# Patient Record
Sex: Male | Born: 1940 | Race: Black or African American | Hispanic: No | Smoking: Former smoker
Health system: Southern US, Community
[De-identification: ages and names within clinical notes are randomized; demographics above are authoritative.]

## PROBLEM LIST (undated history)

## (undated) DIAGNOSIS — R0789 Other chest pain: Secondary | ICD-10-CM

## (undated) DIAGNOSIS — I2510 Atherosclerotic heart disease of native coronary artery without angina pectoris: Secondary | ICD-10-CM

## (undated) DIAGNOSIS — J439 Emphysema, unspecified: Secondary | ICD-10-CM

## (undated) DIAGNOSIS — T7840XA Allergy, unspecified, initial encounter: Secondary | ICD-10-CM

## (undated) DIAGNOSIS — Z92241 Personal history of systemic steroid therapy: Secondary | ICD-10-CM

## (undated) DIAGNOSIS — E785 Hyperlipidemia, unspecified: Secondary | ICD-10-CM

## (undated) DIAGNOSIS — M069 Rheumatoid arthritis, unspecified: Secondary | ICD-10-CM

## (undated) DIAGNOSIS — R002 Palpitations: Secondary | ICD-10-CM

## (undated) DIAGNOSIS — Z5189 Encounter for other specified aftercare: Secondary | ICD-10-CM

## (undated) DIAGNOSIS — M1990 Unspecified osteoarthritis, unspecified site: Secondary | ICD-10-CM

## (undated) DIAGNOSIS — U071 COVID-19: Secondary | ICD-10-CM

## (undated) DIAGNOSIS — D649 Anemia, unspecified: Secondary | ICD-10-CM

## (undated) DIAGNOSIS — K219 Gastro-esophageal reflux disease without esophagitis: Secondary | ICD-10-CM

## (undated) DIAGNOSIS — R0989 Other specified symptoms and signs involving the circulatory and respiratory systems: Secondary | ICD-10-CM

## (undated) DIAGNOSIS — I5189 Other ill-defined heart diseases: Secondary | ICD-10-CM

## (undated) HISTORY — DX: Other specified symptoms and signs involving the circulatory and respiratory systems: R09.89

## (undated) HISTORY — DX: Encounter for other specified aftercare: Z51.89

## (undated) HISTORY — DX: Palpitations: R00.2

## (undated) HISTORY — PX: CARPAL TUNNEL RELEASE: SHX101

## (undated) HISTORY — PX: CATARACT EXTRACTION: SUR2

## (undated) HISTORY — DX: Atherosclerotic heart disease of native coronary artery without angina pectoris: I25.10

## (undated) HISTORY — DX: Anemia, unspecified: D64.9

## (undated) HISTORY — DX: Emphysema, unspecified: J43.9

## (undated) HISTORY — DX: Allergy, unspecified, initial encounter: T78.40XA

## (undated) HISTORY — PX: SPINE SURGERY: SHX786

## (undated) HISTORY — DX: Other ill-defined heart diseases: I51.89

## (undated) HISTORY — PX: POLYPECTOMY: SHX149

## (undated) HISTORY — DX: Personal history of systemic steroid therapy: Z92.241

## (undated) HISTORY — PX: HERNIA REPAIR: SHX51

## (undated) HISTORY — PX: COLOSTOMY REVERSAL: SHX5782

## (undated) HISTORY — PX: ANTERIOR CERVICAL CORPECTOMY: SHX1159

## (undated) HISTORY — PX: COLONOSCOPY: SHX174

## (undated) HISTORY — DX: Unspecified osteoarthritis, unspecified site: M19.90

## (undated) HISTORY — DX: Gastro-esophageal reflux disease without esophagitis: K21.9

## (undated) HISTORY — DX: Rheumatoid arthritis, unspecified: M06.9

## (undated) HISTORY — DX: COVID-19: U07.1

## (undated) HISTORY — DX: Hyperlipidemia, unspecified: E78.5

## (undated) HISTORY — DX: Other chest pain: R07.89

---

## 2014-04-08 DIAGNOSIS — L918 Other hypertrophic disorders of the skin: Secondary | ICD-10-CM | POA: Diagnosis not present

## 2014-05-25 DIAGNOSIS — M79609 Pain in unspecified limb: Secondary | ICD-10-CM | POA: Diagnosis not present

## 2014-05-25 DIAGNOSIS — M25519 Pain in unspecified shoulder: Secondary | ICD-10-CM | POA: Diagnosis not present

## 2014-05-25 DIAGNOSIS — R059 Cough, unspecified: Secondary | ICD-10-CM | POA: Diagnosis not present

## 2014-05-25 DIAGNOSIS — R05 Cough: Secondary | ICD-10-CM | POA: Diagnosis not present

## 2014-05-25 DIAGNOSIS — I10 Essential (primary) hypertension: Secondary | ICD-10-CM | POA: Diagnosis not present

## 2014-06-07 DIAGNOSIS — R05 Cough: Secondary | ICD-10-CM | POA: Diagnosis not present

## 2014-06-07 DIAGNOSIS — R059 Cough, unspecified: Secondary | ICD-10-CM | POA: Diagnosis not present

## 2014-06-07 DIAGNOSIS — M79609 Pain in unspecified limb: Secondary | ICD-10-CM | POA: Diagnosis not present

## 2014-06-07 DIAGNOSIS — I10 Essential (primary) hypertension: Secondary | ICD-10-CM | POA: Diagnosis not present

## 2014-06-07 DIAGNOSIS — M25519 Pain in unspecified shoulder: Secondary | ICD-10-CM | POA: Diagnosis not present

## 2014-06-10 DIAGNOSIS — IMO0002 Reserved for concepts with insufficient information to code with codable children: Secondary | ICD-10-CM | POA: Diagnosis not present

## 2014-06-10 DIAGNOSIS — S71009A Unspecified open wound, unspecified hip, initial encounter: Secondary | ICD-10-CM | POA: Diagnosis not present

## 2014-06-21 DIAGNOSIS — Z7401 Bed confinement status: Secondary | ICD-10-CM | POA: Diagnosis not present

## 2014-06-21 DIAGNOSIS — K9403 Colostomy malfunction: Secondary | ICD-10-CM | POA: Diagnosis not present

## 2014-06-21 DIAGNOSIS — K9413 Enterostomy malfunction: Secondary | ICD-10-CM | POA: Diagnosis not present

## 2014-06-22 DIAGNOSIS — M79609 Pain in unspecified limb: Secondary | ICD-10-CM | POA: Diagnosis not present

## 2014-09-16 DIAGNOSIS — Z9181 History of falling: Secondary | ICD-10-CM | POA: Diagnosis not present

## 2014-09-16 DIAGNOSIS — M5000 Cervical disc disorder with myelopathy, unspecified cervical region: Secondary | ICD-10-CM | POA: Diagnosis not present

## 2014-09-16 DIAGNOSIS — R2689 Other abnormalities of gait and mobility: Secondary | ICD-10-CM | POA: Diagnosis not present

## 2014-09-16 DIAGNOSIS — A047 Enterocolitis due to Clostridium difficile: Secondary | ICD-10-CM | POA: Diagnosis not present

## 2014-09-16 DIAGNOSIS — Z4889 Encounter for other specified surgical aftercare: Secondary | ICD-10-CM | POA: Diagnosis not present

## 2014-09-16 DIAGNOSIS — Z433 Encounter for attention to colostomy: Secondary | ICD-10-CM | POA: Diagnosis not present

## 2014-09-21 DIAGNOSIS — R2689 Other abnormalities of gait and mobility: Secondary | ICD-10-CM | POA: Diagnosis not present

## 2014-09-21 DIAGNOSIS — M5000 Cervical disc disorder with myelopathy, unspecified cervical region: Secondary | ICD-10-CM | POA: Diagnosis not present

## 2014-09-21 DIAGNOSIS — Z4889 Encounter for other specified surgical aftercare: Secondary | ICD-10-CM | POA: Diagnosis not present

## 2014-09-21 DIAGNOSIS — A047 Enterocolitis due to Clostridium difficile: Secondary | ICD-10-CM | POA: Diagnosis not present

## 2014-09-21 DIAGNOSIS — Z433 Encounter for attention to colostomy: Secondary | ICD-10-CM | POA: Diagnosis not present

## 2014-09-21 DIAGNOSIS — Z9181 History of falling: Secondary | ICD-10-CM | POA: Diagnosis not present

## 2014-09-28 DIAGNOSIS — Z433 Encounter for attention to colostomy: Secondary | ICD-10-CM | POA: Diagnosis not present

## 2014-09-28 DIAGNOSIS — Z4889 Encounter for other specified surgical aftercare: Secondary | ICD-10-CM | POA: Diagnosis not present

## 2014-09-28 DIAGNOSIS — R2689 Other abnormalities of gait and mobility: Secondary | ICD-10-CM | POA: Diagnosis not present

## 2014-09-28 DIAGNOSIS — M5000 Cervical disc disorder with myelopathy, unspecified cervical region: Secondary | ICD-10-CM | POA: Diagnosis not present

## 2014-09-28 DIAGNOSIS — Z9181 History of falling: Secondary | ICD-10-CM | POA: Diagnosis not present

## 2014-09-28 DIAGNOSIS — A047 Enterocolitis due to Clostridium difficile: Secondary | ICD-10-CM | POA: Diagnosis not present

## 2014-09-30 DIAGNOSIS — A047 Enterocolitis due to Clostridium difficile: Secondary | ICD-10-CM | POA: Diagnosis not present

## 2014-09-30 DIAGNOSIS — Z9181 History of falling: Secondary | ICD-10-CM | POA: Diagnosis not present

## 2014-09-30 DIAGNOSIS — R2689 Other abnormalities of gait and mobility: Secondary | ICD-10-CM | POA: Diagnosis not present

## 2014-09-30 DIAGNOSIS — Z4889 Encounter for other specified surgical aftercare: Secondary | ICD-10-CM | POA: Diagnosis not present

## 2014-09-30 DIAGNOSIS — Z433 Encounter for attention to colostomy: Secondary | ICD-10-CM | POA: Diagnosis not present

## 2014-09-30 DIAGNOSIS — M5000 Cervical disc disorder with myelopathy, unspecified cervical region: Secondary | ICD-10-CM | POA: Diagnosis not present

## 2014-10-07 DIAGNOSIS — M4800 Spinal stenosis, site unspecified: Secondary | ICD-10-CM | POA: Diagnosis not present

## 2014-10-07 DIAGNOSIS — Z933 Colostomy status: Secondary | ICD-10-CM | POA: Diagnosis not present

## 2014-10-07 DIAGNOSIS — Z1211 Encounter for screening for malignant neoplasm of colon: Secondary | ICD-10-CM | POA: Diagnosis not present

## 2014-10-07 DIAGNOSIS — K621 Rectal polyp: Secondary | ICD-10-CM | POA: Diagnosis not present

## 2014-10-07 DIAGNOSIS — Z01818 Encounter for other preprocedural examination: Secondary | ICD-10-CM | POA: Diagnosis not present

## 2014-10-07 DIAGNOSIS — K625 Hemorrhage of anus and rectum: Secondary | ICD-10-CM | POA: Diagnosis not present

## 2014-10-07 DIAGNOSIS — K5730 Diverticulosis of large intestine without perforation or abscess without bleeding: Secondary | ICD-10-CM | POA: Diagnosis not present

## 2014-10-07 DIAGNOSIS — K5790 Diverticulosis of intestine, part unspecified, without perforation or abscess without bleeding: Secondary | ICD-10-CM | POA: Diagnosis not present

## 2014-10-07 LAB — HM COLONOSCOPY: HM Colonoscopy: NORMAL

## 2014-10-20 DIAGNOSIS — K550 Acute vascular disorders of intestine: Secondary | ICD-10-CM | POA: Diagnosis not present

## 2014-10-27 DIAGNOSIS — K5730 Diverticulosis of large intestine without perforation or abscess without bleeding: Secondary | ICD-10-CM | POA: Diagnosis not present

## 2014-10-27 DIAGNOSIS — Z23 Encounter for immunization: Secondary | ICD-10-CM | POA: Diagnosis not present

## 2014-10-27 DIAGNOSIS — Z433 Encounter for attention to colostomy: Secondary | ICD-10-CM | POA: Diagnosis not present

## 2014-10-27 DIAGNOSIS — D638 Anemia in other chronic diseases classified elsewhere: Secondary | ICD-10-CM | POA: Diagnosis present

## 2014-10-27 DIAGNOSIS — Z87891 Personal history of nicotine dependence: Secondary | ICD-10-CM | POA: Diagnosis not present

## 2014-10-27 DIAGNOSIS — M1990 Unspecified osteoarthritis, unspecified site: Secondary | ICD-10-CM | POA: Diagnosis present

## 2014-10-27 DIAGNOSIS — K9403 Colostomy malfunction: Secondary | ICD-10-CM | POA: Diagnosis not present

## 2014-10-27 DIAGNOSIS — G8918 Other acute postprocedural pain: Secondary | ICD-10-CM | POA: Diagnosis not present

## 2014-11-09 DIAGNOSIS — G8220 Paraplegia, unspecified: Secondary | ICD-10-CM | POA: Diagnosis not present

## 2014-11-09 DIAGNOSIS — M4712 Other spondylosis with myelopathy, cervical region: Secondary | ICD-10-CM | POA: Diagnosis not present

## 2014-11-09 DIAGNOSIS — M4802 Spinal stenosis, cervical region: Secondary | ICD-10-CM | POA: Diagnosis not present

## 2014-11-09 DIAGNOSIS — G959 Disease of spinal cord, unspecified: Secondary | ICD-10-CM | POA: Diagnosis not present

## 2014-12-19 DIAGNOSIS — M79642 Pain in left hand: Secondary | ICD-10-CM | POA: Diagnosis not present

## 2014-12-19 DIAGNOSIS — R030 Elevated blood-pressure reading, without diagnosis of hypertension: Secondary | ICD-10-CM | POA: Diagnosis not present

## 2014-12-19 DIAGNOSIS — M79641 Pain in right hand: Secondary | ICD-10-CM | POA: Diagnosis not present

## 2014-12-19 DIAGNOSIS — M25512 Pain in left shoulder: Secondary | ICD-10-CM | POA: Diagnosis not present

## 2014-12-23 HISTORY — PX: ANTERIOR CERVICAL CORPECTOMY: SHX1159

## 2015-01-20 DIAGNOSIS — M4712 Other spondylosis with myelopathy, cervical region: Secondary | ICD-10-CM | POA: Diagnosis not present

## 2015-01-20 DIAGNOSIS — M4802 Spinal stenosis, cervical region: Secondary | ICD-10-CM | POA: Diagnosis not present

## 2015-01-20 DIAGNOSIS — G8220 Paraplegia, unspecified: Secondary | ICD-10-CM | POA: Diagnosis not present

## 2015-01-20 DIAGNOSIS — G959 Disease of spinal cord, unspecified: Secondary | ICD-10-CM | POA: Diagnosis not present

## 2015-08-10 ENCOUNTER — Emergency Department (HOSPITAL_COMMUNITY)
Admission: EM | Admit: 2015-08-10 | Discharge: 2015-08-10 | Disposition: A | Discharge status: Home / Self Care | Payer: No Typology Code available for payment source | Attending: Emergency Medicine | Admitting: Emergency Medicine

## 2015-08-10 ENCOUNTER — Encounter (HOSPITAL_COMMUNITY): Payer: Self-pay

## 2015-08-10 DIAGNOSIS — Y998 Other external cause status: Secondary | ICD-10-CM | POA: Insufficient documentation

## 2015-08-10 DIAGNOSIS — Y92410 Unspecified street and highway as the place of occurrence of the external cause: Secondary | ICD-10-CM | POA: Insufficient documentation

## 2015-08-10 DIAGNOSIS — M25512 Pain in left shoulder: Secondary | ICD-10-CM | POA: Insufficient documentation

## 2015-08-10 DIAGNOSIS — Y9389 Activity, other specified: Secondary | ICD-10-CM | POA: Insufficient documentation

## 2015-08-10 DIAGNOSIS — Z87891 Personal history of nicotine dependence: Secondary | ICD-10-CM | POA: Insufficient documentation

## 2015-08-10 MED ORDER — ACETAMINOPHEN 500 MG PO TABS: 500.0000 mg | ORAL_TABLET | Freq: Four times a day (QID) | ORAL | Status: DC | PRN

## 2015-08-10 MED ORDER — ACETAMINOPHEN 325 MG PO TABS
650.0000 mg | ORAL_TABLET | Freq: Once | ORAL | Status: AC
  Administered 2015-08-10: 650 mg via ORAL
  Filled 2015-08-10: qty 2
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
## 2015-08-10 NOTE — Discharge Instructions (Signed)

## 2015-10-26 DIAGNOSIS — Z23 Encounter for immunization: Secondary | ICD-10-CM | POA: Diagnosis not present

## 2015-11-09 ENCOUNTER — Ambulatory Visit: Payer: Self-pay | Admitting: Medical

## 2015-11-14 ENCOUNTER — Ambulatory Visit: Payer: Self-pay | Admitting: Medical

## 2015-11-29 ENCOUNTER — Encounter: Payer: Self-pay | Admitting: Family Medicine

## 2015-11-29 ENCOUNTER — Ambulatory Visit (INDEPENDENT_AMBULATORY_CARE_PROVIDER_SITE_OTHER): Payer: Medicare Other | Admitting: Family Medicine

## 2015-11-29 VITALS — BP 138/80 | HR 68 | Ht 62.25 in | Wt 147.4 lb

## 2015-11-29 DIAGNOSIS — N529 Male erectile dysfunction, unspecified: Secondary | ICD-10-CM

## 2015-11-29 DIAGNOSIS — R350 Frequency of micturition: Secondary | ICD-10-CM

## 2015-11-29 DIAGNOSIS — Z7189 Other specified counseling: Secondary | ICD-10-CM

## 2015-11-29 DIAGNOSIS — Z7689 Persons encountering health services in other specified circumstances: Secondary | ICD-10-CM

## 2015-11-29 LAB — POCT URINALYSIS DIPSTICK
Bilirubin, UA: NEGATIVE
Blood, UA: NEGATIVE
Glucose, UA: NEGATIVE
Ketones, UA: NEGATIVE
Leukocytes, UA: NEGATIVE
Nitrite, UA: NEGATIVE
Spec Grav, UA: 1.025
Urobilinogen, UA: NEGATIVE
pH, UA: 6

## 2015-11-29 NOTE — Progress Notes (Signed)
   Subjective:    Patient ID: Tony Mcmillan, male    DOB: 1941/01/12, 74 y.o.   MRN: [id number]  HPI Chief Complaint  Patient presents with  . new pt    new pt get established   He is here today to establish primary care. States he moved here from [place] 6 months. Has not had primary care provider in several years and no physical exam or labs in years. He has had recent cervical surgery in past year and is ambulating with a cane. 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 He then had a colostomy reversal. Denies any problems with his bowels. He did not bring medical records regarding this. Retired from [employer] last year. He lives alone. Drove himself here.   Concerns/complaints today: denies any concerns. After going through ROS he endorses that he urinates "a lot" 2-3 times per night for at least 2 years, no hesitency, urgency, normal stream, emptiess bladder ok. Has not gotten an erection in a few years. Denies history of prostate issues but states has not been checked that he knows of.   Denies fever, chills, loss, fatigue, headaches, dizziness, chest pain, DOE.   Health Maintenance: Colonoscopy: states he had last year and was normal, no medical records.  PSA: unknown.  Eyes: wears eyeglasses, had laser eye surgery. 2 years, overdue.  Dentist: upper and lower dentures- need a dentist Hearing good per patient  Former smoker, denies alcohol or drug use.   Immunizations: flu shot last month Pneumonia: had one last year but not sure which one he got.  Tdap: unknown Zostavax: never  Reviewed allergies, medications, past medical, surgical and social history.   Review of Systems Pertinent positives and negatives in the history of present illness.    Objective:   Physical Exam BP 138/80 mmHg  Pulse 68  Ht 5' 2.25" (1.581 m)  Wt 147 lb 6.4 oz (66.86 kg)  BMI 26.75 kg/m2 Alert, oriented, in no acute  distress. Not otherwise examined.   Urinalysis dipstick trace protein otherwise normal     Assessment & Plan:  Urinary frequency - Plan: POCT urinalysis dipstick  Encounter to establish care  Erectile dysfunction, unspecified erectile dysfunction type  Reviewed orthopedic medical records regarding cervical surgery. No other medical records available. Recommend that he return for a med check plus and annual wellness visit. He is outside the window for [place]. Discussed that he needs a physical exam and fasting labs.  He will need prescription for Zostavax and Tdap at next appointment.  Unsure which pneumonia shot he had last year.

## 2015-12-05 ENCOUNTER — Ambulatory Visit (INDEPENDENT_AMBULATORY_CARE_PROVIDER_SITE_OTHER): Payer: Medicare Other | Admitting: Family Medicine

## 2015-12-05 ENCOUNTER — Encounter: Payer: Self-pay | Admitting: Family Medicine

## 2015-12-05 VITALS — BP 118/70 | HR 72 | Ht 62.0 in | Wt 147.6 lb

## 2015-12-05 DIAGNOSIS — Z125 Encounter for screening for malignant neoplasm of prostate: Secondary | ICD-10-CM

## 2015-12-05 DIAGNOSIS — Z9889 Other specified postprocedural states: Secondary | ICD-10-CM | POA: Diagnosis not present

## 2015-12-05 DIAGNOSIS — N509 Disorder of male genital organs, unspecified: Secondary | ICD-10-CM | POA: Diagnosis not present

## 2015-12-05 DIAGNOSIS — Z0000 Encounter for general adult medical examination without abnormal findings: Secondary | ICD-10-CM | POA: Diagnosis not present

## 2015-12-05 DIAGNOSIS — K5900 Constipation, unspecified: Secondary | ICD-10-CM | POA: Diagnosis not present

## 2015-12-05 DIAGNOSIS — R05 Cough: Secondary | ICD-10-CM

## 2015-12-05 DIAGNOSIS — R195 Other fecal abnormalities: Secondary | ICD-10-CM | POA: Diagnosis not present

## 2015-12-05 DIAGNOSIS — R350 Frequency of micturition: Secondary | ICD-10-CM

## 2015-12-05 DIAGNOSIS — R5383 Other fatigue: Secondary | ICD-10-CM | POA: Diagnosis not present

## 2015-12-05 DIAGNOSIS — J069 Acute upper respiratory infection, unspecified: Secondary | ICD-10-CM | POA: Diagnosis not present

## 2015-12-05 DIAGNOSIS — IMO0002 Reserved for concepts with insufficient information to code with codable children: Secondary | ICD-10-CM

## 2015-12-05 DIAGNOSIS — R059 Cough, unspecified: Secondary | ICD-10-CM

## 2015-12-05 DIAGNOSIS — Z23 Encounter for immunization: Secondary | ICD-10-CM

## 2015-12-05 LAB — CBC WITH DIFFERENTIAL/PLATELET
Basophils Absolute: 0.1 10*3/uL (ref 0.0–0.1)
Basophils Relative: 1 % (ref 0–1)
Eosinophils Absolute: 0.1 10*3/uL (ref 0.0–0.7)
Eosinophils Relative: 2 % (ref 0–5)
HCT: 37.9 % — ABNORMAL LOW (ref 39.0–52.0)
Hemoglobin: 12.4 g/dL — ABNORMAL LOW (ref 13.0–17.0)
Lymphocytes Relative: 21 % (ref 12–46)
Lymphs Abs: 1.1 10*3/uL (ref 0.7–4.0)
MCH: 23.8 pg — ABNORMAL LOW (ref 26.0–34.0)
MCHC: 32.7 g/dL (ref 30.0–36.0)
MCV: 72.7 fL — ABNORMAL LOW (ref 78.0–100.0)
MPV: 9.1 fL (ref 8.6–12.4)
Monocytes Absolute: 0.7 10*3/uL (ref 0.1–1.0)
Monocytes Relative: 13 % — ABNORMAL HIGH (ref 3–12)
Neutro Abs: 3.4 10*3/uL (ref 1.7–7.7)
Neutrophils Relative %: 63 % (ref 43–77)
Platelets: 351 10*3/uL (ref 150–400)
RBC: 5.21 MIL/uL (ref 4.22–5.81)
RDW: 18.2 % — ABNORMAL HIGH (ref 11.5–15.5)
WBC: 5.4 10*3/uL (ref 4.0–10.5)

## 2015-12-05 LAB — COMPREHENSIVE METABOLIC PANEL
ALT: 27 U/L (ref 9–46)
AST: 31 U/L (ref 10–35)
Albumin: 3.9 g/dL (ref 3.6–5.1)
Alkaline Phosphatase: 73 U/L (ref 40–115)
BUN: 11 mg/dL (ref 7–25)
CO2: 21 mmol/L (ref 20–31)
Calcium: 9.2 mg/dL (ref 8.6–10.3)
Chloride: 104 mmol/L (ref 98–110)
Creat: 1.04 mg/dL (ref 0.70–1.18)
Glucose, Bld: 86 mg/dL (ref 65–99)
Potassium: 4.7 mmol/L (ref 3.5–5.3)
Sodium: 137 mmol/L (ref 135–146)
Total Bilirubin: 0.6 mg/dL (ref 0.2–1.2)
Total Protein: 7.7 g/dL (ref 6.1–8.1)

## 2015-12-05 LAB — TSH: TSH: 1.78 u[IU]/mL (ref 0.350–4.500)

## 2015-12-05 NOTE — Progress Notes (Signed)
Tony Mcmillan is a 74 y.o. male who presents for annual wellness visit and follow-up on chronic medical conditions.  He moved here from [place] about 5 months ago. He has the following concerns: cold like symptoms for past 4 days ago Reports dry cough, sinus congestion and runny nose and some fatigue. Denies fever, chills, headache, dizziness, shortness of breath, body aches, ear pain, sore throat.  Reports eating and drinking well, and sleeps well, approximately 7 hours per night.  States he has intermittent constipation, difficulty with getting stool to exit, last bowel movement this morning. Denies blood or pus in stool.   Reports urinary frequency for past 8 months, nocturia 2-3 times per night and 3-4 times during day. No stream problems, sometimes he does not feel like he is emptying his bladder.    Immunization History  Administered Date(s) Administered  . Influenza-Unspecified 10/30/2015  . Pneumococcal Conjugate-13 12/05/2015    Last colonoscopy: 2016 and normal per patient. Performed in [place]. Need records.  Last PSA: unknown Dentist: "years", has upper and lower full plates dentures, need a dentist.  Ophtho: "about a year" laser surgery for cataract removal 2015, needs eye doctor Exercise: not really doing any. Might join [employer]  Other doctors caring for patient include: NONE   Depression screen:  See questionnaire below.    Depression screen PHQ 2/9 12/05/2015  Decreased Interest 0  Down, Depressed, Hopeless 0  PHQ - 2 Score 0    Fall Screen: See Questionaire below.  Fall Risk  12/05/2015  Falls in the past year? No     ADL screen:  See questionnaire below. Functional Status Survey: Is the patient deaf or have difficulty hearing?: No Does the patient have difficulty seeing, even when wearing glasses/contacts?: Yes Does the patient have difficulty concentrating, remembering, or making decisions?: No Does the patient have difficulty walking or climbing stairs?: Yes  (uses cane) Does the patient have difficulty dressing or bathing?: No Does the patient have difficulty doing errands alone such as visiting a doctor's office or shopping?: No   End of Life Discussion:  Patient does not have a living will and medical power of attorney, information requested  Review of Systems Constitutional: -fever, -chills, -sweats, -unexpected weight change, -anorexia, +fatigue Dermatology: denies changing moles, rash, new worrisome lesions ENT: +runny nose, -ear pain, -sore throat, -hoarseness, +sinus pain, -teeth pain, -tinnitus, -hearing loss, -epistaxis Cardiology:  -chest pain, -palpitations, -edema, -orthopnea, -paroxysmal nocturnal dyspnea Respiratory: -cough, -shortness of breath, -dyspnea on exertion, -wheezing, -hemoptysis Gastroenterology: -abdominal pain, -nausea, -vomiting, -diarrhea, +constipation, -blood in stool, -changes in bowel movement, -dysphagia Musculoskeletal: -arthralgias, -myalgias, -joint swelling, -back pain, -neck pain, -gait changes Ophthalmology: +vision changes, states he thinks he needs new glasses, -eye redness, -itching, -discharge Urology: -dysuria, -difficulty urinating, -hematuria, -+urinary frequency, -urgency, incontinence Neurology: -headache, -weakness, -tingling, -numbness, -speech abnormality, -memory loss, -falls, -dizziness Psychology:  -depressed mood, -agitation, -sleep problems  PHYSICAL EXAM:  BP 118/70 mmHg  Pulse 72  Ht 5\' 2"  (1.575 m)  Wt 147 lb 9.6 oz (66.951 kg)  BMI 26.99 kg/m2    General Appearance:    Alert, cooperative, no distress, appears stated age  Head:    Normocephalic, without obvious abnormality, atraumatic  Eyes:    PERRL, conjunctiva/corneas clear, EOM's intact, fundi    benign  Ears:    Normal TM's and external ear canals  Nose:   Nares red, mucosa normal, no drainage or sinus   tenderness  Throat:   Lips, mucosa, and tongue normal; teeth  and gums normal  Neck:   Supple, no lymphadenopathy;   thyroid:  no   enlargement/tenderness/nodules; no carotid   bruit or JVD, well-healed scar noted to cervical region  Back:    Spine nontender, no curvature, ROM normal, no CVA     tenderness  Lungs:     Clear to auscultation bilaterally without wheezes, rales or     ronchi; respirations unlabored  Chest Wall:    No tenderness or deformity   Heart:    Regular rate and rhythm, S1 and S2 normal, no murmur, rub   or gallop  Breast Exam:    No chest wall tenderness, masses or gynecomastia  Abdomen:     Soft, non-tender, nondistended, normoactive bowel sounds,    no masses, no hepatosplenomegaly, well healed scar to RLQ  Genitalia:    Fullness to suprapubic area, right side greater than left, no masses felt. Normal male external genitalia without lesions.  Testicles difficult to assess, cord felt in left testicle, testes appeared to be retracted.  No inguinal hernias.  Rectal:    Normal sphincter tone, no masses or tenderness; guaiac positive stool.  Prostate smooth, no nodules, somewhat enlarged.  Extremities:   No clubbing, cyanosis or edema  Pulses:   2+ and symmetric all extremities  Skin:   Skin color, texture, turgor normal, no rashes or lesions, skin graft noted over buttocks and skin graft scarring to right upper anterior thigh.   Lymph nodes:   Cervical, supraclavicular, and axillary nodes normal  Neurologic:   CNII-XII intact, normal strength, sensation and gait; reflexes 2+ and symmetric throughout          Psych:   Normal mood, affect, hygiene and grooming.     ASSESSMENT/PLAN:  Acute upper respiratory infection  Encounter for Medicare annual wellness exam  Screening for prostate cancer - Plan: PSA, Medicare  Need for Streptococcus pneumoniae vaccination - Plan: Pneumococcal conjugate vaccine 13-valent IM  Increased urinary frequency - Plan: PSA, Medicare, Ambulatory referral to Urology  Cough - Plan: CBC with Differential/Platelet  Other fatigue - Plan: CBC with  Differential/Platelet, Comprehensive metabolic panel, TSH  Constipation, unspecified constipation type - Plan: Ambulatory referral to Gastroenterology  Positive fecal occult blood test - Plan: Ambulatory referral to Gastroenterology  History of colostomy - Plan: Ambulatory referral to Gastroenterology  Disorder of testes - Plan: Ambulatory referral to Urology   His upper respiratory symptoms and cough appear to be related to a viral etiology. Recommend symptomatic treatment and he will let me know if he is not improving in the next 2-3 days.  Also suspect that his mild fatigue is related to his virus , unknown history of thyroid dysfunction. Will order TSH.  Constipation-  Discussed that he should try to stay well-hydrated and eat foods higher in fiber content.  Also discussed that exercise , walking at least 10-15 minutes per day would be beneficial for his overall health and could improve his constipation. Since he has a history of colon surgery and colostomy with reversal and he is occult positive, recommend referral to gastroenterology.   Discussed that I recommend referral to urologist for his urinary frequency , nocturia, and abnormal testicle examination. Dr. Redmond School also examined this patient's genitalia in conjunction with this provider and agreed with plan to refer.    end-of-life counseling mentioned and written information provided.   Patient will call and schedule eye exam  Discussed PSA screening (risks/benefits), recommended at least 30 minutes of aerobic activity at least 5 days/week;  proper sunscreen use reviewed; healthy diet and alcohol recommendations (less than or equal to 2 drinks/day) reviewed; regular seatbelt use; changing batteries in smoke detectors. Immunization recommendations discussed.  Colonoscopy recommendations reviewed.  Prescription given for Tdap and Zostavax. Prevnar 13 given in office today. No records on file.    Medicare Attestation I have personally  reviewed: The patient's medical and social history Their use of alcohol, tobacco or illicit drugs Their current medications and supplements The patient's functional ability including ADLs,fall risks, home safety risks, cognitive, and hearing and visual impairment Diet and physical activities Evidence for depression or mood disorders  The patient's weight, height, and BMI have been recorded in the chart.  I have made referrals, counseling, and provided education to the patient based on review of the above and I have provided the patient with a written personalized care plan for preventive services.     Harland Dingwall, NP   12/05/2015

## 2015-12-05 NOTE — Patient Instructions (Addendum)
Suspect that your cough and cold symptoms are related to a virus and that you should start to improve in the next 3-4 days. Stay well hydrated and let me know if you're not getting better. We will call you with your urology appointment date and time.  The gastroenterologist office will call you to set up appointment.  We will call you with your lab results when they are available. You received your pneumonia 13 vaccine today.   Preventative Care for Adults, Male       REGULAR HEALTH EXAMS:  A routine yearly physical is a good way to check in with your primary care provider about your health and preventive screening. It is also an opportunity to share updates about your health and any concerns you have, and receive a thorough all-over exam.   Most health insurance companies pay for at least some preventative services.  Check with your health plan for specific coverages.  WHAT PREVENTATIVE SERVICES DO MEN NEED?  Adult men should have their weight and blood pressure checked regularly.   Men age 72 and older should have their cholesterol levels checked regularly.  Beginning at age 71 and continuing to age 28, men should be screened for colorectal cancer.  Certain people should may need continued testing until age 46.  Other cancer screening may include exams for testicular and prostate cancer.  Updating vaccinations is part of preventative care.  Vaccinations help protect against diseases such as the flu.  Lab tests are generally done as part of preventative care to screen for anemia and blood disorders, to screen for problems with the kidneys and liver, to screen for bladder problems, to check blood sugar, and to check your cholesterol level.  Preventative services generally include counseling about diet, exercise, avoiding tobacco, drugs, excessive alcohol consumption, and sexually transmitted infections.    GENERAL RECOMMENDATIONS FOR GOOD HEALTH:  Healthy diet:  Eat a variety of  foods, including fruit, vegetables, animal or vegetable protein, such as meat, fish, chicken, and eggs, or beans, lentils, tofu, and grains, such as rice.  Drink plenty of water daily.  Decrease saturated fat in the diet, avoid lots of red meat, processed foods, sweets, fast foods, and fried foods.  Exercise:  Aerobic exercise helps maintain good heart health. At least 30-40 minutes of moderate-intensity exercise is recommended. For example, a brisk walk that increases your heart rate and breathing. This should be done on most days of the week.   Find a type of exercise or a variety of exercises that you enjoy so that it becomes a part of your daily life.  Examples are running, walking, swimming, water aerobics, and biking.  For motivation and support, explore group exercise such as aerobic class, spin class, Zumba, Yoga,or  martial arts, etc.    Set exercise goals for yourself, such as a certain weight goal, walk or run in a race such as a 5k walk/run.  Speak to your primary care provider about exercise goals.  Disease prevention:  If you smoke or chew tobacco, find out from your caregiver how to quit. It can literally save your life, no matter how long you have been a tobacco user. If you do not use tobacco, never begin.   Maintain a healthy diet and normal weight. Increased weight leads to problems with blood pressure and diabetes.   The Body Mass Index or BMI is a way of measuring how much of your body is fat. Having a BMI above 27 increases the risk  of heart disease, diabetes, hypertension, stroke and other problems related to obesity. Your caregiver can help determine your BMI and based on it develop an exercise and dietary program to help you achieve or maintain this important measurement at a healthful level.  High blood pressure causes heart and blood vessel problems.  Persistent high blood pressure should be treated with medicine if weight loss and exercise do not work.   Fat and  cholesterol leaves deposits in your arteries that can block them. This causes heart disease and vessel disease elsewhere in your body.  If your cholesterol is found to be high, or if you have heart disease or certain other medical conditions, then you may need to have your cholesterol monitored frequently and be treated with medication.   Ask if you should have a stress test if your history suggests this. A stress test is a test done on a treadmill that looks for heart disease. This test can find disease prior to there being a problem.  Avoid drinking alcohol in excess (more than two drinks per day).  Avoid use of street drugs. Do not share needles with anyone. Ask for professional help if you need assistance or instructions on stopping the use of alcohol, cigarettes, and/or drugs.  Brush your teeth twice a day with fluoride toothpaste, and floss once a day. Good oral hygiene prevents tooth decay and gum disease. The problems can be painful, unattractive, and can cause other health problems. Visit your dentist for a routine oral and dental check up and preventive care every 6-12 months.   Look at your skin regularly.  Use a mirror to look at your back. Notify your caregivers of changes in moles, especially if there are changes in shapes, colors, a size larger than a pencil eraser, an irregular border, or development of new moles.  Safety:  Use seatbelts 100% of the time, whether driving or as a passenger.  Use safety devices such as hearing protection if you work in environments with loud noise or significant background noise.  Use safety glasses when doing any work that could send debris in to the eyes.  Use a helmet if you ride a bike or motorcycle.  Use appropriate safety gear for contact sports.  Talk to your caregiver about gun safety.  Use sunscreen with a SPF (or skin protection factor) of 15 or greater.  Lighter skinned people are at a greater risk of skin cancer. Don't forget to also wear  sunglasses in order to protect your eyes from too much damaging sunlight. Damaging sunlight can accelerate cataract formation.   Practice safe sex. Use condoms. Condoms are used for birth control and to help reduce the spread of sexually transmitted infections (or STIs).  Some of the STIs are gonorrhea (the clap), chlamydia, syphilis, trichomonas, herpes, HPV (human papilloma virus) and HIV (human immunodeficiency virus) which causes AIDS. The herpes, HIV and HPV are viral illnesses that have no cure. These can result in disability, cancer and death.   Keep carbon monoxide and smoke detectors in your home functioning at all times. Change the batteries every 6 months or use a model that plugs into the wall.   Vaccinations:  Stay up to date with your tetanus shots and other required immunizations. You should have a booster for tetanus every 10 years. Be sure to get your flu shot every year, since 5%-20% of the [place] population comes down with the flu. The flu vaccine changes each year, so being vaccinated once is not  enough. Get your shot in the fall, before the flu season peaks.   Other vaccines to consider:  Pneumococcal vaccine to protect against certain types of pneumonia.  This is normally recommended for adults age 62 or older.  However, adults younger than 73 years old with certain underlying conditions such as diabetes, heart or lung disease should also receive the vaccine.  Shingles vaccine to protect against Varicella Zoster if you are older than age 49, or younger than 73 years old with certain underlying illness.  Hepatitis A vaccine to protect against a form of infection of the liver by a virus acquired from food.  Hepatitis B vaccine to protect against a form of infection of the liver by a virus acquired from blood or body fluids, particularly if you work in health care.  If you plan to travel internationally, check with your local health department for specific vaccination  recommendations.  Cancer Screening:  Most routine colon cancer screening begins at the age of 60. On a yearly basis, doctors may provide special easy to use take-home tests to check for hidden blood in the stool. Sigmoidoscopy or colonoscopy can detect the earliest forms of colon cancer and is life saving. These tests use a small camera at the end of a tube to directly examine the colon. Speak to your caregiver about this at age 60, when routine screening begins (and is repeated every 5 years unless early forms of pre-cancerous polyps or small growths are found).   At the age of 38 men usually start screening for prostate cancer every year. Screening may begin at a younger age for those with higher risk. Those at higher risk include African-Americans or having a family history of prostate cancer. There are two types of tests for prostate cancer:   Prostate-specific antigen (PSA) testing. Recent studies raise questions about prostate cancer using PSA and you should discuss this with your caregiver.   Digital rectal exam (in which your doctor's lubricated and gloved finger feels for enlargement of the prostate through the anus).   Screening for testicular cancer.  Do a monthly exam of your testicles. Gently roll each testicle between your thumb and fingers, feeling for any abnormal lumps. The best time to do this is after a hot shower or bath when the tissues are looser. Notify your caregivers of any lumps, tenderness or changes in size or shape immediately.

## 2015-12-05 NOTE — Progress Notes (Deleted)
Tony Mcmillan is a 74 y.o. male who presents for annual wellness visit and follow-up on chronic medical conditions.  He has the following concerns:   Immunization History  Administered Date(s) Administered  . Influenza-Unspecified 10/30/2015   Last colonoscopy: Last PSA: Dentist: Ophtho: Exercise:  Other doctors caring for patient include:   Depression screen:  See questionnaire in chart.  Notable *** ADL screen:  See questionnaire in chart.  Notable ***  End of Life Discussion:  Patient {ACTIONS; HAS/DOES NOT HAVE:19233} a living will and medical power of attorney   The patient denies anorexia, fever, weight changes, headaches,  vision loss, decreased hearing, ear pain, hoarseness, chest pain, palpitations, dizziness, syncope, dyspnea on exertion, cough, swelling, nausea, vomiting, diarrhea, constipation, abdominal pain, melena, hematochezia, indigestion/heartburn, hematuria, incontinence, erectile dysfunction, nocturia, weakened urine stream, dysuria, genital lesions, joint pains, numbness, tingling, weakness, tremor, suspicious skin lesions, depression, anxiety, abnormal bleeding/bruising, or enlarged lymph nodes  PHYSICAL EXAM:  There were no vitals taken for this visit.  ***   ASSESSMENT/PLAN:   Discussed PSA screening (risks/benefits), recommended at least 30 minutes of aerobic activity at least 5 days/week; proper sunscreen use reviewed; healthy diet and alcohol recommendations (less than or equal to 2 drinks/day) reviewed; regular seatbelt use; changing batteries in smoke detectors. Immunization recommendations discussed.  Colonoscopy recommendations reviewed.   Medicare Attestation I have personally reviewed: The patient's medical and social history Their use of alcohol, tobacco or illicit drugs Their current medications and supplements The patient's functional ability including ADLs,fall risks, home safety risks, cognitive, and hearing and visual impairment Diet  and physical activities Evidence for depression or mood disorders  The patient's weight, height, and BMI have been recorded in the chart.  I have made referrals, counseling, and provided education to the patient based on review of the above and I have provided the patient with a written personalized care plan for preventive services.     Harland Dingwall, NP   12/05/2015

## 2015-12-06 LAB — PSA, MEDICARE: PSA: 1.37 ng/mL (ref <=4.00)

## 2015-12-11 ENCOUNTER — Encounter: Payer: Self-pay | Admitting: Internal Medicine

## 2015-12-12 ENCOUNTER — Other Ambulatory Visit (INDEPENDENT_AMBULATORY_CARE_PROVIDER_SITE_OTHER): Payer: Medicare Other

## 2015-12-12 ENCOUNTER — Ambulatory Visit (INDEPENDENT_AMBULATORY_CARE_PROVIDER_SITE_OTHER): Payer: Medicare Other | Admitting: Gastroenterology

## 2015-12-12 ENCOUNTER — Encounter: Payer: Self-pay | Admitting: Gastroenterology

## 2015-12-12 VITALS — BP 120/70 | HR 92 | Ht 62.0 in | Wt 148.0 lb

## 2015-12-12 DIAGNOSIS — D509 Iron deficiency anemia, unspecified: Secondary | ICD-10-CM

## 2015-12-12 DIAGNOSIS — R195 Other fecal abnormalities: Secondary | ICD-10-CM | POA: Diagnosis not present

## 2015-12-12 LAB — FERRITIN: Ferritin: 18.9 ng/mL — ABNORMAL LOW (ref 22.0–322.0)

## 2015-12-12 LAB — IBC PANEL
Iron: 30 ug/dL — ABNORMAL LOW (ref 42–165)
Saturation Ratios: 7.2 % — ABNORMAL LOW (ref 20.0–50.0)
Transferrin: 298 mg/dL (ref 212.0–360.0)

## 2015-12-12 LAB — VITAMIN B12: Vitamin B-12: 871 pg/mL (ref 211–911)

## 2015-12-12 LAB — FOLATE: Folate: 17.2 ng/mL (ref >5.9)

## 2015-12-12 NOTE — Progress Notes (Signed)
    History of Present Illness: This is a 74 year old male referred by Girtha Rm, NP for the evaluation of heme + stool and microcytic anemia. Patient relates he had a diverting colostomy placed for healing of the wound in [place] 2015. He states he underwent colonoscopy in [place] in March 2016 prior to colostomy reversal which was performed in April 2016. Relocated to [place] a few months afterwards. He has no gastrointestinal complaints. He was found to have a microcytic anemia  And Hemoccult positive stool on DRE. Denies weight loss, abdominal pain, constipation, diarrhea, change in stool caliber, melena, hematochezia, nausea, vomiting, dysphagia, reflux symptoms, chest pain.  Review of Systems: Pertinent positive and negative review of systems were noted in the above HPI section. All other review of systems were otherwise negative.  Current Medications, Allergies, Past Medical History, Past Surgical History, Family History and Social History were reviewed in [employer] record.  Physical Exam: General: Well developed, well nourished, no acute distress Head: Normocephalic and atraumatic Eyes:  sclerae anicteric, EOMI Ears: Normal auditory acuity Mouth: No deformity or lesions Neck: Supple, no masses or thyromegaly Lungs: Clear throughout to auscultation Heart: Regular rate and rhythm; no murmurs, rubs or bruits Abdomen: Soft, non tender and non distended. No masses, hepatosplenomegaly or hernias noted. Normal Bowel sounds Rectal: deferred with recent DRE showing no lesions and heme + stool by report Musculoskeletal: Symmetrical with no gross deformities  Skin: No lesions on visible extremities Pulses:  Normal pulses noted Extremities: No clubbing, cyanosis, edema or deformities noted Neurological: Alert oriented x 4, grossly nonfocal Cervical Nodes:  No significant cervical adenopathy Inguinal Nodes: No significant inguinal adenopathy Psychological:  Alert  and cooperative. Normal mood and affect  Assessment and Recommendations:  1. Occult blood in stool and microcytic anemia. Rule out iron deficiency anemia. Send iron, iron binding, ferritin, B12 and folate. Attempt to obtain records from his colonoscopy and his colostomy reversal performed earlier this year in [place]. Schedule EGD to further evaluate source of occult gastrointestinal bleeding. If we are able to obtain his colonoscopy report and it appears to be a complete exam with a good bowel prep then repeat colonoscopy will likely not be necessary.  cc: Girtha Rm, NP [place], [place] [id number]

## 2015-12-12 NOTE — Patient Instructions (Signed)
Your physician has requested that you go to the basement for lab work before leaving today.  You have been scheduled for an endoscopy. Please follow written instructions given to you at your visit today. If you use inhalers (even only as needed), please bring them with you on the day of your procedure. Your physician has requested that you go to www.startemmi.com and enter the access code given to you at your visit today. This web site gives a general overview about your procedure. However, you should still follow specific instructions given to you by our office regarding your preparation for the procedure.  Thank you for choosing me and [facility].  Pricilla Riffle. Dagoberto Ligas., MD., [facility]

## 2015-12-13 ENCOUNTER — Other Ambulatory Visit: Payer: Self-pay | Admitting: *Deleted

## 2015-12-13 MED ORDER — FERROUS SULFATE 324 (65 FE) MG PO TBEC: DELAYED_RELEASE_TABLET | ORAL | Status: DC

## 2015-12-28 ENCOUNTER — Encounter: Payer: Self-pay | Admitting: Internal Medicine

## 2016-01-04 DIAGNOSIS — H2513 Age-related nuclear cataract, bilateral: Secondary | ICD-10-CM | POA: Diagnosis not present

## 2016-01-04 DIAGNOSIS — Z961 Presence of intraocular lens: Secondary | ICD-10-CM | POA: Diagnosis not present

## 2016-01-09 ENCOUNTER — Ambulatory Visit (AMBULATORY_SURGERY_CENTER): Payer: Medicare Other | Admitting: Gastroenterology

## 2016-01-09 ENCOUNTER — Encounter: Payer: Self-pay | Admitting: Gastroenterology

## 2016-01-09 VITALS — BP 136/80 | HR 69 | Temp 96.0°F | Resp 19 | Ht 62.0 in | Wt 148.0 lb

## 2016-01-09 DIAGNOSIS — D509 Iron deficiency anemia, unspecified: Secondary | ICD-10-CM | POA: Diagnosis not present

## 2016-01-09 DIAGNOSIS — E669 Obesity, unspecified: Secondary | ICD-10-CM | POA: Diagnosis not present

## 2016-01-09 DIAGNOSIS — R195 Other fecal abnormalities: Secondary | ICD-10-CM | POA: Diagnosis not present

## 2016-01-09 DIAGNOSIS — K259 Gastric ulcer, unspecified as acute or chronic, without hemorrhage or perforation: Secondary | ICD-10-CM | POA: Diagnosis not present

## 2016-01-09 DIAGNOSIS — K3189 Other diseases of stomach and duodenum: Secondary | ICD-10-CM | POA: Diagnosis not present

## 2016-01-09 MED ORDER — OMEPRAZOLE 20 MG PO CPDR: 20.0000 mg | DELAYED_RELEASE_CAPSULE | Freq: Every day | ORAL | Status: DC

## 2016-01-09 MED ORDER — SODIUM CHLORIDE 0.9 % IV SOLN: 500.0000 mL | INTRAVENOUS | Status: DC

## 2016-01-09 NOTE — Patient Instructions (Signed)
YOU HAD AN ENDOSCOPIC PROCEDURE TODAY AT [facility]:   Refer to the procedure report that was given to you for any specific questions about what was found during the examination.  If the procedure report does not answer your questions, please call your gastroenterologist to clarify.  If you requested that your care partner not be given the details of your procedure findings, then the procedure report has been included in a sealed envelope for you to review at your convenience later.  YOU SHOULD EXPECT: Some feelings of bloating in the abdomen. Passage of more gas than usual.  Walking can help get rid of the air that was put into your GI tract during the procedure and reduce the bloating. If you had a lower endoscopy (such as a colonoscopy or flexible sigmoidoscopy) you may notice spotting of blood in your stool or on the toilet paper. If you underwent a bowel prep for your procedure, you may not have a normal bowel movement for a few days.  Please Note:  You might notice some irritation and congestion in your nose or some drainage.  This is from the oxygen used during your procedure.  There is no need for concern and it should clear up in a day or so.  SYMPTOMS TO REPORT IMMEDIATELY:     Following upper endoscopy (EGD)  Vomiting of blood or coffee ground material  New chest pain or pain under the shoulder blades  Painful or persistently difficult swallowing  New shortness of breath  Fever of 100F or higher  Black, tarry-looking stools  For urgent or emergent issues, a gastroenterologist can be reached at any hour by calling [phone].   DIET: Your first meal following the procedure should be a small meal and then it is ok to progress to your normal diet. Heavy or fried foods are harder to digest and may make you feel nauseous or bloated.  Likewise, meals heavy in dairy and vegetables can increase bloating.  Drink plenty of fluids but you should avoid alcoholic beverages  for 24 hours.  ACTIVITY:  You should plan to take it easy for the rest of today and you should NOT DRIVE or use heavy machinery until tomorrow (because of the sedation medicines used during the test).    FOLLOW UP: Our staff will call the number listed on your records the next business day following your procedure to check on you and address any questions or concerns that you may have regarding the information given to you following your procedure. If we do not reach you, we will leave a message.  However, if you are feeling well and you are not experiencing any problems, there is no need to return our call.  We will assume that you have returned to your regular daily activities without incident.  If any biopsies were taken you will be contacted by phone or by letter within the next 1-3 weeks.  Please call us at [phone] if you have not heard about the biopsies in 3 weeks.    SIGNATURES/CONFIDENTIALITY: You and/or your care partner have signed paperwork which will be entered into your electronic medical record.  These signatures attest to the fact that that the information above on your After Visit Summary has been reviewed and is understood.  Full responsibility of the confidentiality of this discharge information lies with you and/or your care-partner.  Resume medications. Information given on esophagitis, gastritis and hiatal hernia. Avoid Nsaids.

## 2016-01-09 NOTE — Progress Notes (Signed)
Called to room to assist during endoscopic procedure.  Patient ID and intended procedure confirmed with present staff. Received instructions for my participation in the procedure from the performing physician.  

## 2016-01-09 NOTE — Op Note (Signed)
[place]  [address] [place], [address]   ENDOSCOPY PROCEDURE REPORT  PATIENT: Tony, Mcmillan  MR#: [id number] BIRTHDATE: 10/12/1941 , 19  yrs. old GENDER: male ENDOSCOPIST: Ladene Artist, MD, [facility] REFERRED BY:  Harland Dingwall, NP PROCEDURE DATE:  01/09/2016 PROCEDURE:  EGD w/ biopsy ASA CLASS:     Class II INDICATIONS:  unexplained iron deficiency anemia and heme positive stool. MEDICATIONS: Monitored anesthesia care and Propofol 100 mg IV TOPICAL ANESTHETIC: none DESCRIPTION OF PROCEDURE: After the risks benefits and alternatives of the procedure were thoroughly explained, informed consent was obtained.  The [id number] endoscope was introduced through the mouth and advanced to the second portion of the duodenum , Without limitations.  The instrument was slowly withdrawn as the mucosa was fully examined.   ESOPHAGUS: There was LA Class B esophagitis (One or more mucosal breaks > 39mm, but without continuity across mucosal folds) noted. The esophagus was otherwise unremarkable. STOMACH: Gastritis was found in the gastric body and gastric antrum. Multiple biopsies were performed.   The stomach otherwise appeared normal. DUODENUM: A single non-bleeding non-bleeding, clean-based and round ulcer, ranging between 3-5 mm in size, was found in the duodenal bulb.   The duodenal mucosa showed no abnormalities in the 2nd part of the duodenum.  Retroflexed views revealed a small hiatal hernia. The scope was then withdrawn from the patient and the procedure completed.  COMPLICATIONS: There were no immediate complications.  ENDOSCOPIC IMPRESSION: 1.   LA Class B esophagitis 2.   Gastritis in the gastric body and gastric antrum; multiple biopsies performed 3.   Small hiatal hernia 4.   Single non-bleeding ulcer in the duodenal bulb  RECOMMENDATIONS: 1.  Await pathology results 2.  Anti-reflux regimen long term 3.  Avoid NSAIDS 4.  PPI qam:  omeprazole 20 mg po qam, 1 yr of refills 5.  Awaiting records from recent colonoscopy performed in [place]  eSigned:  Ladene Artist, MD, [facility] 01/09/2016 10:21 AM

## 2016-01-09 NOTE — Progress Notes (Signed)
Report to PACU, RN, vss, BBS= Clear.  

## 2016-01-10 ENCOUNTER — Telehealth: Payer: Self-pay

## 2016-01-10 NOTE — Telephone Encounter (Signed)
  Follow up Call-  Call back number 01/09/2016  Post procedure Call Back phone  # [phone]  Permission to leave phone message Yes     Patient questions:  Do you have a fever, pain , or abdominal swelling? No. Pain Score  0 *  Have you tolerated food without any problems? Yes.    Have you been able to return to your normal activities? Yes.    Do you have any questions about your discharge instructions: Diet   No. Medications  No. Follow up visit  No.  Do you have questions or concerns about your Care? No.  Actions: * If pain score is 4 or above: No action needed, pain <4.  No problems per the pt. maw

## 2016-01-18 ENCOUNTER — Encounter: Payer: Self-pay | Admitting: Gastroenterology

## 2016-01-24 DIAGNOSIS — D126 Benign neoplasm of colon, unspecified: Secondary | ICD-10-CM

## 2016-01-24 HISTORY — DX: Benign neoplasm of colon, unspecified: D12.6

## 2016-01-25 ENCOUNTER — Telehealth: Payer: Self-pay | Admitting: Gastroenterology

## 2016-01-25 NOTE — Telephone Encounter (Signed)
All questions about pathology letter answered.  He will call back for any additional questions or concerns.  

## 2016-01-29 DIAGNOSIS — R3121 Asymptomatic microscopic hematuria: Secondary | ICD-10-CM | POA: Diagnosis not present

## 2016-01-29 DIAGNOSIS — Z0000 Encounter for general adult medical examination without abnormal findings: Secondary | ICD-10-CM | POA: Diagnosis not present

## 2016-01-29 DIAGNOSIS — N509 Disorder of male genital organs, unspecified: Secondary | ICD-10-CM | POA: Diagnosis not present

## 2016-01-29 DIAGNOSIS — N400 Enlarged prostate without lower urinary tract symptoms: Secondary | ICD-10-CM | POA: Diagnosis not present

## 2016-02-03 ENCOUNTER — Other Ambulatory Visit: Payer: Self-pay | Admitting: Gastroenterology

## 2016-02-08 DIAGNOSIS — J439 Emphysema, unspecified: Secondary | ICD-10-CM

## 2016-02-08 DIAGNOSIS — R3129 Other microscopic hematuria: Secondary | ICD-10-CM | POA: Diagnosis not present

## 2016-02-08 DIAGNOSIS — R3121 Asymptomatic microscopic hematuria: Secondary | ICD-10-CM | POA: Diagnosis not present

## 2016-02-08 HISTORY — DX: Emphysema, unspecified: J43.9

## 2016-02-12 ENCOUNTER — Ambulatory Visit (AMBULATORY_SURGERY_CENTER): Payer: Self-pay

## 2016-02-12 VITALS — Ht 62.0 in | Wt 149.0 lb

## 2016-02-12 DIAGNOSIS — R195 Other fecal abnormalities: Secondary | ICD-10-CM

## 2016-02-12 DIAGNOSIS — D509 Iron deficiency anemia, unspecified: Secondary | ICD-10-CM

## 2016-02-12 MED ORDER — NA SULFATE-K SULFATE-MG SULF 17.5-3.13-1.6 GM/177ML PO SOLN: 1.0000 | Freq: Once | ORAL | Status: DC

## 2016-02-12 NOTE — Progress Notes (Signed)
No egg or soy allergies Not on home 02 No previous anesthesia complications No diet or weight loss meds Emmi video sent to [email or web address]

## 2016-02-14 DIAGNOSIS — Z0000 Encounter for general adult medical examination without abnormal findings: Secondary | ICD-10-CM | POA: Diagnosis not present

## 2016-02-14 DIAGNOSIS — N400 Enlarged prostate without lower urinary tract symptoms: Secondary | ICD-10-CM | POA: Diagnosis not present

## 2016-02-14 DIAGNOSIS — N281 Cyst of kidney, acquired: Secondary | ICD-10-CM | POA: Diagnosis not present

## 2016-02-14 DIAGNOSIS — K4090 Unilateral inguinal hernia, without obstruction or gangrene, not specified as recurrent: Secondary | ICD-10-CM | POA: Diagnosis not present

## 2016-02-14 DIAGNOSIS — R3121 Asymptomatic microscopic hematuria: Secondary | ICD-10-CM | POA: Diagnosis not present

## 2016-02-20 ENCOUNTER — Ambulatory Visit (AMBULATORY_SURGERY_CENTER): Payer: Medicare Other | Admitting: Gastroenterology

## 2016-02-20 ENCOUNTER — Encounter: Payer: Self-pay | Admitting: Gastroenterology

## 2016-02-20 VITALS — BP 133/74 | HR 84 | Temp 100.4°F | Resp 28 | Ht 62.0 in | Wt 149.0 lb

## 2016-02-20 DIAGNOSIS — D123 Benign neoplasm of transverse colon: Secondary | ICD-10-CM

## 2016-02-20 DIAGNOSIS — K219 Gastro-esophageal reflux disease without esophagitis: Secondary | ICD-10-CM | POA: Diagnosis not present

## 2016-02-20 DIAGNOSIS — D509 Iron deficiency anemia, unspecified: Secondary | ICD-10-CM | POA: Diagnosis not present

## 2016-02-20 DIAGNOSIS — R195 Other fecal abnormalities: Secondary | ICD-10-CM

## 2016-02-20 DIAGNOSIS — D122 Benign neoplasm of ascending colon: Secondary | ICD-10-CM | POA: Diagnosis not present

## 2016-02-20 DIAGNOSIS — E669 Obesity, unspecified: Secondary | ICD-10-CM | POA: Diagnosis not present

## 2016-02-20 MED ORDER — SODIUM CHLORIDE 0.9 % IV SOLN
500.0000 mL | INTRAVENOUS | Status: DC
Start: 2016-02-20 — End: 2016-02-20

## 2016-02-20 NOTE — Op Note (Signed)
[facility]  [address] [place], [address]   COLONOSCOPY PROCEDURE REPORT PATIENT: Tony Mcmillan, Tony Mcmillan  MR#: [id number] BIRTHDATE: 02-Feb-1941 , 8  yrs. old GENDER: male ENDOSCOPIST: Ladene Artist, MD, [facility] REFERRED BY: Harland Dingwall, NP PROCEDURE DATE:  02/20/2016 PROCEDURE:   Colonoscopy, diagnostic and Colonoscopy with snare polypectomy First Screening Colonoscopy - Avg.  risk and is 50 yrs.  old or older - No.  Prior Negative Screening - Now for repeat screening. N/A  History of Adenoma - Now for follow-up colonoscopy & has been > or = to 3 yrs.  N/A  Polyps removed today? Yes ASA CLASS:   Class II INDICATIONS:Unexplained iron deficiency anemia and Evaluation of unexplained GI bleeding.  Heme positive stool. MEDICATIONS: Monitored anesthesia care and Propofol 100 mg IV DESCRIPTION OF PROCEDURE:   After the risks benefits and alternatives of the procedure were thoroughly explained, informed consent was obtained.  The digital rectal exam revealed no abnormalities of the rectum.   The LB [id number]-H190 [id number] and LB 1528 endoscope was introduced through the anus and advanced to the cecum, which was identified by both the appendix and ileocecal valve. No adverse events experienced.   The quality of the prep was good.  (Suprep was used)  The instrument was then slowly withdrawn as the colon was fully examined. Estimated blood loss is zero unless otherwise noted in this procedure report.  COLON FINDINGS: Two sessile polyps measuring 7 mm in size were found in the transverse colon and ascending colon.  Polypectomies were performed with a cold snare.  The resection was complete, the polyp tissue was completely retrieved and sent to histology.   There was moderate diverticulosis noted in the descending colon, transverse colon, and ascending colon.   There was evidence of a normal appearing prior surgical anastomosis in the left colon.   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   The examination was otherwise normal.  Retroflexed views revealed internal Grade I hemorrhoids. The time to cecum = 0.8 Withdrawal time = 10.1   The scope was withdrawn and the procedure completed. COMPLICATIONS: There were no immediate complications. ENDOSCOPIC IMPRESSION: 1.   Two sessile polyps in the transverse colon and ascending colon; polypectomies performed with a cold snare 2.   Moderate diverticulosis in the descending colon, transverse colon, and ascending colon 3.   Prior surgical anastomosis in the left colon 4.   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   Grade l internal hemorrhoids  RECOMMENDATIONS: 1.  Await pathology results 2.  High fiber diet with liberal fluid intake. 3.  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 Follow up with PCP/ 3.  Repeat colonoscopy in 5 years if polyp(s) adenomatous; otherwise given your age, you will not need another colonoscopy for colon cancer screening or polyp surveillance.  These types of tests usually stop around the age 23.  eSigned:  Ladene Artist, MD, [facility] 02/20/2016 2:53 PM

## 2016-02-20 NOTE — Progress Notes (Signed)
Called to room to assist during endoscopic procedure.  Patient ID and intended procedure confirmed with present staff. Received instructions for my participation in the procedure from the performing physician.  

## 2016-02-20 NOTE — Patient Instructions (Signed)
YOU HAD AN ENDOSCOPIC PROCEDURE TODAY AT [facility]:   Refer to the procedure report that was given to you for any specific questions about what was found during the examination.  If the procedure report does not answer your questions, please call your gastroenterologist to clarify.  If you requested that your care partner not be given the details of your procedure findings, then the procedure report has been included in a sealed envelope for you to review at your convenience later.  YOU SHOULD EXPECT: Some feelings of bloating in the abdomen. Passage of more gas than usual.  Walking can help get rid of the air that was put into your GI tract during the procedure and reduce the bloating. If you had a lower endoscopy (such as a colonoscopy or flexible sigmoidoscopy) you may notice spotting of blood in your stool or on the toilet paper. If you underwent a bowel prep for your procedure, you may not have a normal bowel movement for a few days.  Please Note:  You might notice some irritation and congestion in your nose or some drainage.  This is from the oxygen used during your procedure.  There is no need for concern and it should clear up in a day or so.  SYMPTOMS TO REPORT IMMEDIATELY:   Following lower endoscopy (colonoscopy or flexible sigmoidoscopy):  Excessive amounts of blood in the stool  Significant tenderness or worsening of abdominal pains  Swelling of the abdomen that is new, acute  Fever of 100F or higher    For urgent or emergent issues, a gastroenterologist can be reached at any hour by calling [phone].   DIET: Your first meal following the procedure should be a small meal and then it is ok to progress to your normal diet. Heavy or fried foods are harder to digest and may make you feel nauseous or bloated.  Likewise, meals heavy in dairy and vegetables can increase bloating.  Drink plenty of fluids but you should avoid alcoholic beverages for 24  hours.  ACTIVITY:  You should plan to take it easy for the rest of today and you should NOT DRIVE or use heavy machinery until tomorrow (because of the sedation medicines used during the test).    FOLLOW UP: Our staff will call the number listed on your records the next business day following your procedure to check on you and address any questions or concerns that you may have regarding the information given to you following your procedure. If we do not reach you, we will leave a message.  However, if you are feeling well and you are not experiencing any problems, there is no need to return our call.  We will assume that you have returned to your regular daily activities without incident.  If any biopsies were taken you will be contacted by phone or by letter within the next 1-3 weeks.  Please call us at [phone] if you have not heard about the biopsies in 3 weeks.    SIGNATURES/CONFIDENTIALITY: You and/or your care partner have signed paperwork which will be entered into your electronic medical record.  These signatures attest to the fact that that the information above on your After Visit Summary has been reviewed and is understood.  Full responsibility of the confidentiality of this discharge information lies with you and/or your care-partner.   Information on polyps and diverticulosis  given to you today  Continue Iron   Follow up with primary care Physician

## 2016-02-20 NOTE — Progress Notes (Signed)
Report to PACU, RN, vss, BBS= Clear.  

## 2016-02-21 ENCOUNTER — Telehealth: Payer: Self-pay | Admitting: *Deleted

## 2016-02-21 NOTE — Telephone Encounter (Signed)
  Follow up Call-  Call back number 02/20/2016 01/09/2016  Post procedure Call Back phone  # [phone]  Permission to leave phone message Yes Yes     Patient questions:  Do you have a fever, pain , or abdominal swelling? No. Pain Score  0 *  Have you tolerated food without any problems? Yes.    Have you been able to return to your normal activities? Yes.    Do you have any questions about your discharge instructions: Diet   No. Medications  No. Follow up visit  No.  Do you have questions or concerns about your Care? No.  Actions: * If pain score is 4 or above: No action needed, pain <4.

## 2016-02-23 ENCOUNTER — Ambulatory Visit: Payer: Self-pay | Admitting: General Surgery

## 2016-02-23 ENCOUNTER — Encounter: Payer: Self-pay | Admitting: Family Medicine

## 2016-02-23 ENCOUNTER — Ambulatory Visit (INDEPENDENT_AMBULATORY_CARE_PROVIDER_SITE_OTHER): Payer: Medicare Other | Admitting: Family Medicine

## 2016-02-23 VITALS — BP 130/80 | HR 69 | Temp 98.1°F | Wt 146.5 lb

## 2016-02-23 DIAGNOSIS — K635 Polyp of colon: Secondary | ICD-10-CM | POA: Diagnosis not present

## 2016-02-23 DIAGNOSIS — K4090 Unilateral inguinal hernia, without obstruction or gangrene, not specified as recurrent: Secondary | ICD-10-CM | POA: Diagnosis not present

## 2016-02-23 DIAGNOSIS — K269 Duodenal ulcer, unspecified as acute or chronic, without hemorrhage or perforation: Secondary | ICD-10-CM | POA: Diagnosis not present

## 2016-02-23 DIAGNOSIS — J069 Acute upper respiratory infection, unspecified: Secondary | ICD-10-CM | POA: Diagnosis not present

## 2016-02-23 HISTORY — DX: Polyp of colon: K63.5

## 2016-02-23 HISTORY — DX: Duodenal ulcer, unspecified as acute or chronic, without hemorrhage or perforation: K26.9

## 2016-02-23 LAB — CBC WITH DIFFERENTIAL/PLATELET
Basophils Absolute: 0 10*3/uL (ref 0.0–0.1)
Basophils Relative: 0 % (ref 0–1)
Eosinophils Absolute: 0.2 10*3/uL (ref 0.0–0.7)
Eosinophils Relative: 2 % (ref 0–5)
HCT: 40.1 % (ref 39.0–52.0)
Hemoglobin: 13.1 g/dL (ref 13.0–17.0)
Lymphocytes Relative: 16 % (ref 12–46)
Lymphs Abs: 1.4 10*3/uL (ref 0.7–4.0)
MCH: 25.3 pg — ABNORMAL LOW (ref 26.0–34.0)
MCHC: 32.7 g/dL (ref 30.0–36.0)
MCV: 77.4 fL — ABNORMAL LOW (ref 78.0–100.0)
MPV: 9.3 fL (ref 8.6–12.4)
Monocytes Absolute: 0.9 10*3/uL (ref 0.1–1.0)
Monocytes Relative: 10 % (ref 3–12)
Neutro Abs: 6.3 10*3/uL (ref 1.7–7.7)
Neutrophils Relative %: 72 % (ref 43–77)
Platelets: 335 10*3/uL (ref 150–400)
RBC: 5.18 MIL/uL (ref 4.22–5.81)
RDW: 19.8 % — ABNORMAL HIGH (ref 11.5–15.5)
WBC: 8.7 10*3/uL (ref 4.0–10.5)

## 2016-02-23 NOTE — Progress Notes (Signed)
   Subjective:    Patient ID: Tony Mcmillan, male    DOB: [birth year], 75 y.o.   MRN: [id number]  HPI He complains of a five-day history of chest congestion, rhinorrhea, nasal congestion and PND. No sneezing, sore throat, earache, fever or chills. Review of his record also indicates he had endoscopic evidence of gastric ulcer. Presently he is taking Prilosec. He does not complain of abdominal pain or melena.He also recently had a colonoscopy which did show a polyp over the pathology is not back yet. He is also taking ferrous sulfate.   Review of Systems     Objective:   Physical Exam Alert and in no distress. Tympanic membranes and canals are normal. Pharyngeal area is normal. Neck is supple without adenopathy or thyromegaly. Cardiac exam shows a regular sinus rhythm without murmurs or gallops. Lungs are clear to auscultation.        Assessment & Plan:  URI, acute  Duodenal ulcer - Plan: CBC with Differential/Platelet  Colonic polyp treat his URI symptoms conservatively. I will check a CBC to make sure that he is improving.

## 2016-02-23 NOTE — Patient Instructions (Signed)
Use Robitussin-DM during the day and NyQuil at night for the coughing. Try Claritin or Allegra to help dry you up

## 2016-02-23 NOTE — H&P (Signed)
History of Present Illness Tony Spruce MD; 02/23/2016 10:09 AM) Patient words: hernia.  The patient is a 75 year old male who presents with an inguinal hernia. Referred by Dr. Pilar Jarvis. 75 year old male with hernia known for several years. Hernia is not causing specific pain issues, he is not complaining of nausea or vomiting, is not constipation, he notices it as a bulge and often has to push on it to reduce it. He has never had a time where it was stuck where he was unable to reduce it. 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  He does not smoke, he is not diabetic, and he does not have any known connective tissue diseases.   Other Problems Marjean Donna, CMA; 02/23/2016 9:05 AM) Arthritis Gastric Ulcer Gastroesophageal Reflux Disease Inguinal Hernia  Past Surgical History Marjean Donna, CMA; 02/23/2016 9:05 AM) Colon Polyp Removal - Colonoscopy Spinal Surgery - Neck  Diagnostic Studies History Marjean Donna, CMA; 02/23/2016 9:05 AM) Colonoscopy within last year  Allergies Davy Pique Bynum, CMA; 02/23/2016 9:06 AM) No Known Drug Allergies03/02/2016  Medication History (Sonya Bynum, CMA; 02/23/2016 9:06 AM) Omeprazole (20MG  Capsule DR, Oral) Active. Ferrous Sulfate (325 (65 Fe)MG Tablet, Oral) Active. Medications Reconciled  Social History Marjean Donna, CMA; 02/23/2016 9:05 AM) Alcohol use Remotely quit alcohol use. Caffeine use Carbonated beverages, Coffee, Tea. No drug use Tobacco use Former smoker.  Family History Marjean Donna, [place]; 02/23/2016 9:05 AM) Cerebrovascular Accident Brother. Diabetes Mellitus Brother, Father.    Review of Systems Davy Pique Bynum CMA; 02/23/2016 9:05 AM) General Not Present- Appetite Loss, Chills, Fatigue, Fever, Night Sweats, Weight Gain and Weight Loss. Skin Not Present- Change in Wart/Mole, Dryness, Hives, Jaundice, New Lesions, Non-Healing Wounds, Rash and  Ulcer. HEENT Present- Wears glasses/contact lenses. Not Present- Earache, Hearing Loss, Hoarseness, Nose Bleed, Oral Ulcers, Ringing in the Ears, Seasonal Allergies, Sinus Pain, Sore Throat, Visual Disturbances and Yellow Eyes. Respiratory Not Present- Bloody sputum, Chronic Cough, Difficulty Breathing, Snoring and Wheezing. Cardiovascular Not Present- Chest Pain, Difficulty Breathing Lying Down, Leg Cramps, Palpitations, Rapid Heart Rate, Shortness of Breath and Swelling of Extremities. Male Genitourinary Present- Nocturia. Not Present- Blood in Urine, Change in Urinary Stream, Frequency, Impotence, Painful Urination, Urgency and Urine Leakage. Musculoskeletal Not Present- Back Pain, Joint Pain, Joint Stiffness, Muscle Pain, Muscle Weakness and Swelling of Extremities. Neurological Not Present- Decreased Memory, Fainting, Headaches, Numbness, Seizures, Tingling, Tremor, Trouble walking and Weakness. Psychiatric Not Present- Anxiety, Bipolar, Change in Sleep Pattern, Depression, Fearful and Frequent crying. Endocrine Not Present- Cold Intolerance, Excessive Hunger, Hair Changes, Heat Intolerance, Hot flashes and New Diabetes. Hematology Not Present- Easy Bruising, Excessive bleeding, Gland problems, HIV and Persistent Infections.  Vitals (Sonya Bynum CMA; 02/23/2016 9:06 AM) 02/23/2016 9:05 AM Weight: 147 lb Height: 62in Body Surface Area: 1.68 m Body Mass Index: 26.89 kg/m  Temp.: 97.5F(Temporal)  Pulse: 81 (Regular)  BP: 124/72 (Sitting, Left Arm, Standard)       Physical Exam Tony Spruce MD; 02/23/2016 10:07 AM) General Mental Status-Alert. General Appearance-Cooperative. Orientation-Oriented X4. Build & Nutrition-Obese. Posture-Normal posture.  Integumentary Global Assessment Normal Exam - Head/Face: no rashes, ulcers, lesions or evidence of photo damage. No palpable nodules or masses and Neck: no visible lesions or palpable masses.  Head and  Neck Head-normocephalic, atraumatic with no lesions or palpable masses. Face Global Assessment - atraumatic. Thyroid Gland Characteristics - normal size and consistency.  Eye Eyeball - Bilateral-Extraocular movements intact.  Sclera/Conjunctiva - Bilateral-No scleral icterus, No Discharge.  ENMT Nose and Sinuses Nose - no deformities observed, no swelling present.  Chest and Lung Exam Palpation Normal exam - Non-tender. Auscultation Breath sounds - Normal.  Cardiovascular Auscultation Rhythm - Regular. Heart Sounds - S1 WNL and S2 WNL. Carotid arteries - No Carotid bruit.  Abdomen Inspection Normal Exam - No Visible peristalsis, No Abnormal pulsations and No Paradoxical movements. Palpation/Percussion Normal exam - Soft, Non Tender, No Rebound tenderness, No Rigidity (guarding), No hepatosplenomegaly and No Palpable abdominal masses. Note: Well-healed midline scar. Right inguinal hernia with gurgling on palpation, reducible with manipulation. No palpable left defect.   Male Genitourinary Note: Scar on the posterior side of scrotum with limited mobility of scrotum.   Peripheral Vascular Upper Extremity Palpation - Pulses bilaterally normal. Lower Extremity Palpation - Edema - Bilateral - No edema.  Neurologic Neurologic evaluation reveals -normal sensation and normal coordination.  Neuropsychiatric Mental status exam performed with findings of-able to articulate well with normal speech/language, rate, volume and coherence and thought content normal with ability to perform basic computations and apply abstract reasoning.  Musculoskeletal Normal Exam - Bilateral-Upper Extremity Strength Normal and Lower Extremity Strength Normal.    Assessment & Plan Tony Spruce MD; 02/23/2016 10:08 AM) RIGHT INGUINAL HERNIA (K40.90) Impression: 75 year old male with large right inguinal hernia reducible. We discussed the etiology of hernias as they get bigger  and smaller at they can occasionally get obstructed. I'm concerned since this may not solve his intestine in his hernia sac that he is at higher risk for hernia incarceration. We discussed that the best option for repair. Open repair as he's had previous intra-abdominal surgery and has a large defect. We discussed the specifics of the procedure, that is an outpatient surgery, that he would most likely be completely asleep, that we would dissect down and placed piece of mesh in the inguinal canal. We discussed risks of the procedure as urinary retention, infection, mesh infection, leading issues, injury to nerves, injury to spermatic cord, and low likelihood for recurrence. He showed good understanding to proceed. Current Plans Pt Education - Consent for inguinal hernia - [place]: discussed with patient and provided information. Pt Education - Pamphlet Given - Laparoscopic Hernia Repair: discussed with patient and provided information. You are being scheduled for surgery - Our schedulers will call you.  You should hear from our office's scheduling department within 5 working days about the location, date, and time of surgery. We try to make accommodations for patient's preferences in scheduling surgery, but sometimes the OR schedule or the surgeon's schedule prevents [place] from making those accommodations.  If you have not heard from our office [phone]) in 5 working days, call the office and ask for your surgeon's nurse.  If you have other questions about your diagnosis, plan, or surgery, call the office and ask for your surgeon's nurse.

## 2016-02-26 ENCOUNTER — Encounter: Payer: Self-pay | Admitting: Gastroenterology

## 2016-03-05 ENCOUNTER — Encounter (HOSPITAL_BASED_OUTPATIENT_CLINIC_OR_DEPARTMENT_OTHER): Admission: RE | Payer: Self-pay | Source: Ambulatory Visit

## 2016-03-05 ENCOUNTER — Ambulatory Visit (HOSPITAL_BASED_OUTPATIENT_CLINIC_OR_DEPARTMENT_OTHER): Admission: RE | Admit: 2016-03-05 | Payer: Medicare Other | Source: Ambulatory Visit | Admitting: General Surgery

## 2016-03-05 DIAGNOSIS — K4090 Unilateral inguinal hernia, without obstruction or gangrene, not specified as recurrent: Secondary | ICD-10-CM | POA: Diagnosis not present

## 2016-03-05 DIAGNOSIS — N493 Fournier gangrene: Secondary | ICD-10-CM | POA: Diagnosis not present

## 2016-03-05 SURGERY — REPAIR, HERNIA, INGUINAL, ADULT
Anesthesia: Choice | Laterality: Right

## 2016-03-12 ENCOUNTER — Telehealth: Payer: Self-pay | Admitting: Family Medicine

## 2016-03-12 NOTE — Telephone Encounter (Signed)
Pt is coming in tomorrow for appt

## 2016-03-12 NOTE — Telephone Encounter (Signed)
Pt came in and requested a parking placard. Sending back to be completed. Please call pt at [phone] when ready.

## 2016-03-12 NOTE — Telephone Encounter (Signed)
Patient dropped off application for disability parking today. He will need to be seen for this as I cannot sign off on this based on the information I have. Please call and let him know that he will need to be seen.

## 2016-03-13 ENCOUNTER — Ambulatory Visit (INDEPENDENT_AMBULATORY_CARE_PROVIDER_SITE_OTHER): Payer: Medicare Other | Admitting: Family Medicine

## 2016-03-13 ENCOUNTER — Encounter: Payer: Self-pay | Admitting: Family Medicine

## 2016-03-13 VITALS — BP 116/64 | HR 64 | Wt 150.2 lb

## 2016-03-13 DIAGNOSIS — R269 Unspecified abnormalities of gait and mobility: Secondary | ICD-10-CM | POA: Diagnosis not present

## 2016-03-13 DIAGNOSIS — Z736 Limitation of activities due to disability: Secondary | ICD-10-CM

## 2016-03-13 DIAGNOSIS — Z8639 Personal history of other endocrine, nutritional and metabolic disease: Secondary | ICD-10-CM | POA: Diagnosis not present

## 2016-03-13 NOTE — Progress Notes (Signed)
   Subjective:    Patient ID: Tony Mcmillan, male    DOB: 22-Mar-1941, 75 y.o.   MRN: [id number]  HPI Chief Complaint  Patient presents with  . wants handicap    wants handicap placard. has trouble walking far distance, walks with a cane.    He is here requesting a handicap placard due to the fact that he has difficulty walking since his neck and back surgery in [place]. He ambulates with a cane.  He denies fever, chills, chest pain, DOE, palpitations, dizziness, LE edema, cough, GI or GU issues. States his only concern today is to apply for disability parking.  Since his last visit with me in December he has GI and had polyp removed and she was told to return in 5 years. He also had a visit with the urologist- had prostate examined, BPH.  Had hernia repair with general surgeon last week and will follow up on 03/21/2016.  He also had a eye exam since our last visit and had to get stronger prescription for his glasses.    Review of Systems Pertinent positives and negatives in the history of present illness.     Objective:   Physical Exam BP 116/64 mmHg  Pulse 64  Wt 150 lb 3.2 oz (68.13 kg)  Alert and in no distress.  Cardiac exam shows a regular sinus rhythm without murmurs or gallops. Lungs are clear to auscultation.      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 He has hardware to the cervical region. He has limited mobility, uses a cane, and unable to walk long distances without resting due to pain. He meets criteria for Disability parking. Handicap placard application filled out signed and given back to patient. Discussed that per chart review, he has a history of hyperlipidemia and has taken a statin in the past. At his last appointment and physical he was nonfasting and we were  unable to obtain fasting lipid panel. This will be ordered and addressed as appropriate. Patient is aware that he needs to return for a lab visit. Order for fasting lipid panel is in the chart.

## 2016-03-19 ENCOUNTER — Other Ambulatory Visit: Payer: Medicare Other

## 2016-03-19 DIAGNOSIS — Z8639 Personal history of other endocrine, nutritional and metabolic disease: Secondary | ICD-10-CM

## 2016-03-20 LAB — LIPID PANEL
Cholesterol: 206 mg/dL — ABNORMAL HIGH (ref 125–200)
HDL: 49 mg/dL (ref >=40)
LDL Cholesterol: 115 mg/dL (ref <130)
Total CHOL/HDL Ratio: 4.2 Ratio (ref <=5.0)
Triglycerides: 210 mg/dL — ABNORMAL HIGH (ref <150)
VLDL: 42 mg/dL — ABNORMAL HIGH (ref <30)

## 2016-04-03 ENCOUNTER — Encounter: Payer: Self-pay | Admitting: Family Medicine

## 2016-04-03 DIAGNOSIS — M51369 Other intervertebral disc degeneration, lumbar region without mention of lumbar back pain or lower extremity pain: Secondary | ICD-10-CM

## 2016-04-03 DIAGNOSIS — N281 Cyst of kidney, acquired: Secondary | ICD-10-CM | POA: Insufficient documentation

## 2016-04-03 DIAGNOSIS — R3121 Asymptomatic microscopic hematuria: Secondary | ICD-10-CM | POA: Insufficient documentation

## 2016-04-03 DIAGNOSIS — N400 Enlarged prostate without lower urinary tract symptoms: Secondary | ICD-10-CM | POA: Insufficient documentation

## 2016-04-03 DIAGNOSIS — M47816 Spondylosis without myelopathy or radiculopathy, lumbar region: Secondary | ICD-10-CM | POA: Insufficient documentation

## 2016-04-03 DIAGNOSIS — M5136 Other intervertebral disc degeneration, lumbar region: Secondary | ICD-10-CM | POA: Insufficient documentation

## 2016-04-03 DIAGNOSIS — K4090 Unilateral inguinal hernia, without obstruction or gangrene, not specified as recurrent: Secondary | ICD-10-CM | POA: Insufficient documentation

## 2016-04-03 HISTORY — DX: Other intervertebral disc degeneration, lumbar region without mention of lumbar back pain or lower extremity pain: M51.369

## 2016-04-03 HISTORY — DX: Benign prostatic hyperplasia without lower urinary tract symptoms: N40.0

## 2016-04-03 HISTORY — DX: Asymptomatic microscopic hematuria: R31.21

## 2016-04-03 HISTORY — DX: Cyst of kidney, acquired: N28.1

## 2016-04-03 HISTORY — DX: Other intervertebral disc degeneration, lumbar region: M51.36

## 2016-04-03 HISTORY — DX: Spondylosis without myelopathy or radiculopathy, lumbar region: M47.816

## 2016-04-15 ENCOUNTER — Encounter: Payer: Self-pay | Admitting: Family Medicine

## 2016-04-24 ENCOUNTER — Other Ambulatory Visit: Payer: Self-pay | Admitting: Gastroenterology

## 2016-06-13 ENCOUNTER — Ambulatory Visit: Payer: Medicare Other | Admitting: Family Medicine

## 2016-07-01 ENCOUNTER — Other Ambulatory Visit: Payer: Self-pay | Admitting: Gastroenterology

## 2016-07-02 ENCOUNTER — Encounter: Payer: Self-pay | Admitting: Family Medicine

## 2016-07-02 ENCOUNTER — Ambulatory Visit (INDEPENDENT_AMBULATORY_CARE_PROVIDER_SITE_OTHER): Payer: Medicare Other | Admitting: Family Medicine

## 2016-07-02 ENCOUNTER — Ambulatory Visit
Admission: RE | Admit: 2016-07-02 | Discharge: 2016-07-02 | Disposition: A | Discharge status: Home / Self Care | Payer: Medicare Other | Source: Ambulatory Visit | Attending: Family Medicine | Admitting: Family Medicine

## 2016-07-02 VITALS — BP 130/80 | HR 64 | Wt 149.2 lb

## 2016-07-02 DIAGNOSIS — Z860100 Personal history of colon polyps, unspecified: Secondary | ICD-10-CM

## 2016-07-02 DIAGNOSIS — G8929 Other chronic pain: Secondary | ICD-10-CM | POA: Diagnosis not present

## 2016-07-02 DIAGNOSIS — Z8719 Personal history of other diseases of the digestive system: Secondary | ICD-10-CM | POA: Diagnosis not present

## 2016-07-02 DIAGNOSIS — Z862 Personal history of diseases of the blood and blood-forming organs and certain disorders involving the immune mechanism: Secondary | ICD-10-CM | POA: Insufficient documentation

## 2016-07-02 DIAGNOSIS — M25531 Pain in right wrist: Secondary | ICD-10-CM | POA: Diagnosis not present

## 2016-07-02 DIAGNOSIS — Z8711 Personal history of peptic ulcer disease: Secondary | ICD-10-CM

## 2016-07-02 DIAGNOSIS — M25532 Pain in left wrist: Principal | ICD-10-CM

## 2016-07-02 DIAGNOSIS — M25539 Pain in unspecified wrist: Secondary | ICD-10-CM

## 2016-07-02 HISTORY — DX: Personal history of colon polyps, unspecified: Z86.0100

## 2016-07-02 HISTORY — DX: Personal history of colonic polyps: Z86.010

## 2016-07-02 HISTORY — DX: Other chronic pain: G89.29

## 2016-07-02 HISTORY — DX: Personal history of peptic ulcer disease: Z87.11

## 2016-07-02 LAB — CBC WITH DIFFERENTIAL/PLATELET
Basophils Absolute: 0 cells/uL (ref 0–200)
Basophils Relative: 0 %
Eosinophils Absolute: 102 cells/uL (ref 15–500)
Eosinophils Relative: 2 %
HCT: 41.5 % (ref 38.5–50.0)
Hemoglobin: 14.2 g/dL (ref 13.2–17.1)
Lymphocytes Relative: 26 %
Lymphs Abs: 1326 cells/uL (ref 850–3900)
MCH: 27.7 pg (ref 27.0–33.0)
MCHC: 34.2 g/dL (ref 32.0–36.0)
MCV: 81.1 fL (ref 80.0–100.0)
MPV: 9.3 fL (ref 7.5–12.5)
Monocytes Absolute: 510 cells/uL (ref 200–950)
Monocytes Relative: 10 %
Neutro Abs: 3162 cells/uL (ref 1500–7800)
Neutrophils Relative %: 62 %
Platelets: 306 10*3/uL (ref 140–400)
RBC: 5.12 MIL/uL (ref 4.20–5.80)
RDW: 15.7 % — ABNORMAL HIGH (ref 11.0–15.0)
WBC: 5.1 10*3/uL (ref 4.0–10.5)

## 2016-07-02 NOTE — Progress Notes (Signed)
   Subjective:    Patient ID: Tony Mcmillan, male    DOB: 08-17-1941, 75 y.o.   MRN: [id number]  HPI Chief Complaint  Patient presents with  . follow-up    3 month follow-up. arthitis on wrist   He is here for follow up on anemia and positive hemocult. Review of records shows that he had EGD and colonoscopy in 12/2015 and 01/2016 respectively. He denies any recent blood in stool. Denies fever, chills, night sweats, fatigue, chest pain, DOE, abdominal pain, GI or GU symptoms  Complains of left wrist pain for approximately 1 year but has not had this checked. Denies injury. Pain is a dull ache at rest and and increased pain with movement. Has not been taking anything for this. States he cannot take NSAIDS due to stomach ulcer. Denies any other joint pain or muscle aches.   Reviewed allergies, medications, past medical history.    Review of Systems Pertinent positives and negatives in the history of present illness.     Objective:   Physical Exam  Constitutional: He is oriented to person, place, and time. He appears well-developed and well-nourished. No distress.  HENT:  Mouth/Throat: Oropharynx is clear and moist.  Cardiovascular: Normal rate, regular rhythm, normal heart sounds and intact distal pulses.   Pulmonary/Chest: Effort normal and breath sounds normal.  Musculoskeletal:       Left wrist: He exhibits decreased range of motion, tenderness and swelling.  Left wrist with mild edema, tenderness, pain with flexion, extension, abduction, adduction, supination, pronation. Grip strength equal put painful to left wrist. No erythema, warmth or weakness noted. Normal sensation, pulse, capillary refill.   Neurological: He is alert and oriented to person, place, and time. Coordination normal.  Skin: Skin is warm and dry. No rash noted. No erythema. No pallor.  Psychiatric: He has a normal mood and affect. His behavior is normal. Thought content normal.   BP 130/80 mmHg  Pulse 64  Wt 149 lb  3.2 oz (67.677 kg)'     Assessment & Plan:  Chronic wrist pain, left - Plan: DG Wrist Complete Left  History of anemia - Plan: CBC with Differential/Platelet  History of colonic polyps  History of gastric ulcer  Plan to order XR for wrist pain. NSAIDS are contraindicated due to history of gastritis. Recommend Tylenol for pain. Follow up pending XR. No other joints involved.no systemic symptoms.  History of anemia. Has had colonoscopy recently with 2 polyps removed. Record reviewed. Recommended to follow up for repeat in 5 years.  He will continue on daily iron.  Continue taking omeprazole daily for gastritis and nonbleeding ulcer and avoid NSAIDS and alcohol.  Follow up in 6 months or sooner pending labs.

## 2016-07-02 NOTE — Patient Instructions (Signed)
You can take Tylenol for wrist pain. Will call with your XR result.  You can take Iron over the counter. Petra Kuba made is a good brand. Dose 325mg  (65mg ).

## 2016-07-08 ENCOUNTER — Other Ambulatory Visit: Payer: Self-pay | Admitting: Family Medicine

## 2016-07-08 DIAGNOSIS — M1990 Unspecified osteoarthritis, unspecified site: Secondary | ICD-10-CM

## 2016-07-22 ENCOUNTER — Other Ambulatory Visit: Payer: Self-pay | Admitting: Gastroenterology

## 2016-08-14 DIAGNOSIS — M2550 Pain in unspecified joint: Secondary | ICD-10-CM | POA: Diagnosis not present

## 2016-08-14 DIAGNOSIS — M25552 Pain in left hip: Secondary | ICD-10-CM | POA: Diagnosis not present

## 2016-08-14 DIAGNOSIS — Z113 Encounter for screening for infections with a predominantly sexual mode of transmission: Secondary | ICD-10-CM | POA: Diagnosis not present

## 2016-08-14 DIAGNOSIS — R5381 Other malaise: Secondary | ICD-10-CM | POA: Diagnosis not present

## 2016-08-14 DIAGNOSIS — Z9225 Personal history of immunosupression therapy: Secondary | ICD-10-CM | POA: Diagnosis not present

## 2016-08-14 DIAGNOSIS — M25512 Pain in left shoulder: Secondary | ICD-10-CM | POA: Diagnosis not present

## 2016-08-14 DIAGNOSIS — M79641 Pain in right hand: Secondary | ICD-10-CM | POA: Diagnosis not present

## 2016-08-14 DIAGNOSIS — M79672 Pain in left foot: Secondary | ICD-10-CM | POA: Diagnosis not present

## 2016-08-14 DIAGNOSIS — Z79899 Other long term (current) drug therapy: Secondary | ICD-10-CM | POA: Diagnosis not present

## 2016-08-14 DIAGNOSIS — M79671 Pain in right foot: Secondary | ICD-10-CM | POA: Diagnosis not present

## 2016-09-13 ENCOUNTER — Other Ambulatory Visit (HOSPITAL_COMMUNITY): Payer: Self-pay | Admitting: Rheumatology

## 2016-09-13 ENCOUNTER — Ambulatory Visit (HOSPITAL_COMMUNITY)
Admission: RE | Admit: 2016-09-13 | Discharge: 2016-09-13 | Disposition: A | Discharge status: Home / Self Care | Payer: Medicare Other | Source: Ambulatory Visit | Attending: Rheumatology | Admitting: Rheumatology

## 2016-09-13 DIAGNOSIS — M2550 Pain in unspecified joint: Secondary | ICD-10-CM | POA: Diagnosis not present

## 2016-09-13 DIAGNOSIS — R5381 Other malaise: Secondary | ICD-10-CM | POA: Diagnosis not present

## 2016-09-13 DIAGNOSIS — Z9225 Personal history of immunosupression therapy: Secondary | ICD-10-CM | POA: Diagnosis not present

## 2016-09-13 DIAGNOSIS — R52 Pain, unspecified: Secondary | ICD-10-CM | POA: Diagnosis not present

## 2016-09-13 DIAGNOSIS — M810 Age-related osteoporosis without current pathological fracture: Secondary | ICD-10-CM | POA: Diagnosis not present

## 2016-09-13 DIAGNOSIS — M79641 Pain in right hand: Secondary | ICD-10-CM | POA: Diagnosis not present

## 2016-09-13 DIAGNOSIS — M79672 Pain in left foot: Secondary | ICD-10-CM | POA: Diagnosis not present

## 2016-09-13 DIAGNOSIS — Z5189 Encounter for other specified aftercare: Secondary | ICD-10-CM

## 2016-09-13 DIAGNOSIS — M79642 Pain in left hand: Secondary | ICD-10-CM | POA: Diagnosis not present

## 2016-09-13 DIAGNOSIS — M79671 Pain in right foot: Secondary | ICD-10-CM | POA: Diagnosis not present

## 2016-09-13 DIAGNOSIS — E559 Vitamin D deficiency, unspecified: Secondary | ICD-10-CM | POA: Diagnosis not present

## 2016-09-13 DIAGNOSIS — Z23 Encounter for immunization: Secondary | ICD-10-CM | POA: Diagnosis not present

## 2016-09-23 ENCOUNTER — Encounter: Payer: Self-pay | Admitting: Family Medicine

## 2016-09-23 ENCOUNTER — Encounter (HOSPITAL_COMMUNITY): Payer: Self-pay | Admitting: Emergency Medicine

## 2016-09-23 ENCOUNTER — Emergency Department (HOSPITAL_COMMUNITY): Payer: Medicare Other

## 2016-09-23 ENCOUNTER — Emergency Department (HOSPITAL_COMMUNITY)
Admission: EM | Admit: 2016-09-23 | Discharge: 2016-09-23 | Disposition: A | Discharge status: Home / Self Care | Payer: Medicare Other | Attending: Emergency Medicine | Admitting: Emergency Medicine

## 2016-09-23 ENCOUNTER — Ambulatory Visit (INDEPENDENT_AMBULATORY_CARE_PROVIDER_SITE_OTHER): Payer: Medicare Other | Admitting: Family Medicine

## 2016-09-23 ENCOUNTER — Telehealth: Payer: Self-pay | Admitting: Family Medicine

## 2016-09-23 VITALS — BP 110/60 | HR 92 | Temp 97.8°F | Resp 24 | Wt 154.6 lb

## 2016-09-23 DIAGNOSIS — Z79899 Other long term (current) drug therapy: Secondary | ICD-10-CM

## 2016-09-23 DIAGNOSIS — R0682 Tachypnea, not elsewhere classified: Secondary | ICD-10-CM | POA: Diagnosis not present

## 2016-09-23 DIAGNOSIS — R0602 Shortness of breath: Secondary | ICD-10-CM | POA: Diagnosis not present

## 2016-09-23 DIAGNOSIS — Z862 Personal history of diseases of the blood and blood-forming organs and certain disorders involving the immune mechanism: Secondary | ICD-10-CM

## 2016-09-23 DIAGNOSIS — Z87891 Personal history of nicotine dependence: Secondary | ICD-10-CM | POA: Insufficient documentation

## 2016-09-23 NOTE — Progress Notes (Signed)
Subjective:    Patient ID: Tony Mcmillan, male    DOB: 1941/08/14, 75 y.o.   MRN: [id number]  HPI Chief Complaint  Patient presents with  . shortness of breathe    shortness of breath. started new medicine last week and a couple days later started having SOB   He is here with complaints of shortness of breath for past 2-3 days and states  it worsens when laying down. He reports he has had a dry cough also for 2-3 days.  Rhinorrhea this morning but that has subsided.   States he started taking methotrexate last week for RA and was told if he developed shortness of breath that he should be seen immediately.   Has an appointment with Dr. Estanislado Pandy this coming Friday.   Denies fever, chills, headache, neck pain, chest pain, palpitations, nausea, vomiting, diarrhea.   Former smoker.   Denies leg or calf pain or tenderness. Denies recent bleeding.     Review of Systems Pertinent positives and negatives in the history of present illness.     Objective:   Physical Exam  Constitutional: He is oriented to person, place, and time. He appears well-developed and well-nourished. No distress.  HENT:  Right Ear: Tympanic membrane and ear canal normal.  Left Ear: Tympanic membrane and ear canal normal.  Nose: Nose normal. Right sinus exhibits no maxillary sinus tenderness and no frontal sinus tenderness. Left sinus exhibits no maxillary sinus tenderness and no frontal sinus tenderness.  Mouth/Throat: Uvula is midline, oropharynx is clear and moist and mucous membranes are normal.  Neck: Neck supple. No JVD present.  Cardiovascular: Normal rate and regular rhythm.  Exam reveals no gallop and no friction rub.   No murmur heard. No LE edema  Pulmonary/Chest: Breath sounds normal. Tachypnea noted. No respiratory distress.  Lymphadenopathy:    He has no cervical adenopathy.  Neurological: He is alert and oriented to person, place, and time. He has normal strength. No cranial nerve deficit or  sensory deficit.  Ambulates with a cane  Skin: Skin is warm and dry. No cyanosis. No pallor.  Psychiatric: He has a normal mood and affect. His speech is normal and behavior is normal. Judgment normal. Cognition and memory are normal.   BP 110/60   Pulse 92   Temp 97.8 F (36.6 C) (Oral)   Resp (!) 24   Wt 154 lb 9.6 oz (70.1 kg)   SpO2 97%   BMI 28.28 kg/m       Assessment & Plan:  Shortness of breath - Plan: EKG 12-Lead  Tachypnea - Plan: EKG 12-Lead  High risk medication use  History of anemia  He has taken one dose of methotrexate, last Friday. Symptoms do not appear to be related to toxicity per Dr. Estanislado Pandy. He does not appear to have an acute URI, no obvious explanation for his symptoms.  Patient was ambulated around the office and did not desat.   Called and spoke with Dr. Estanislado Pandy and she recommends that he be sent to the ED for further evaluation of shortness of breath and tachypnea. States people with autoimmune disorders such as this patient are at an increased risk of an acute cardiac event and that he should be evaluated by a cardiologist.  Recent labs done at Dr. Arlean Hopping office were scanned into the chart. ECG performed and scanned. No acute changes noted.  Reviewed Normal chest XR on 09/13/2016. Echocardiogram performed 06/2014 shows EF>55%.  Will follow up in 2 weeks. He will  hold off on next dose of Methotrexate until cardiac workup and until he sees Dr. Estanislado Pandy again.  Patient is going to the [facility] ED.  Called and made triage nurse Jessica aware.

## 2016-09-23 NOTE — ED Triage Notes (Signed)
Pt comes in to ED w/ c/o SOB x3 days. Pt states he was seen at ortho 09/13/16 and started on a new arthritis medication and told if he developed SOB to see doctor. Pt was seen today by PCP which sent him to the ED for further evaluation. Lung sounds clear bilaterally, chest expansion symmetrical. Pt AOx4.

## 2016-09-23 NOTE — Patient Instructions (Signed)
You are being sent to the ED for further evaluation of shortness of breath. Dr. Estanislado Pandy recommends that you have a cardiac workup and see a CARDIOLOGIST.  Please tell them that you started methotrexate last Friday prior to your symptoms and she is wanting you to get a cardiac work up.

## 2016-09-23 NOTE — ED Notes (Signed)
Assigned MD contacted and informed that there were no labs drawn for this pt. This RN asked MD if they would like for her to order labs. MD states she did not but would see the pt shortly.

## 2016-09-23 NOTE — Telephone Encounter (Signed)
Pt says he started having shortness of breathe, mainly at night, a couple of days ago. His orthopedic, Dr Estanislado Pandy put his on Methotrexate and told him if he started to have shortness of breathe to see his primary doctor. Pt coming in to see Vickie today.

## 2016-09-23 NOTE — ED Provider Notes (Signed)
[facility] DEPT Provider Note   CSN: [id number] Arrival date & time: 09/23/16  1505     History   Chief Complaint Chief Complaint  Patient presents with  . Shortness of Breath    HPI Tony Mcmillan is a 75 y.o. male.  The history is provided by the patient.  Shortness of Breath  This is a new problem. The average episode lasts 2 days. The problem occurs intermittently.The current episode started 2 days ago. The problem has not changed since onset.Associated symptoms include orthopnea. Pertinent negatives include no fever, no headaches, no rhinorrhea, no cough, no sputum production, no wheezing, no chest pain, no vomiting, no abdominal pain and no rash.    Past Medical History:  Diagnosis Date  . Anemia   . Arthritis   . Blood transfusion without reported diagnosis   . Emphysema of lung ([place]) 02/08/2016  . GERD (gastroesophageal reflux disease)     Patient Active Problem List   Diagnosis Date Noted  . 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 History of anemia 07/02/2016  . History of colonic polyps 07/02/2016  . History of gastric ulcer 07/02/2016  . Asymptomatic microscopic hematuria 04/03/2016  . Inguinal hernia 04/03/2016  . BPH (benign prostatic hypertrophy) 04/03/2016  . Renal cyst, left 04/03/2016  . Lumbar spondylosis 04/03/2016  . DDD (degenerative disc disease), lumbar 04/03/2016  . Colonic polyp 02/23/2016  . Duodenal ulcer 02/23/2016    Past Surgical History:  Procedure Laterality Date  . ANTERIOR CERVICAL CORPECTOMY    . ANTERIOR CERVICAL CORPECTOMY  12/2014   for infection. this was in [place]  . CATARACT EXTRACTION     both eyes  . COLOSTOMY REVERSAL     had infection on buttocks and had to have a skin graft- had colostomy to help area stay clean and heal  . SPINE SURGERY         Home Medications    Prior to Admission medications   Medication Sig Start Date End Date Taking? Authorizing Provider  Cholecalciferol (VITAMIN D3) 50000 units CAPS Take  50,000 Units by mouth 2 (two) times a week. Wednesdays and Sundays   Yes Historical Provider, MD  ferrous sulfate 325 (65 FE) MG tablet TAKE 1 TABLET BY MOUTH TWICE A DAY WITH MEALS FOR 3 MONTH Patient taking differently: TAKE 1 TABLET BY MOUTH TWICE A DAY WITH MEALS (BREAKFAST AND LUNCH) FOR 3 MONTH 07/22/16  Yes Ladene Artist, MD  folic acid (FOLVITE) 1 MG tablet Take 2 mg by mouth daily.   Yes Historical Provider, MD  methotrexate (RHEUMATREX) 2.5 MG tablet Take 10-20 mg by mouth See admin instructions. Tapered course started 09/13/16: take 4 tablets (10 mg) by mouth on Friday mornings for 2 weeks, then take 6 tablets (15 mg) on Fridays for 2 weeks, then take 8 tablets (20 mg) on Fridays for 2 weeks, then stop 09/13/16  Yes Historical Provider, MD  omeprazole (PRILOSEC) 20 MG capsule Take 20 mg by mouth daily. 09/06/16  Yes Historical Provider, MD    Family History Family History  Problem Relation Age of Onset  . Hypertension Mother   . Hypertension Father   . Diabetes Father   . Hyperlipidemia Sister   . Diabetes Sister   . Stroke Brother   . Diabetes Brother   . Colon polyps Neg Hx   . Esophageal cancer Neg Hx   . Stomach cancer Neg Hx   . Rectal cancer Neg Hx   . Colon cancer Neg Hx  Social History Social History  Substance Use Topics  . Smoking status: Former Smoker    Types: Cigarettes    Quit date: 12/23/1993  . Smokeless tobacco: Never Used  . Alcohol use No     Allergies   Review of patient's allergies indicates no known allergies.   Review of Systems Review of Systems  Constitutional: Negative for fever.  HENT: Negative for congestion and rhinorrhea.   Respiratory: Positive for shortness of breath. Negative for cough, sputum production, chest tightness, wheezing and stridor.   Cardiovascular: Positive for orthopnea. Negative for chest pain.  Gastrointestinal: Negative for abdominal pain and vomiting.  Genitourinary: Negative for flank pain.  Musculoskeletal:  Negative for arthralgias and myalgias.  Skin: Negative for rash.  Neurological: Negative for headaches.  Psychiatric/Behavioral: Negative for confusion.     Physical Exam Updated Vital Signs BP 123/79   Pulse 61   Temp 98.3 F (36.8 C) (Oral)   Resp 24   Ht 5\' 3"  (1.6 m)   Wt 69.9 kg   SpO2 98%   BMI 27.28 kg/m   Physical Exam  Constitutional: He is oriented to person, place, and time. He appears well-developed and well-nourished. No distress.  Pleasant, cooperative, appears younger than age, well-appearing  HENT:  Head: Normocephalic and atraumatic.  Eyes: Conjunctivae are normal. No scleral icterus.  Neck: Normal range of motion. Neck supple. No JVD present. No tracheal deviation present.  Cardiovascular: Normal rate, regular rhythm and intact distal pulses.  Exam reveals no gallop and no friction rub.   Pulmonary/Chest: Effort normal and breath sounds normal. No respiratory distress.  Abdominal: Soft. He exhibits no distension. There is no tenderness.  Musculoskeletal: He exhibits no edema or tenderness.  Symmetric size and appearance of b/l Le's. No calf swelling or tenderness  Neurological: He is alert and oriented to person, place, and time. He exhibits normal muscle tone. Coordination normal.  Skin: Skin is warm and dry. Capillary refill takes less than 2 seconds. He is not diaphoretic. No pallor.  Psychiatric: He has a normal mood and affect.  Nursing note and vitals reviewed.    ED Treatments / Results  Labs (all labs ordered are listed, but only abnormal results are displayed) Labs Reviewed - No data to display  EKG  EKG Interpretation  Date/Time:  Monday September 23 2016 15:19:36 EDT Ventricular Rate:  87 PR Interval:  142 QRS Duration: 74 QT Interval:  332 QTC Calculation: 399 R Axis:   26 Text Interpretation:  Normal sinus rhythm Low voltage QRS Cannot rule out Anterior infarct , age undetermined Abnormal ECG No old tracing to compare Confirmed by  [facility]  MD, DAVID ([id number]) on 09/23/2016 3:22:12 PM       Radiology Dg Chest 2 View  Result Date: 09/23/2016 CLINICAL DATA:  Three-day history of shortness of breath EXAM: CHEST  2 VIEW COMPARISON:  September 13, 2016 FINDINGS: There is stable elevation of the right hemidiaphragm. There is no edema or consolidation. The heart size and pulmonary vascularity are normal. No adenopathy. No bone lesions. There is postoperative change in the cervical spine. IMPRESSION: Stable elevation of the right hemidiaphragm. No edema or consolidation. Stable cardiac silhouette. Electronically Signed   By: Lowella Grip III M.D.   On: 09/23/2016 16:00    Procedures Procedures (including critical care time)  Medications Ordered in ED Medications - No data to display   Initial Impression / Assessment and Plan / ED Course  I have reviewed the triage vital signs and the  nursing notes.  Pertinent labs & imaging results that were available during my care of the patient were reviewed by me and considered in my medical decision making (see chart for details).  Clinical Course   Tony Mcmillan is a 75 y.o. male with arthritis for which he follows with rheumatology and recently started on methotrexate (first and only dose so far was given 4 days ago on Friday), who presents to ED per recommendation of PCP for evaluation of difficulty taking a deep breath x 2-3 days. No associated chest pain or discomfort. Pt states he is not short of breath, denies DOE, denies orthopnea, denies PND. No fevers/chills, no coughing, no fatigue, no other infectious sx. Doubt heart failure, doubt ACS, doubt Pe, doubt electrolyte derrangement, doubt anemia, doubt other acute etiologies of sensation he cannot catch a deep breath. Pt is very comfortable and well-appearing, no dyspnea with activity. CXR without consolidation or vascular congestion. Normal heart size. No concerning EKG changes. Advised to f/u with PCP and to call the provided [facility]  cardiology phone number, as his rheumatologist suggested evaluation of this by cardiologist given that he has newly started on methotrexate. Advised to return to ER for shortness of breath, chest pain, fevers, or any other new, worse, or concerning symptoms. Pt demonstrates understanding of this and comfort and desire for d/c home.  Pt condition, course, and discharge were discussed with attending physician Dr. Delora Fuel.  Final Clinical Impressions(s) / ED Diagnoses   Final diagnoses:  Shortness of breath    New Prescriptions Discharge Medication List as of 09/23/2016 11:24 PM       Paralee Cancel, MD [id number]    Delora Fuel, MD [id number]

## 2016-09-24 NOTE — Progress Notes (Signed)
Cardiology Office Note    Date:  09/25/2016   ID:  Tony Mcmillan, DOB 12-Oct-1941, MRN [id number]  PCP:  Harland Dingwall, NP  Cardiologist:  New   CC: SOB  History of Present Illness:  Tony Mcmillan is a 75 y.o. 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  2D ECHO in 2015 was normal with EF >55%. He follows with rheumatology and recently started on methotrexate (first and only dose so far was given 4 days ago on Friday 9/29). He was seen by PCP on 09/23/16 for acute SOB and tachypnea. He was referred to the ED where the patient appeared very comfortable and well-appearing. CXR without consolidation or vascular congestion. Normal heart size. No concerning EKG changes. Troponin negative. He was sent home with outpatient cardiology follow up.   Today he presents to clinic for evaluation. He is feeling much better today but still feels like he has to "catch his breath" sometimes. No chest pain but sometimes feels like his chest pain gets a little tight. Not related to exertion. Mostly when he is laying down. He doesn't seem to get SOB associated with exertion. He also gets SOB when laying down. No dizziness or syncope. No blood in stool or urine. No pain in legs with walking. He walks with a cane.  His brother had a stroke and stents. He smoked ~ 1PPD > 10 years but quit over 20 years ago.     Past Medical History:  Diagnosis Date  . Anemia   . Arthritis   . Blood transfusion without reported diagnosis   . Emphysema of lung ([place]) 02/08/2016  . GERD (gastroesophageal reflux disease)     Past Surgical History:  Procedure Laterality Date  . ANTERIOR CERVICAL CORPECTOMY    . ANTERIOR CERVICAL CORPECTOMY  12/2014   for infection. this was in [place]  . CATARACT EXTRACTION     both eyes  . COLOSTOMY REVERSAL     had infection on buttocks and had to have a skin graft- had colostomy to help area stay clean and heal    . SPINE SURGERY      Current Medications: Outpatient Medications Prior to Visit  Medication Sig Dispense Refill  . Cholecalciferol (VITAMIN D3) 50000 units CAPS Take 50,000 Units by mouth 2 (two) times a week. Wednesdays and Sundays    . omeprazole (PRILOSEC) 20 MG capsule Take 20 mg by mouth daily.    . ferrous sulfate 325 (65 FE) MG tablet TAKE 1 TABLET BY MOUTH TWICE A DAY WITH MEALS FOR 3 MONTH (Patient taking differently: TAKE 1 TABLET BY MOUTH TWICE A DAY WITH MEALS (BREAKFAST AND LUNCH) FOR 3 MONTH) 30 tablet 2  . folic acid (FOLVITE) 1 MG tablet Take 2 mg by mouth daily.    . methotrexate (RHEUMATREX) 2.5 MG tablet Take 10-20 mg by mouth See admin instructions. Tapered course started 09/13/16: take 4 tablets (10 mg) by mouth on Friday mornings for 2 weeks, then take 6 tablets (15 mg) on Fridays for 2 weeks, then take 8 tablets (20 mg) on Fridays for 2 weeks, then stop     No facility-administered medications prior to visit.      Allergies:   Review of patient's allergies indicates no known allergies.   Social History   Social History  . Marital status: Single    Spouse name: N/A  . Number of children: N/A  .  Years of education: N/A   Social History Main Topics  . Smoking status: Former Smoker    Types: Cigarettes    Quit date: 12/23/1993  . Smokeless tobacco: Never Used  . Alcohol use No  . Drug use: No  . Sexual activity: Yes   Other Topics Concern  . None   Social History Narrative  . None     Family History:  The patient's family history includes Diabetes in his brother, father, and sister; Hyperlipidemia in his sister; Hypertension in his father and mother; Stroke in his brother.     ROS:   Please see the history of present illness.    ROS All other systems reviewed and are negative.   PHYSICAL EXAM:   VS:  BP 130/70 (BP Location: Right Arm)   Pulse 91   Ht 5\' 3"  (1.6 m)   Wt 153 lb 12.8 oz (69.8 kg)   SpO2 94%   BMI 27.24 kg/m    GEN: Well  nourished, well developed, in no acute distress  HEENT: normal  Neck: no JVD, carotid bruits, or masses Cardiac: RRR; no murmurs, rubs, or gallops,no edema  Respiratory:  clear to auscultation bilaterally, normal work of breathing GI: soft, nontender, nondistended, + BS MS: no deformity or atrophy  Skin: warm and dry, no rash Neuro:  Alert and Oriented x 3, Strength and sensation are intact Psych: euthymic mood, full affect  Wt Readings from Last 3 Encounters:  09/25/16 153 lb 12.8 oz (69.8 kg)  09/23/16 154 lb (69.9 kg)  09/23/16 154 lb 9.6 oz (70.1 kg)      Studies/Labs Reviewed:   EKG:  EKG is ordered today.  The ekg ordered today demonstrates NSR HR 81 with PAC  Recent Labs: 12/05/2015: ALT 27; BUN 11; Creat 1.04; Potassium 4.7; Sodium 137; TSH 1.780 07/02/2016: Hemoglobin 14.2; Platelets 306   Lipid Panel    Component Value Date/Time   CHOL 206 (H) 03/19/2016 0001   TRIG 210 (H) 03/19/2016 0001   HDL 49 03/19/2016 0001   CHOLHDL 4.2 03/19/2016 0001   VLDL 42 (H) 03/19/2016 0001   LDLCALC 115 03/19/2016 0001    Additional studies/ records that were reviewed today include:  Scanned echo 2015 with normal EF >55%   ASSESSMENT & PLAN:   Tony Mcmillan is a 75 y.o. 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  SOB: will get myoview and 2D ECHO to rule out ischemia and evaluate structure and fucntion of heart. He does have RFs for CAD with chronic inflammatory dz (RA), history of tobacco abuse and family hx of CAD (brother with MI and stents in 63s). Acute shortness of breath could have been related to MTX, so will hold for now.   RA: holding MTX for now.   GERD: continue PPI  Medication Adjustments/Labs and Tests Ordered: Current medicines are reviewed at length with the patient today.  Concerns regarding medicines are outlined above.  Medication changes, Labs and Tests ordered today  are listed in the Patient Instructions below. Patient Instructions  Medication Instructions:  Your physician recommends that you continue on your current medications as directed. Please refer to the Current Medication list given to you today.   Labwork: None ordered  Testing/Procedures: Your physician has requested that you have a lexiscan myoview. For further information please visit [email or web address]. Please follow instruction sheet, as given.  Your physician has requested that you  have an echocardiogram. Echocardiography is a painless test that uses sound waves to create images of your heart. It provides your doctor with information about the size and shape of your heart and how well your heart's chambers and valves are working. This procedure takes approximately one hour. There are no restrictions for this procedure.    Follow-Up: Your physician recommends that you schedule a follow-up appointment in: WILL BE BASED UPON YOUR TEST RESULTS   Any Other Special Instructions Will Be Listed Below (If Applicable). Pharmacologic Stress Electrocardiogram A pharmacologic stress electrocardiogram is a heart (cardiac) test that uses nuclear imaging to evaluate the blood supply to your heart. This test may also be called a pharmacologic stress electrocardiography. Pharmacologic means that a medicine is used to increase your heart rate and blood pressure.  This stress test is done to find areas of poor blood flow to the heart by determining the extent of coronary artery disease (CAD). Some people exercise on a treadmill, which naturally increases the blood flow to the heart. For those people unable to exercise on a treadmill, a medicine is used. This medicine stimulates your heart and will cause your heart to beat harder and more quickly, as if you were exercising.  Pharmacologic stress tests can help determine:  The adequacy of blood flow to your heart during increased levels of activity in order  to clear you for discharge home.  The extent of coronary artery blockage caused by CAD.  Your prognosis if you have suffered a heart attack.  The effectiveness of cardiac procedures done, such as an angioplasty, which can increase the circulation in your coronary arteries.  Causes of chest pain or pressure. LET [facility] CARE PROVIDER KNOW ABOUT:  Any allergies you have.  All medicines you are taking, including vitamins, herbs, eye drops, creams, and over-the-counter medicines.  Previous problems you or members of your family have had with the use of anesthetics.  Any blood disorders you have.  Previous surgeries you have had.  Medical conditions you have.  Possibility of pregnancy, if this applies.  If you are currently breastfeeding. RISKS AND COMPLICATIONS Generally, this is a safe procedure. However, as with any procedure, complications can occur. Possible complications include:  You develop pain or pressure in the following areas:  Chest.  Jaw or neck.  Between your shoulder blades.  Radiating down your left arm.  Headache.  Dizziness or light-headedness.  Shortness of breath.  Increased or irregular heartbeat.  Low blood pressure.  Nausea or vomiting.  Flushing.  Redness going up the arm and slight pain during injection of medicine.  Heart attack (rare). BEFORE THE PROCEDURE   Avoid all forms of caffeine for 24 hours before your test or as directed by your health care provider. This includes coffee, tea (even decaffeinated tea), caffeinated sodas, chocolate, cocoa, and certain pain medicines.  Follow your health care provider's instructions regarding eating and drinking before the test.  Take your medicines as directed at regular times with water unless instructed otherwise. Exceptions may include:  If you have diabetes, ask how you are to take your insulin or pills. It is common to adjust insulin dosing the morning of the test.  If you are  taking beta-blocker medicines, it is important to talk to your health care provider about these medicines well before the date of your test. Taking beta-blocker medicines may interfere with the test. In some cases, these medicines need to be changed or stopped 24 hours or more before the  test.  If you wear a nitroglycerin patch, it may need to be removed prior to the test. Ask your health care provider if the patch should be removed before the test.  If you use an inhaler for any breathing condition, bring it with you to the test.  If you are an outpatient, bring a snack so you can eat right after the stress phase of the test.  Do not smoke for 4 hours prior to the test or as directed by your health care provider.  Do not apply lotions, powders, creams, or oils on your chest prior to the test.  Wear comfortable shoes and clothing. Let your health care provider know if you were unable to complete or follow the preparations for your test. PROCEDURE   Multiple patches (electrodes) will be put on your chest. If needed, small areas of your chest may be shaved to get better contact with the electrodes. Once the electrodes are attached to your body, multiple wires will be attached to the electrodes, and your heart rate will be monitored.  An IV access will be started. A nuclear trace (isotope) is given. The isotope may be given intravenously, or it may be swallowed. Nuclear refers to several types of radioactive isotopes, and the nuclear isotope lights up the arteries so that the nuclear images are clear. The isotope is absorbed by your body. This results in low radiation exposure.  A resting nuclear image is taken to show how your heart functions at rest.  A medicine is given through the IV access.  A second scan is done about 1 hour after the medicine injection and determines how your heart functions under stress.  During this stress phase, you will be connected to an electrocardiogram machine.  Your blood pressure and oxygen levels will be monitored. AFTER THE PROCEDURE   Your heart rate and blood pressure will be monitored after the test.  You may return to your normal schedule, including diet,activities, and medicines, unless your health care provider tells you otherwise.   This information is not intended to replace advice given to you by your health care provider. Make sure you discuss any questions you have with your health care provider.   Document Released: 04/27/2009 Document Revised: 12/14/2013 Document Reviewed: 08/16/2013 Elsevier Interactive Patient Education 2016 [employer].  Echocardiogram An echocardiogram, or echocardiography, uses sound waves (ultrasound) to produce an image of your heart. The echocardiogram is simple, painless, obtained within a short period of time, and offers valuable information to your health care provider. The images from an echocardiogram can provide information such as:  Evidence of coronary artery disease (CAD).  Heart size.  Heart muscle function.  Heart valve function.  Aneurysm detection.  Evidence of a past heart attack.  Fluid buildup around the heart.  Heart muscle thickening.  Assess heart valve function. LET [facility] CARE PROVIDER KNOW ABOUT:  Any allergies you have.  All medicines you are taking, including vitamins, herbs, eye drops, creams, and over-the-counter medicines.  Previous problems you or members of your family have had with the use of anesthetics.  Any blood disorders you have.  Previous surgeries you have had.  Medical conditions you have.  Possibility of pregnancy, if this applies. BEFORE THE PROCEDURE  No special preparation is needed. Eat and drink normally.  PROCEDURE   In order to produce an image of your heart, gel will be applied to your chest and a wand-like tool (transducer) will be moved over your chest. The gel  will help transmit the sound waves from the transducer. The sound  waves will harmlessly bounce off your heart to allow the heart images to be captured in real-time motion. These images will then be recorded.  You may need an IV to receive a medicine that improves the quality of the pictures. AFTER THE PROCEDURE You may return to your normal schedule including diet, activities, and medicines, unless your health care provider tells you otherwise.   This information is not intended to replace advice given to you by your health care provider. Make sure you discuss any questions you have with your health care provider.   Document Released: 12/06/2000 Document Revised: 12/30/2014 Document Reviewed: 08/16/2013 Elsevier Interactive Patient Education [employer].    If you need a refill on your cardiac medications before your next appointment, please call your pharmacy.      Signed, Angelena Form, PA-C  09/25/2016 11:35 AM    [facility] [address], [place], [place]  [address] Phone: [phone]; Fax: [phone]

## 2016-09-25 ENCOUNTER — Encounter: Payer: Self-pay | Admitting: Physician Assistant

## 2016-09-25 ENCOUNTER — Telehealth: Payer: Self-pay | Admitting: Family Medicine

## 2016-09-25 ENCOUNTER — Ambulatory Visit (INDEPENDENT_AMBULATORY_CARE_PROVIDER_SITE_OTHER): Payer: Medicare Other | Admitting: Physician Assistant

## 2016-09-25 VITALS — BP 130/70 | HR 91 | Ht 63.0 in | Wt 153.8 lb

## 2016-09-25 DIAGNOSIS — K219 Gastro-esophageal reflux disease without esophagitis: Secondary | ICD-10-CM

## 2016-09-25 DIAGNOSIS — M069 Rheumatoid arthritis, unspecified: Secondary | ICD-10-CM

## 2016-09-25 DIAGNOSIS — R0602 Shortness of breath: Secondary | ICD-10-CM

## 2016-09-25 NOTE — Patient Instructions (Signed)
Medication Instructions:  Your physician recommends that you continue on your current medications as directed. Please refer to the Current Medication list given to you today.   Labwork: None ordered  Testing/Procedures: Your physician has requested that you have a lexiscan myoview. For further information please visit [email or web address]. Please follow instruction sheet, as given.  Your physician has requested that you have an echocardiogram. Echocardiography is a painless test that uses sound waves to create images of your heart. It provides your doctor with information about the size and shape of your heart and how well your heart's chambers and valves are working. This procedure takes approximately one hour. There are no restrictions for this procedure.    Follow-Up: Your physician recommends that you schedule a follow-up appointment in: WILL BE BASED UPON YOUR TEST RESULTS   Any Other Special Instructions Will Be Listed Below (If Applicable). Pharmacologic Stress Electrocardiogram A pharmacologic stress electrocardiogram is a heart (cardiac) test that uses nuclear imaging to evaluate the blood supply to your heart. This test may also be called a pharmacologic stress electrocardiography. Pharmacologic means that a medicine is used to increase your heart rate and blood pressure.  This stress test is done to find areas of poor blood flow to the heart by determining the extent of coronary artery disease (CAD). Some people exercise on a treadmill, which naturally increases the blood flow to the heart. For those people unable to exercise on a treadmill, a medicine is used. This medicine stimulates your heart and will cause your heart to beat harder and more quickly, as if you were exercising.  Pharmacologic stress tests can help determine:  The adequacy of blood flow to your heart during increased levels of activity in order to clear you for discharge home.  The extent of coronary artery  blockage caused by CAD.  Your prognosis if you have suffered a heart attack.  The effectiveness of cardiac procedures done, such as an angioplasty, which can increase the circulation in your coronary arteries.  Causes of chest pain or pressure. LET [facility] CARE PROVIDER KNOW ABOUT:  Any allergies you have.  All medicines you are taking, including vitamins, herbs, eye drops, creams, and over-the-counter medicines.  Previous problems you or members of your family have had with the use of anesthetics.  Any blood disorders you have.  Previous surgeries you have had.  Medical conditions you have.  Possibility of pregnancy, if this applies.  If you are currently breastfeeding. RISKS AND COMPLICATIONS Generally, this is a safe procedure. However, as with any procedure, complications can occur. Possible complications include:  You develop pain or pressure in the following areas:  Chest.  Jaw or neck.  Between your shoulder blades.  Radiating down your left arm.  Headache.  Dizziness or light-headedness.  Shortness of breath.  Increased or irregular heartbeat.  Low blood pressure.  Nausea or vomiting.  Flushing.  Redness going up the arm and slight pain during injection of medicine.  Heart attack (rare). BEFORE THE PROCEDURE   Avoid all forms of caffeine for 24 hours before your test or as directed by your health care provider. This includes coffee, tea (even decaffeinated tea), caffeinated sodas, chocolate, cocoa, and certain pain medicines.  Follow your health care provider's instructions regarding eating and drinking before the test.  Take your medicines as directed at regular times with water unless instructed otherwise. Exceptions may include:  If you have diabetes, ask how you are to take your insulin or pills. It is common  to adjust insulin dosing the morning of the test.  If you are taking beta-blocker medicines, it is important to talk to your  health care provider about these medicines well before the date of your test. Taking beta-blocker medicines may interfere with the test. In some cases, these medicines need to be changed or stopped 24 hours or more before the test.  If you wear a nitroglycerin patch, it may need to be removed prior to the test. Ask your health care provider if the patch should be removed before the test.  If you use an inhaler for any breathing condition, bring it with you to the test.  If you are an outpatient, bring a snack so you can eat right after the stress phase of the test.  Do not smoke for 4 hours prior to the test or as directed by your health care provider.  Do not apply lotions, powders, creams, or oils on your chest prior to the test.  Wear comfortable shoes and clothing. Let your health care provider know if you were unable to complete or follow the preparations for your test. PROCEDURE   Multiple patches (electrodes) will be put on your chest. If needed, small areas of your chest may be shaved to get better contact with the electrodes. Once the electrodes are attached to your body, multiple wires will be attached to the electrodes, and your heart rate will be monitored.  An IV access will be started. A nuclear trace (isotope) is given. The isotope may be given intravenously, or it may be swallowed. Nuclear refers to several types of radioactive isotopes, and the nuclear isotope lights up the arteries so that the nuclear images are clear. The isotope is absorbed by your body. This results in low radiation exposure.  A resting nuclear image is taken to show how your heart functions at rest.  A medicine is given through the IV access.  A second scan is done about 1 hour after the medicine injection and determines how your heart functions under stress.  During this stress phase, you will be connected to an electrocardiogram machine. Your blood pressure and oxygen levels will be monitored. AFTER  THE PROCEDURE   Your heart rate and blood pressure will be monitored after the test.  You may return to your normal schedule, including diet,activities, and medicines, unless your health care provider tells you otherwise.   This information is not intended to replace advice given to you by your health care provider. Make sure you discuss any questions you have with your health care provider.   Document Released: 04/27/2009 Document Revised: 12/14/2013 Document Reviewed: 08/16/2013 Elsevier Interactive Patient Education 2016 [employer].  Echocardiogram An echocardiogram, or echocardiography, uses sound waves (ultrasound) to produce an image of your heart. The echocardiogram is simple, painless, obtained within a short period of time, and offers valuable information to your health care provider. The images from an echocardiogram can provide information such as:  Evidence of coronary artery disease (CAD).  Heart size.  Heart muscle function.  Heart valve function.  Aneurysm detection.  Evidence of a past heart attack.  Fluid buildup around the heart.  Heart muscle thickening.  Assess heart valve function. LET [facility] CARE PROVIDER KNOW ABOUT:  Any allergies you have.  All medicines you are taking, including vitamins, herbs, eye drops, creams, and over-the-counter medicines.  Previous problems you or members of your family have had with the use of anesthetics.  Any blood disorders you have.  Previous  surgeries you have had.  Medical conditions you have.  Possibility of pregnancy, if this applies. BEFORE THE PROCEDURE  No special preparation is needed. Eat and drink normally.  PROCEDURE   In order to produce an image of your heart, gel will be applied to your chest and a wand-like tool (transducer) will be moved over your chest. The gel will help transmit the sound waves from the transducer. The sound waves will harmlessly bounce off your heart to allow the heart  images to be captured in real-time motion. These images will then be recorded.  You may need an IV to receive a medicine that improves the quality of the pictures. AFTER THE PROCEDURE You may return to your normal schedule including diet, activities, and medicines, unless your health care provider tells you otherwise.   This information is not intended to replace advice given to you by your health care provider. Make sure you discuss any questions you have with your health care provider.   Document Released: 12/06/2000 Document Revised: 12/30/2014 Document Reviewed: 08/16/2013 Elsevier Interactive Patient Education [employer].    If you need a refill on your cardiac medications before your next appointment, please call your pharmacy.

## 2016-09-25 NOTE — Telephone Encounter (Signed)
Patient wanted you to know that he saw cardiologist today and they are doing stress test on 10/13/16 Dr. Angelena Form       He wanted you to know that when he went to the ER the day you sent him that he waited from 3 until 10 before somebody came in to see him

## 2016-09-27 DIAGNOSIS — Z79899 Other long term (current) drug therapy: Secondary | ICD-10-CM | POA: Diagnosis not present

## 2016-09-30 ENCOUNTER — Other Ambulatory Visit: Payer: Self-pay | Admitting: Gastroenterology

## 2016-10-03 ENCOUNTER — Ambulatory Visit (INDEPENDENT_AMBULATORY_CARE_PROVIDER_SITE_OTHER): Payer: Medicare Other | Admitting: Rheumatology

## 2016-10-03 DIAGNOSIS — M25521 Pain in right elbow: Secondary | ICD-10-CM | POA: Diagnosis not present

## 2016-10-03 DIAGNOSIS — M79641 Pain in right hand: Secondary | ICD-10-CM

## 2016-10-03 DIAGNOSIS — Z79899 Other long term (current) drug therapy: Secondary | ICD-10-CM | POA: Diagnosis not present

## 2016-10-03 DIAGNOSIS — M0579 Rheumatoid arthritis with rheumatoid factor of multiple sites without organ or systems involvement: Secondary | ICD-10-CM

## 2016-10-03 DIAGNOSIS — M25561 Pain in right knee: Secondary | ICD-10-CM | POA: Diagnosis not present

## 2016-10-09 ENCOUNTER — Telehealth (HOSPITAL_COMMUNITY): Payer: Self-pay | Admitting: *Deleted

## 2016-10-09 NOTE — Telephone Encounter (Signed)
Patient given detailed instructions per Myocardial Perfusion Study Information Sheet for the test on  10/14/16. Patient notified to arrive 15 minutes early and that it is imperative to arrive on time for appointment to keep from having the test rescheduled.  If you need to cancel or reschedule your appointment, please call the office within 24 hours of your appointment. Failure to do so may result in a cancellation of your appointment, and a $50 no show fee. Patient verbalized understanding. ,  Jacqueline     

## 2016-10-14 ENCOUNTER — Ambulatory Visit (HOSPITAL_COMMUNITY): Payer: Medicare Other | Attending: Cardiovascular Disease

## 2016-10-14 ENCOUNTER — Other Ambulatory Visit: Payer: Self-pay

## 2016-10-14 ENCOUNTER — Ambulatory Visit (HOSPITAL_BASED_OUTPATIENT_CLINIC_OR_DEPARTMENT_OTHER): Payer: Medicare Other

## 2016-10-14 DIAGNOSIS — R0602 Shortness of breath: Secondary | ICD-10-CM

## 2016-10-14 LAB — MYOCARDIAL PERFUSION IMAGING
LV dias vol: 60 mL (ref 62–150)
LV sys vol: 22 mL
Peak HR: 118 {beats}/min
RATE: 0.29
Rest HR: 71 {beats}/min
SDS: 11
SRS: 1
SSS: 12
TID: 0.96

## 2016-10-14 MED ORDER — TECHNETIUM TC 99M TETROFOSMIN IV KIT
10.6000 | PACK | Freq: Once | INTRAVENOUS | Status: AC | PRN
  Administered 2016-10-14: 10.6 via INTRAVENOUS
  Filled 2016-10-14: qty 11

## 2016-10-14 MED ORDER — TECHNETIUM TC 99M TETROFOSMIN IV KIT
32.4000 | PACK | Freq: Once | INTRAVENOUS | Status: AC | PRN
  Administered 2016-10-14: 32.4 via INTRAVENOUS
  Filled 2016-10-14: qty 33

## 2016-10-14 MED ORDER — REGADENOSON 0.4 MG/5ML IV SOLN
0.4000 mg | Freq: Once | INTRAVENOUS | Status: AC
  Administered 2016-10-14: 0.4 mg via INTRAVENOUS

## 2016-10-17 ENCOUNTER — Other Ambulatory Visit: Payer: Self-pay | Admitting: Rheumatology

## 2016-10-17 DIAGNOSIS — Z79899 Other long term (current) drug therapy: Secondary | ICD-10-CM | POA: Diagnosis not present

## 2016-10-17 LAB — COMPLETE METABOLIC PANEL WITH GFR
ALT: 16 U/L (ref 9–46)
AST: 18 U/L (ref 10–35)
Albumin: 3.7 g/dL (ref 3.6–5.1)
Alkaline Phosphatase: 67 U/L (ref 40–115)
BUN: 12 mg/dL (ref 7–25)
CO2: 23 mmol/L (ref 20–31)
Calcium: 9.1 mg/dL (ref 8.6–10.3)
Chloride: 103 mmol/L (ref 98–110)
Creat: 1.16 mg/dL (ref 0.70–1.18)
GFR, Est African American: 71 mL/min (ref >=60)
GFR, Est Non African American: 62 mL/min (ref >=60)
Glucose, Bld: 97 mg/dL (ref 65–99)
Potassium: 4.1 mmol/L (ref 3.5–5.3)
Sodium: 136 mmol/L (ref 135–146)
Total Bilirubin: 1 mg/dL (ref 0.2–1.2)
Total Protein: 7.2 g/dL (ref 6.1–8.1)

## 2016-10-17 LAB — CBC WITH DIFFERENTIAL/PLATELET
Basophils Absolute: 0 cells/uL (ref 0–200)
Basophils Relative: 0 %
Eosinophils Absolute: 92 cells/uL (ref 15–500)
Eosinophils Relative: 2 %
HCT: 39 % (ref 38.5–50.0)
Hemoglobin: 13.3 g/dL (ref 13.2–17.1)
Lymphocytes Relative: 24 %
Lymphs Abs: 1104 cells/uL (ref 850–3900)
MCH: 28 pg (ref 27.0–33.0)
MCHC: 34.1 g/dL (ref 32.0–36.0)
MCV: 82.1 fL (ref 80.0–100.0)
MPV: 9 fL (ref 7.5–12.5)
Monocytes Absolute: 506 cells/uL (ref 200–950)
Monocytes Relative: 11 %
Neutro Abs: 2898 cells/uL (ref 1500–7800)
Neutrophils Relative %: 63 %
Platelets: 260 10*3/uL (ref 140–400)
RBC: 4.75 MIL/uL (ref 4.20–5.80)
RDW: 16.3 % — ABNORMAL HIGH (ref 11.0–15.0)
WBC: 4.6 10*3/uL (ref 3.8–10.8)

## 2016-10-24 ENCOUNTER — Other Ambulatory Visit: Payer: Self-pay | Admitting: Rheumatology

## 2016-10-24 DIAGNOSIS — Z79899 Other long term (current) drug therapy: Secondary | ICD-10-CM

## 2016-10-24 DIAGNOSIS — Z79631 Long term (current) use of antimetabolite agent: Secondary | ICD-10-CM

## 2016-10-25 ENCOUNTER — Telehealth: Payer: Self-pay | Admitting: Rheumatology

## 2016-10-25 NOTE — Telephone Encounter (Signed)
Patient left message requesting lab results and he has questions about a medication dosage. Please advise.

## 2016-10-25 NOTE — Telephone Encounter (Signed)
I spoke to him, advised 8 per week, labs due again in Dec.

## 2016-11-04 ENCOUNTER — Ambulatory Visit: Payer: Medicare Other | Admitting: Rheumatology

## 2016-11-04 DIAGNOSIS — M47812 Spondylosis without myelopathy or radiculopathy, cervical region: Secondary | ICD-10-CM | POA: Insufficient documentation

## 2016-11-04 DIAGNOSIS — Z79899 Other long term (current) drug therapy: Secondary | ICD-10-CM | POA: Insufficient documentation

## 2016-11-04 DIAGNOSIS — E559 Vitamin D deficiency, unspecified: Secondary | ICD-10-CM | POA: Insufficient documentation

## 2016-11-04 DIAGNOSIS — M069 Rheumatoid arthritis, unspecified: Secondary | ICD-10-CM

## 2016-11-04 HISTORY — DX: Spondylosis without myelopathy or radiculopathy, cervical region: M47.812

## 2016-11-04 HISTORY — DX: Rheumatoid arthritis, unspecified: M06.9

## 2016-11-04 HISTORY — DX: Vitamin D deficiency, unspecified: E55.9

## 2016-11-04 NOTE — Progress Notes (Signed)
Office Visit Note  Patient: Tony Mcmillan             Date of Birth: 29-Nov-1941           MRN: [id number]             PCP: Harland Dingwall, NP Referring: Girtha Rm, NP Visit Date: 11/06/2016 Occupation: Retired, Programmer, systems    Subjective:  Left hand pain   History of Present Illness: Tony Mcmillan is a 75 y.o. right-handed male with history of seronegative, erosive rheumatoid arthritis he has been on methotrexate for couple of months now and increase his methotrexate to 8 tablets per week on October 26. He has not noted much improvement in his symptoms. He continues to have pain and stiffness in his bilateral hands, left elbow and left shoulder pain. He still having swelling in his hands and wrists joints.   Activities of Daily Living:  Patient reports morning stiffness for 30 minutes.   Patient Reports nocturnal pain.  Difficulty dressing/grooming: Denies Difficulty climbing stairs: Denies Difficulty getting out of chair: Reports Difficulty using hands for taps, buttons, cutlery, and/or writing: Reports   Review of Systems  Constitutional: Negative for fatigue, night sweats and weakness ( ).  HENT: Negative for mouth sores, mouth dryness and nose dryness.   Eyes: Negative for redness and dryness.  Respiratory: Negative for shortness of breath and difficulty breathing.   Cardiovascular: Negative for chest pain, palpitations, hypertension, irregular heartbeat and swelling in legs/feet.  Gastrointestinal: Negative for constipation and diarrhea.  Endocrine: Negative for increased urination.  Musculoskeletal: Positive for arthralgias, joint pain, joint swelling and morning stiffness. Negative for myalgias, muscle weakness, muscle tenderness and myalgias.  Skin: Negative for color change, rash, hair loss, nodules/bumps, skin tightness, ulcers and sensitivity to sunlight.  Allergic/Immunologic: Negative for susceptible to infections.  Neurological: Negative for dizziness,  fainting, memory loss and night sweats.  Hematological: Negative for swollen glands.  Psychiatric/Behavioral: Negative for depressed mood and sleep disturbance. The patient is not nervous/anxious.     PMFS History:  Patient Active Problem List   Diagnosis Date Noted  . Rheumatoid arthritis of multiple sites with negative rheumatoid factor ([place]) 11/04/2016  . High risk medication use 11/04/2016  . 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 Vitamin D deficiency 11/04/2016  . 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 History of anemia 07/02/2016  . History of colonic polyps 07/02/2016  . History of gastric ulcer 07/02/2016  . Asymptomatic microscopic hematuria 04/03/2016  . Inguinal hernia 04/03/2016  . BPH (benign prostatic hypertrophy) 04/03/2016  . Renal cyst, left 04/03/2016  . 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 DDD (degenerative disc disease), lumbar 04/03/2016  . Colonic polyp 02/23/2016  . Duodenal ulcer 02/23/2016    Past Medical History:  Diagnosis Date  . Anemia   . Arthritis   . Blood transfusion without reported diagnosis   . Emphysema of lung ([place]) 02/08/2016  . GERD (gastroesophageal reflux disease)     Family History  Problem Relation Age of Onset  . Hypertension Mother   . Hypertension Father   . Diabetes Father   . Hyperlipidemia Sister   . Diabetes Sister   . Stroke Brother   . Diabetes Brother   . Colon polyps Neg Hx   . Esophageal cancer Neg Hx   . Stomach cancer Neg Hx   . Rectal cancer Neg Hx   . Colon cancer Neg Hx    Past Surgical History:  Procedure Laterality Date  .  ANTERIOR CERVICAL CORPECTOMY    . ANTERIOR CERVICAL CORPECTOMY  12/2014   for infection. this was in [place]  . CATARACT EXTRACTION     both eyes  . COLOSTOMY REVERSAL     had infection on buttocks and had to have a skin graft- had colostomy to help area stay clean and heal  . SPINE SURGERY     Social History   Social History Narrative  . No narrative on file      Objective: Vital Signs: BP (!) 141/78 (BP Location: Left Arm, Patient Position: Sitting, Cuff Size: Large)   Pulse 89   Resp 13   Ht 5\' 3"  (1.6 m)   Wt 155 lb (70.3 kg)   BMI 27.46 kg/m    Physical Exam  Constitutional: He is oriented to person, place, and time. He appears well-developed and well-nourished.  HENT:  Head: Normocephalic and atraumatic.  Eyes: Conjunctivae and EOM are normal. Pupils are equal, round, and reactive to light.  Neck: Normal range of motion. Neck supple.  Cardiovascular: Normal rate, regular rhythm and normal heart sounds.   Pulmonary/Chest: Effort normal and breath sounds normal.  Abdominal: Soft. Bowel sounds are normal.  Neurological: He is alert and oriented to person, place, and time.  Skin: Skin is warm and dry. Capillary refill takes less than 2 seconds.  Psychiatric: He has a normal mood and affect. His behavior is normal.  Nursing note and vitals reviewed.    Musculoskeletal Exam: C-spine limited range of motion with some discomfort,, bilateral shoulder joint painful range of motion limited to 100 abduction bilaterally, contractured bilateral elbow joints. Decreased range of motion of bilateral wrist joints. Left fist formation about 90%. He has thickening over bilateral first second and third MCP joint and wrist joint but no synovitis. DIP PIP thickening was noted on bilateral hands. hand. He has mild extensor due to synovitis on dorsal aspect of bilateral wrist. Hip joints knee joints ankles MTPs were good range of motion with no synovitis.  CDAI Exam: CDAI Homunculus Exam:   Tenderness:  Left hand: 2nd MCP  Joint Counts:  CDAI Tender Joint count: 1 CDAI Swollen Joint count: 0  Global Assessments:  Patient Global Assessment: 5 Provider Global Assessment: 4  CDAI Calculated Score: 10    Investigation: Findings:  August 2017: TB negative, chest x-ray norma,l hepatitis negative, HIV negative, SPEP negative, immunoglobulins  normal, pneumococcal vaccine December 2016, and zoster vaccine December 2016, October 2017 CBC normal, CMP normal, vitamin D 14, 14 33 eta negative negative uric acid 7.5    Imaging: No results found.  Speciality Comments: No specialty comments available.    Procedures:  No procedures performed Allergies: Patient has no known allergies.   Assessment / Plan: Visit Diagnoses: Rheumatoid arthritis of multiple sites with negative rheumatoid factor (HCC) - -RF,-CCP,-1433eta, erosive disease with contractures: He has remarkable improvement clinically and numb synovitis some of his joints. Today only mild stenosis synovitis noted over the extensor aspect of his bilateral wrist. No synovitis was noted in his MCP joints.  High risk medication use - Methotrexate 8 tablets by mouth every week along with folic acid 2 mg by mouth daily. His labs are due today and then every 3 months to monitor for drug toxicity. Refill for methotrexate 90 day supply was given today, side effects were reviewed.  He had recent hospitalization for shortness of breath and has appointment with pulmonologist next month. No shortness of breath was reported today.  DJD (degenerative joint disease), cervical -  Status post fusion 2015: Limited range of motion  Osteoarthritis of lumbar spine - Scoliosis  Vitamin D deficiency: On supplement  Other medical problems are listed as follows:  Duodenal ulcer  Polyp of colon, unspecified part of colon, unspecified type  Former smoker, stopped smoking in distant past    Orders: Orders Placed This Encounter  Procedures  . CBC with Differential/Platelet  . COMPLETE METABOLIC PANEL WITH GFR   Meds ordered this encounter  Medications  . methotrexate (RHEUMATREX) 2.5 MG tablet    Sig: Take 8 tablets (20 mg total) by mouth once a week. Caution:Chemotherapy. Protect from light.    Dispense:  96 tablet    Refill:  0    Face-to-face time spent with patient was 30 minutes.  50% of time was spent in counseling and coordination of care.  Follow-Up Instructions: Return in about 4 months (around 03/06/2017) for Rheumatoid arthritis.   Bo Merino, MD

## 2016-11-06 ENCOUNTER — Encounter: Payer: Self-pay | Admitting: Rheumatology

## 2016-11-06 ENCOUNTER — Ambulatory Visit (INDEPENDENT_AMBULATORY_CARE_PROVIDER_SITE_OTHER): Payer: Medicare Other | Admitting: Rheumatology

## 2016-11-06 VITALS — BP 141/78 | HR 89 | Resp 13 | Ht 63.0 in | Wt 155.0 lb

## 2016-11-06 DIAGNOSIS — K635 Polyp of colon: Secondary | ICD-10-CM | POA: Diagnosis not present

## 2016-11-06 DIAGNOSIS — M47816 Spondylosis without myelopathy or radiculopathy, lumbar region: Secondary | ICD-10-CM | POA: Diagnosis not present

## 2016-11-06 DIAGNOSIS — Z87891 Personal history of nicotine dependence: Secondary | ICD-10-CM | POA: Diagnosis not present

## 2016-11-06 DIAGNOSIS — M0609 Rheumatoid arthritis without rheumatoid factor, multiple sites: Secondary | ICD-10-CM | POA: Diagnosis not present

## 2016-11-06 DIAGNOSIS — M5030 Other cervical disc degeneration, unspecified cervical region: Secondary | ICD-10-CM

## 2016-11-06 DIAGNOSIS — K269 Duodenal ulcer, unspecified as acute or chronic, without hemorrhage or perforation: Secondary | ICD-10-CM | POA: Diagnosis not present

## 2016-11-06 DIAGNOSIS — E559 Vitamin D deficiency, unspecified: Secondary | ICD-10-CM | POA: Diagnosis not present

## 2016-11-06 DIAGNOSIS — M47812 Spondylosis without myelopathy or radiculopathy, cervical region: Secondary | ICD-10-CM

## 2016-11-06 DIAGNOSIS — Z79899 Other long term (current) drug therapy: Secondary | ICD-10-CM | POA: Diagnosis not present

## 2016-11-06 LAB — COMPLETE METABOLIC PANEL WITH GFR
ALT: 20 U/L (ref 9–46)
AST: 22 U/L (ref 10–35)
Albumin: 3.8 g/dL (ref 3.6–5.1)
Alkaline Phosphatase: 67 U/L (ref 40–115)
BUN: 11 mg/dL (ref 7–25)
CO2: 24 mmol/L (ref 20–31)
Calcium: 9.1 mg/dL (ref 8.6–10.3)
Chloride: 104 mmol/L (ref 98–110)
Creat: 1.08 mg/dL (ref 0.70–1.18)
GFR, Est African American: 78 mL/min (ref >=60)
GFR, Est Non African American: 67 mL/min (ref >=60)
Glucose, Bld: 108 mg/dL — ABNORMAL HIGH (ref 65–99)
Potassium: 4.1 mmol/L (ref 3.5–5.3)
Sodium: 137 mmol/L (ref 135–146)
Total Bilirubin: 0.8 mg/dL (ref 0.2–1.2)
Total Protein: 6.9 g/dL (ref 6.1–8.1)

## 2016-11-06 LAB — CBC WITH DIFFERENTIAL/PLATELET
Basophils Absolute: 0 cells/uL (ref 0–200)
Basophils Relative: 0 %
Eosinophils Absolute: 106 cells/uL (ref 15–500)
Eosinophils Relative: 2 %
HCT: 40.9 % (ref 38.5–50.0)
Hemoglobin: 14 g/dL (ref 13.2–17.1)
Lymphocytes Relative: 19 %
Lymphs Abs: 1007 cells/uL (ref 850–3900)
MCH: 28.3 pg (ref 27.0–33.0)
MCHC: 34.2 g/dL (ref 32.0–36.0)
MCV: 82.8 fL (ref 80.0–100.0)
MPV: 9.4 fL (ref 7.5–12.5)
Monocytes Absolute: 583 cells/uL (ref 200–950)
Monocytes Relative: 11 %
Neutro Abs: 3604 cells/uL (ref 1500–7800)
Neutrophils Relative %: 68 %
Platelets: 286 10*3/uL (ref 140–400)
RBC: 4.94 MIL/uL (ref 4.20–5.80)
RDW: 17.1 % — ABNORMAL HIGH (ref 11.0–15.0)
WBC: 5.3 10*3/uL (ref 3.8–10.8)

## 2016-11-06 MED ORDER — METHOTREXATE 2.5 MG PO TABS: 20.0000 mg | ORAL_TABLET | ORAL | 0 refills | Status: DC

## 2016-11-06 NOTE — Progress Notes (Signed)
Rheumatology Medication Review by a Pharmacist Does the patient feel that his/her medications are working for him/her?  Yes - but he continues to have some pain in his left hand  Has the patient been experiencing any side effects to the medications prescribed?  No Does the patient have any problems obtaining medications?  No  Issues to address at subsequent visits: None   Pharmacist comments:  Tony Mcmillan is a pleasant 75 yo M who presents to clinic for follow up on his sero-negative rheumatoid arthritis.  Patient reports he increased methotrexate dose to 8 tablets/week on 10/17/16.  Patient has not had labs since increasing his methotrexate dose.  Patient is due for his standing orders at this time.  Will place order for CBC and CMP.  Patient did not have any specific questions or concerns regarding his medications at this time.     Elisabeth Most, Pharm.D., BCPS Clinical Pharmacist Pager: [phone] Phone: [phone] 11/06/2016 1:45 PM

## 2016-11-06 NOTE — Patient Instructions (Signed)
Standing Labs We placed an order today for your standing lab work.    Please come back and get your standing labs in February and every 3 months  We have open lab Monday through Friday from 8:30-11:30 AM and 1-4 PM at the office of Dr.  /Naitik Panwala, PA.   The office is located at [address], [address], [place], [place] [address] No appointment is necessary.   Labs are drawn by [facility].  You may receive a bill from [facility] for your lab work.    

## 2016-11-07 ENCOUNTER — Telehealth: Payer: Self-pay | Admitting: Rheumatology

## 2016-11-07 ENCOUNTER — Telehealth: Payer: Self-pay | Admitting: Radiology

## 2016-11-07 NOTE — Telephone Encounter (Signed)
Called labs WNL

## 2016-11-07 NOTE — Telephone Encounter (Signed)
Patient returned Tony Mcmillan's phone call from today.

## 2016-11-07 NOTE — Telephone Encounter (Signed)
I have called patient to advise labs are normal  

## 2016-11-09 ENCOUNTER — Other Ambulatory Visit: Payer: Self-pay | Admitting: Gastroenterology

## 2016-11-22 ENCOUNTER — Other Ambulatory Visit: Payer: Self-pay | Admitting: Gastroenterology

## 2016-11-25 ENCOUNTER — Ambulatory Visit (INDEPENDENT_AMBULATORY_CARE_PROVIDER_SITE_OTHER): Payer: Medicare Other | Admitting: Internal Medicine

## 2016-11-25 ENCOUNTER — Encounter: Payer: Self-pay | Admitting: Internal Medicine

## 2016-11-25 ENCOUNTER — Telehealth: Payer: Self-pay | Admitting: Rheumatology

## 2016-11-25 VITALS — BP 116/70 | HR 76 | Ht 63.0 in | Wt 154.0 lb

## 2016-11-25 DIAGNOSIS — M069 Rheumatoid arthritis, unspecified: Secondary | ICD-10-CM

## 2016-11-25 DIAGNOSIS — R0602 Shortness of breath: Secondary | ICD-10-CM | POA: Insufficient documentation

## 2016-11-25 DIAGNOSIS — R0689 Other abnormalities of breathing: Secondary | ICD-10-CM

## 2016-11-25 DIAGNOSIS — R0600 Dyspnea, unspecified: Secondary | ICD-10-CM | POA: Diagnosis not present

## 2016-11-25 NOTE — Progress Notes (Signed)
Subjective:    Patient ID: Tony Mcmillan, male    DOB: [birth year], 75 y.o.   MRN: [id number]  HPI  OV 11/25/2016  Chief Complaint  Patient presents with  . Pulmonary Consult    Pt c/o SOB when active and at rest and c/o orthopnea, occassional dry cough. Pt c/o epigastric pain. Pt saw cardiology in 09/2016.     45 -year-old male referred for dyspnea  He is an extremely poor historian. History is these by talking to him and review of the referral to rheumatology notes and cardiology notes. As best as I can gather he has seronegative rheumatoid arthritis. He admits that he was noncompliant with rheumatology follow-up. He finally saw Dr. Estanislado Pandy on 11/06/2016 and possibly even before that. He's been started on methotrexate newly in the last few months according to his history. Since then he's had insidious onset of dyspnea. The dyspnea is random but also present with exertion. He is unable to quantify severity or progress or aggravating or relieving factors or associated symptoms although he says he does not have any associated cough he does seem to have some associated wheezing. Review of the chart shows cardiac echocardiogram shows grade 1 diastolic dysfunction due to medicine cardiac stress test negative in October 2017. He has been reassured by the cardiologist. His hemoglobin is 14 g percent and a creatinine of 1.08 mg percent on the same day. Walking desaturation test 185 feet 3 laps on room air: Did not desaturate but he did get dyspneic. Chest x-ray 09/23/2016 personally visualized and clear   ent has a past medical history of Anemia; Arthritis; Blood transfusion without reported diagnosis; Emphysema of lung ([place]) (02/08/2016); and GERD (gastroesophageal reflux disease).   reports that he quit smoking about 22 years ago. His smoking use included Cigarettes. He has a 17.00 pack-year smoking history. He has never used smokeless tobacco.  Past Surgical History:  Procedure Laterality Date    . ANTERIOR CERVICAL CORPECTOMY    . ANTERIOR CERVICAL CORPECTOMY  12/2014   for infection. this was in [place]  . CATARACT EXTRACTION     both eyes  . COLOSTOMY REVERSAL     had infection on buttocks and had to have a skin graft- had colostomy to help area stay clean and heal  . HERNIA REPAIR    . SPINE SURGERY      No Known Allergies  Immunization History  Administered Date(s) Administered  . Influenza-Unspecified 10/30/2015, 09/13/2016  . Pneumococcal Conjugate-13 12/05/2015  . Tdap 12/09/2015  . Zoster 12/09/2015    Family History  Problem Relation Age of Onset  . Hypertension Mother   . Hypertension Father   . Diabetes Father   . Hyperlipidemia Sister   . Diabetes Sister   . Stroke Brother   . Diabetes Brother   . Colon polyps Neg Hx   . Esophageal cancer Neg Hx   . Stomach cancer Neg Hx   . Rectal cancer Neg Hx   . Colon cancer Neg Hx      Current Outpatient Prescriptions:  .  Cholecalciferol (VITAMIN D3) 50000 units CAPS, Take 50,000 Units by mouth 2 (two) times a week. Wednesdays and Sundays, Disp: , Rfl:  .  ferrous sulfate 325 (65 FE) MG tablet, TAKE 1 TABLET BY MOUTH TWICE A DAY WITH MEALS FOR 3 MONTH, Disp: 30 tablet, Rfl: 0 .  folic acid (FOLVITE) 1 MG tablet, Take 2 mg by mouth daily., Disp: , Rfl:  .  methotrexate (RHEUMATREX) 2.5 MG  tablet, Take 8 tablets (20 mg total) by mouth once a week. Caution:Chemotherapy. Protect from light., Disp: 96 tablet, Rfl: 0 .  omeprazole (PRILOSEC) 20 MG capsule, Take 20 mg by mouth daily., Disp: , Rfl:    Review of Systems  Constitutional: Negative for fever and unexpected weight change.  HENT: Negative for congestion, dental problem, ear pain, nosebleeds, postnasal drip, rhinorrhea, sinus pressure, sneezing, sore throat and trouble swallowing.   Eyes: Negative for redness and itching.  Respiratory: Positive for shortness of breath. Negative for cough, chest tightness and wheezing.   Cardiovascular: Negative for  palpitations and leg swelling.  Gastrointestinal: Negative for nausea and vomiting.  Genitourinary: Negative for dysuria.  Musculoskeletal: Negative for joint swelling.  Skin: Negative for rash.  Neurological: Negative for headaches.  Hematological: Does not bruise/bleed easily.  Psychiatric/Behavioral: Negative for dysphoric mood. The patient is not nervous/anxious.        Objective:   Physical Exam  Constitutional: He is oriented to person, place, and time. He appears well-developed and well-nourished. No distress.  Dressed as a Air traffic controller  HENT:  Head: Normocephalic and atraumatic.  Right Ear: External ear normal.  Left Ear: External ear normal.  Mouth/Throat: Oropharynx is clear and moist. No oropharyngeal exudate.  Eyes: Conjunctivae and EOM are normal. Pupils are equal, round, and reactive to light. Right eye exhibits no discharge. Left eye exhibits no discharge. No scleral icterus.  Neck: Normal range of motion. Neck supple. No JVD present. No tracheal deviation present. No thyromegaly present.  Cardiovascular: Normal rate, regular rhythm and intact distal pulses.  Exam reveals no gallop and no friction rub.   No murmur heard. Pulmonary/Chest: Effort normal and breath sounds normal. No respiratory distress. He has no wheezes. He has no rales. He exhibits no tenderness.  Abdominal: Soft. Bowel sounds are normal. He exhibits no distension and no mass. There is no tenderness. There is no rebound and no guarding.  Musculoskeletal: Normal range of motion. He exhibits no edema or tenderness.  Lymphadenopathy:    He has no cervical adenopathy.  Neurological: He is alert and oriented to person, place, and time. He has normal reflexes. No cranial nerve deficit. Coordination normal.  Skin: Skin is warm and dry. No rash noted. He is not diaphoretic. No erythema. No pallor.  Psychiatric: He has a normal mood and affect. His behavior is normal. Judgment and thought content normal.    Nursing note and vitals reviewed.   Vitals:   11/25/16 1238  BP: 116/70  Pulse: 76  SpO2: 95%  Weight: 154 lb (69.9 kg)  Height: 5\' 3"  (1.6 m)   Estimated body mass index is 27.28 kg/m as calculated from the following:   Height as of this encounter: 5\' 3"  (1.6 m).   Weight as of this encounter: 154 lb (69.9 kg).         Assessment & Plan:     ICD-9-CM ICD-10-CM   1. Dyspnea and respiratory abnormality 786.09 R06.00 CT CHEST HIGH RESOLUTION    R06.89 Pulmonary Function Test     Pulse oximetry, overnight  2. Rheumatoid arthritis involving multiple sites, unspecified rheumatoid factor presence (HCC) 714.0 M06.9 CT CHEST HIGH RESOLUTION    Due to presence of rheumatoid arthritis first thing to rule out his interstitial lung disease. It appears on echocardiogram pulmonary hypertension is deemed unlikely. Therefore we will do pulmonary function test, high-resolution CT chest and overnight oxygen test. If these are negative we will test for asthma with an exhaled nitric oxide  test and if that is negative to we'll do a pulmonary stress test. I do not think symptoms are related to methotrexate because this was just started recently. He verbalized understanding and is in agreement with the plan. He will return to see me or my nurse practitioner in the next few to several weeks after completion of the test   Dr. Brand Males, M.D., [facility].C.P Pulmonary and Critical Care Medicine Staff Physician [place] [facility] Pager: [phone], If no answer or between  15:00h - 7:00h: call [phone]  11/25/2016 1:07 PM

## 2016-11-25 NOTE — Telephone Encounter (Signed)
Patient would like to know if he needs to keep taking vitamin D pills? He says he only has one left. Please call patient.

## 2016-11-25 NOTE — Patient Instructions (Signed)
ICD-9-CM ICD-10-CM   1. Dyspnea and respiratory abnormality 786.09 R06.00     R06.89   2. Rheumatoid arthritis involving multiple sites, unspecified rheumatoid factor presence (HCC) 714.0 M06.9     Do PFt, HRCT, Ono test and return for followup If negative, will do feno v cpst test  followup  next few weeks after completing above - ok  To see APP

## 2016-11-26 ENCOUNTER — Ambulatory Visit (INDEPENDENT_AMBULATORY_CARE_PROVIDER_SITE_OTHER): Payer: Medicare Other | Admitting: Internal Medicine

## 2016-11-26 ENCOUNTER — Other Ambulatory Visit: Payer: Self-pay | Admitting: *Deleted

## 2016-11-26 DIAGNOSIS — R5381 Other malaise: Secondary | ICD-10-CM | POA: Diagnosis not present

## 2016-11-26 DIAGNOSIS — R0689 Other abnormalities of breathing: Secondary | ICD-10-CM

## 2016-11-26 DIAGNOSIS — E559 Vitamin D deficiency, unspecified: Secondary | ICD-10-CM | POA: Diagnosis not present

## 2016-11-26 DIAGNOSIS — R0600 Dyspnea, unspecified: Secondary | ICD-10-CM

## 2016-11-26 DIAGNOSIS — R5383 Other fatigue: Principal | ICD-10-CM

## 2016-11-26 LAB — PULMONARY FUNCTION TEST
DL/VA % pred: 98 %
DL/VA: 3.99 ml/min/mmHg/L
DLCO cor % pred: 53 %
DLCO cor: 12.31 ml/min/mmHg
DLCO unc % pred: 53 %
DLCO unc: 12.16 ml/min/mmHg
FEF 25-75 Post: 1.5 L/sec
FEF 25-75 Pre: 1.44 L/sec
FEF2575-%Change-Post: 4 %
FEF2575-%Pred-Post: 90 %
FEF2575-%Pred-Pre: 86 %
FEV1-%Change-Post: 1 %
FEV1-%Pred-Post: 74 %
FEV1-%Pred-Pre: 73 %
FEV1-Post: 1.48 L
FEV1-Pre: 1.46 L
FEV1FVC-%Change-Post: 0 %
FEV1FVC-%Pred-Pre: 106 %
FEV6-%Change-Post: 1 %
FEV6-%Pred-Post: 70 %
FEV6-%Pred-Pre: 69 %
FEV6-Post: 1.81 L
FEV6-Pre: 1.79 L
FEV6FVC-%Pred-Post: 107 %
FEV6FVC-%Pred-Pre: 107 %
FVC-%Change-Post: 1 %
FVC-%Pred-Post: 65 %
FVC-%Pred-Pre: 65 %
FVC-Post: 1.81 L
FVC-Pre: 1.79 L
Post FEV1/FVC ratio: 82 %
Post FEV6/FVC ratio: 100 %
Pre FEV1/FVC ratio: 81 %
Pre FEV6/FVC Ratio: 100 %
RV % pred: 86 %
RV: 1.87 L
TLC % pred: 64 %
TLC: 3.64 L

## 2016-11-26 NOTE — Telephone Encounter (Signed)
Patient advised he would need to have his Vitamin D level rechecked to see if he would need another prescription or if he would be able to use the OTC Vitamin D. Patient plans to come by the office today for lab.

## 2016-11-26 NOTE — Progress Notes (Signed)
pft  

## 2016-11-27 LAB — VITAMIN D 25 HYDROXY (VIT D DEFICIENCY, FRACTURES): Vit D, 25-Hydroxy: 126 ng/mL — ABNORMAL HIGH (ref 30–100)

## 2016-11-27 NOTE — Progress Notes (Signed)
Discontinue vitamin D recheck levels in 6 months

## 2016-11-29 DIAGNOSIS — R0600 Dyspnea, unspecified: Secondary | ICD-10-CM | POA: Diagnosis not present

## 2016-12-02 ENCOUNTER — Other Ambulatory Visit: Payer: Self-pay | Admitting: Gastroenterology

## 2016-12-03 ENCOUNTER — Ambulatory Visit (INDEPENDENT_AMBULATORY_CARE_PROVIDER_SITE_OTHER)
Admission: RE | Admit: 2016-12-03 | Discharge: 2016-12-03 | Disposition: A | Discharge status: Home / Self Care | Payer: Medicare Other | Source: Ambulatory Visit | Attending: Internal Medicine | Admitting: Internal Medicine

## 2016-12-03 DIAGNOSIS — R0600 Dyspnea, unspecified: Secondary | ICD-10-CM | POA: Diagnosis not present

## 2016-12-03 DIAGNOSIS — R0689 Other abnormalities of breathing: Secondary | ICD-10-CM

## 2016-12-03 DIAGNOSIS — R0602 Shortness of breath: Secondary | ICD-10-CM | POA: Diagnosis not present

## 2016-12-03 DIAGNOSIS — M069 Rheumatoid arthritis, unspecified: Secondary | ICD-10-CM

## 2016-12-04 ENCOUNTER — Telehealth: Payer: Self-pay | Admitting: Internal Medicine

## 2016-12-04 DIAGNOSIS — J438 Other emphysema: Secondary | ICD-10-CM

## 2016-12-04 NOTE — Telephone Encounter (Signed)
Tony Mcmillan   You are giong to see [name] patient for dyspnea on 12/27. Looks lik cardiac stres sis fine. CT shos emphysema. No ILD. REc COPd rx and rehab  Dr. Brand Males, M.D., [facility].C.P Pulmonary and Critical Care Medicine Staff Physician [place] [facility] Pager: [phone], If no answer or between  15:00h - 7:00h: call [phone]  12/04/2016 11:58 AM

## 2016-12-10 ENCOUNTER — Telehealth: Payer: Self-pay | Admitting: Internal Medicine

## 2016-12-10 NOTE — Telephone Encounter (Signed)
Spoke with pt. He was confused why [facility] called him because he stated someone had already came to his house for him to do his ono, but I looked in epic and no other orders from anyone from our office or in [place] placed an order for this. I called the pt. Back and asked does he know who came to his house, like the company did they give him any information he stated that he did not have any information. I informed him well we have no idea of how we would get the information so he would have to get the another one through [facility] to assure we get the report. [name] he would contact them about getting it done. Nothing further is needed at this time.

## 2016-12-13 ENCOUNTER — Telehealth: Payer: Self-pay | Admitting: Internal Medicine

## 2016-12-13 NOTE — Telephone Encounter (Signed)
Is ono 11/29/16 shows pulse ox </= 88% at 46 min. Start 1L [place] at night. Keep appt with TP  Dr. Brand Males, M.D., [facility].C.P Pulmonary and Critical Care Medicine Staff Physician [place] [facility] Pager: [phone], If no answer or between  15:00h - 7:00h: call [phone]  12/13/2016 7:57 PM

## 2016-12-14 ENCOUNTER — Other Ambulatory Visit: Payer: Self-pay | Admitting: Gastroenterology

## 2016-12-15 ENCOUNTER — Other Ambulatory Visit: Payer: Self-pay | Admitting: Rheumatology

## 2016-12-18 ENCOUNTER — Telehealth: Payer: Self-pay | Admitting: Internal Medicine

## 2016-12-18 ENCOUNTER — Ambulatory Visit (INDEPENDENT_AMBULATORY_CARE_PROVIDER_SITE_OTHER): Payer: Medicare Other | Admitting: Adult Health

## 2016-12-18 ENCOUNTER — Encounter: Payer: Self-pay | Admitting: Adult Health

## 2016-12-18 DIAGNOSIS — J9611 Chronic respiratory failure with hypoxia: Secondary | ICD-10-CM

## 2016-12-18 DIAGNOSIS — J439 Emphysema, unspecified: Secondary | ICD-10-CM

## 2016-12-18 DIAGNOSIS — J9610 Chronic respiratory failure, unspecified whether with hypoxia or hypercapnia: Secondary | ICD-10-CM | POA: Insufficient documentation

## 2016-12-18 HISTORY — DX: Emphysema, unspecified: J43.9

## 2016-12-18 MED ORDER — TIOTROPIUM BROMIDE MONOHYDRATE 2.5 MCG/ACT IN AERS
2.0000 | INHALATION_SPRAY | Freq: Every day | RESPIRATORY_TRACT | 0 refills | Status: DC
Start: 2016-12-18 — End: 2017-01-07

## 2016-12-18 NOTE — Assessment & Plan Note (Signed)
PFT shows some restriction /Dfiffucing defect, no significant airflow obstruction  (Emphysema w/ no  ILD on CT chest )  Trial of Spiriva .   Plan  Patient Instructions  Begin Oxygen 1l/m At bedtime  .  Refer to Pulmonary rehab  Begin Spiriva 2 puffs daily  follow up Dr. Chase Caller in 2 months and As needed

## 2016-12-18 NOTE — Addendum Note (Signed)
Addended by: Jannette Spanner on: 12/18/2016 12:30 PM   Modules accepted: Orders

## 2016-12-18 NOTE — Patient Instructions (Signed)
Begin Oxygen 1l/m At bedtime  .  Refer to Pulmonary rehab  Begin Spiriva 2 puffs daily  follow up Dr. Chase Caller in 2 months and As needed

## 2016-12-18 NOTE — Addendum Note (Signed)
Addended by: Benson Setting L on: 12/18/2016 12:18 PM   Modules accepted: Orders

## 2016-12-18 NOTE — Telephone Encounter (Signed)
The PCC's are aware of this., will forward to them, I believe Rodena Piety was working on this

## 2016-12-18 NOTE — Assessment & Plan Note (Signed)
Mild desats on ONO  Begin O2 1lm/ At bedtime

## 2016-12-18 NOTE — Progress Notes (Signed)
@Patient  ID: Tony Mcmillan, male    DOB: Nov 03, 1941, 75 y.o.   MRN: [id number]  Chief Complaint  Patient presents with  . Follow-up    Dyspnea     Referring provider: Girtha Rm, NP  HPI: 75 year old male former smoker seen for pulmonary consult 11/25/2016 with Dr. Chase Caller for dyspnea. 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 Cardiac stress test negative in October 2017 Walk test in the office December 2017 did not show any type of desaturation. Chest x-ray October 2017 showed clear lungs High resolution CT chest 12/03/2016 showed no ILD, mild emphysema, mild diffuse bronchial wall thickening, focal area of scarring in the right upper lobe ono 11/29/16 shows pulse ox </= 88% at 46 min. Start 1L [place]  Pulmonary function test done 11/26/2016 showed an FEV1 at 74%, ratio 82, FVC 65%, no bronchodilator response, ERV 138%, DLCO 53%   12/18/2016 Follow up ; Dyspnea  Pt presents for for a 3 week follow up . Seen earlier this month for pulmonary consult for dyspnea . Over last few months noticed he gets winded with prolonged walking . He had recent cardiac stress test that was neg.  He was set up for ONO, PFT and HRCT Chest  Pulmonary function test done 11/26/2016 showed an FEV1 at 74%, ratio 82, FVC 65%, no bronchodilator response, ERV 138%, DLCO 53% Overnight oximetry test did show nocturnal desaturations. We discussed those results and recommended to start  on 1 L of oxygen at bedtime. HRCT showed  no ILD, mild emphysema, mild diffuse bronchial wall thickening, focal area of scarring in the right upper lobe ono 11/29/16 shows pulse ox </= 88% at 46 min. Start 1L [place]  We discussed his dx of Emphysema. And nocturnal hypoxemia .     No Known Allergies  Immunization History  Administered Date(s) Administered  . Influenza-Unspecified 10/30/2015, 09/13/2016  . Pneumococcal Conjugate-13 12/05/2015    . Tdap 12/09/2015  . Zoster 12/09/2015    Past Medical History:  Diagnosis Date  . Anemia   . Arthritis   . Blood transfusion without reported diagnosis   . Emphysema of lung ([facility]) 02/08/2016  . GERD (gastroesophageal reflux disease)     Tobacco History: History  Smoking Status  . Former Smoker  . Packs/day: 1.00  . Years: 17.00  . Types: Cigarettes  . Quit date: 12/23/1993  Smokeless Tobacco  . Never Used   Counseling given: Not Answered   Outpatient Encounter Prescriptions as of 12/18/2016  Medication Sig  . ferrous sulfate 325 (65 FE) MG tablet TAKE 1 TABLET TWICE A DAY WITH MEALS FOR 3 MONTHS  . folic acid (FOLVITE) 1 MG tablet Take 2 mg by mouth daily.  . methotrexate (RHEUMATREX) 2.5 MG tablet Take 8 tablets (20 mg total) by mouth once a week. Caution:Chemotherapy. Protect from light.  . [DISCONTINUED] Cholecalciferol (VITAMIN D3) 50000 units CAPS Take 50,000 Units by mouth 2 (two) times a week. Wednesdays and Sundays  . [DISCONTINUED] omeprazole (PRILOSEC) 20 MG capsule Take 20 mg by mouth daily.   No facility-administered encounter medications on file as of 12/18/2016.      Review of Systems  Constitutional:   No  weight loss, night sweats,  Fevers, chills, + fatigue, or  lassitude.  HEENT:   No headaches,  Difficulty swallowing,  Tooth/dental problems, or  Sore throat,  No sneezing, itching, ear ache, nasal congestion, post nasal drip,   CV:  No chest pain,  Orthopnea, PND, swelling in lower extremities, anasarca, dizziness, palpitations, syncope.   GI  No heartburn, indigestion, abdominal pain, nausea, vomiting, diarrhea, change in bowel habits, loss of appetite, bloody stools.   Resp:    No chest wall deformity  Skin: no rash or lesions.  GU: no dysuria, change in color of urine, no urgency or frequency.  No flank pain, no hematuria   MS:  No joint pain or swelling.  No decreased range of motion.  No back pain.    Physical  Exam  BP 122/72 (BP Location: Left Arm, Cuff Size: Normal)   Pulse 67   Ht 5\' 3"  (1.6 m)   Wt 155 lb 9.6 oz (70.6 kg)   SpO2 98%   BMI 27.56 kg/m   GEN: A/Ox3; pleasant , NAD, elderly     HEENT:  [place]/AT,  EACs-clear, TMs-wnl, NOSE-clear, THROAT-clear, no lesions, no postnasal drip or exudate noted.   NECK:  Supple w/ fair ROM; no JVD; normal carotid impulses w/o bruits; no thyromegaly or nodules palpated; no lymphadenopathy.    RESP  Decreased BS in bases w/o, wheezes/ rales/ or rhonchi. no accessory muscle use, no dullness to percussion  CARD:  RRR, no m/r/g, no peripheral edema, pulses intact, no cyanosis or clubbing.  GI:   Soft & nt; nml bowel sounds; no organomegaly or masses detected.   Musco: Warm bil, no deformities or joint swelling noted.   Neuro: alert, no focal deficits noted.    Skin: Warm, no lesions or rashes  Psych:  No change in mood or affect. No depression or anxiety.  No memory loss.  Lab Results:  CBC    Component Value Date/Time   WBC 5.3 11/06/2016 1336   RBC 4.94 11/06/2016 1336   HGB 14.0 11/06/2016 1336   HCT 40.9 11/06/2016 1336   PLT 286 11/06/2016 1336   MCV 82.8 11/06/2016 1336   MCH 28.3 11/06/2016 1336   MCHC 34.2 11/06/2016 1336   RDW 17.1 (H) 11/06/2016 1336   LYMPHSABS 1,007 11/06/2016 1336   MONOABS 583 11/06/2016 1336   EOSABS 106 11/06/2016 1336   BASOSABS 0 11/06/2016 1336    BMET    Component Value Date/Time   NA 137 11/06/2016 1336   K 4.1 11/06/2016 1336   CL 104 11/06/2016 1336   CO2 24 11/06/2016 1336   GLUCOSE 108 (H) 11/06/2016 1336   BUN 11 11/06/2016 1336   CREATININE 1.08 11/06/2016 1336   CALCIUM 9.1 11/06/2016 1336   GFRNONAA 67 11/06/2016 1336   GFRAA 78 11/06/2016 1336    BNP No results found for: BNP  ProBNP No results found for: PROBNP  Imaging: Ct Chest High Resolution  Result Date: 12/03/2016 CLINICAL DATA:  75 year old male with history of dyspnea and respiratory difficulty. Rheumatoid  arthritis. Shortness of breath. History of emphysema. EXAM: CT CHEST WITHOUT CONTRAST TECHNIQUE: Multidetector CT imaging of the chest was performed following the standard protocol without intravenous contrast. High resolution imaging of the lungs, as well as inspiratory and expiratory imaging, was performed. COMPARISON:  No priors. FINDINGS: Cardiovascular: Heart size is normal. There is no significant pericardial fluid, thickening or pericardial calcification. There is aortic atherosclerosis, as well as atherosclerosis of the great vessels of the mediastinum and the coronary arteries, including calcified atherosclerotic plaque in the left main, left anterior descending, left circumflex and right coronary arteries. Calcifications of the aortic valve (  mild). Mediastinum/Nodes: No pathologically enlarged mediastinal or hilar lymph nodes. Please note that accurate exclusion of hilar adenopathy is limited on noncontrast CT scans. Esophagus is unremarkable in appearance. No axillary lymphadenopathy. Lungs/Pleura: Elevation of the right hemidiaphragm. No confluent consolidative airspace disease. No pleural effusions. Mild diffuse bronchial wall thickening with mild centrilobular emphysema. Small calcified granuloma in the left lower lobe. 3 mm pulmonary nodule in the right upper lobe near the apex (image 24 of series 5). No other larger more suspicious appearing pulmonary nodules or masses are noted. There is a focal area of architectural distortion in the posterior aspect of the right upper lobe adjacent to the major fissure, best appreciated on image 53 of series 5, where there appears to be several bronchi with more focal thickening of the peribronchovascular interstitium. High-resolution images otherwise demonstrate no significant regions of ground-glass attenuation, septal thickening, parenchymal banding, traction bronchiectasis or frank honeycombing. Inspiratory and expiratory imaging demonstrates some mild air  trapping, indicative of small airways disease. Upper Abdomen: Unremarkable. Musculoskeletal: There are no aggressive appearing lytic or blastic lesions noted in the visualized portions of the skeleton. IMPRESSION: 1. No findings to suggest interstitial lung disease. 2. Mild diffuse bronchial wall thickening with mild centrilobular emphysema; imaging findings suggestive of underlying COPD. 3. Focal area of scarring in the posterior aspect of the right upper lobe likely from prior infection. 4. Aortic atherosclerosis, in addition to left main and 3 vessel coronary artery disease. Assessment for potential risk factor modification, dietary therapy or pharmacologic therapy may be warranted, if clinically indicated. 5. There are mild calcifications of the aortic valve. Echocardiographic correlation for evaluation of potential valvular dysfunction may be warranted if clinically indicated. Electronically Signed   By: Vinnie Langton M.D.   On: 12/03/2016 13:49     Assessment & Plan:   No problem-specific Assessment & Plan notes found for this encounter.     Rexene Edison, NP 12/18/2016

## 2016-12-19 ENCOUNTER — Other Ambulatory Visit: Payer: Self-pay

## 2016-12-19 DIAGNOSIS — R0600 Dyspnea, unspecified: Secondary | ICD-10-CM

## 2016-12-19 DIAGNOSIS — J9610 Chronic respiratory failure, unspecified whether with hypoxia or hypercapnia: Secondary | ICD-10-CM

## 2016-12-24 ENCOUNTER — Telehealth: Payer: Self-pay | Admitting: Internal Medicine

## 2016-12-24 MED ORDER — TIOTROPIUM BROMIDE MONOHYDRATE 2.5 MCG/ACT IN AERS: 2.0000 | INHALATION_SPRAY | Freq: Every day | RESPIRATORY_TRACT | 4 refills | Status: DC

## 2016-12-24 NOTE — Telephone Encounter (Signed)
Called and spoke to pt. Informed him of the results per MR. Pt verbalized understanding and denied any further questions or concerns at this time.  

## 2016-12-24 NOTE — Telephone Encounter (Signed)
Called and spoke with patient regarding refill. Spiriva was sent in to patient's pharmacy. Nothing else was needed.

## 2016-12-24 NOTE — Telephone Encounter (Signed)
PCC's please advise. thanks 

## 2016-12-24 NOTE — Telephone Encounter (Signed)
Somehow this order got sent to [facility] without documentation and also sent to [facility] and Judeen Hammans had documented this note. [facility] did an ONO on 11/29/2016 but [facility] had not done the ONO yet but was scheduled for 12/27/2016. I sent staff message to [facility] not to worry about doing this ONO because [facility] had already done the ONO on 11/29/2016/ The 02 order was then sent to [facility] with confirmation from Maudie Mercury that she did get the 02 order. She was much happier

## 2016-12-25 NOTE — Telephone Encounter (Signed)
[name] calling from pulm rehab and wanting to speak to nurse about this pt [phone].Tony Mcmillan

## 2016-12-25 NOTE — Telephone Encounter (Signed)
Spoke with Tony Mcmillan at [facility]monary Rehab. They need a new order for pulmonary rehab. TP can't order pulmonary rehab, only MD's can. New order has been placed. Nothing further was needed.

## 2017-01-07 ENCOUNTER — Encounter: Payer: Self-pay | Admitting: Family Medicine

## 2017-01-07 ENCOUNTER — Ambulatory Visit (INDEPENDENT_AMBULATORY_CARE_PROVIDER_SITE_OTHER): Payer: Medicare Other | Admitting: Family Medicine

## 2017-01-07 VITALS — BP 120/80 | HR 96 | Temp 98.6°F | Resp 18 | Wt 152.4 lb

## 2017-01-07 DIAGNOSIS — R0981 Nasal congestion: Secondary | ICD-10-CM | POA: Diagnosis not present

## 2017-01-07 DIAGNOSIS — J439 Emphysema, unspecified: Secondary | ICD-10-CM | POA: Diagnosis not present

## 2017-01-07 DIAGNOSIS — R05 Cough: Secondary | ICD-10-CM

## 2017-01-07 DIAGNOSIS — R059 Cough, unspecified: Secondary | ICD-10-CM

## 2017-01-07 LAB — POC INFLUENZA A&B (BINAX/QUICKVUE)
Influenza A, POC: NEGATIVE
Influenza B, POC: NEGATIVE

## 2017-01-07 MED ORDER — FLUTICASONE PROPIONATE 50 MCG/ACT NA SUSP: 2.0000 | Freq: Every day | NASAL | 1 refills | Status: DC

## 2017-01-07 MED ORDER — BENZONATATE 200 MG PO CAPS: 200.0000 mg | ORAL_CAPSULE | Freq: Two times a day (BID) | ORAL | 0 refills | Status: DC | PRN

## 2017-01-07 NOTE — Progress Notes (Signed)
Subjective:  Tony Mcmillan is a 76 y.o. male who presents for a 24 hour history of rhinorrhea, post nasal drainage, dry cough, and scratchy throat.  He is being followed by his pulmonologist for chronic respiratory failure, emphysema and is using oxygen 1L at night.  Denies feeling any more short of breath than his norm. He starts pulmonary rehab next week.   Denies fever, chills, body aches, ear pain, chest pain, palpitations, shortness of breath, wheezing, abdominal pain, N/V/D.   Treatment to date: none.  Denies sick contacts.  No other aggravating or relieving factors.  No other c/o.  ROS as in subjective.   Objective: Vitals:   01/07/17 1326 01/07/17 1350  BP: 120/80   Pulse: 96   Resp: 20 18  Temp: 98.6 F (37 C)     General appearance: Alert, WD/WN, no distress, mildly ill appearing                             Skin: warm, no rash                           Head: no sinus tenderness                            Eyes: conjunctiva normal, corneas clear, PERRLA                            Ears: pearly TMs, external ear canals normal                          Nose: septum midline, turbinates swollen, with erythema and clear discharge             Mouth/throat: MMM, tongue normal, mild pharyngeal erythema                           Neck: supple, no adenopathy, no thyromegaly, nontender                          Heart: RRR, normal S1, S2, no murmurs                         Lungs: CTA bilaterally, no wheezes, rales, or rhonchi      Assessment: Nasal congestion - Plan: POC Influenza A&B(BINAX/QUICKVUE), fluticasone (FLONASE) 50 MCG/ACT nasal spray  Cough - Plan: POC Influenza A&B(BINAX/QUICKVUE)  Pulmonary emphysema, unspecified emphysema type (HCC)   Plan: Discussed diagnosis and treatment of URI.  Discussed that flu test is negative. His symptoms appear to be related to a viral illness. He is not in any distress and at baseline in regards to his respiratory functioning. Tessalon and  Flonase sent to pharmacy. Suggested symptomatic OTC remedies. Nasal saline spray for congestion.  Tylenol or Ibuprofen OTC for fever and malaise.  Discussed that I will have a very low threshold to put him on antibiotics or steroids if needed based on his underlying respiratory disease. He will call/return if symptoms aren't resolving.

## 2017-01-07 NOTE — Patient Instructions (Signed)
Take the Tessalon capsules as needed for cough. You can use the Flonase for nasal congestion and drainage. Stay well hydrated. If you get worse call me.

## 2017-01-13 ENCOUNTER — Encounter (HOSPITAL_COMMUNITY)
Admission: RE | Admit: 2017-01-13 | Discharge: 2017-01-13 | Disposition: A | Discharge status: Home / Self Care | Payer: Medicare Other | Source: Ambulatory Visit | Attending: Internal Medicine | Admitting: Internal Medicine

## 2017-01-13 ENCOUNTER — Encounter (HOSPITAL_COMMUNITY): Payer: Self-pay

## 2017-01-13 VITALS — BP 137/80 | HR 84 | Ht 62.0 in | Wt 150.8 lb

## 2017-01-13 DIAGNOSIS — J438 Other emphysema: Secondary | ICD-10-CM | POA: Diagnosis not present

## 2017-01-13 DIAGNOSIS — J439 Emphysema, unspecified: Secondary | ICD-10-CM

## 2017-01-13 NOTE — Progress Notes (Signed)
Tony Mcmillan 76 y.o. male Pulmonary Rehab Orientation Note Patient arrived today in [facility] for orientation to Pulmonary Rehab. He was transported from [employer] via wheel chair. He does not carry portable oxygen. Per pt, he uses oxygen at night when going to sleep. Color good, skin warm and dry. Patient is oriented to time and place. Patient's medical history, psychosocial health, and medications reviewed. Psychosocial assessment reveals pt lives alone. Pt is currently retired. Pt hobbies include watching al sports and going to his grandchildren's games. Pt reports his stress level is low.  states he has no stress.  Pt does not exhibit signs of depression. PHQ2/9 score 0/0. Pt shows good  coping skills with positive outlook . Will continue to monitor and evaluate for a change in psychosocial status.  Physical assessment reveals heart rate is normal, breath sounds clear to auscultation, no wheezes, rales, or rhonchi. Grip strength equal, strong. Distal pulses 3+ bilateral posterior tibial pulses present without peripheral edema. Patient reports he does take medications as prescribed. Patient states he follows a Regular diet. The patient reports no specific efforts to gain or lose weight.. Patient's weight will be monitored closely. Demonstration and practice of PLB using pulse oximeter. Patient able to return demonstration satisfactorily. Safety and hand hygiene in the exercise area reviewed with patient. Patient voices understanding of the information reviewed. Department expectations discussed with patient and achievable goals were set. The patient shows enthusiasm about attending the program and we look forward to working with this nice gentleman. The patient is scheduled for a 6 min walk test on Tuesday, January 16, 2017 @ 3:45 pm and to begin exercise on Thursday, January 23, 2017 in the 1030 class.   [id number]

## 2017-01-16 ENCOUNTER — Encounter (HOSPITAL_COMMUNITY)
Admission: RE | Admit: 2017-01-16 | Discharge: 2017-01-16 | Disposition: A | Discharge status: Home / Self Care | Payer: Medicare Other | Source: Ambulatory Visit | Attending: Internal Medicine | Admitting: Internal Medicine

## 2017-01-16 DIAGNOSIS — J439 Emphysema, unspecified: Secondary | ICD-10-CM

## 2017-01-16 DIAGNOSIS — J438 Other emphysema: Secondary | ICD-10-CM | POA: Diagnosis not present

## 2017-01-16 NOTE — Progress Notes (Signed)
Pulmonary Individual Treatment Plan  Patient Details  Name: Tony Mcmillan MRN: [id number] Date of Birth: 1941-09-21 Referring Provider:   April Manson Pulmonary Rehab Walk Test from 01/16/2017 in [place]  Referring Provider  Dr. Chase Caller      Initial Encounter Date:  Flowsheet Row Pulmonary Rehab Walk Test from 01/16/2017 in [place]  Date  01/16/17  Referring Provider  Dr. Chase Caller      Visit Diagnosis: Pulmonary emphysema, unspecified emphysema type ([place])  Patient's Home Medications on Admission:   Current Outpatient Prescriptions:  .  benzonatate (TESSALON) 200 MG capsule, Take 1 capsule (200 mg total) by mouth 2 (two) times daily as needed for cough., Disp: 20 capsule, Rfl: 0 .  ferrous sulfate 325 (65 FE) MG tablet, TAKE 1 TABLET TWICE A DAY WITH MEALS FOR 3 MONTHS (Patient not taking: Reported on 01/13/2017), Disp: 30 tablet, Rfl: 0 .  fluticasone (FLONASE) 50 MCG/ACT nasal spray, Place 2 sprays into both nostrils daily., Disp: 16 g, Rfl: 1 .  folic acid (FOLVITE) 1 MG tablet, Take 2 mg by mouth daily., Disp: , Rfl:  .  methotrexate (RHEUMATREX) 2.5 MG tablet, Take 8 tablets (20 mg total) by mouth once a week. Caution:Chemotherapy. Protect from light., Disp: 96 tablet, Rfl: 0 .  Tiotropium Bromide Monohydrate (SPIRIVA RESPIMAT) 2.5 MCG/ACT AERS, Inhale 2 puffs into the lungs daily., Disp: 4 g, Rfl: 4  Past Medical History: Past Medical History:  Diagnosis Date  . Anemia   . Arthritis   . Blood transfusion without reported diagnosis   . Emphysema of lung ([place]) 02/08/2016  . GERD (gastroesophageal reflux disease)     Tobacco Use: History  Smoking Status  . Former Smoker  . Packs/day: 1.00  . Years: 17.00  . Types: Cigarettes  . Quit date: 12/23/1993  Smokeless Tobacco  . Never Used    Labs: Recent Review Flowsheet Data    Labs for ITP Cardiac and Pulmonary Rehab Latest Ref Rng & Units 03/19/2016    Cholestrol 125 - 200 mg/dL 206(H)   LDLCALC <130 mg/dL 115   HDL >=40 mg/dL 49   Trlycerides <150 mg/dL 210(H)      Capillary Blood Glucose: No results found for: GLUCAP   ADL UCSD:   Pulmonary Function Assessment:     Pulmonary Function Assessment - 01/13/17 1248      Breath   Bilateral Breath Sounds Clear   Shortness of Breath No      Exercise Target Goals: Date: 01/16/17  Exercise Program Goal: Individual exercise prescription set with THRR, safety & activity barriers. Participant demonstrates ability to understand and report RPE using BORG scale, to self-measure pulse accurately, and to acknowledge the importance of the exercise prescription.  Exercise Prescription Goal: Starting with aerobic activity 30 plus minutes a day, 3 days per week for initial exercise prescription. Provide home exercise prescription and guidelines that participant acknowledges understanding prior to discharge.  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 HR 127 bpm     Max Ex. BP 138/84       Interval HR   Baseline HR 94     1 Minute HR 112     2 Minute HR 126     3 Minute HR 128     4 Minute HR 118     5 Minute HR 124     6 Minute HR 127     2 Minute Post HR 92     Interval Heart Rate? Yes       Interval Oxygen   Interval Oxygen? Yes     Baseline Oxygen Saturation % 94 %     Baseline Liters of Oxygen 0 L     1 Minute Oxygen  Saturation % 95 %     1 Minute Liters of Oxygen 0 L     2 Minute Oxygen Saturation % 94 %     2 Minute Liters of Oxygen 0 L     3 Minute Oxygen Saturation % 95 %     3 Minute Liters of Oxygen 0 L     4 Minute Oxygen Saturation % 95 %     4 Minute Liters of Oxygen 0 L     5 Minute Oxygen Saturation % 96 %     5 Minute Liters of Oxygen 0 L     6 Minute Oxygen Saturation % 96 %     6 Minute Liters of Oxygen 0 L     2 Minute Post Oxygen Saturation % 97 %     2 Minute Post Liters of Oxygen 0 L        Initial Exercise Prescription:     Initial Exercise Prescription - 01/16/17 1600      Date of Initial Exercise RX and Referring Provider   Date 01/16/17   Referring Provider Dr. Chase Caller     Oxygen   Oxygen --     Bike   Level 0.5   Minutes 17     NuStep   Level 2   Minutes 17   METs 1.5     Track   Laps 5   Minutes 17     Prescription Details   Frequency (times per week) 2   Duration Progress to 45 minutes of aerobic exercise without signs/symptoms of physical distress     Intensity   THRR 40-80% of Max Heartrate 58-117   Ratings of Perceived Exertion 11-13   Perceived Dyspnea 0-4     Progression   Progression Continue progressive overload as per policy without signs/symptoms or physical distress.     Resistance Training   Training Prescription Yes   Weight blue bands   Reps 10-12      Perform Capillary Blood Glucose checks as needed.  Exercise Prescription Changes:   Exercise Comments:   Discharge Exercise Prescription (Final Exercise Prescription Changes):    Nutrition:  Target Goals: Understanding of nutrition guidelines, daily intake of sodium 1500mg , cholesterol 200mg , calories 30% from fat and 7% or less from saturated fats, daily to have 5  or more servings of fruits and vegetables.  Biometrics:     Pre Biometrics - 01/13/17 1251      Pre Biometrics   Grip Strength 22 kg       Nutrition Therapy Plan and Nutrition  Goals:   Nutrition Discharge: Rate Your Plate Scores:   Psychosocial: Target Goals: Acknowledge presence or absence of depression, maximize coping skills, provide positive support system. Participant is able to verbalize types and ability to use techniques and skills needed for reducing stress and depression.  Initial Review & Psychosocial Screening:     Initial Psych Review & Screening - 01/13/17 1254      Initial Review   Current issues with --  none identified     Family Dynamics   Good Support System? Yes     Barriers   Psychosocial barriers to participate in program There are no identifiable barriers or psychosocial needs.     Screening Interventions   Interventions Encouraged to exercise      Quality of Life Scores:   PHQ-9: Recent Review Flowsheet Data    Depression screen [facility] 2/9 01/13/2017 12/05/2015   Decreased Interest 0 0   Down, Depressed, Hopeless 0 0   PHQ - 2 Score 0 0      Psychosocial Evaluation and Intervention:     Psychosocial Evaluation - 01/13/17 1254      Psychosocial Evaluation & Interventions   Interventions Encouraged to exercise with the program and follow exercise prescription   Continued Psychosocial Services Needed No     Discharge Psychosocial Assessment & Intervention   Discharge Continue support measures as needed      Psychosocial Re-Evaluation:  Education: Education Goals: Education classes will be provided on a weekly basis, covering required topics. Participant will state understanding/return demonstration of topics presented.  Learning Barriers/Preferences:     Learning Barriers/Preferences - 01/13/17 1248      Learning Barriers/Preferences   Learning Barriers None   Learning Preferences Video;Written Material;Verbal Instruction;Skilled Demonstration;Pictoral;Individual Instruction;Group Instruction;Audio      Education Topics: Risk Factor Reduction:  -Group instruction that is supported by a PowerPoint  presentation. Instructor discusses the definition of a risk factor, different risk factors for pulmonary disease, and how the heart and lungs work together.     Nutrition for Pulmonary Patient:  -Group instruction provided by PowerPoint slides, verbal discussion, and written materials to support subject matter. The instructor gives an explanation and review of healthy diet recommendations, which includes a discussion on weight management, recommendations for fruit and vegetable consumption, as well as protein, fluid, caffeine, fiber, sodium, sugar, and alcohol. Tips for eating when patients are short of breath are discussed.   Pursed Lip Breathing:  -Group instruction that is supported by demonstration and informational handouts. Instructor discusses the benefits of pursed lip and diaphragmatic breathing and detailed demonstration on how to preform both.     Oxygen Safety:  -Group instruction provided by PowerPoint, verbal discussion, and written material to support subject matter. There is an overview of "What is Oxygen" and "Why do we need it".  Instructor also reviews how to create a safe environment for oxygen use, the importance of using oxygen as prescribed, and the risks of noncompliance. There is a brief discussion on traveling with oxygen and resources the patient may utilize.   Oxygen Equipment:  -Group instruction provided by [facility] Staff utilizing handouts, written materials, and equipment demonstrations.   Signs and Symptoms:  -Group instruction provided by written material and verbal discussion to  support subject matter. Warning signs and symptoms of infection, stroke, and heart attack are reviewed and when to call the physician/911 reinforced. Tips for preventing the spread of infection discussed.   Advanced Directives:  -Group instruction provided by verbal instruction and written material to support subject matter. Instructor reviews Advanced Directive laws and proper  instruction for filling out document.   Pulmonary Video:  -Group video education that reviews the importance of medication and oxygen compliance, exercise, good nutrition, pulmonary hygiene, and pursed lip and diaphragmatic breathing for the pulmonary patient.   Exercise for the Pulmonary Patient:  -Group instruction that is supported by a PowerPoint presentation. Instructor discusses benefits of exercise, core components of exercise, frequency, duration, and intensity of an exercise routine, importance of utilizing pulse oximetry during exercise, safety while exercising, and options of places to exercise outside of rehab.     Pulmonary Medications:  -Verbally interactive group education provided by instructor with focus on inhaled medications and proper administration.   Anatomy and Physiology of the Respiratory System and Intimacy:  -Group instruction provided by PowerPoint, verbal discussion, and written material to support subject matter. Instructor reviews respiratory cycle and anatomical components of the respiratory system and their functions. Instructor also reviews differences in obstructive and restrictive respiratory diseases with examples of each. Intimacy, Sex, and Sexuality differences are reviewed with a discussion on how relationships can change when diagnosed with pulmonary disease. Common sexual concerns are reviewed.   Knowledge Questionnaire Score:   Core Components/Risk Factors/Patient Goals at Admission:     Personal Goals and Risk Factors at Admission - 01/13/17 1253      Core Components/Risk Factors/Patient Goals on Admission   Sedentary Yes   Increase Strength and Stamina Yes      Core Components/Risk Factors/Patient Goals Review:      Goals and Risk Factor Review    Row Name 01/13/17 1253             Core Components/Risk Factors/Patient Goals Review   Personal Goals Review Sedentary;Increase Strength and Stamina          Core Components/Risk  Factors/Patient Goals at Discharge (Final Review):      Goals and Risk Factor Review - 01/13/17 1253      Core Components/Risk Factors/Patient Goals Review   Personal Goals Review Sedentary;Increase Strength and Stamina      ITP Comments:   Comments:

## 2017-01-17 ENCOUNTER — Telehealth: Payer: Self-pay | Admitting: Internal Medicine

## 2017-01-17 NOTE — Telephone Encounter (Signed)
Pt calling back to check on the status of request I let him know that we were waithing on response from [place]

## 2017-01-17 NOTE — Telephone Encounter (Deleted)
MR please advise. Thanks! 

## 2017-01-17 NOTE — Telephone Encounter (Signed)
Spoke with states, he uses 1L qhs. Pt states his sister passed and he will be traveling to [place] tomorrow. Pt is wanting to know if he can go without O2 for 3 days. I have advised pt that we can order a POC to take with him. Pt states he will need this today.  MR please advise. Thanks.

## 2017-01-17 NOTE — Telephone Encounter (Signed)
Per MR verbally-pt should be okay to go with O2 qhs for 3 nights while visting family. Pt aware and voiced her understanding. Nothing further needed.

## 2017-01-22 ENCOUNTER — Other Ambulatory Visit: Payer: Self-pay | Admitting: Rheumatology

## 2017-01-22 NOTE — Telephone Encounter (Signed)
ok 

## 2017-01-22 NOTE — Telephone Encounter (Signed)
Last Visit: 11/06/16  Next Visit: 03/06/17 Labs: 11/06/16 WNL  Okay to refill MTX?

## 2017-01-23 ENCOUNTER — Encounter (HOSPITAL_COMMUNITY)
Admission: RE | Admit: 2017-01-23 | Discharge: 2017-01-23 | Disposition: A | Discharge status: Home / Self Care | Payer: Medicare Other | Source: Ambulatory Visit | Attending: Internal Medicine | Admitting: Internal Medicine

## 2017-01-23 VITALS — Wt 150.6 lb

## 2017-01-23 DIAGNOSIS — J438 Other emphysema: Secondary | ICD-10-CM | POA: Diagnosis not present

## 2017-01-23 DIAGNOSIS — J439 Emphysema, unspecified: Secondary | ICD-10-CM

## 2017-01-23 NOTE — Progress Notes (Signed)
Daily Session Note  Patient Details  Name: Tony Mcmillan MRN: [id number] Date of Birth: 16-Apr-1941 Referring Provider:   April Manson Pulmonary Rehab Walk Test from 01/16/2017 in [place]  Referring Provider  Dr. Chase Caller      Encounter Date: 01/23/2017  Check In:     Session Check In - 01/23/17 1030      Check-In   Location [facility]   Staff Present Rosebud Poles, RN, BSN;Lisa Ysidro Evert, RN;Portia Rollene Rotunda, RN, BSN   Supervising physician immediately available to respond to emergencies [facility] immediately available   Physician(s) Dr. Broadus John   Medication changes reported     No   Fall or balance concerns reported    No   Warm-up and Cool-down Performed as group-led instruction   Resistance Training Performed Yes     Pain Assessment   Currently in Pain? No/denies   Multiple Pain Sites No      Capillary Blood Glucose: No results found for this or any previous visit (from the past 24 hour(s)).      Exercise Prescription Changes - 01/23/17 1200      Response to Exercise   Blood Pressure (Admit) 104/60   Blood Pressure (Exercise) 106/72   Blood Pressure (Exit) 120/60   Heart Rate (Admit) 83 bpm   Heart Rate (Exercise) 94 bpm   Heart Rate (Exit) 78 bpm   Oxygen Saturation (Admit) 95 %   Oxygen Saturation (Exercise) 96 %   Oxygen Saturation (Exit) 96 %   Rating of Perceived Exertion (Exercise) 11   Perceived Dyspnea (Exercise) 1   Duration Progress to 45 minutes of aerobic exercise without signs/symptoms of physical distress   Intensity --  40-80% HRR     Progression   Progression Continue to progress workloads to maintain intensity without signs/symptoms of physical distress.     Resistance Training   Training Prescription Yes   Weight blue bands   Reps 10-12  10 minutes of strength training     Interval Training   Interval Training No     Bike   Level 0.5   Minutes 17     Track   Laps 8   Minutes  17     Goals Met:  Exercise tolerated well Strength training completed today  Goals Unmet:  Not Applicable  Comments: Service time is from 1030 to 1230    Dr. Rush Farmer is Medical Director for Pulmonary Rehab at [facility].

## 2017-01-24 ENCOUNTER — Other Ambulatory Visit: Payer: Self-pay | Admitting: Gastroenterology

## 2017-01-24 DIAGNOSIS — R195 Other fecal abnormalities: Secondary | ICD-10-CM

## 2017-01-24 DIAGNOSIS — D509 Iron deficiency anemia, unspecified: Secondary | ICD-10-CM

## 2017-01-28 ENCOUNTER — Encounter (HOSPITAL_COMMUNITY)
Admission: RE | Admit: 2017-01-28 | Discharge: 2017-01-28 | Disposition: A | Discharge status: Home / Self Care | Payer: Medicare Other | Source: Ambulatory Visit | Attending: Internal Medicine | Admitting: Internal Medicine

## 2017-01-28 VITALS — Wt 151.5 lb

## 2017-01-28 DIAGNOSIS — R3121 Asymptomatic microscopic hematuria: Secondary | ICD-10-CM | POA: Diagnosis not present

## 2017-01-28 DIAGNOSIS — J438 Other emphysema: Secondary | ICD-10-CM | POA: Diagnosis not present

## 2017-01-28 DIAGNOSIS — J439 Emphysema, unspecified: Secondary | ICD-10-CM

## 2017-01-28 DIAGNOSIS — N281 Cyst of kidney, acquired: Secondary | ICD-10-CM | POA: Diagnosis not present

## 2017-01-28 DIAGNOSIS — N400 Enlarged prostate without lower urinary tract symptoms: Secondary | ICD-10-CM | POA: Diagnosis not present

## 2017-01-28 NOTE — Progress Notes (Signed)
Daily Session Note  Patient Details  Name: Tony Mcmillan MRN: [id number] Date of Birth: Oct 16, 1941 Referring Provider:   April Manson Pulmonary Rehab Walk Test from 01/16/2017 in [place]  Referring Provider  Dr. Chase Caller      Encounter Date: 01/28/2017  Check In:     Session Check In - 01/28/17 1030      Check-In   Location [facility]   Staff Present Rosebud Poles, RN, BSN; , MS, ACSM RCEP, Exercise Physiologist;Lisa Ysidro Evert, RN;Portia Rollene Rotunda, RN, BSN   Supervising physician immediately available to respond to emergencies [facility] immediately available   Physician(s) Dr. Cathlean Sauer   Medication changes reported     No   Fall or balance concerns reported    No   Warm-up and Cool-down Performed as group-led instruction   Resistance Training Performed Yes   VAD Patient? No     Pain Assessment   Currently in Pain? No/denies   Multiple Pain Sites No      Capillary Blood Glucose: No results found for this or any previous visit (from the past 24 hour(s)).      Exercise Prescription Changes - 01/28/17 1200      Response to Exercise   Blood Pressure (Admit) 114/60   Blood Pressure (Exercise) 150/74   Blood Pressure (Exit) 114/70   Heart Rate (Admit) 79 bpm   Heart Rate (Exercise) 109 bpm   Heart Rate (Exit) 82 bpm   Oxygen Saturation (Admit) 92 %   Oxygen Saturation (Exercise) 92 %   Oxygen Saturation (Exit) 95 %   Rating of Perceived Exertion (Exercise) 13   Perceived Dyspnea (Exercise) 1   Duration Progress to 45 minutes of aerobic exercise without signs/symptoms of physical distress   Intensity --  40-80% HRR     Progression   Progression Continue to progress workloads to maintain intensity without signs/symptoms of physical distress.     Resistance Training   Training Prescription Yes   Weight blue bands   Reps 10-12  10 minutes of strength training     Interval Training   Interval  Training No     Bike   Level 0.5   Minutes 17     NuStep   Level 2   Minutes 17   METs 1.9     Track   Laps 8   Minutes 17     Goals Met:  Exercise tolerated well No report of cardiac concerns or symptoms Strength training completed today  Goals Unmet:  Not Applicable  Comments: Service time is from 10:30a to 12:05p    Dr. Rush Farmer is Medical Director for Pulmonary Rehab at [facility].

## 2017-01-30 ENCOUNTER — Encounter (HOSPITAL_COMMUNITY)
Admission: RE | Admit: 2017-01-30 | Discharge: 2017-01-30 | Disposition: A | Discharge status: Home / Self Care | Payer: Medicare Other | Source: Ambulatory Visit | Attending: Internal Medicine | Admitting: Internal Medicine

## 2017-01-30 VITALS — Wt 150.8 lb

## 2017-01-30 DIAGNOSIS — J438 Other emphysema: Secondary | ICD-10-CM | POA: Diagnosis not present

## 2017-01-30 DIAGNOSIS — J439 Emphysema, unspecified: Secondary | ICD-10-CM

## 2017-01-30 NOTE — Progress Notes (Signed)
Daily Session Note  Patient Details  Name: Tony Mcmillan MRN: [id number] Date of Birth: 05/21/1941 Referring Provider:   Flowsheet Row Pulmonary Rehab Walk Test from 01/16/2017 in [facility]  Referring Provider  Dr. Ramaswamy      Encounter Date: 01/30/2017  Check In:     Session Check In - 01/30/17 1302      Check-In   [facility]   Staff Present Joan Behrens, RN, BSN;Portia Payne, RN, BSN;[name] diVincenzo, MS, ACSM RCEP, Exercise Physiologist   Supervising physician immediately available to respond to emergencies [facility] immediately available   Physician(s) Dr. Hongalgi   Medication changes reported     No   Fall or balance concerns reported    No   Warm-up and Cool-down Performed as group-led instruction   Resistance Training Performed Yes   VAD Patient? No     Pain Assessment   Currently in Pain? No/denies   Multiple Pain Sites No      Capillary Blood Glucose: No results found for this or any previous visit (from the past 24 hour(s)).      Exercise Prescription Changes - 01/30/17 1300      Exercise Review   Progression Yes     Response to Exercise   Blood Pressure (Admit) 108/60   Blood Pressure (Exercise) 150/80   Blood Pressure (Exit) 122/70   Heart Rate (Admit) 85 bpm   Heart Rate (Exercise) 103 bpm   Heart Rate (Exit) 90 bpm   Oxygen Saturation (Admit) 94 %   Oxygen Saturation (Exercise) 93 %   Oxygen Saturation (Exit) 94 %   Rating of Perceived Exertion (Exercise) 12   Perceived Dyspnea (Exercise) 1   Duration Progress to 45 minutes of aerobic exercise without signs/symptoms of physical distress   Intensity THRR unchanged     Progression   Progression Continue to progress workloads to maintain intensity without signs/symptoms of physical distress.     Resistance Training   Training Prescription Yes   Weight blue bands   Reps 10-12  10 minutes of strength training     Interval  Training   Interval Training No     NuStep   Level 3   Minutes 17   METs 2.4     Track   Laps 11   Minutes 17     Goals Met:  Exercise tolerated well No report of cardiac concerns or symptoms Strength training completed today  Goals Unmet:  Not Applicable  Comments: Service time is from 1030 to 1240 He attended the Doctor Day class today.    Dr. Wesam G. Yacoub is Medical Director for Pulmonary Rehab at [facility]. 

## 2017-02-04 ENCOUNTER — Telehealth: Payer: Self-pay | Admitting: *Deleted

## 2017-02-04 ENCOUNTER — Encounter (HOSPITAL_COMMUNITY)
Admission: RE | Admit: 2017-02-04 | Discharge: 2017-02-04 | Disposition: A | Discharge status: Home / Self Care | Payer: Medicare Other | Source: Ambulatory Visit | Attending: Internal Medicine | Admitting: Internal Medicine

## 2017-02-04 VITALS — Wt 151.2 lb

## 2017-02-04 DIAGNOSIS — H5213 Myopia, bilateral: Secondary | ICD-10-CM | POA: Diagnosis not present

## 2017-02-04 DIAGNOSIS — J438 Other emphysema: Secondary | ICD-10-CM | POA: Diagnosis not present

## 2017-02-04 DIAGNOSIS — Z961 Presence of intraocular lens: Secondary | ICD-10-CM | POA: Diagnosis not present

## 2017-02-04 DIAGNOSIS — J439 Emphysema, unspecified: Secondary | ICD-10-CM

## 2017-02-04 NOTE — Progress Notes (Signed)
Tony Mcmillan reported chest pressure which was rated a "4" at the end of exercise and reports he has had it "all weekend".  12 lead ekg was done and was normal.  Called and discussed issues with Rip Harbour who is Dr. Blenda Mounts nurse.  Was instructed to let patient go home today and take it easy.  Given an appointment to see Ignacia Bayley, PA tomorrow 02/05/2017 @ 2pm at [employer].  I called  [name] daughter Tony Mcmillan and discussed all this information.  He left with VSS, chest pressure remains a "3" and given instructions to call 911 if his symptoms worsen.

## 2017-02-04 NOTE — Telephone Encounter (Signed)
Received call from Pulmonary Rehab regarding patient Patient has been having intermittent chest pain since Sunday not brought on by exercise. Patient blood pressure and heart rate good during exercise No significant change in EKG Normal myoview in October 2017 Discussed with Dr Oval Linsey DOD and ok to get appointment with PA/NP next available Scheduled appointment with Allie Bossier PA for tomorrow Information given to Rehab to relay to patient

## 2017-02-04 NOTE — Progress Notes (Signed)
Daily Session Note  Patient Details  Name: Tony Mcmillan MRN: [id number] Date of Birth: [birth year] Referring Provider:   April Manson Pulmonary Rehab Walk Test from 01/16/2017 in [place]  Referring Provider  Dr. Chase Caller      Encounter Date: 02/04/2017  Check In:     Session Check In - 02/04/17 1030      Check-In   Location [facility]   Staff Present Rosebud Poles, RN, BSN;[name] diVincenzo, MS, ACSM RCEP, Exercise Physiologist;Lisa Ysidro Evert, RN;Portia Rollene Rotunda, RN, BSN   Supervising physician immediately available to respond to emergencies [facility] immediately available   Physician(s) Dr. Posey Pronto   Medication changes reported     No   Fall or balance concerns reported    No   Warm-up and Cool-down Performed as group-led instruction   Resistance Training Performed Yes   VAD Patient? No     Pain Assessment   Currently in Pain? No/denies   Multiple Pain Sites No      Capillary Blood Glucose: No results found for this or any previous visit (from the past 24 hour(s)).      Exercise Prescription Changes - 02/04/17 1300      Exercise Review   Progression Yes     Response to Exercise   Blood Pressure (Admit) 118/70   Blood Pressure (Exercise) 138/84   Blood Pressure (Exit) 128/80   Heart Rate (Admit) 90 bpm   Heart Rate (Exercise) 113 bpm   Heart Rate (Exit) 98 bpm   Oxygen Saturation (Admit) 94 %   Oxygen Saturation (Exercise) 94 %   Oxygen Saturation (Exit) 94 %   Rating of Perceived Exertion (Exercise) 13   Perceived Dyspnea (Exercise) 1   Duration Progress to 45 minutes of aerobic exercise without signs/symptoms of physical distress   Intensity THRR unchanged     Progression   Progression Continue to progress workloads to maintain intensity without signs/symptoms of physical distress.     Resistance Training   Training Prescription Yes   Weight blue bands   Reps 10-12  10 minutes of strength training      Interval Training   Interval Training No     Bike   Level 0.5   Minutes 17     NuStep   Level 4   Minutes 17   METs 2.5     Track   Laps 14   Minutes 17     Goals Met:  Strength training completed today he had chest pressure at the end of exercise.    Goals Unmet:  Not Applicable  Comments: Had chesp pain at end of exercise session, EKG done and was unremarkable, Melinda at Dr. Blenda Mounts office said patient could go home and has an appointment with Danna Hefty, PA on Wednesday, February 05, 2017 @ 2 pm.     Dr. Rush Farmer is Medical Director for Pulmonary Rehab at [facility]

## 2017-02-05 ENCOUNTER — Ambulatory Visit (INDEPENDENT_AMBULATORY_CARE_PROVIDER_SITE_OTHER): Payer: Medicare Other | Admitting: Nurse Practitioner

## 2017-02-05 ENCOUNTER — Encounter: Payer: Self-pay | Admitting: Nurse Practitioner

## 2017-02-05 VITALS — BP 136/82 | HR 96 | Ht 62.0 in | Wt 153.6 lb

## 2017-02-05 DIAGNOSIS — R0789 Other chest pain: Secondary | ICD-10-CM

## 2017-02-05 NOTE — Progress Notes (Signed)
Office Visit    Patient Name: Tony Mcmillan Date of Encounter: 02/05/2017  Primary Care Provider:  Harland Dingwall, NP Primary Cardiologist:  P. 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  Past Medical History    Past Medical History:  Diagnosis Date  . Anemia   . Atypical chest pain    a. 09/2016 MV: EF 63%, normal perfusion.  . Blood transfusion without reported diagnosis   . Diastolic dysfunction    a. 2015 Echo: EF >55%;  b. 09/2016 Echo: EF 55-60%, no rwma, Gr1 DD, triv AI/MR, mild TR, PASP 81mmHg.  . Emphysema of lung ([place]) 02/08/2016  . GERD (gastroesophageal reflux disease)   . Rheumatoid arthritis [facility])    Past Surgical History:  Procedure Laterality Date  . ANTERIOR CERVICAL CORPECTOMY    . ANTERIOR CERVICAL CORPECTOMY  12/2014   for infection. this was in [place]  . CATARACT EXTRACTION     both eyes  . COLOSTOMY REVERSAL     had infection on buttocks and had to have a skin graft- had colostomy to help area stay clean and heal  . HERNIA REPAIR    . SPINE SURGERY      Allergies  No Known Allergies  History of Present Illness    76 year old male with prior history of rheumatoid arthritis on methotrexate therapy, emphysema followed by pulmonology, and intermittent chest discomfort. He was seen in our office in October by Nell Range, PA-C and underwent follow-up echocardiography, which showed normal LV function and minimal valvular disease. Stress testing was also undertaken and was normal. He has since followed up with pulmonology and has recently enrolled in pulmonary rehabilitation. He started pulmonary rehabilitation last week and he thought he had a good week. At one point over the weekend, he developed focal, right-sided chest tightness rated at a 1-3 out of 10. This is been constant  over the past few days and is not any worse with deep breathing, coughing, palpation, position changes, or activity. He did go to pulmonary rehabilitation yesterday and tolerated it well but then mentioned that he had the symptoms began over the weekend and his session was discontinued. Our office was contacted and follow-up was arranged for today. He is currently in good spirits and says that he has mild focal right sided chest tightness. He denies PND, orthopnea, dizziness, syncope, edema, or early satiety.  Home Medications    Prior to Admission medications   Medication Sig Start Date End Date Taking? Authorizing Provider  fluticasone (FLONASE) 50 MCG/ACT nasal spray Place 2 sprays into both nostrils daily. 01/07/17  Yes Girtha Rm, NP  folic acid (FOLVITE) 1 MG tablet Take 2 mg by mouth daily.   Yes Historical Provider, MD  methotrexate (RHEUMATREX) 2.5 MG tablet TAKE 8 TABLETS BY MOUTH ONCE A WEEK 01/22/17  Yes Bo Merino, MD  Tiotropium Bromide Monohydrate (SPIRIVA RESPIMAT) 2.5 MCG/ACT AERS Inhale 2 puffs into the lungs daily. 12/24/16  Yes Tammy S Parrett, NP    Review of Systems    As above, mild, focal right-sided chest tightness beginning 3-4 days ago. He denies palpitations, dyspnea, PND, orthopnea, dizziness, syncope, edema, or early satiety. All other systems reviewed and are otherwise negative except as noted above.  Physical Exam    VS:  BP 136/82 (BP Location: Right Arm)   Pulse 96  Ht 5\' 2"  (1.575 m)   Wt 153 lb 9.6 oz (69.7 kg)   BMI 28.09 kg/m  , BMI Body mass index is 28.09 kg/m. GEN: Well nourished, well developed, in no acute distress.  HEENT: normal.  Neck: Supple, no JVD, carotid bruits, or masses. Cardiac: RRR, 2/6 systolic murmur heard throughout, no rubs, or gallops. No clubbing, cyanosis, edema.  Radials/DP/PT 2+ and equal bilaterally.  Respiratory:  Respirations regular and unlabored, clear to auscultation bilaterally. GI: Soft, nontender,  nondistended, BS + x 4. MS: no deformity or atrophy. Skin: warm and dry, no rash. Neuro:  Strength and sensation are intact. Psych: Normal affect.  Accessory Clinical Findings    ECG - Regular sinus rhythm, 96, no acute ST or T changes.  Assessment & Plan    1.  Right-sided chest pain/atypical chest pain: Patient with prior history of atypical chest discomfort status post stress testing in October 2017, which showed no evidence of perfusion defects. Echo at that time showed normal LV function with grade 1 diastolic dysfunction. Over the past weekend, he developed mild, focal, right-sided chest tightness. There are no associated or aggravating features. He has continued his usual activities without worsening of symptoms. Pain is not reproducible with palpation, deep breathing, coughing, or position changes. He cannot recall any recent trauma or straining of his right pectoral muscle. Symptoms are atypical for angina and in light of normal ECG today and previously normal ischemic evaluation, I would not pursue any additional ischemic evaluation at this time. I did advise that he could try an over-the-counter nonsteroidal for a short course.  2. Disposition: He may return to pulmonary rehabilitation from my standpoint. Follow-up when necessary.   Murray Hodgkins, NP 02/05/2017, 2:41 PM

## 2017-02-05 NOTE — Patient Instructions (Signed)
Your physician recommends that you schedule a follow-up appointment in: AS NEEDED WITH DR. Johnsie Cancel

## 2017-02-06 ENCOUNTER — Encounter (HOSPITAL_COMMUNITY): Admission: RE | Admit: 2017-02-06 | Payer: Medicare Other | Source: Ambulatory Visit

## 2017-02-06 ENCOUNTER — Telehealth (HOSPITAL_COMMUNITY): Payer: Self-pay | Admitting: Family Medicine

## 2017-02-10 ENCOUNTER — Encounter: Payer: Self-pay | Admitting: Family Medicine

## 2017-02-10 ENCOUNTER — Ambulatory Visit (INDEPENDENT_AMBULATORY_CARE_PROVIDER_SITE_OTHER): Payer: Medicare Other | Admitting: Family Medicine

## 2017-02-10 ENCOUNTER — Telehealth: Payer: Self-pay | Admitting: Nurse Practitioner

## 2017-02-10 VITALS — BP 140/90 | HR 82 | Temp 97.9°F | Wt 153.6 lb

## 2017-02-10 DIAGNOSIS — Z8719 Personal history of other diseases of the digestive system: Secondary | ICD-10-CM

## 2017-02-10 DIAGNOSIS — R079 Chest pain, unspecified: Secondary | ICD-10-CM

## 2017-02-10 MED ORDER — DEXLANSOPRAZOLE 60 MG PO CPDR: 60.0000 mg | DELAYED_RELEASE_CAPSULE | Freq: Every day | ORAL | 0 refills | Status: DC

## 2017-02-10 NOTE — Progress Notes (Signed)
   Subjective:    Patient ID: Tony Mcmillan, male    DOB: [birth year], 76 y.o.   MRN: [id number]  HPI Chief Complaint  Patient presents with  . right sided chest pain    right sided breast spoke to cardiology and they said follow-up with pcp,    He is here with complaints of right sided chest pain that has been ongoing for approximately 2 weeks. Describes pain as a "tightness". Pain is non radiating. States pain is unchanged since seeing the cardiologist. Pain is not aggravated or alleviated by anything.  Pain is not worse with activity, when laying down or leaning forward.   Denies fever, chills, dizziness, palpitations, shortness of breath, orthopnea, cough, abdominal pain, back pain, N/V/D, LE edema.   Reports history of GERD. States he has not noticed having any reflux symptoms. Is not currently taking medication for GERD.   Reviewed allergies, medications, past medical, surgical,  and social history.    Review of Systems Pertinent positives and negatives in the history of present illness.     Objective:   Physical Exam  Constitutional: He is oriented to person, place, and time. He appears well-developed and well-nourished. No distress.  Eyes: Conjunctivae are normal. Pupils are equal, round, and reactive to light.  Cardiovascular: Normal rate, regular rhythm, normal heart sounds and intact distal pulses.  Exam reveals no gallop and no friction rub.   No murmur heard. Pulmonary/Chest: Effort normal and breath sounds normal. No respiratory distress. He exhibits no tenderness.  Neurological: He is alert and oriented to person, place, and time. He has normal strength. No cranial nerve deficit. Coordination normal.  Skin: Skin is warm and dry. No erythema. No pallor.  Psychiatric: He has a normal mood and affect. His speech is normal and behavior is normal. Judgment and thought content normal.   BP 140/90   Pulse 82   Temp 97.9 F (36.6 C) (Oral)   Wt 153 lb 9.6 oz (69.7 kg)    SpO2 98%   BMI 28.09 kg/m       Assessment & Plan:  Right-sided chest pain  History of gastroesophageal reflux (GERD)  Review of recent chest CT, echo and stress test speaks against his pain being related to a cardiopulmonary etiology. He has been evaluated by his cardiologist for the same pain. Discussed that his pain is unchanged and is not worsening. He does have a history of GERD and this is on the list of differentials. Pain is not related to movement and chest wall non tender which speaks to this not being musculoskeletal.  Plan to have him try Dexilant and return in 12-14 days or sooner if symptoms worsen.

## 2017-02-10 NOTE — Patient Instructions (Signed)
Try and keep a record of when your pain is worse. This may be related to reflux.  Take the Dexilant samples. One pill daily, 30 minutes before breakfast and not with any other medications.  Return in 12 days or sooner if you get worse.

## 2017-02-10 NOTE — Telephone Encounter (Signed)
New message      Pt c/o of Chest Pain: STAT if CP now or developed within 24 hours  1. Are you having CP right now?  Yes but it has been going on since last wed 2. Are you experiencing any other symptoms (ex. SOB, nausea, vomiting, sweating)?  Pt states he is having a little sob  3. How long have you been experiencing CP? Since last wed 4. Is your CP continuous or coming and going?  Continuous tightness 5. Have you taken Nitroglycerin?  ?no

## 2017-02-10 NOTE — Telephone Encounter (Signed)
I reviewed with Dr Tamala Julian- Per Dr Smith-recommend follow-up with PCP to evaluate for other causes of right side chest pain, symptoms atypical for angina, normal ECG, and previously normal ischemic evaluation. If PCP feels referral back to cardilogy is appropriate would be glad to see again.  Pt advised, verbalized understanding, agreed with plan.

## 2017-02-10 NOTE — Telephone Encounter (Signed)
Pt states he is continuing to have right side chest pain that he was evaluated for 02/05/17. Pt states the pain has not changed at all in quality or character, it has been off and on since 02/05/17. Pt advised I will review with Dr Tamala Julian.

## 2017-02-11 ENCOUNTER — Encounter (HOSPITAL_COMMUNITY)
Admission: RE | Admit: 2017-02-11 | Discharge: 2017-02-11 | Disposition: A | Discharge status: Home / Self Care | Payer: Medicare Other | Source: Ambulatory Visit | Attending: Internal Medicine | Admitting: Internal Medicine

## 2017-02-11 VITALS — Wt 151.0 lb

## 2017-02-11 DIAGNOSIS — J439 Emphysema, unspecified: Secondary | ICD-10-CM

## 2017-02-11 DIAGNOSIS — J438 Other emphysema: Secondary | ICD-10-CM | POA: Diagnosis not present

## 2017-02-11 NOTE — Progress Notes (Signed)
Daily Session Note  Patient Details  Name: Tony Mcmillan MRN: [id number] Date of Birth: 05-30-1941 Referring Provider:   April Manson Pulmonary Rehab Walk Test from 01/16/2017 in [place]  Referring Provider  Dr. Chase Caller      Encounter Date: 02/11/2017  Check In:     Session Check In - 02/11/17 1030      Check-In   Location [facility]   Staff Present Rosebud Poles, RN, BSN;[name] diVincenzo, MS, ACSM RCEP, Exercise Physiologist; Ysidro Evert, RN;Portia Rollene Rotunda, RN, BSN   Supervising physician immediately available to respond to emergencies [facility] immediately available   Physician(s) Dr. Candiss Norse   Medication changes reported     No   Fall or balance concerns reported    No   Warm-up and Cool-down Performed as group-led instruction   Resistance Training Performed Yes   VAD Patient? No     Pain Assessment   Currently in Pain? No/denies   Multiple Pain Sites No      Capillary Blood Glucose: No results found for this or any previous visit (from the past 24 hour(s)).      Exercise Prescription Changes - 02/11/17 1200      Response to Exercise   Blood Pressure (Admit) 108/76   Blood Pressure (Exercise) 156/80   Blood Pressure (Exit) 102/64   Heart Rate (Admit) 98 bpm   Heart Rate (Exercise) 106 bpm   Heart Rate (Exit) 95 bpm   Oxygen Saturation (Admit) 98 %   Oxygen Saturation (Exercise) 94 %   Oxygen Saturation (Exit) 95 %   Rating of Perceived Exertion (Exercise) 13   Perceived Dyspnea (Exercise) 2   Duration Progress to 45 minutes of aerobic exercise without signs/symptoms of physical distress   Intensity THRR unchanged     Progression   Progression Continue to progress workloads to maintain intensity without signs/symptoms of physical distress.     Resistance Training   Training Prescription Yes   Weight blue bands   Reps 10-12  10 minutes of strength training     Interval Training   Interval Training  No     Bike   Level 0.5   Minutes 17     NuStep   Level 4   Minutes 17   METs 2.4     Track   Laps 13   Minutes 17     Goals Met:  Exercise tolerated well No report of cardiac concerns or symptoms Strength training completed today  Goals Unmet:  Not Applicable  Comments: Service time is from 1030 to 1200    Dr. Rush Farmer is Medical Director for Pulmonary Rehab at [facility].

## 2017-02-13 ENCOUNTER — Encounter (HOSPITAL_COMMUNITY)
Admission: RE | Admit: 2017-02-13 | Discharge: 2017-02-13 | Disposition: A | Discharge status: Home / Self Care | Payer: Medicare Other | Source: Ambulatory Visit | Attending: Internal Medicine | Admitting: Internal Medicine

## 2017-02-13 VITALS — Wt 150.6 lb

## 2017-02-13 DIAGNOSIS — J439 Emphysema, unspecified: Secondary | ICD-10-CM

## 2017-02-13 DIAGNOSIS — J438 Other emphysema: Secondary | ICD-10-CM | POA: Diagnosis not present

## 2017-02-13 NOTE — Progress Notes (Signed)
Daily Session Note  Patient Details  Name: Tony Mcmillan MRN: [id number] Date of Birth: 03/26/1941 Referring Provider:   Flowsheet Row Pulmonary Rehab Walk Test from 01/16/2017 in [facility]  Referring Provider  Dr. Ramaswamy      Encounter Date: 02/13/2017  Check In:     Session Check In - 02/13/17 1242      Check-In   Location [facility]   Staff Present  , MS, ACSM RCEP, Exercise Physiologist;Portia Payne, RN, BSN;Lisa Hughes, RN   Supervising physician immediately available to respond to emergencies [facility] immediately available   Physician(s) Dr. Rai   Medication changes reported     No   Fall or balance concerns reported    No   Warm-up and Cool-down Performed as group-led instruction   Resistance Training Performed Yes   VAD Patient? No     Pain Assessment   Currently in Pain? No/denies   Multiple Pain Sites No      Capillary Blood Glucose: No results found for this or any previous visit (from the past 24 hour(s)).      Exercise Prescription Changes - 02/13/17 1200      Response to Exercise   Blood Pressure (Admit) 124/74   Blood Pressure (Exercise) 140/80   Blood Pressure (Exit) 100/64   Heart Rate (Admit) 86 bpm   Heart Rate (Exercise) 114 bpm   Heart Rate (Exit) 90 bpm   Oxygen Saturation (Admit) 98 %   Oxygen Saturation (Exercise) 91 %   Oxygen Saturation (Exit) 94 %   Rating of Perceived Exertion (Exercise) 12   Perceived Dyspnea (Exercise) 3   Duration Progress to 45 minutes of aerobic exercise without signs/symptoms of physical distress   Intensity THRR unchanged     Progression   Progression Continue to progress workloads to maintain intensity without signs/symptoms of physical distress.     Resistance Training   Training Prescription Yes   Weight blue bands   Reps 10-12  10 minutes of strength training     Interval Training   Interval Training No     NuStep   Level 4   Minutes 17   METs 2.4     Track   Laps 14   Minutes 17     Goals Met:  Exercise tolerated well No report of cardiac concerns or symptoms Strength training completed today  Goals Unmet:  Not Applicable  Comments: Service time is from 10:30a to 12:15p    Dr. Wesam G. Yacoub is Medical Director for Pulmonary Rehab at [facility]. 

## 2017-02-17 ENCOUNTER — Telehealth: Payer: Self-pay | Admitting: Rheumatology

## 2017-02-17 NOTE — Telephone Encounter (Signed)
Patient called stating that he is still having trouble with his arthritis and wants to know if he needs to continue taking his MTX.  I offered an appointment with Dr. Estanislado Pandy on March 19th @ 10:15.  He seemed to be more concerned with the medicine.  He stated that it seemed to be worse, not better.  Cb#[phone].  Thank you

## 2017-02-18 ENCOUNTER — Ambulatory Visit: Payer: Medicare Other | Admitting: Internal Medicine

## 2017-02-18 ENCOUNTER — Encounter (HOSPITAL_COMMUNITY)
Admission: RE | Admit: 2017-02-18 | Discharge: 2017-02-18 | Disposition: A | Discharge status: Home / Self Care | Payer: Medicare Other | Source: Ambulatory Visit | Attending: Internal Medicine | Admitting: Internal Medicine

## 2017-02-18 VITALS — Wt 149.5 lb

## 2017-02-18 DIAGNOSIS — J439 Emphysema, unspecified: Secondary | ICD-10-CM

## 2017-02-18 DIAGNOSIS — J438 Other emphysema: Secondary | ICD-10-CM | POA: Diagnosis not present

## 2017-02-18 NOTE — Telephone Encounter (Signed)
Patient states he started seeing a pulmonologist a few months ago and is in rehab now. He states he does not feel like the Methotrexate is helping with his arthritis. Patient states he is having swelling in his wrists and hands and pain in his shoulders. Patient has been scheduled for an appointment on 02/25/17 at 3:30 pm

## 2017-02-18 NOTE — Progress Notes (Signed)
Daily Session Note  Patient Details  Name: Tony Mcmillan MRN: [id number] Date of Birth: 11-Feb-1941 Referring Provider:   April Manson Pulmonary Rehab Walk Test from 01/16/2017 in [place]  Referring Provider  Dr. Chase Caller      Encounter Date: 02/18/2017  Check In:     Session Check In - 02/18/17 1030      Check-In   Location [facility]   Staff Present Rosebud Poles, RN, BSN; , MS, ACSM RCEP, Exercise Physiologist;Lisa Ysidro Evert, RN;Portia Rollene Rotunda, RN, BSN   Supervising physician immediately available to respond to emergencies [facility] immediately available   Physician(s) Dr. Posey Pronto   Medication changes reported     No   Fall or balance concerns reported    No   Tobacco Cessation No Change   Warm-up and Cool-down Performed as group-led instruction   Resistance Training Performed Yes   VAD Patient? No     Pain Assessment   Currently in Pain? No/denies   Multiple Pain Sites No      Capillary Blood Glucose: No results found for this or any previous visit (from the past 24 hour(s)).      Exercise Prescription Changes - 02/18/17 1200      Response to Exercise   Blood Pressure (Admit) 130/66   Blood Pressure (Exercise) 140/72   Blood Pressure (Exit) 108/70   Heart Rate (Admit) 96 bpm   Heart Rate (Exercise) 124 bpm   Heart Rate (Exit) 74 bpm   Oxygen Saturation (Admit) 93 %   Oxygen Saturation (Exercise) 93 %   Oxygen Saturation (Exit) 99 %   Rating of Perceived Exertion (Exercise) 13   Perceived Dyspnea (Exercise) 2   Duration Progress to 45 minutes of aerobic exercise without signs/symptoms of physical distress   Intensity THRR unchanged     Progression   Progression Continue to progress workloads to maintain intensity without signs/symptoms of physical distress.     Resistance Training   Training Prescription Yes   Weight blue bands   Reps 10-15   Time 10 Minutes     Interval Training   Interval Training No     Bike   Level 0.5   Minutes 17     NuStep   Level 4   Minutes 17   METs 2.4     Track   Laps 13   Minutes 17      History  Smoking Status  . Former Smoker  . Packs/day: 1.00  . Years: 17.00  . Types: Cigarettes  . Quit date: 12/23/1993  Smokeless Tobacco  . Never Used    Goals Met:  Exercise tolerated well No report of cardiac concerns or symptoms Strength training completed today  Goals Unmet:  Not Applicable  Comments: Service time is from 10:30am to 12:05p    Dr. Rush Farmer is Medical Director for Pulmonary Rehab at [facility].

## 2017-02-20 ENCOUNTER — Encounter (HOSPITAL_COMMUNITY)
Admission: RE | Admit: 2017-02-20 | Discharge: 2017-02-20 | Disposition: A | Discharge status: Home / Self Care | Payer: Medicare Other | Source: Ambulatory Visit | Attending: Internal Medicine | Admitting: Internal Medicine

## 2017-02-20 VITALS — Wt 149.7 lb

## 2017-02-20 DIAGNOSIS — J438 Other emphysema: Secondary | ICD-10-CM | POA: Insufficient documentation

## 2017-02-20 DIAGNOSIS — J439 Emphysema, unspecified: Secondary | ICD-10-CM

## 2017-02-20 NOTE — Progress Notes (Signed)
Daily Session Note  Patient Details  Name: Tony Mcmillan MRN: [id number] Date of Birth: 30-Mar-1941 Referring Provider:   April Manson Pulmonary Rehab Walk Test from 01/16/2017 in [place]  Referring Provider  Dr. Chase Caller      Encounter Date: 02/20/2017  Check In:     Session Check In - 02/20/17 1030      Check-In   Location [facility]   Staff Present Rosebud Poles, RN, BSN;[name] diVincenzo, MS, ACSM RCEP, Exercise Physiologist;Lisa Ysidro Evert, RN; Rollene Rotunda, RN, BSN   Supervising physician immediately available to respond to emergencies [facility] immediately available   Physician(s) Dr. Sloan Leiter   Medication changes reported     No   Fall or balance concerns reported    No   Tobacco Cessation No Change   Warm-up and Cool-down Performed as group-led instruction   Resistance Training Performed Yes   VAD Patient? No     Pain Assessment   Currently in Pain? No/denies   Multiple Pain Sites No      Capillary Blood Glucose: No results found for this or any previous visit (from the past 24 hour(s)).      Exercise Prescription Changes - 02/20/17 1231      Response to Exercise   Blood Pressure (Admit) 118/80   Blood Pressure (Exercise) 166/80   Blood Pressure (Exit) 108/60   Heart Rate (Admit) 94 bpm   Heart Rate (Exercise) 134 bpm   Heart Rate (Exit) 86 bpm   Oxygen Saturation (Admit) 93 %   Oxygen Saturation (Exercise) 93 %   Oxygen Saturation (Exit) 96 %   Rating of Perceived Exertion (Exercise) 13   Perceived Dyspnea (Exercise) 2   Duration Progress to 45 minutes of aerobic exercise without signs/symptoms of physical distress   Intensity THRR unchanged     Progression   Progression Continue to progress workloads to maintain intensity without signs/symptoms of physical distress.     Resistance Training   Training Prescription Yes   Weight blue bands   Reps 10-15   Time 10 Minutes     Interval Training   Interval Training No     Bike   Level 0.8   Minutes 17     Track   Laps 14   Minutes 17      History  Smoking Status  . Former Smoker  . Packs/day: 1.00  . Years: 17.00  . Types: Cigarettes  . Quit date: 12/23/1993  Smokeless Tobacco  . Never Used    Goals Met:  Improved SOB with ADL's Using PLB without cueing & demonstrates good technique Exercise tolerated well No report of cardiac concerns or symptoms Strength training completed today  Goals Unmet:  Not Applicable  Comments: Service time is from 1030 to 1220   Dr. Rush Farmer is Medical Director for Pulmonary Rehab at [facility]

## 2017-02-20 NOTE — Progress Notes (Signed)
Office Visit Note  Patient: Tony Mcmillan             Date of Birth: [birth year]           MRN: [id number]             PCP: Harland Dingwall, NP Referring: Girtha Rm, NP Visit Date: 02/25/2017 Occupation: @GUAROCC @    Subjective:  Pain in bilateral hands.   History of Present Illness: Tony Mcmillan is a 76 y.o. male with history of seronegative severe rheumatoid arthritis. He states she's been having these pain and swelling in his bilateral wrist joints. He's been on methotrexate 8 tablets by mouth every week now for several weeks. He has not had any blood work. He states he continues to have some shortness of breath and chest pain for his been seeing Dr. Chase Caller. He is been diagnosed with emphysema. He was given some new inhalers and he is going to pulmonary rehabilitation.  Activities of Daily Living:  Patient reports morning stiffness for 0 minute.   Patient Reports nocturnal pain.  Difficulty dressing/grooming: Reports Difficulty climbing stairs: Denies Difficulty getting out of chair: Denies Difficulty using hands for taps, buttons, cutlery, and/or writing: Reports   Review of Systems  Constitutional: Positive for fatigue. Negative for night sweats and weakness ( ).  HENT: Negative for mouth sores, mouth dryness and nose dryness.   Eyes: Negative for redness and dryness.  Respiratory: Positive for shortness of breath. Negative for difficulty breathing.   Cardiovascular: Negative for chest pain, palpitations, hypertension, irregular heartbeat and swelling in legs/feet.  Gastrointestinal: Negative for constipation and diarrhea.  Endocrine: Negative for increased urination.  Musculoskeletal: Positive for arthralgias, joint pain, joint swelling and morning stiffness. Negative for myalgias, muscle weakness, muscle tenderness and myalgias.  Skin: Negative for color change, rash, hair loss, nodules/bumps, skin tightness, ulcers and sensitivity to sunlight.  Allergic/Immunologic:  Negative for susceptible to infections.  Neurological: Negative for dizziness, fainting, memory loss and night sweats.  Hematological: Negative for swollen glands.  Psychiatric/Behavioral: Positive for sleep disturbance. Negative for depressed mood. The patient is not nervous/anxious.     PMFS History:  Patient Active Problem List   Diagnosis Date Noted  . Feeling of chest tightness, unclear cause, ? due to RA 02/21/2017  . Emphysema/COPD ([place]) 12/18/2016  . Dyspnea and respiratory abnormality 11/25/2016  . 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[place]) 11/04/2016  . High risk medication use 11/04/2016  . 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 Vitamin D deficiency 11/04/2016  . 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 History of anemia 07/02/2016  . History of colonic polyps 07/02/2016  . History of gastric ulcer 07/02/2016  . Asymptomatic microscopic hematuria 04/03/2016  . Inguinal hernia 04/03/2016  . BPH (benign prostatic hypertrophy) 04/03/2016  . Renal cyst, left 04/03/2016  . 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 DDD (degenerative disc disease), lumbar 04/03/2016  . Colonic polyp 02/23/2016  . Duodenal ulcer 02/23/2016    Past Medical History:  Diagnosis Date  . Anemia   . Atypical chest pain    a. 09/2016 MV: EF 63%, normal perfusion.  . Blood transfusion without reported diagnosis   . Diastolic dysfunction    a. 2015 Echo: EF >55%;  b. 09/2016 Echo: EF 55-60%, no rwma, Gr1 DD, triv AI/MR, mild TR, PASP 98mmHg.  . Emphysema of lung ([place]) 02/08/2016  . GERD (gastroesophageal reflux disease)   . Rheumatoid arthritis ([place])     Family History  Problem Relation Age  of Onset  . Hypertension Mother   . Hypertension Father   . Diabetes Father   . Hyperlipidemia Sister   . Diabetes Sister   . Stroke Brother   . Diabetes Brother   . Colon polyps Neg Hx   . Esophageal cancer Neg Hx   . Stomach cancer Neg Hx   . Rectal cancer Neg Hx   . Colon  cancer Neg Hx    Past Surgical History:  Procedure Laterality Date  . ANTERIOR CERVICAL CORPECTOMY    . ANTERIOR CERVICAL CORPECTOMY  12/2014   for infection. this was in [place]  . CATARACT EXTRACTION     both eyes  . COLOSTOMY REVERSAL     had infection on buttocks and had to have a skin graft- had colostomy to help area stay clean and heal  . HERNIA REPAIR    . SPINE SURGERY     Social History   Social History Narrative   Retired. Lives alone.      Objective: Vital Signs: BP (!) 144/74   Pulse 80   Resp 14   Wt 153 lb (69.4 kg)   BMI 27.98 kg/m    Physical Exam  Constitutional: He is oriented to person, place, and time. He appears well-developed and well-nourished.  HENT:  Head: Normocephalic and atraumatic.  Eyes: Conjunctivae and EOM are normal. Pupils are equal, round, and reactive to light.  Neck: Normal range of motion. Neck supple.  Cardiovascular: Normal rate, regular rhythm and normal heart sounds.   Pulmonary/Chest: Effort normal and breath sounds normal.  Abdominal: Soft. Bowel sounds are normal.  Neurological: He is alert and oriented to person, place, and time.  Skin: Skin is warm and dry. Capillary refill takes less than 2 seconds.  Psychiatric: He has a normal mood and affect. His behavior is normal.  Nursing note and vitals reviewed.    Musculoskeletal Exam: C-spine, thoracic, lumbar spine limited range of motion. Shoulder joints abduction limited to 90 bilaterally, he has contractures in bilateral elbows. He has tenderness and synovitis over bilateral wrist joints. MCPs PIPs DIPs with good range of motion with no synovitis hip joints knee joints ankles MTPs PIPs with good range of motion with no synovitis  CDAI Exam: CDAI Homunculus Exam:   Tenderness:  RUE: glenohumeral, ulnohumeral and radiohumeral and wrist LUE: glenohumeral, ulnohumeral and radiohumeral and wrist  Swelling:  RUE: wrist LUE: wrist  Joint Counts:  CDAI Tender Joint count:  6 CDAI Swollen Joint count: 2  Global Assessments:  Patient Global Assessment: 7 Provider Global Assessment: 7  CDAI Calculated Score: 22    Investigation: Findings:  06/2016:  According to report his left wrist joint x-ray showed erosive changes and intercarpal joint space narrowing.    08/14/2016 X-ray obtained in our office today of his left shoulder joint 3 views shows severe glenohumeral joint space narrowing without chondrocalcinosis.   Bilateral hand x-rays were also obtained which showed osteopenia, bilateral severe intercarpal joint space narrowing, bilateral severe radiocarpal joint space narrowing, left radiocarpal joint was basically fused, bilateral 1st and 2nd MCP joint narrowing, left 5th MCP erosion, left ulnar styloid erosion, carpal joint erosions bilaterally and right radial bone erosion.  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   Left hip joint x-rays showed mild-to-moderate inferomedial narrowing and severe scoliosis was noted and partially visualized lumbar vertebrae.    Bilateral feet x-rays showed all PIP narrowing and left 1st MTP erosive change.  All MTPs were  narrowed in the left foot.  He also had some left talocalcaneal joint space narrowing.  These findings were consistent with inflammatory arthritis most likely rheumatoid arthritis.  Labs from August 14, 2016 show CBC with diff normal, CMP with GFR normal, sed rate normal, acute hepatitis panel negative, TB gold negative, CK normal, TSH normal, HIV normal, IgG slightly elevated at 2035 and we can monitor.  IGA is within normal limits at 468.  IGM is normal.  Rheumatoid factor normal.  ANA normal.  CCP normal.  SPEP normal.  October 2017:  CBC was normal.  Comprehensive metabolic panel was normal.  Albumin low at 3.5.  Vitamin D was 14 which was low.  His labs from September 2017 showed low iron percent saturation of 14, magnesium was normal,  uric acid 7.5, and 14-3-3 eta was negative.    Imaging: Dg Chest 2 View  Result Date: 02/21/2017 CLINICAL DATA:  Persistent cough. EXAM: CHEST  2 VIEW COMPARISON:  No prior. FINDINGS: Mild prominence of the upper mediastinum noted, this is most likely related to great vessels. Hilar structures are normal. Lungs are clear. Mild right base subsegmental atelectasis. No pleural effusion or pneumothorax. Stable elevation right hemidiaphragm. IMPRESSION: No acute cardiopulmonary disease. Electronically Signed   By: Marcello Moores  Register   On: 02/21/2017 13:05    Speciality Comments: No specialty comments available.    Procedures:  No procedures performed Allergies: Patient has no known allergies.   Assessment / Plan:     Visit Diagnoses: Rheumatoid arthritis of multiple sites with negative rheumatoid factor (HCC) - RF negative, CCP negative, 14 33 eta negative, ANA negative, severe erosive disease . He is having a flare with increased pain and swelling to his bilateral wrist joints. He is not responding well to methotrexate 8 tablets by mouth every week. We discussed the option of subcutaneous methotrexate and also adding a biologic therapy. As he has COPD anti-TNF full be a better choice. I'll give him a prednisone taper. Prednisone 5 mg tablet for 4 for 4 days,  3 for 4 days, 2 for 4 days, 1 for 4 days, half for 4 days.  High risk medication use - Methotrexate 8 tabs po qweek, folic acid 2mg  po qd. His labs are stable we'll check his labs again today and then every 3 months to monitor for drug toxicity  DJD (degenerative joint disease), cervical: chronic pain and discomfort  DDD (degenerative disc disease), lumbar: Chronic pain and discomfort  Vitamin D deficiency  History of anemia  History of colonic polyps  History of gastric ulcer  History of COPD  Long term methotrexate user - Plan: CBC with Differential/Platelet, COMPLETE METABOLIC PANEL WITH GFR    Orders: No orders of the  defined types were placed in this encounter.  Meds ordered this encounter  Medications  . predniSONE (DELTASONE) 5 MG tablet    Sig: Take 1 tablet (5 mg total) by mouth daily with breakfast. 4 tab q am x4days, 3 tab q am x4d, 2 tab q amx4 days, 1 tab q am x4d ,1/2 tab qam x4d then  d/c    Dispense:  42 tablet    Refill:  0    Face-to-face time spent with patient was 40 minutes. 50% of time was spent in counseling and coordination of care.  Follow-Up Instructions: Return in about 2 years (around 02/26/2019) for Rheumatoid arthritis.   Bo Merino, MD  Note - This record has been created using Editor, commissioning.  Chart creation errors have  been sought, but may not always  have been located. Such creation errors do not reflect on  the standard of medical care.

## 2017-02-21 ENCOUNTER — Ambulatory Visit (INDEPENDENT_AMBULATORY_CARE_PROVIDER_SITE_OTHER): Payer: Medicare Other | Admitting: Internal Medicine

## 2017-02-21 ENCOUNTER — Ambulatory Visit (INDEPENDENT_AMBULATORY_CARE_PROVIDER_SITE_OTHER)
Admission: RE | Admit: 2017-02-21 | Discharge: 2017-02-21 | Disposition: A | Discharge status: Home / Self Care | Payer: Medicare Other | Source: Ambulatory Visit | Attending: Internal Medicine | Admitting: Internal Medicine

## 2017-02-21 ENCOUNTER — Encounter: Payer: Self-pay | Admitting: Internal Medicine

## 2017-02-21 VITALS — BP 130/74 | HR 80 | Ht 62.0 in | Wt 152.0 lb

## 2017-02-21 DIAGNOSIS — R0789 Other chest pain: Secondary | ICD-10-CM

## 2017-02-21 DIAGNOSIS — J439 Emphysema, unspecified: Secondary | ICD-10-CM

## 2017-02-21 DIAGNOSIS — R05 Cough: Secondary | ICD-10-CM | POA: Diagnosis not present

## 2017-02-21 MED ORDER — GLYCOPYRROLATE-FORMOTEROL 9-4.8 MCG/ACT IN AERO: 2.0000 | INHALATION_SPRAY | Freq: Two times a day (BID) | RESPIRATORY_TRACT | 0 refills | Status: DC

## 2017-02-21 NOTE — Progress Notes (Signed)
Patient seen in the office today and instructed on use of Bevespi.  Patient expressed understanding and demonstrated technique. [facility] 02/21/17

## 2017-02-21 NOTE — Patient Instructions (Signed)
Feeling of chest tightness, unclear cause, ? due to RA Unclear what is causing this right sided chest tightness that is constant Recent CT chest without cause Low risk for blood clot No evidence of right breast lump on exam  Plan  - do cxr 2 view today - will call with results next week  - will scale up your inhaler (see copd section)    Emphysema/COPD ([place]) Stable disease  Plan  - due to chest tightness unexplained that followed starting spiriva, hold spiriva - take bevespi samples - take 2 puff twice daily  Followup 1 month with nurse practitioner to see response

## 2017-02-21 NOTE — Assessment & Plan Note (Signed)
Stable disease  Plan  - due to chest tightness unexplained that followed starting spiriva, hold spiriva - take bevespi samples - take 2 puff twice daily  Followup 1 month with nurse practitioner to see response

## 2017-02-21 NOTE — Assessment & Plan Note (Signed)
Unclear what is causing this right sided chest tightness that is constant Recent CT chest without cause Low risk for blood clot No evidence of right breast lump on exam  Plan  - do cxr 2 view today - will call with results next week  - will scale up your inhaler (see copd section)

## 2017-02-21 NOTE — Progress Notes (Signed)
Subjective:     Patient ID: Tony Mcmillan, male   DOB: 12/03/1941, 76 y.o.   MRN: [id number]  HPI   OV 02/21/2017  Chief Complaint  Patient presents with  . Follow-up    Pt states his breathing is unchanged. Pt c/o right sided chest tightness that is persistent  - since 11/2016. Pt c/o prod cough with clear mucus.     76 year old African-American male with overt arthritis on methotrexate. Has dyspnea on exertion. Workup over the last few months as showing emphysema with isolated reduction in diffusion capacity. He's been started on Spiriva and nocturnal oxygen. He returns for follow-up after institution of these therapies. He tells me that he's not sure if these therapies have helped. He thinks it might have helped. However he is reporting new onset in the last month or 2 but definitely postdating starting of the Spiriva therapy is constant chest tightness on the right breast area. There is no clear cut aggravating factor or relieving factor. It is not a pain but it as a chest tightness. This no radiation. It is mild to moderate but definitely annoying. This no fever or chills or wheezing is definitely seeking relief for this.   has a past medical history of Anemia; Atypical chest pain; Blood transfusion without reported diagnosis; Diastolic dysfunction; Emphysema of lung (HCC) (02/08/2016); GERD (gastroesophageal reflux disease); and Rheumatoid arthritis ([place]).   reports that he quit smoking about 23 years ago. His smoking use included Cigarettes. He has a 17.00 pack-year smoking history. He has never used smokeless tobacco.  Past Surgical History:  Procedure Laterality Date  . ANTERIOR CERVICAL CORPECTOMY    . ANTERIOR CERVICAL CORPECTOMY  12/2014   for infection. this was in [place]  . CATARACT EXTRACTION     both eyes  . COLOSTOMY REVERSAL     had infection on buttocks and had to have a skin graft- had colostomy to help area stay clean and heal  . HERNIA REPAIR    . SPINE SURGERY      No  Known Allergies  Immunization History  Administered Date(s) Administered  . Influenza-Unspecified 10/30/2015, 09/13/2016  . Pneumococcal Conjugate-13 12/05/2015  . Tdap 12/09/2015  . Zoster 12/09/2015    Family History  Problem Relation Age of Onset  . Hypertension Mother   . Hypertension Father   . Diabetes Father   . Hyperlipidemia Sister   . Diabetes Sister   . Stroke Brother   . Diabetes Brother   . Colon polyps Neg Hx   . Esophageal cancer Neg Hx   . Stomach cancer Neg Hx   . Rectal cancer Neg Hx   . Colon cancer Neg Hx      Current Outpatient Prescriptions:  .  dexlansoprazole (DEXILANT) 60 MG capsule, Take 1 capsule (60 mg total) by mouth daily., Disp: 10 capsule, Rfl: 0 .  fluticasone (FLONASE) 50 MCG/ACT nasal spray, Place 2 sprays into both nostrils daily., Disp: 16 g, Rfl: 1 .  folic acid (FOLVITE) 1 MG tablet, Take 2 mg by mouth daily., Disp: , Rfl:  .  methotrexate (RHEUMATREX) 2.5 MG tablet, TAKE 8 TABLETS BY MOUTH ONCE A WEEK, Disp: 96 tablet, Rfl: 0 .  Tiotropium Bromide Monohydrate (SPIRIVA RESPIMAT) 2.5 MCG/ACT AERS, Inhale 2 puffs into the lungs daily., Disp: 4 g, Rfl: 4    Review of Systems     Objective:   Physical Exam  Constitutional: He is oriented to person, place, and time. He appears well-developed and well-nourished. No distress.  HENT:  Head: Normocephalic and atraumatic.  Right Ear: External ear normal.  Left Ear: External ear normal.  Mouth/Throat: Oropharynx is clear and moist. No oropharyngeal exudate.  Restricted neck movement because of rheumatoid arthritis  Eyes: Conjunctivae and EOM are normal. Pupils are equal, round, and reactive to light. Right eye exhibits no discharge. Left eye exhibits no discharge. No scleral icterus.  Neck: Normal range of motion. Neck supple. No JVD present. No tracheal deviation present. No thyromegaly present.  Cardiovascular: Normal rate, regular rhythm and intact distal pulses.  Exam reveals no gallop  and no friction rub.   No murmur heard. Pulmonary/Chest: Effort normal and breath sounds normal. No respiratory distress. He has no wheezes. He has no rales. He exhibits no tenderness.  No breast mass No chest wall tenderness  Abdominal: Soft. Bowel sounds are normal. He exhibits no distension and no mass. There is no tenderness. There is no rebound and no guarding.  Musculoskeletal: Normal range of motion. He exhibits no edema or tenderness.  Uses a cane  Lymphadenopathy:    He has no cervical adenopathy.  Neurological: He is alert and oriented to person, place, and time. He has normal reflexes. No cranial nerve deficit. Coordination normal.  Skin: Skin is warm and dry. No rash noted. He is not diaphoretic. No erythema. No pallor.  Psychiatric: He has a normal mood and affect. His behavior is normal. Judgment and thought content normal.  Nursing note and vitals reviewed.  Vitals:   02/21/17 1131  BP: 130/74  Pulse: 80  SpO2: 94%  Weight: 152 lb (68.9 kg)  Height: 5\' 2"  (1.575 m)    Estimated body mass index is 27.8 kg/m as calculated from the following:   Height as of this encounter: 5\' 2"  (1.575 m).   Weight as of this encounter: 152 lb (68.9 kg).      Assessment:       ICD-9-CM ICD-10-CM   1. Feeling of chest tightness, unclear cause, ? due to RA 786.59 R07.89   2. Pulmonary emphysema, unspecified emphysema type ([place]) 492.8 J43.9        Plan:     Feeling of chest tightness, unclear cause, ? due to RA Unclear what is causing this right sided chest tightness that is constant Recent CT chest without cause Low risk for blood clot No evidence of right breast lump on exam  Plan  - do cxr 2 view today - will call with results next week  - will scale up your inhaler (see copd section)    Emphysema/COPD ([place]) Stable disease  Plan  - due to chest tightness unexplained that followed starting spiriva, hold spiriva - take bevespi samples - take 2 puff twice  daily  Followup 1 month with nurse practitioner to see response    Dr. Brand Males, M.D., [facility].C.P Pulmonary and Critical Care Medicine Staff Physician [facility] Pager: [phone], If no answer or between  15:00h - 7:00h: call [phone]  02/21/2017 12:09 PM

## 2017-02-24 ENCOUNTER — Encounter: Payer: Self-pay | Admitting: Family Medicine

## 2017-02-24 ENCOUNTER — Ambulatory Visit (INDEPENDENT_AMBULATORY_CARE_PROVIDER_SITE_OTHER): Payer: Medicare Other | Admitting: Family Medicine

## 2017-02-24 ENCOUNTER — Telehealth: Payer: Self-pay | Admitting: Internal Medicine

## 2017-02-24 VITALS — BP 130/78 | HR 80 | Wt 152.4 lb

## 2017-02-24 DIAGNOSIS — Z09 Encounter for follow-up examination after completed treatment for conditions other than malignant neoplasm: Secondary | ICD-10-CM

## 2017-02-24 NOTE — Patient Instructions (Signed)
Schedule a medicare wellness visit in the next couple of months. Come in fasting so we can check your cholesterol.

## 2017-02-24 NOTE — Progress Notes (Signed)
   Subjective:    Patient ID: Tony Mcmillan, male    DOB: [birth year], 76 y.o.   MRN: [id number]  HPI Chief Complaint  Patient presents with  . 2 week follow-up    2 week follow-up   He is here for a 2 week follow up on right sided chest pain. States he saw his pulmonologist and was prescribed a new inhaler, Bevespi and stopped Spiriva. States his chest pain goes away completely after using the new inhaler for several hours and then returns until using his inhaler again. States he had a chest XR that was fine also. States he is supposed to follow up his pulmonologist in 3 weeks.  Overall he is feeling better. No concerns or complaints today.   States he has an appointment with Dr. Estanislado Pandy tomorrow for left wrist pain and RA.    Review of Systems Pertinent positives and negatives in the history of present illness.     Objective:   Physical Exam BP 130/78   Pulse 80   Wt 152 lb 6.4 oz (69.1 kg)   BMI 27.87 kg/m   Alert and oriented and in no acute distress. Not otherwise examined.       Assessment & Plan:  Follow up  He seems to be doing better on the new inhaler prescribed by his pulmonologist. He will continue seeing his specialists for lung disease and RA. Reviewed chest XR. Follow up in 1-2 months for an AWV and fasting labs.

## 2017-02-24 NOTE — Telephone Encounter (Signed)
Pt calling for CXR results.  Pt also requesting a prescription be sent to his pharmacy for [place]. Pt states that this inhaler is working well.  MR were you wanting to keep this patient on Bevespi or was it a short term inhaler? Thanks.

## 2017-02-25 ENCOUNTER — Encounter: Payer: Self-pay | Admitting: Rheumatology

## 2017-02-25 ENCOUNTER — Ambulatory Visit (INDEPENDENT_AMBULATORY_CARE_PROVIDER_SITE_OTHER): Payer: Medicare Other | Admitting: Rheumatology

## 2017-02-25 ENCOUNTER — Encounter (HOSPITAL_COMMUNITY)
Admission: RE | Admit: 2017-02-25 | Discharge: 2017-02-25 | Disposition: A | Discharge status: Home / Self Care | Payer: Medicare Other | Source: Ambulatory Visit | Attending: Internal Medicine | Admitting: Internal Medicine

## 2017-02-25 VITALS — BP 144/74 | HR 80 | Resp 14 | Wt 153.0 lb

## 2017-02-25 VITALS — Wt 150.4 lb

## 2017-02-25 DIAGNOSIS — Z79899 Other long term (current) drug therapy: Secondary | ICD-10-CM

## 2017-02-25 DIAGNOSIS — E559 Vitamin D deficiency, unspecified: Secondary | ICD-10-CM

## 2017-02-25 DIAGNOSIS — Z862 Personal history of diseases of the blood and blood-forming organs and certain disorders involving the immune mechanism: Secondary | ICD-10-CM | POA: Diagnosis not present

## 2017-02-25 DIAGNOSIS — Z8719 Personal history of other diseases of the digestive system: Secondary | ICD-10-CM | POA: Diagnosis not present

## 2017-02-25 DIAGNOSIS — Z8711 Personal history of peptic ulcer disease: Secondary | ICD-10-CM

## 2017-02-25 DIAGNOSIS — Z8709 Personal history of other diseases of the respiratory system: Secondary | ICD-10-CM

## 2017-02-25 DIAGNOSIS — Z86010 Personal history of colonic polyps: Secondary | ICD-10-CM | POA: Diagnosis not present

## 2017-02-25 DIAGNOSIS — M47812 Spondylosis without myelopathy or radiculopathy, cervical region: Secondary | ICD-10-CM

## 2017-02-25 DIAGNOSIS — J439 Emphysema, unspecified: Secondary | ICD-10-CM

## 2017-02-25 DIAGNOSIS — M5136 Other intervertebral disc degeneration, lumbar region: Secondary | ICD-10-CM

## 2017-02-25 DIAGNOSIS — M0609 Rheumatoid arthritis without rheumatoid factor, multiple sites: Secondary | ICD-10-CM | POA: Diagnosis not present

## 2017-02-25 DIAGNOSIS — M5030 Other cervical disc degeneration, unspecified cervical region: Secondary | ICD-10-CM | POA: Diagnosis not present

## 2017-02-25 DIAGNOSIS — J438 Other emphysema: Secondary | ICD-10-CM | POA: Diagnosis not present

## 2017-02-25 DIAGNOSIS — Z79631 Long term (current) use of antimetabolite agent: Secondary | ICD-10-CM

## 2017-02-25 LAB — CBC WITH DIFFERENTIAL/PLATELET
Basophils Absolute: 0 cells/uL (ref 0–200)
Basophils Relative: 0 %
Eosinophils Absolute: 102 cells/uL (ref 15–500)
Eosinophils Relative: 2 %
HCT: 40.4 % (ref 38.5–50.0)
Hemoglobin: 13.6 g/dL (ref 13.2–17.1)
Lymphocytes Relative: 20 %
Lymphs Abs: 1020 cells/uL (ref 850–3900)
MCH: 28.2 pg (ref 27.0–33.0)
MCHC: 33.7 g/dL (ref 32.0–36.0)
MCV: 83.8 fL (ref 80.0–100.0)
MPV: 8.8 fL (ref 7.5–12.5)
Monocytes Absolute: 612 cells/uL (ref 200–950)
Monocytes Relative: 12 %
Neutro Abs: 3366 cells/uL (ref 1500–7800)
Neutrophils Relative %: 66 %
Platelets: 279 10*3/uL (ref 140–400)
RBC: 4.82 MIL/uL (ref 4.20–5.80)
RDW: 17.3 % — ABNORMAL HIGH (ref 11.0–15.0)
WBC: 5.1 10*3/uL (ref 3.8–10.8)

## 2017-02-25 LAB — COMPLETE METABOLIC PANEL WITH GFR
ALT: 20 U/L (ref 9–46)
AST: 23 U/L (ref 10–35)
Albumin: 3.8 g/dL (ref 3.6–5.1)
Alkaline Phosphatase: 68 U/L (ref 40–115)
BUN: 11 mg/dL (ref 7–25)
CO2: 26 mmol/L (ref 20–31)
Calcium: 9.2 mg/dL (ref 8.6–10.3)
Chloride: 103 mmol/L (ref 98–110)
Creat: 1.02 mg/dL (ref 0.70–1.18)
GFR, Est African American: 83 mL/min (ref >=60)
GFR, Est Non African American: 72 mL/min (ref >=60)
Glucose, Bld: 86 mg/dL (ref 65–99)
Potassium: 4.2 mmol/L (ref 3.5–5.3)
Sodium: 136 mmol/L (ref 135–146)
Total Bilirubin: 0.7 mg/dL (ref 0.2–1.2)
Total Protein: 7.1 g/dL (ref 6.1–8.1)

## 2017-02-25 MED ORDER — PREDNISONE 5 MG PO TABS: 5.0000 mg | ORAL_TABLET | Freq: Every day | ORAL | 0 refills | Status: DC

## 2017-02-25 NOTE — Progress Notes (Signed)
Daily Session Note  Patient Details  Name: Tony Mcmillan MRN: [id number] Date of Birth: 20-Mar-1941 Referring Provider:   April Manson Pulmonary Rehab Walk Test from 01/16/2017 in [place]  Referring Provider  Dr. Chase Caller      Encounter Date: 02/25/2017  Check In:     Session Check In - 02/25/17 1030      Check-In   Location [facility]   Staff Present Rosebud Poles, RN, BSN;Lisa Ysidro Evert, RN; , MS, ACSM RCEP, Exercise Physiologist;Portia Rollene Rotunda, RN, BSN   Supervising physician immediately available to respond to emergencies [facility] immediately available   Physician(s) Dr. Candiss Norse   Medication changes reported     Yes   Comments Breo added an an inhaler   Fall or balance concerns reported    No   Tobacco Cessation No Change   Warm-up and Cool-down Performed as group-led instruction   Resistance Training Performed Yes   VAD Patient? No     Pain Assessment   Currently in Pain? No/denies   Multiple Pain Sites No      Capillary Blood Glucose: No results found for this or any previous visit (from the past 24 hour(s)).      Exercise Prescription Changes - 02/25/17 1200      Response to Exercise   Blood Pressure (Admit) 114/76   Blood Pressure (Exercise) 126/70   Blood Pressure (Exit) 100/60   Heart Rate (Admit) 79 bpm   Heart Rate (Exercise) 124 bpm   Heart Rate (Exit) 86 bpm   Oxygen Saturation (Admit) 94 %   Oxygen Saturation (Exercise) 92 %   Oxygen Saturation (Exit) 94 %   Rating of Perceived Exertion (Exercise) 13   Perceived Dyspnea (Exercise) 2   Duration Progress to 45 minutes of aerobic exercise without signs/symptoms of physical distress   Intensity THRR unchanged     Progression   Progression Continue to progress workloads to maintain intensity without signs/symptoms of physical distress.     Resistance Training   Training Prescription Yes   Weight blue bands   Reps 10-15   Time 10  Minutes     Interval Training   Interval Training No     Bike   Level 0.8   Minutes 17     NuStep   Level 4   Minutes 17     Track   Laps 15   Minutes 17      History  Smoking Status  . Former Smoker  . Packs/day: 1.00  . Years: 17.00  . Types: Cigarettes  . Quit date: 12/23/1993  Smokeless Tobacco  . Never Used    Goals Met:  Exercise tolerated well No report of cardiac concerns or symptoms Strength training completed today  Goals Unmet:  Not Applicable  Comments: Service time is from 10:30a to 12:05p    Dr. Rush Farmer is Medical Director for Pulmonary Rehab at [facility].

## 2017-02-25 NOTE — Telephone Encounter (Signed)
Pt called back and spoke to the after hours service states when he first uses the inhaler the pain goes away but then comes back

## 2017-02-25 NOTE — Telephone Encounter (Signed)
Pt returning call again for results and medication, please advise.Tony Mcmillan

## 2017-02-25 NOTE — Progress Notes (Signed)
Pharmacy Note  Subjective: Patient presents today to the [facility] to see Dr. Estanislado Pandy.  Patient is currently taking methotrexate 8 tablets weekly and folic acid 2 mg daily.  Patient seen by the pharmacist for counseling on injectable methotrexate.    Objective: CBC    Component Value Date/Time   WBC 5.3 11/06/2016 1336   RBC 4.94 11/06/2016 1336   HGB 14.0 11/06/2016 1336   HCT 40.9 11/06/2016 1336   PLT 286 11/06/2016 1336   MCV 82.8 11/06/2016 1336   MCH 28.3 11/06/2016 1336   MCHC 34.2 11/06/2016 1336   RDW 17.1 (H) 11/06/2016 1336   LYMPHSABS 1,007 11/06/2016 1336   MONOABS 583 11/06/2016 1336   EOSABS 106 11/06/2016 1336   BASOSABS 0 11/06/2016 1336   CMP     Component Value Date/Time   NA 137 11/06/2016 1336   K 4.1 11/06/2016 1336   CL 104 11/06/2016 1336   CO2 24 11/06/2016 1336   GLUCOSE 108 (H) 11/06/2016 1336   BUN 11 11/06/2016 1336   CREATININE 1.08 11/06/2016 1336   CALCIUM 9.1 11/06/2016 1336   PROT 6.9 11/06/2016 1336   ALBUMIN 3.8 11/06/2016 1336   AST 22 11/06/2016 1336   ALT 20 11/06/2016 1336   ALKPHOS 67 11/06/2016 1336   BILITOT 0.8 11/06/2016 1336   GFRNONAA 67 11/06/2016 1336   GFRAA 78 11/06/2016 1336    TB Gold: negative (08/14/16) Hepatitis panel: negative (08/14/16) HIV: negative (08/14/16)  Chest-xray:  02/21/17 "IMPRESSION: No acute cardiopulmonary disease."   Assessment/Plan:  Patient was initiated on methotrexate on 09/13/2016.  Patient was counseled extensively on the purpose, proper use, and adverse effects of methotrexate.  Today, patient is being switched to subcutaneous methotrexate.  Educated patient on how to use a vial and syringe and reviewed injection technique with patient.  Patient was able to demonstrate proper technique for injections using vial and syringe.  Provided patient on educational material regarding injection technique and storage of methotrexate.  Will plan on initiation of subcutaneous  methotrexate once patient's labs return.    Provided patient with Enbrel handout to consider in the future if patient continues to flare on subcutaneous methotrexate.     Elisabeth Most, Pharm.D., BCPS Clinical Pharmacist Pager: [phone] Phone: [phone] 02/25/2017 4:48 PM

## 2017-02-25 NOTE — Progress Notes (Signed)
Labs normal. OK to call in MTX SQ. Pt. To dc oral MTX

## 2017-02-25 NOTE — Patient Instructions (Signed)
Etanercept injection What is this medicine? ETANERCEPT (et a NER sept) is used for the treatment of rheumatoid arthritis in adults and children. The medicine is also used to treat psoriatic arthritis, ankylosing spondylitis, and psoriasis. This medicine may be used for other purposes; ask your health care provider or pharmacist if you have questions. COMMON BRAND NAME(S): Enbrel What should I tell my health care provider before I take this medicine? They need to know if you have any of these conditions: -blood disorders -cancer -congestive heart failure -diabetes -exposure to chickenpox -immune system problems -infection -multiple sclerosis -seizure disorder -tuberculosis, a positive skin test for tuberculosis or have recently been in close contact with someone who has tuberculosis -Wegener's granulomatosis -an unusual or allergic reaction to etanercept, latex, other medicines, foods, dyes, or preservatives -pregnant or trying to get pregnant -breast-feeding How should I use this medicine? The medicine is given by injection under the skin. You will be taught how to prepare and give this medicine. Use exactly as directed. Take your medicine at regular intervals. Do not take your medicine more often than directed. It is important that you put your used needles and syringes in a special sharps container. Do not put them in a trash can. If you do not have a sharps container, call your pharmacist or healthcare provider to get one. A special MedGuide will be given to you by the pharmacist with each prescription and refill. Be sure to read this information carefully each time. Talk to your pediatrician regarding the use of this medicine in children. While this drug may be prescribed for children as young as 4 years of age for selected conditions, precautions do apply. Overdosage: If you think you have taken too much of this medicine contact a poison control center or emergency room at once. NOTE:  This medicine is only for you. Do not share this medicine with others. What if I miss a dose? If you miss a dose, contact your health care professional to find out when you should take your next dose. Do not take double or extra doses without advice. What may interact with this medicine? Do not take this medicine with any of the following medications: -anakinra This medicine may also interact with the following medications: -cyclophosphamide -sulfasalazine -vaccines This list may not describe all possible interactions. Give your health care provider a list of all the medicines, herbs, non-prescription drugs, or dietary supplements you use. Also tell them if you smoke, drink alcohol, or use illegal drugs. Some items may interact with your medicine. What should I watch for while using this medicine? Tell your doctor or healthcare professional if your symptoms do not start to get better or if they get worse. You will be tested for tuberculosis (TB) before you start this medicine. If your doctor prescribes any medicine for TB, you should start taking the TB medicine before starting this medicine. Make sure to finish the full course of TB medicine. Call your doctor or health care professional for advice if you get a fever, chills or sore throat, or other symptoms of a cold or flu. Do not treat yourself. This drug decreases your body's ability to fight infections. Try to avoid being around people who are sick. What side effects may I notice from receiving this medicine? Side effects that you should report to your doctor or health care professional as soon as possible: -allergic reactions like skin rash, itching or hives, swelling of the face, lips, or tongue -changes in vision -fever, chills   or any other sign of infection -numbness or tingling in legs or other parts of the body -red, scaly patches or raised bumps on the skin -shortness of breath or difficulty breathing -swollen lymph nodes in the  neck, underarm, or groin areas -unexplained weight loss -unusual bleeding or bruising -unusual swelling or fluid retention in the legs -unusually weak or tired Side effects that usually do not require medical attention (report to your doctor or health care professional if they continue or are bothersome): -dizziness -headache -nausea -redness, itching, or swelling at the injection site -vomiting This list may not describe all possible side effects. Call your doctor for medical advice about side effects. You may report side effects to FDA at 1-800-FDA-1088. Where should I keep my medicine? Keep out of the reach of children. Store between 2 and 8 degrees C (36 and 46 degrees F). Do not freeze or shake. Protect from light. Throw away any unused medicine after the expiration date. You will be instructed on how to store this medicine. NOTE: This sheet is a summary. It may not cover all possible information. If you have questions about this medicine, talk to your doctor, pharmacist, or health care provider.  2018 Elsevier/Gold Standard (2012-06-15 15:33:36)  

## 2017-02-25 NOTE — Telephone Encounter (Signed)
Spoke with pt, who states his chest tightness subsides for 2-3h and then returns with bevespi. Pt is requesting refills for bevespi to be sent to [facility] on [address], if MR would like for pt to continue this medication .  MR please advise. Thanks.

## 2017-02-26 ENCOUNTER — Other Ambulatory Visit: Payer: Self-pay | Admitting: Pharmacist

## 2017-02-26 MED ORDER — "TUBERCULIN-ALLERGY SYRINGES 27G X 1/2"" 1 ML KIT": 1.0000 | PACK | 3 refills | Status: DC

## 2017-02-26 MED ORDER — GLYCOPYRROLATE-FORMOTEROL 9-4.8 MCG/ACT IN AERO: 2.0000 | INHALATION_SPRAY | Freq: Two times a day (BID) | RESPIRATORY_TRACT | 3 refills | Status: DC

## 2017-02-26 MED ORDER — METHOTREXATE SODIUM CHEMO INJECTION 50 MG/2ML: 20.0000 mg | INTRAMUSCULAR | 0 refills | Status: DC

## 2017-02-26 NOTE — Telephone Encounter (Signed)
Spoke with the pt and gave cxr results  He is asking for rx for Bevespi, states not having anymore chest tightness and feels med is helping  Rx was sent to pharm

## 2017-02-26 NOTE — Telephone Encounter (Signed)
Patient is waiting in the lobby, would like to speak to a nurse about medication refill and chest xray results.Tony Mcmillan

## 2017-02-26 NOTE — Progress Notes (Signed)
Enbrel was approved by patient's insurance.  We will not start Enbrel at this time.  Will consider Enbrel as an option at future visits based on his response to subcutaneous methotrexate.  Will need to consent patient on Enbrel prior to initiation.

## 2017-02-26 NOTE — Progress Notes (Signed)
Submitted a prior authorization through [place] and received an approval. Patient has been approved from 01/27/17-05/27/17.  Case number: [id number]

## 2017-02-26 NOTE — Progress Notes (Signed)
Pt notified of results

## 2017-02-27 ENCOUNTER — Telehealth: Payer: Self-pay | Admitting: Rheumatology

## 2017-02-27 ENCOUNTER — Encounter (HOSPITAL_COMMUNITY)
Admission: RE | Admit: 2017-02-27 | Discharge: 2017-02-27 | Disposition: A | Discharge status: Home / Self Care | Payer: Medicare Other | Source: Ambulatory Visit | Attending: Internal Medicine | Admitting: Internal Medicine

## 2017-02-27 VITALS — Wt 150.6 lb

## 2017-02-27 DIAGNOSIS — J439 Emphysema, unspecified: Secondary | ICD-10-CM

## 2017-02-27 DIAGNOSIS — J438 Other emphysema: Secondary | ICD-10-CM | POA: Diagnosis not present

## 2017-02-27 NOTE — Progress Notes (Signed)
Daily Session Note  Patient Details  Name: Tony Mcmillan MRN: [id number] Date of Birth: 24-Nov-1941 Referring Provider:   April Manson Pulmonary Rehab Walk Test from 01/16/2017 in [place]  Referring Provider  Dr. Chase Caller      Encounter Date: 02/27/2017  Check In:     Session Check In - 02/27/17 1106      Check-In   Location [facility]   Staff Present Su Hilt, MS, ACSM RCEP, Exercise Physiologist;Portia Rollene Rotunda, RN, Maxcine Ham, RN, BSN   Supervising physician immediately available to respond to emergencies [facility] immediately available   Physician(s) Dr. Tana Coast   Medication changes reported     No   Fall or balance concerns reported    No   Tobacco Cessation No Change   Warm-up and Cool-down Performed as group-led instruction   Resistance Training Performed Yes   VAD Patient? No     Pain Assessment   Currently in Pain? No/denies   Multiple Pain Sites No      Capillary Blood Glucose: No results found for this or any previous visit (from the past 24 hour(s)).      Exercise Prescription Changes - 02/27/17 1200      Response to Exercise   Blood Pressure (Admit) 110/56   Blood Pressure (Exercise) 130/80   Blood Pressure (Exit) 104/70   Heart Rate (Admit) 96 bpm   Heart Rate (Exercise) 111 bpm   Heart Rate (Exit) 100 bpm   Oxygen Saturation (Admit) 91 %   Oxygen Saturation (Exercise) 92 %   Oxygen Saturation (Exit) 93 %   Rating of Perceived Exertion (Exercise) 13   Perceived Dyspnea (Exercise) 2   Duration Progress to 45 minutes of aerobic exercise without signs/symptoms of physical distress   Intensity THRR unchanged     Progression   Progression Continue to progress workloads to maintain intensity without signs/symptoms of physical distress.     Resistance Training   Training Prescription Yes   Weight blue bands   Reps 10-15   Time 10 Minutes     Interval Training   Interval Training  No     NuStep   Level 4   Minutes 17   METs 2.1     Track   Laps 13   Minutes 17      History  Smoking Status  . Former Smoker  . Packs/day: 1.00  . Years: 17.00  . Types: Cigarettes  . Quit date: 12/23/1993  Smokeless Tobacco  . Never Used    Goals Met:  Exercise tolerated well No report of cardiac concerns or symptoms Strength training completed today  Goals Unmet:  Not Applicable  Comments: Service time is from 10:30a to 12:40p    Dr. Rush Farmer is Medical Director for Pulmonary Rehab at [facility].

## 2017-02-27 NOTE — Telephone Encounter (Signed)
Patient advised Prednisone was given for the pain and swelling in his wrist. Advised to take the prescription as prescribed until completed.

## 2017-02-27 NOTE — Telephone Encounter (Signed)
Patient called and stated that the pharmacy gave him prednisone.  He wants to make sure that he is suppose to be taking this with the MTX.  He is confused about what the prednisone is for.  Cb#[id number].  Thank you.

## 2017-03-04 ENCOUNTER — Encounter (HOSPITAL_COMMUNITY)
Admission: RE | Admit: 2017-03-04 | Discharge: 2017-03-04 | Disposition: A | Discharge status: Home / Self Care | Payer: Medicare Other | Source: Ambulatory Visit | Attending: Internal Medicine | Admitting: Internal Medicine

## 2017-03-04 VITALS — Wt 149.9 lb

## 2017-03-04 DIAGNOSIS — J439 Emphysema, unspecified: Secondary | ICD-10-CM

## 2017-03-04 DIAGNOSIS — J438 Other emphysema: Secondary | ICD-10-CM | POA: Diagnosis not present

## 2017-03-04 NOTE — Progress Notes (Signed)
Daily Session Note  Patient Details  Name: Tony Mcmillan MRN: [id number] Date of Birth: 10/16/1941 Referring Provider:   Flowsheet Row Pulmonary Rehab Walk Test from 01/16/2017 in [facility]  Referring Provider  Dr. Ramaswamy      Encounter Date: 03/04/2017  Check In:     Session Check In - 03/04/17 1225      Check-In   Location [facility]   Staff Present [name] diVincenzo, MS, ACSM RCEP, Exercise Physiologist;Maria Whitaker, RN, BSN;Olinty Richards, MS, ACSM CEP, Exercise Physiologist;Lisa Hughes, RN   Supervising physician immediately available to respond to emergencies [facility] immediately available   Physician(s) Dr Choi   Medication changes reported     No   Fall or balance concerns reported    No   Tobacco Cessation No Change   Warm-up and Cool-down Performed as group-led instruction   Resistance Training Performed Yes   VAD Patient? No     Pain Assessment   Currently in Pain? No/denies      Capillary Blood Glucose: No results found for this or any previous visit (from the past 24 hour(s)).      Exercise Prescription Changes - 03/04/17 1243      Response to Exercise   Blood Pressure (Admit) 112/62   Blood Pressure (Exercise) 142/62   Blood Pressure (Exit) 118/64   Heart Rate (Admit) 89 bpm   Heart Rate (Exercise) 115 bpm   Heart Rate (Exit) 89 bpm   Oxygen Saturation (Admit) 94 %   Oxygen Saturation (Exercise) 96 %   Oxygen Saturation (Exit) 94 %   Rating of Perceived Exertion (Exercise) 13   Perceived Dyspnea (Exercise) 3   Duration Progress to 45 minutes of aerobic exercise without signs/symptoms of physical distress   Intensity THRR unchanged     Progression   Progression Continue to progress workloads to maintain intensity without signs/symptoms of physical distress.     Resistance Training   Training Prescription Yes   Weight blue bands   Reps 10-15   Time 10 Minutes     Interval Training    Interval Training No     Bike   Level 0.8   Minutes 17     NuStep   Level 4   Minutes 17   METs 2.3     Track   Laps 16   Minutes 17      History  Smoking Status  . Former Smoker  . Packs/day: 1.00  . Years: 17.00  . Types: Cigarettes  . Quit date: 12/23/1993  Smokeless Tobacco  . Never Used    Goals Met:  Independence with exercise equipment Improved SOB with ADL's Exercise tolerated well No report of cardiac concerns or symptoms Strength training completed today  Goals Unmet:  Not Applicable  Comments: Service time is from 1030 to 1210   Dr. Wesam G. Yacoub is Medical Director for Pulmonary Rehab at [facility]. 

## 2017-03-04 NOTE — Progress Notes (Signed)
Pulmonary Individual Treatment Plan  Patient Details  Name: Tony Mcmillan MRN: [id number] Date of Birth: 01/24/1941 Referring Provider:   April Manson Pulmonary Rehab Walk Test from 01/16/2017 in [facility]  Referring Provider  Dr. Chase Caller      Initial Encounter Date:  Flowsheet Row Pulmonary Rehab Walk Test from 01/16/2017 in [facility]  Date  01/16/17  Referring Provider  Dr. Chase Caller      Visit Diagnosis: Pulmonary emphysema, unspecified emphysema type ([place])  Patient's Home Medications on Admission:   Current Outpatient Prescriptions:  .  dexlansoprazole (DEXILANT) 60 MG capsule, Take 1 capsule (60 mg total) by mouth daily. (Patient not taking: Reported on 02/25/2017), Disp: 10 capsule, Rfl: 0 .  fluticasone (FLONASE) 50 MCG/ACT nasal spray, Place 2 sprays into both nostrils daily., Disp: 16 g, Rfl: 1 .  folic acid (FOLVITE) 1 MG tablet, Take 2 mg by mouth daily., Disp: , Rfl:  .  Glycopyrrolate-Formoterol (BEVESPI AEROSPHERE) 9-4.8 MCG/ACT AERO, Inhale 2 puffs into the lungs 2 (two) times daily., Disp: 1 Inhaler, Rfl: 3 .  methotrexate 50 MG/2ML injection, Inject 0.8 mLs (20 mg total) into the skin once a week., Disp: 4 mL, Rfl: 0 .  predniSONE (DELTASONE) 5 MG tablet, Take 1 tablet (5 mg total) by mouth daily with breakfast. 4 tab q am x4days, 3 tab q am x4d, 2 tab q amx4 days, 1 tab q am x4d ,1/2 tab qam x4d then  d/c, Disp: 42 tablet, Rfl: 0 .  Tiotropium Bromide Monohydrate (SPIRIVA RESPIMAT) 2.5 MCG/ACT AERS, Inhale 2 puffs into the lungs daily., Disp: 4 g, Rfl: 4 .  Tuberculin-Allergy Syringes 27G X 1/2" 1 ML KIT, Inject 1 Syringe into the skin once a week. To be used with weekly methotrexate injections., Disp: 12 each, Rfl: 3  Past Medical History: Past Medical History:  Diagnosis Date  . Anemia   . Atypical chest pain    a. 09/2016 MV: EF 63%, normal perfusion.  . Blood transfusion without reported  diagnosis   . Diastolic dysfunction    a. 2015 Echo: EF >55%;  b. 09/2016 Echo: EF 55-60%, no rwma, Gr1 DD, triv AI/MR, mild TR, PASP 29mHg.  . [place]  . GERD (gastroesophageal reflux disease)   . [place]oid [place]     Tobacco Use: History  Smoking Status  . Former Smoker  . Packs/day: 1.00  . Years: 17.00  . Types: Cigarettes  . Quit date: 12/23/1993  Smokeless Tobacco  . Never Used    Labs: Recent Review Flowsheet Data    Labs for ITP Cardiac and Pulmonary Rehab Latest Ref Rng & Units 03/19/2016   Cholestrol 125 - 200 mg/dL 206(H)   LDLCALC <130 mg/dL 115   HDL >=40 mg/dL 49   Trlycerides <150 mg/dL 210(H)      Capillary Blood Glucose: No results found for: GLUCAP   ADL UCSD:   Pulmonary Function Assessment:     Pulmonary Function Assessment - 01/13/17 1248      Breath   Bilateral Breath Sounds Clear   Shortness of Breath No      Exercise Target Goals:    Exercise Program Goal: Individual exercise prescription set with THRR, safety & activity barriers. Participant demonstrates ability to understand and report RPE using BORG scale, to self-measure pulse accurately, and to acknowledge the importance of the exercise prescription.  Exercise Prescription Goal: Starting with aerobic activity 30 plus minutes a day, 3  days per week for initial exercise prescription. Provide home exercise prescription and guidelines that participant acknowledges understanding prior to discharge.  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 HR 127 bpm     Max Ex. BP 138/84       Interval HR   Baseline HR 94     1 Minute HR 112     2 Minute HR 126     3 Minute HR 128     4 Minute HR 118     5 Minute HR 124     6 Minute HR 127     2 Minute Post HR 92     Interval Heart Rate? Yes       Interval Oxygen   Interval Oxygen? Yes     Baseline Oxygen Saturation % 94 %     Baseline Liters of Oxygen 0 L     1 Minute Oxygen Saturation % 95 %     1 Minute Liters of Oxygen 0 L     2 Minute Oxygen Saturation % 94 %     2 Minute Liters of Oxygen 0 L     3 Minute Oxygen Saturation % 95 %     3 Minute Liters of Oxygen 0 L     4 Minute Oxygen Saturation % 95 %     4 Minute Liters of Oxygen 0 L     5 Minute Oxygen Saturation % 96 %     5 Minute Liters of Oxygen 0 L     6 Minute Oxygen Saturation % 96 %     6 Minute Liters of Oxygen 0 L     2 Minute Post Oxygen Saturation % 97 %     2 Minute Post Liters of Oxygen 0 L        Oxygen Initial Assessment:   Oxygen Re-Evaluation:     Oxygen Re-Evaluation    Row Name 03/03/17 1104             Program Oxygen Prescription   Program Oxygen Prescription None         Home Oxygen   Home Oxygen Device None          Oxygen Discharge (Final Oxygen Re-Evaluation):     Oxygen Re-Evaluation - 03/03/17 1104      Program Oxygen Prescription   Program Oxygen Prescription None     Home Oxygen   Home Oxygen Device None      Initial Exercise Prescription:     Initial Exercise Prescription - 01/16/17 1600  Date of Initial Exercise RX and Referring Provider   Date 01/16/17   Referring Provider Dr. Chase Caller     Oxygen   Oxygen --     Bike   Level 0.5   Minutes 17     NuStep   Level 2   Minutes 17   METs 1.5     Track   Laps 5   Minutes 17     Prescription Details   Frequency (times per week) 2   Duration  Progress to 45 minutes of aerobic exercise without signs/symptoms of physical distress     Intensity   THRR 40-80% of Max Heartrate 58-117   Ratings of Perceived Exertion 11-13   Perceived Dyspnea 0-4     Progression   Progression Continue progressive overload as per policy without signs/symptoms or physical distress.     Resistance Training   Training Prescription Yes   Weight blue bands   Reps 10-12      Perform Capillary Blood Glucose checks as needed.  Exercise Prescription Changes:     Exercise Prescription Changes    Row Name 01/23/17 1200 01/28/17 1200 01/30/17 1300 02/04/17 1300 02/11/17 1200     Response to Exercise   Blood Pressure (Admit) 104/60 114/60 108/60 118/70 108/76   Blood Pressure (Exercise) 106/72 150/74 150/80 138/84 156/80   Blood Pressure (Exit) 120/60 114/70 122/70 128/80 102/64   Heart Rate (Admit) 83 bpm 79 bpm 85 bpm 90 bpm 98 bpm   Heart Rate (Exercise) 94 bpm 109 bpm 103 bpm 113 bpm 106 bpm   Heart Rate (Exit) 78 bpm 82 bpm 90 bpm 98 bpm 95 bpm   Oxygen Saturation (Admit) 95 % 92 % 94 % 94 % 98 %   Oxygen Saturation (Exercise) 96 % 92 % 93 % 94 % 94 %   Oxygen Saturation (Exit) 96 % 95 % 94 % 94 % 95 %   Rating of Perceived Exertion (Exercise) 11 13 12 13 13    Perceived Dyspnea (Exercise) 1 1 1 1 2    Duration Progress to 45 minutes of aerobic exercise without signs/symptoms of physical distress Progress to 45 minutes of aerobic exercise without signs/symptoms of physical distress Progress to 45 minutes of aerobic exercise without signs/symptoms of physical distress Progress to 45 minutes of aerobic exercise without signs/symptoms of physical distress Progress to 45 minutes of aerobic exercise without signs/symptoms of physical distress   Intensity -  40-80% HRR -  40-80% HRR THRR unchanged THRR unchanged THRR unchanged     Progression   Progression Continue to progress workloads to maintain intensity without signs/symptoms of physical distress.  Continue to progress workloads to maintain intensity without signs/symptoms of physical distress. Continue to progress workloads to maintain intensity without signs/symptoms of physical distress. Continue to progress workloads to maintain intensity without signs/symptoms of physical distress. Continue to progress workloads to maintain intensity without signs/symptoms of physical distress.     Resistance Training   Training Prescription Yes Yes Yes Yes Yes   Weight blue bands blue bands blue bands blue bands blue bands   Reps 10-12  10 minutes of strength training 10-12  10 minutes of strength training 10-12  10 minutes of strength training 10-12  10 minutes of strength training 10-12  10 minutes of strength training     Interval Training   Interval Training No No No No No     Bike   Level 0.5 0.5  - 0.5 0.5   Minutes 17 17  -  17 17     NuStep   Level  - 2 3 4 4    Minutes  - 17 17 17 17    METs  - 1.9 2.4 2.5 2.4     Track   Laps 8 8 11 14 13    Minutes 17 17 17 17 17      Exercise Review   Progression  -  - Yes Yes  -   Row Name 02/13/17 1200 02/18/17 1200 02/20/17 1231 02/25/17 1200 02/27/17 1200     Response to Exercise   Blood Pressure (Admit) 124/74 130/66 118/80 114/76 110/56   Blood Pressure (Exercise) 140/80 140/72 166/80 126/70 130/80   Blood Pressure (Exit) 100/64 108/70 108/60 100/60 104/70   Heart Rate (Admit) 86 bpm 96 bpm 94 bpm 79 bpm 96 bpm   Heart Rate (Exercise) 114 bpm 124 bpm 134 bpm 124 bpm 111 bpm   Heart Rate (Exit) 90 bpm 74 bpm 86 bpm 86 bpm 100 bpm   Oxygen Saturation (Admit) 98 % 93 % 93 % 94 % 91 %   Oxygen Saturation (Exercise) 91 % 93 % 93 % 92 % 92 %   Oxygen Saturation (Exit) 94 % 99 % 96 % 94 % 93 %   Rating of Perceived Exertion (Exercise) 12 13 13 13 13    Perceived Dyspnea (Exercise) 3 2 2 2 2    Duration Progress to 45 minutes of aerobic exercise without signs/symptoms of physical distress Progress to 45 minutes of aerobic exercise without  signs/symptoms of physical distress Progress to 45 minutes of aerobic exercise without signs/symptoms of physical distress Progress to 45 minutes of aerobic exercise without signs/symptoms of physical distress Progress to 45 minutes of aerobic exercise without signs/symptoms of physical distress   Intensity THRR unchanged THRR unchanged THRR unchanged THRR unchanged THRR unchanged     Progression   Progression Continue to progress workloads to maintain intensity without signs/symptoms of physical distress. Continue to progress workloads to maintain intensity without signs/symptoms of physical distress. Continue to progress workloads to maintain intensity without signs/symptoms of physical distress. Continue to progress workloads to maintain intensity without signs/symptoms of physical distress. Continue to progress workloads to maintain intensity without signs/symptoms of physical distress.     Resistance Training   Training Prescription Yes Yes Yes Yes Yes   Weight blue bands blue bands blue bands blue bands blue bands   Reps 10-12  10 minutes of strength training 10-15 10-15 10-15 10-15   Time  - 10 Minutes 10 Minutes 10 Minutes 10 Minutes     Interval Training   Interval Training No No No No No     Bike   Level  - 0.5 0.8 0.8  -   Minutes  - 17 17 17   -     NuStep   Level 4 4  - 4 4   Minutes 17 17  - 17 17   METs 2.4 2.4  -  - 2.1     Track   Laps 14 13 14 15 13    Minutes 17 17 17 17 17       Exercise Comments:     Exercise Comments    Row Name 02/03/17 1253           Exercise Comments Patient has only attended three sessions. Will cont. to monitor and progress.           Exercise Goals and Review:   Exercise Goals Re-Evaluation :     Exercise Goals Re-Evaluation  Tony Mcmillan Name 03/03/17 1610             Exercise Goal Re-Evaluation   Exercise Goals Review Increase Physical Activity;Increase Strenth and Stamina       Comments Patient is managing well with  workload increases and motivating himself. Patient is walking 14 laps (263fperlap) in 15 minutes. Will cont. to monitor and progress as appropriate.        Expected Outcomes Through the exercise here at rehab the patient with increase physical capacity, strength, and stamina.          Discharge Exercise Prescription (Final Exercise Prescription Changes):     Exercise Prescription Changes - 02/27/17 1200      Response to Exercise   Blood Pressure (Admit) 110/56   Blood Pressure (Exercise) 130/80   Blood Pressure (Exit) 104/70   Heart Rate (Admit) 96 bpm   Heart Rate (Exercise) 111 bpm   Heart Rate (Exit) 100 bpm   Oxygen Saturation (Admit) 91 %   Oxygen Saturation (Exercise) 92 %   Oxygen Saturation (Exit) 93 %   Rating of Perceived Exertion (Exercise) 13   Perceived Dyspnea (Exercise) 2   Duration Progress to 45 minutes of aerobic exercise without signs/symptoms of physical distress   Intensity THRR unchanged     Progression   Progression Continue to progress workloads to maintain intensity without signs/symptoms of physical distress.     Resistance Training   Training Prescription Yes   Weight blue bands   Reps 10-15   Time 10 Minutes     Interval Training   Interval Training No     NuStep   Level 4   Minutes 17   METs 2.1     Track   Laps 13   Minutes 17      Nutrition:  Target Goals: Understanding of nutrition guidelines, daily intake of sodium <15047m cholesterol <20070mcalories 30% from fat and 7% or less from saturated fats, daily to have 5 or more servings of fruits and vegetables.  Biometrics:     Pre Biometrics - 01/13/17 1251      Pre Biometrics   Grip Strength 22 kg       Nutrition Therapy Plan and Nutrition Goals:   Nutrition Discharge: Rate Your Plate Scores:   Nutrition Goals Re-Evaluation:   Nutrition Goals Discharge (Final Nutrition Goals Re-Evaluation):   Psychosocial: Target Goals: Acknowledge presence or absence of  significant depression and/or stress, maximize coping skills, provide positive support system. Participant is able to verbalize types and ability to use techniques and skills needed for reducing stress and depression.  Initial Review & Psychosocial Screening:     Initial Psych Review & Screening - 01/13/17 1254      Initial Review   Current issues with --  none identified     Family Dynamics   Good Support System? Yes     Barriers   Psychosocial barriers to participate in program There are no identifiable barriers or psychosocial needs.     Screening Interventions   Interventions Encouraged to exercise      Quality of Life Scores:   PHQ-9: Recent Review Flowsheet Data    [facility]   Decreased Interest 0 0   Down, Depressed, Hopeless 0 0   PHQ - 2 Score 0 0     Interpretation of Total Score  Total Score Depression Severity:  1-4 = Minimal depression, 5-9 = Mild depression, 10-14 = Moderate depression, 15-19 = Moderately severe depression,  20-27 = Severe depression   Psychosocial Evaluation and Intervention:     Psychosocial Evaluation - 01/13/17 1254      Psychosocial Evaluation & Interventions   Interventions Encouraged to exercise with the program and follow exercise prescription   Continue Psychosocial Services  No     Discharge Psychosocial Assessment & Intervention   Discharge Continue support measures as needed      Psychosocial Re-Evaluation:     Psychosocial Re-Evaluation    Tony Mcmillan Name 02/03/17 1647 03/03/17 1106           Psychosocial Re-Evaluation   Comments No barriers identified since admission No barriers identified since admission      Interventions Encouraged to attend Pulmonary Rehabilitation for the exercise Encouraged to attend Pulmonary Rehabilitation for the exercise      Continue Psychosocial Services   - No Follow up required         Psychosocial Discharge (Final Psychosocial Re-Evaluation):      Psychosocial Re-Evaluation - 03/03/17 1106      Psychosocial Re-Evaluation   Comments No barriers identified since admission   Interventions Encouraged to attend Pulmonary Rehabilitation for the exercise   Continue Psychosocial Services  No Follow up required      Education: Education Goals: Education classes will be provided on a weekly basis, covering required topics. Participant will state understanding/return demonstration of topics presented.  Learning Barriers/Preferences:     Learning Barriers/Preferences - 01/13/17 1248      Learning Barriers/Preferences   Learning Barriers None   Learning Preferences Video;Written Material;Verbal Instruction;Skilled Demonstration;Pictoral;Individual Instruction;Group Instruction;Audio      Education Topics: Risk Factor Reduction:  -Group instruction that is supported by a PowerPoint presentation. Instructor discusses the definition of a risk factor, different risk factors for pulmonary disease, and how the heart and lungs work together.     Nutrition for Pulmonary Patient:  -Group instruction provided by PowerPoint slides, verbal discussion, and written materials to support subject matter. The instructor gives an explanation and review of healthy diet recommendations, which includes a discussion on weight management, recommendations for fruit and vegetable consumption, as well as protein, fluid, caffeine, fiber, sodium, sugar, and alcohol. Tips for eating when patients are short of breath are discussed.   Pursed Lip Breathing:  -Group instruction that is supported by demonstration and informational handouts. Instructor discusses the benefits of pursed lip and diaphragmatic breathing and detailed demonstration on how to preform both.     Oxygen Safety:  -Group instruction provided by PowerPoint, verbal discussion, and written material to support subject matter. There is an overview of "What is Oxygen" and "Why do we need it".   Instructor also reviews how to create a safe environment for oxygen use, the importance of using oxygen as prescribed, and the risks of noncompliance. There is a brief discussion on traveling with oxygen and resources the patient may utilize.   Oxygen Equipment:  -Group instruction provided by [facility] Staff utilizing handouts, written materials, and equipment demonstrations.   Signs and Symptoms:  -Group instruction provided by written material and verbal discussion to support subject matter. Warning signs and symptoms of infection, stroke, and heart attack are reviewed and when to call the physician/911 reinforced. Tips for preventing the spread of infection discussed. Flowsheet Row PULMONARY REHAB OTHER RESPIRATORY from 02/27/2017 in [place]  Date  01/23/17  Educator  RN  Instruction Review Code  2- meets goals/outcomes      Advanced Directives:  -Group instruction provided by  verbal instruction and written material to support subject matter. Instructor reviews Advanced Directive laws and proper instruction for filling out document.   Pulmonary Video:  -Group video education that reviews the importance of medication and oxygen compliance, exercise, good nutrition, pulmonary hygiene, and pursed lip and diaphragmatic breathing for the pulmonary patient. Flowsheet Row PULMONARY REHAB OTHER RESPIRATORY from 02/27/2017 in [place]  Date  02/13/17  Educator  staff  Instruction Review Code  2- meets goals/outcomes      Exercise for the Pulmonary Patient:  -Group instruction that is supported by a PowerPoint presentation. Instructor discusses benefits of exercise, core components of exercise, frequency, duration, and intensity of an exercise routine, importance of utilizing pulse oximetry during exercise, safety while exercising, and options of places to exercise outside of rehab.     Pulmonary Medications:  -Verbally  interactive group education provided by instructor with focus on inhaled medications and proper administration.   Anatomy and Physiology of the Respiratory System and Intimacy:  -Group instruction provided by PowerPoint, verbal discussion, and written material to support subject matter. Instructor reviews respiratory cycle and anatomical components of the respiratory system and their functions. Instructor also reviews differences in obstructive and restrictive respiratory diseases with examples of each. Intimacy, Sex, and Sexuality differences are reviewed with a discussion on how relationships can change when diagnosed with pulmonary disease. Common sexual concerns are reviewed. Flowsheet Row PULMONARY REHAB OTHER RESPIRATORY from 02/27/2017 in [place]  Date  02/27/17  Educator  [place]  Instruction Review Code  2- meets goals/outcomes      Knowledge Questionnaire Score:   Core Components/Risk Factors/Patient Goals at Admission:     Personal Goals and Risk Factors at Admission - 01/13/17 1253      Core Components/Risk Factors/Patient Goals on Admission   Sedentary Yes   Increase Strength and Stamina Yes      Core Components/Risk Factors/Patient Goals Review:      Goals and Risk Factor Review    Row Name 01/13/17 1253 02/03/17 1650 03/03/17 1105         Core Components/Risk Factors/Patient Goals Review   Personal Goals Review Sedentary;Increase Strength and Stamina Sedentary;Increase Strength and Stamina Develop more efficient breathing techniques such as purse lipped breathing and diaphragmatic breathing and practicing self-pacing with activity.;Improve shortness of breath with ADL's     Review  - see comments section on ITP see comments section on ITP     Expected Outcomes  - see Admission expected outcomes see Admission expected outcomes        Core Components/Risk Factors/Patient Goals at Discharge (Final Review):      Goals and Risk  Factor Review - 03/03/17 1105      Core Components/Risk Factors/Patient Goals Review   Personal Goals Review Develop more efficient breathing techniques such as purse lipped breathing and diaphragmatic breathing and practicing self-pacing with activity.;Improve shortness of breath with ADL's   Review see comments section on ITP   Expected Outcomes see Admission expected outcomes      ITP Comments:   Comments: ITP REVIEW Pt is making expected progress toward pulmonary rehab goals after completing 10 sessions. He is able to tolerate an increase in workloads which has helped increase his stamina and strength. He no longer has to be reminded to purse lip breath during exertion. Recommend continued exercise, life style modification, education, and utilization of breathing techniques to increase stamina and strength and decrease shortness of breath with exertion.

## 2017-03-06 ENCOUNTER — Ambulatory Visit: Payer: Medicare Other | Admitting: Rheumatology

## 2017-03-06 ENCOUNTER — Encounter (HOSPITAL_COMMUNITY)
Admission: RE | Admit: 2017-03-06 | Discharge: 2017-03-06 | Disposition: A | Discharge status: Home / Self Care | Payer: Medicare Other | Source: Ambulatory Visit | Attending: Internal Medicine | Admitting: Internal Medicine

## 2017-03-06 ENCOUNTER — Telehealth: Payer: Self-pay | Admitting: Internal Medicine

## 2017-03-06 VITALS — Wt 149.5 lb

## 2017-03-06 DIAGNOSIS — J438 Other emphysema: Secondary | ICD-10-CM | POA: Diagnosis not present

## 2017-03-06 DIAGNOSIS — J439 Emphysema, unspecified: Secondary | ICD-10-CM

## 2017-03-06 NOTE — Telephone Encounter (Signed)
Spoke with the pt He states going out of town for a funeral for 2 nights and wonders if ok to leave off o2 for those nights  He uses 1 lpm with sleep only  He checked with his DME and they do not supply portable o2 for travel  Please advise if you think this will be okay, thanks

## 2017-03-06 NOTE — Telephone Encounter (Signed)
Will be fine to go withuiot nithg o2 for few days  Dr. Brand Males, M.D., [facility].C.P Pulmonary and Critical Care Medicine Staff Physician [place] [facility] Pager: [phone], If no answer or between  15:00h - 7:00h: call [phone]  03/06/2017 5:05 PM

## 2017-03-06 NOTE — Progress Notes (Signed)
Daily Session Note  Patient Details  Name: [name] MRN: [id number] Date of Birth: 03/24/1941 Referring Provider:     Pulmonary Rehab Walk Test from 01/16/2017 in [place]  Referring Provider  Dr. Chase Caller      Encounter Date: 03/06/2017  Check In:     Session Check In - 03/06/17 1319      Check-In   Location [facility]   Staff Present  , MS, ACSM RCEP, Exercise Physiologist      Capillary Blood Glucose: No results found for this or any previous visit (from the past 24 hour(s)).      Exercise Prescription Changes - 03/06/17 1300      Response to Exercise   Blood Pressure (Admit) 128/62   Blood Pressure (Exercise) 134/76   Blood Pressure (Exit) 120/76   Heart Rate (Admit) 79 bpm   Heart Rate (Exercise) 128 bpm   Heart Rate (Exit) 90 bpm   Oxygen Saturation (Admit) 92 %   Oxygen Saturation (Exercise) 92 %   Oxygen Saturation (Exit) 93 %   Rating of Perceived Exertion (Exercise) 13   Perceived Dyspnea (Exercise) 1   Duration Progress to 45 minutes of aerobic exercise without signs/symptoms of physical distress   Intensity THRR unchanged     Progression   Progression Continue to progress workloads to maintain intensity without signs/symptoms of physical distress.     Resistance Training   Training Prescription Yes   Weight blue bands   Reps 10-15   Time 10 Minutes     Interval Training   Interval Training No     Bike   Level 0.8   Minutes 17     NuStep   Level 4   Minutes 17   METs 2.4      History  Smoking Status  . Former Smoker  . Packs/day: 1.00  . Years: 17.00  . Types: Cigarettes  . Quit date: 12/23/1993  Smokeless Tobacco  . Never Used    Goals Met:  Exercise tolerated well No report of cardiac concerns or symptoms Strength training completed today  Goals Unmet:  Not Applicable  Comments: Service time is from 10:30A to 12:45P    Dr. Rush Farmer is  Medical Director for Pulmonary Rehab at [facility].

## 2017-03-06 NOTE — Telephone Encounter (Signed)
lmtcb x1 for pt. 

## 2017-03-06 NOTE — Telephone Encounter (Signed)
Spoke with pt,aware of recs.  Nothing further needed.  

## 2017-03-06 NOTE — Telephone Encounter (Signed)
Patient returned call, CB is [phone].

## 2017-03-10 ENCOUNTER — Ambulatory Visit: Payer: Medicare Other | Admitting: Rheumatology

## 2017-03-11 ENCOUNTER — Encounter (HOSPITAL_COMMUNITY)
Admission: RE | Admit: 2017-03-11 | Discharge: 2017-03-11 | Disposition: A | Discharge status: Home / Self Care | Payer: Medicare Other | Source: Ambulatory Visit | Attending: Internal Medicine | Admitting: Internal Medicine

## 2017-03-11 VITALS — Wt 150.1 lb

## 2017-03-11 DIAGNOSIS — J438 Other emphysema: Secondary | ICD-10-CM | POA: Diagnosis not present

## 2017-03-11 DIAGNOSIS — J439 Emphysema, unspecified: Secondary | ICD-10-CM

## 2017-03-11 NOTE — Progress Notes (Signed)
Daily Session Note  Patient Details  Name: Eian Vandervelden MRN: [id number] Date of Birth: Apr 22, 1941 Referring Provider:     Pulmonary Rehab Walk Test from 01/16/2017 in [place]  Referring Provider  Dr. Chase Caller      Encounter Date: 03/11/2017  Check In:   Capillary Blood Glucose: No results found for this or any previous visit (from the past 24 hour(s)).      Exercise Prescription Changes - 03/11/17 1200      Response to Exercise   Blood Pressure (Admit) 114/68   Blood Pressure (Exercise) 144/80   Blood Pressure (Exit) 96/60   Heart Rate (Admit) 95 bpm   Heart Rate (Exercise) 125 bpm   Heart Rate (Exit) 101 bpm   Oxygen Saturation (Admit) 94 %   Oxygen Saturation (Exercise) 94 %   Oxygen Saturation (Exit) 96 %   Rating of Perceived Exertion (Exercise) 13   Perceived Dyspnea (Exercise) 3   Duration Progress to 45 minutes of aerobic exercise without signs/symptoms of physical distress   Intensity THRR unchanged     Progression   Progression Continue to progress workloads to maintain intensity without signs/symptoms of physical distress.     Resistance Training   Training Prescription Yes   Weight blue bands   Reps 10-15   Time 10 Minutes     Interval Training   Interval Training No     Bike   Level 0.8   Minutes 17     NuStep   Level 4   Minutes 17   METs 2.3     Track   Laps 16   Minutes 17      History  Smoking Status  . Former Smoker  . Packs/day: 1.00  . Years: 17.00  . Types: Cigarettes  . Quit date: 12/23/1993  Smokeless Tobacco  . Never Used    Goals Met:  Exercise tolerated well No report of cardiac concerns or symptoms Strength training completed today  Goals Unmet:  Not Applicable  Comments: Service time is from 1030 to 1200    Dr. Rush Farmer is Medical Director for Pulmonary Rehab at [facility].

## 2017-03-13 ENCOUNTER — Encounter (HOSPITAL_COMMUNITY)
Admission: RE | Admit: 2017-03-13 | Discharge: 2017-03-13 | Disposition: A | Discharge status: Home / Self Care | Payer: Medicare Other | Source: Ambulatory Visit | Attending: Internal Medicine | Admitting: Internal Medicine

## 2017-03-13 VITALS — Wt 150.6 lb

## 2017-03-13 DIAGNOSIS — J439 Emphysema, unspecified: Secondary | ICD-10-CM

## 2017-03-13 DIAGNOSIS — J438 Other emphysema: Secondary | ICD-10-CM | POA: Diagnosis not present

## 2017-03-13 NOTE — Progress Notes (Signed)
Daily Session Note  Patient Details  Name: Case Vassell MRN: [id number] Date of Birth: 05/11/1941 Referring Provider:     Pulmonary Rehab Walk Test from 01/16/2017 in [place]  Referring Provider  Dr. Chase Caller      Encounter Date: 03/13/2017  Check In:     Session Check In - 03/13/17 1030      Check-In   Location [facility]   Staff Present Su Hilt, MS, ACSM RCEP, Exercise Physiologist;Lisa Ysidro Evert, RN; Rollene Rotunda, RN, BSN   Supervising physician immediately available to respond to emergencies [facility] immediately available   Physician(s) Dr. Allyson Sabal   Medication changes reported     No   Fall or balance concerns reported    No   Tobacco Cessation No Change   Warm-up and Cool-down Performed as group-led instruction   Resistance Training Performed Yes   VAD Patient? No     Pain Assessment   Currently in Pain? No/denies   Multiple Pain Sites No      Capillary Blood Glucose: No results found for this or any previous visit (from the past 24 hour(s)).      Exercise Prescription Changes - 03/13/17 1235      Response to Exercise   Blood Pressure (Admit) 114/52   Blood Pressure (Exercise) 130/80   Blood Pressure (Exit) 102/58   Heart Rate (Admit) 91 bpm   Heart Rate (Exercise) 106 bpm   Heart Rate (Exit) 75 bpm   Oxygen Saturation (Admit) 94 %   Oxygen Saturation (Exercise) 92 %   Oxygen Saturation (Exit) 97 %   Rating of Perceived Exertion (Exercise) 13   Perceived Dyspnea (Exercise) 3   Duration Progress to 45 minutes of aerobic exercise without signs/symptoms of physical distress   Intensity THRR unchanged     Progression   Progression Continue to progress workloads to maintain intensity without signs/symptoms of physical distress.     Resistance Training   Training Prescription Yes   Weight blue bands   Reps 10-15   Time 10 Minutes     Interval Training   Interval Training No     NuStep    Level 4   Minutes 17   METs 2.3     Track   Laps 14   Minutes 17      History  Smoking Status  . Former Smoker  . Packs/day: 1.00  . Years: 17.00  . Types: Cigarettes  . Quit date: 12/23/1993  Smokeless Tobacco  . Never Used    Goals Met:  Improved SOB with ADL's Exercise tolerated well No report of cardiac concerns or symptoms Strength training completed today  Goals Unmet:  Not Applicable  Comments: Service time is from 1030 to 1220   Dr. Rush Farmer is Medical Director for Pulmonary Rehab at [facility].

## 2017-03-18 ENCOUNTER — Encounter (HOSPITAL_COMMUNITY)
Admission: RE | Admit: 2017-03-18 | Discharge: 2017-03-18 | Disposition: A | Discharge status: Home / Self Care | Payer: Medicare Other | Source: Ambulatory Visit | Attending: Internal Medicine | Admitting: Internal Medicine

## 2017-03-18 VITALS — Wt 151.0 lb

## 2017-03-18 DIAGNOSIS — J438 Other emphysema: Secondary | ICD-10-CM | POA: Diagnosis not present

## 2017-03-18 DIAGNOSIS — J439 Emphysema, unspecified: Secondary | ICD-10-CM

## 2017-03-18 NOTE — Progress Notes (Signed)
Tony Mcmillan 76 y.o. male Nutrition Note Spoke with pt. Pt is overweight and pt wants to maintain his current wt.  Pt eats 2 meals a day; most prepared at home.  There are some ways the pt can make his eating habits healthier. Pt's Rate Your Plate completed with pt and results reviewed. Pt avoids some salty food; uses canned/ convenience food.  Pt adds salt to food.  The role of sodium in lung disease reviewed with pt. Pt expressed understanding of the information reviewed via feedback method.    No results found for: HGBA1C  Nutrition Diagnosis ? Food-and nutrition-related knowledge deficit related to lack of exposure to information as related to diagnosis of pulmonary disease Nutrition Intervention ? Pt's individual nutrition plan and goals reviewed with pt. ? Benefits of adopting healthy eating habits discussed when pt's Rate Your Plate reviewed. ? Pt to add calcium supplement with vitamin D 600 mg BID with meals due to chronic prednisone use.  ? Pt to attend the Nutrition and Lung Disease class ? Continual client-centered nutrition education by RD, as part of interdisciplinary care. Goal(s) 1. Pt to identify and limit food sources of sodium. 2. Identify food quantities necessary to achieve wt loss of  -2# per week to a goal wt loss of 2.7-10.9 kg (6-24 lb) at graduation from pulmonary rehab. 3. Describe the benefit of including fruits, vegetables, whole grains, low-fat dairy products, and lean meats in a healthy meal plan. Monitor and Evaluate progress toward nutrition goal with team.   Derek Mound, M.Ed, RD, LDN, CDE 03/18/2017 12:14 PM

## 2017-03-18 NOTE — Progress Notes (Signed)
Daily Session Note  Patient Details  Name: Tony Mcmillan MRN: [id number] Date of Birth: 06-30-1941 Referring Provider:     Pulmonary Rehab Walk Test from 01/16/2017 in [place]  Referring Provider  Dr. Chase Caller      Encounter Date: 03/18/2017  Check In:     Session Check In - 03/18/17 1030      Check-In   Location [facility]   Staff Present Rosebud Poles, RN, BSN; , MS, ACSM RCEP, Exercise Physiologist;Annedrea Rosezella [place], RN, MHA;Portia Rollene Rotunda, RN, BSN   Supervising physician immediately available to respond to emergencies [facility] immediately available   Physician(s) Dr. Tana Coast   Medication changes reported     No   Fall or balance concerns reported    No   Tobacco Cessation No Change   Warm-up and Cool-down Performed as group-led instruction   Resistance Training Performed Yes   VAD Patient? No     Pain Assessment   Currently in Pain? No/denies   Multiple Pain Sites No      Capillary Blood Glucose: No results found for this or any previous visit (from the past 24 hour(s)).      Exercise Prescription Changes - 03/18/17 1200      Response to Exercise   Blood Pressure (Admit) 108/62   Blood Pressure (Exercise) 154/81   Blood Pressure (Exit) 104/64   Heart Rate (Admit) 92 bpm   Heart Rate (Exercise) 109 bpm   Heart Rate (Exit) 83 bpm   Oxygen Saturation (Admit) 94 %   Oxygen Saturation (Exercise) 93 %   Oxygen Saturation (Exit) 96 %   Rating of Perceived Exertion (Exercise) 14   Perceived Dyspnea (Exercise) 1   Duration Progress to 45 minutes of aerobic exercise without signs/symptoms of physical distress   Intensity THRR unchanged     Progression   Progression Continue to progress workloads to maintain intensity without signs/symptoms of physical distress.     Resistance Training   Training Prescription Yes   Weight blue bands   Reps 10-15   Time 10 Minutes     Interval Training   Interval Training No     Bike   Level 0.8   Minutes 17     NuStep   Level 5   Minutes 17   METs 2.2     Exercise Review   Progression Yes      History  Smoking Status  . Former Smoker  . Packs/day: 1.00  . Years: 17.00  . Types: Cigarettes  . Quit date: 12/23/1993  Smokeless Tobacco  . Never Used    Goals Met:  Exercise tolerated well No report of cardiac concerns or symptoms Strength training completed today  Goals Unmet:  Not Applicable  Comments: Service time is from 10:30a to 12:05p    Dr. Rush Farmer is Medical Director for Pulmonary Rehab at [facility].

## 2017-03-19 NOTE — Progress Notes (Signed)
Office Visit Note  Patient: Tony Mcmillan             Date of Birth: 1941-04-19           MRN: [id number]             PCP: Harland Dingwall, NP-C Referring: Girtha Rm, NP-C Visit Date: 03/26/2017 Occupation: _0 @    Subjective:  Pain hands   History of Present Illness: Tony Mcmillan is a 76 y.o. male with history of rheumatoid arthritis. According to patient he started subcutaneous methotrexate injections on March 7. He has noticed minimal improvement in his symptoms. He continues to have pain and stiffness in his bilateral hands. Neck and lower back pain is tolerable.  Activities of Daily Living:  Patient reports morning stiffness for 10 minutes.   Patient Denies nocturnal pain.  Difficulty dressing/grooming: Denies Difficulty climbing stairs: Denies Difficulty getting out of chair: Denies Difficulty using hands for taps, buttons, cutlery, and/or writing: Reports   Review of Systems  Constitutional: Positive for fatigue. Negative for night sweats and weakness ( ).  HENT: Negative for mouth sores, mouth dryness and nose dryness.   Eyes: Negative for redness and dryness.  Respiratory: Negative for shortness of breath and difficulty breathing.   Cardiovascular: Negative for chest pain, palpitations, hypertension, irregular heartbeat and swelling in legs/feet.  Gastrointestinal: Negative for constipation and diarrhea.  Endocrine: Negative for increased urination.  Musculoskeletal: Positive for arthralgias, joint pain, joint swelling and morning stiffness. Negative for myalgias, muscle weakness, muscle tenderness and myalgias.  Skin: Negative for color change, rash, hair loss, nodules/bumps, skin tightness, ulcers and sensitivity to sunlight.  Allergic/Immunologic: Negative for susceptible to infections.  Neurological: Negative for dizziness, fainting, memory loss and night sweats.  Hematological: Negative for swollen glands.  Psychiatric/Behavioral: Negative for depressed  mood and sleep disturbance. The patient is not nervous/anxious.     PMFS History:  Patient Active Problem List   Diagnosis Date Noted  . Feeling of chest tightness, unclear cause, ? due to RA 02/21/2017  . 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 Dyspnea and respiratory abnormality 11/25/2016  . 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[place]) 11/04/2016  . High risk medication use 11/04/2016  . 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 Vitamin D deficiency 11/04/2016  . 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 History of anemia 07/02/2016  . History of colonic polyps 07/02/2016  . History of gastric ulcer 07/02/2016  . Asymptomatic microscopic hematuria 04/03/2016  . Inguinal hernia 04/03/2016  . BPH (benign prostatic hypertrophy) 04/03/2016  . Renal cyst, left 04/03/2016  . 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 DDD (degenerative disc disease), lumbar 04/03/2016  . Colonic polyp 02/23/2016  . Duodenal ulcer 02/23/2016    Past Medical History:  Diagnosis Date  . Anemia   . Atypical chest pain    a. 09/2016 MV: EF 63%, normal perfusion.  . Blood transfusion without reported diagnosis   . Diastolic dysfunction    a. 2015 Echo: EF >55%;  b. 09/2016 Echo: EF 55-60%, no rwma, Gr1 DD, triv AI/MR, mild TR, PASP 20mHg.  . Em[place]sema of lung ([place] 02/08/2016  . GERD (gastroesophageal reflux disease)   . Rheumatoid arthritis ([place]     Family History  Problem Relation Age of Onset  . Hypertension Mother   . Hypertension Father   . Diabetes Father   . Hyperlipidemia Sister   . Diabetes Sister   . Stroke Brother   . Diabetes Brother   . Colon polyps Neg  Hx   . Esophageal cancer Neg Hx   . Stomach cancer Neg Hx   . Rectal cancer Neg Hx   . Colon cancer Neg Hx    Past Surgical History:  Procedure Laterality Date  . ANTERIOR CERVICAL CORPECTOMY    . ANTERIOR CERVICAL CORPECTOMY  12/2014   for infection. this was in [place]  . CATARACT EXTRACTION     both eyes  .  COLOSTOMY REVERSAL     had infection on buttocks and had to have a skin graft- had colostomy to help area stay clean and heal  . HERNIA REPAIR    . SPINE SURGERY     Social History   Social History Narrative   Retired. Lives alone.      Objective: Vital Signs: BP 111/64   Pulse 74   Resp 13   Ht _0  (1.6 m)   Wt 152 lb (68.9 kg)   BMI 26.93 kg/m    Physical Exam  Constitutional: He is oriented to person, place, and time. He appears well-developed and well-nourished.  HENT:  Head: Normocephalic and atraumatic.  Eyes: Conjunctivae and EOM are normal. Pupils are equal, round, and reactive to light.  Neck: Normal range of motion. Neck supple.  Cardiovascular: Normal rate, regular rhythm and normal heart sounds.   Pulmonary/Chest: Effort normal and breath sounds normal.  Abdominal: Soft. Bowel sounds are normal.  Neurological: He is alert and oriented to person, place, and time.  Skin: Skin is warm and dry. Capillary refill takes less than 2 seconds.  Psychiatric: He has a normal mood and affect. His behavior is normal.  Nursing note and vitals reviewed.    Musculoskeletal Exam: C-spine and thoracic spine good range of motion he is limited range of motion of his lumbar spine. He had limited range of motion of bilateral shoulders especially his left shoulder. He tenderness on palpation over left elbow without any synovitis. He had limited range of motion of bilateral wrist joints with synovitis and tenosynovitis. He had synovitis over her MCP joints as described below. Hip joints knee joints ankles with good range of motion with no synovitis.  CDAI Exam: CDAI Homunculus Exam:   Tenderness:  RUE: wrist LUE: glenohumeral, ulnohumeral and radiohumeral and wrist Right hand: 4th MCP and 5th MCP Left hand: 2nd MCP  Swelling:  RUE: wrist LUE: wrist Right hand: 4th MCP and 5th MCP Left hand: 2nd MCP  Joint Counts:  CDAI Tender Joint count: 7 CDAI Swollen Joint count:  5  Global Assessments:  Patient Global Assessment: 5 Provider Global Assessment: 7  CDAI Calculated Score: 24    Investigation: Findings:  October 2017:  CBC was normal.  Comprehensive metabolic panel was normal.  Albumin low at 3.5.  Vitamin D was 14 which was low.  His labs from September 2017 showed low iron percent saturation of 14, magnesium was normal, uric acid 7.5, and 14-3-3 eta was negative.  08/14/2016 X-ray obtained in our office today of his left shoulder joint 3 views shows severe glenohumeral joint space narrowing without chondrocalcinosis.   Bilateral hand x-rays were also obtained which showed osteopenia, bilateral severe intercarpal joint space narrowing, bilateral severe radiocarpal joint space narrowing, left radiocarpal joint was basically fused, bilateral 1st and 2nd MCP joint narrowing, left 5th MCP erosion, left ulnar styloid erosion, carpal joint erosions bilaterally and right radial bone erosion.  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   Left hip joint x-rays showed mild-to-moderate  inferomedial narrowing and severe scoliosis was noted and partially visualized lumbar vertebrae.    Bilateral feet x-rays showed all PIP narrowing and left 1st MTP erosive change.  All MTPs were narrowed in the left foot.  He also had some left talocalcaneal joint space narrowing.  These findings were consistent with inflammatory arthritis most likely rheumatoid arthritis.     Office Visit on 02/25/2017  Component Date Value Ref Range Status  . WBC 02/25/2017 5.1  3.8 - 10.8 K/uL Final  . RBC 02/25/2017 4.82  4.20 - 5.80 MIL/uL Final  . Hemoglobin 02/25/2017 13.6  13.2 - 17.1 g/dL Final  . HCT 02/25/2017 40.4  38.5 - 50.0 % Final  . MCV 02/25/2017 83.8  80.0 - 100.0 fL Final  . MCH 02/25/2017 28.2  27.0 - 33.0 pg Final  . MCHC 02/25/2017 33.7  32.0 - 36.0 g/dL Final  . RDW 02/25/2017 17.3* 11.0 - 15.0 % Final  .  Platelets 02/25/2017 279  140 - 400 K/uL Final  . MPV 02/25/2017 8.8  7.5 - 12.5 fL Final  . Neutro Abs 02/25/2017 3366  1,500 - 7,800 cells/uL Final  . Lymphs Abs 02/25/2017 1020  850 - 3,900 cells/uL Final  . Monocytes Absolute 02/25/2017 612  200 - 950 cells/uL Final  . Eosinophils Absolute 02/25/2017 102  15 - 500 cells/uL Final  . Basophils Absolute 02/25/2017 0  0 - 200 cells/uL Final  . Neutrophils Relative % 02/25/2017 66  % Final  . Lymphocytes Relative 02/25/2017 20  % Final  . Monocytes Relative 02/25/2017 12  % Final  . Eosinophils Relative 02/25/2017 2  % Final  . Basophils Relative 02/25/2017 0  % Final  . Smear Review 02/25/2017 Criteria for review not met   Final  . Sodium 02/25/2017 136  135 - 146 mmol/L Final  . Potassium 02/25/2017 4.2  3.5 - 5.3 mmol/L Final  . Chloride 02/25/2017 103  98 - 110 mmol/L Final  . CO2 02/25/2017 26  20 - 31 mmol/L Final  . Glucose, Bld 02/25/2017 86  65 - 99 mg/dL Final  . BUN 02/25/2017 11  7 - 25 mg/dL Final  . Creat 02/25/2017 1.02  0.70 - 1.18 mg/dL Final   Comment:   For patients > or = 75 years of age: The upper reference limit for Creatinine is approximately 13% higher for people identified as African-American.     . Total Bilirubin 02/25/2017 0.7  0.2 - 1.2 mg/dL Final  . Alkaline Phosphatase 02/25/2017 68  40 - 115 U/L Final  . AST 02/25/2017 23  10 - 35 U/L Final  . ALT 02/25/2017 20  9 - 46 U/L Final  . Total Protein 02/25/2017 7.1  6.1 - 8.1 g/dL Final  . Albumin 02/25/2017 3.8  3.6 - 5.1 g/dL Final  . Calcium 02/25/2017 9.2  8.6 - 10.3 mg/dL Final  . GFR, Est African American 02/25/2017 83  >=60 mL/min Final  . GFR, Est Non African American 02/25/2017 72  >=60 mL/min Final     Imaging: No results found.  Speciality Comments: No specialty comments available.    Procedures:  No procedures performed Allergies: Patient has no known allergies.   Assessment / Plan:     Visit Diagnoses: Rheumatoid arthritis of  multiple sites with negative rheumatoid factor ([place]): He continues to have severe pain and swelling in multiple joints. He had synovitis over bilateral wrist joints and several of his MCP joints as described above. He's been on methotrexate  for a while and has tried subcutaneous methotrexate for almost 5 weeks now without good results. Different treatment options and their side effects were discussed at length today. A consent was obtained. He'll be coming later this week to start on Enbrel.  Chronic pain of left wrist: He has ongoing synovitis in bilateral wrist joints  High risk medication use - Methotrexate 0.8 ml sq every week, folic acid 2 tablets by mouth daily -I will check labs today and then every 3 months to monitor for drug toxicity. Plan: CBC with Differential/Platelet, COMPLETE METABOLIC PANEL WITH GFR, CBC with Differential/Platelet, COMPLETE METABOLIC PANEL WITH GFR  DJD (degenerative joint disease), cervical: Chronic pain  DDD (degenerative disc disease), lumbar: Chronic pain and discomfort walking  History of anemia  History of gastric ulcer  Vitamin D deficiency  Pulmonary emphysema, unspecified emphysema type (HCC)  Consider Biologics if persistent symptoms on return visit    Orders: Orders Placed This Encounter  Procedures  . CBC with Differential/Platelet  . COMPLETE METABOLIC PANEL WITH GFR  . CBC with Differential/Platelet  . COMPLETE METABOLIC PANEL WITH GFR   Meds ordered this encounter  Medications  . methotrexate 50 MG/2ML injection    Sig: Inject 0.8 mLs (20 mg total) into the skin once a week.    Dispense:  10 mL    Refill:  0    Face-to-face time spent with patient was 30 minutes. 50% of time was spent in counseling and coordination of care.  Follow-Up Instructions: Return in about 2 months (around 05/26/2017) for Rheumatoid arthritis.   Bo Merino, MD  Note - This record has been created using Editor, commissioning.  Chart creation errors have  been sought, but may not always  have been located. Such creation errors do not reflect on  the standard of medical care.

## 2017-03-20 ENCOUNTER — Encounter (HOSPITAL_COMMUNITY)
Admission: RE | Admit: 2017-03-20 | Discharge: 2017-03-20 | Disposition: A | Discharge status: Home / Self Care | Payer: Medicare Other | Source: Ambulatory Visit | Attending: Internal Medicine | Admitting: Internal Medicine

## 2017-03-20 VITALS — Wt 150.4 lb

## 2017-03-20 DIAGNOSIS — J439 Emphysema, unspecified: Secondary | ICD-10-CM

## 2017-03-20 DIAGNOSIS — J438 Other emphysema: Secondary | ICD-10-CM | POA: Diagnosis not present

## 2017-03-20 NOTE — Progress Notes (Signed)
Daily Session Note  Patient Details  Name: Tony Mcmillan MRN: [id number] Date of Birth: 06/09/1941 Referring Provider:     Pulmonary Rehab Walk Test from 01/16/2017 in [facility]  Referring Provider  Dr. Ramaswamy      Encounter Date: 03/20/2017  Check In:     Session Check In - 03/20/17 1101      Check-In   Location [facility]   Staff Present Joan Behrens, RN, BSN; , MS, ACSM RCEP, Exercise Physiologist;Lisa Hughes, RN   Supervising physician immediately available to respond to emergencies [facility] immediately available   Physician(s) Dr. Ghimire   Medication changes reported     No   Fall or balance concerns reported    No   Tobacco Cessation No Change   Warm-up and Cool-down Performed as group-led instruction   Resistance Training Performed Yes   VAD Patient? No     Pain Assessment   Currently in Pain? No/denies   Multiple Pain Sites No      Capillary Blood Glucose: No results found for this or any previous visit (from the past 24 hour(s)).      Exercise Prescription Changes - 03/20/17 1200      Response to Exercise   Blood Pressure (Admit) 112/60   Blood Pressure (Exercise) 120/74   Blood Pressure (Exit) 106/60   Heart Rate (Admit) 112 bpm   Heart Rate (Exercise) 122 bpm   Heart Rate (Exit) 88 bpm   Oxygen Saturation (Admit) 94 %   Oxygen Saturation (Exercise) 93 %   Oxygen Saturation (Exit) 94 %   Rating of Perceived Exertion (Exercise) 13   Perceived Dyspnea (Exercise) 3   Duration Progress to 45 minutes of aerobic exercise without signs/symptoms of physical distress   Intensity THRR unchanged     Progression   Progression Continue to progress workloads to maintain intensity without signs/symptoms of physical distress.     Resistance Training   Training Prescription Yes   Weight blue bands   Reps 10-15   Time 10 Minutes     Interval Training   Interval Training No     Bike    Level 0.8   Minutes 17     Track   Laps 15   Minutes 17      History  Smoking Status  . Former Smoker  . Packs/day: 1.00  . Years: 17.00  . Types: Cigarettes  . Quit date: 12/23/1993  Smokeless Tobacco  . Never Used    Goals Met:  Exercise tolerated well No report of cardiac concerns or symptoms Strength training completed today  Goals Unmet:  Not Applicable  Comments: Service time is from 10:30a to 12:30p    Dr. Wesam G. Yacoub is Medical Director for Pulmonary Rehab at  Hospital. 

## 2017-03-21 ENCOUNTER — Other Ambulatory Visit: Payer: Self-pay | Admitting: Rheumatology

## 2017-03-24 ENCOUNTER — Ambulatory Visit (INDEPENDENT_AMBULATORY_CARE_PROVIDER_SITE_OTHER): Payer: Medicare Other | Admitting: Acute Care

## 2017-03-24 ENCOUNTER — Encounter: Payer: Self-pay | Admitting: Acute Care

## 2017-03-24 DIAGNOSIS — J439 Emphysema, unspecified: Secondary | ICD-10-CM | POA: Diagnosis not present

## 2017-03-24 NOTE — Patient Instructions (Addendum)
It is good to see you today. Continue using your Bevespi 2 times daily. Continue Pulmonary Rehab as you have been doing. Continue wearing oxygen 1L at night. Follow up with Dr. Chase Caller at first available.( 3 months) Please contact office for sooner follow up if symptoms do not improve or worsen or seek emergency care

## 2017-03-24 NOTE — Progress Notes (Signed)
History of Present Illness Tony Mcmillan is a 76 y.o. male former smoker seen for pulmonary consult for dyspnea by Dr. Chase Caller. 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 .    03/24/2017 Follow Up Appointment: Pt. Presents today for follow up. He was seen by Dr. Chase Caller 02/25/2017. He had been switched from [facility] to [facility] for continued chest tightness.  Pt. States he does not really notice much of a change. He still is experiencing  some very mild chest tightness every now and again.There is no patten to the chest tightness.He states it is not bad chest tightness and that it self resolves.He was seen by cardiology 01/2017 normal ECG, and previously normal ischemic evaluation. They signed off for follow up as needed. He is agreeable to continuing on the [place] as he has notes a slight improvement. We did not start a rescue inhaler as there was no BD response on PFT. He denies any chest pain, fever, orthopnea or hemoptysis. Minimal secretions with cough.He has added calcium and Vitamin D3 as treatment to his medication regimen while on methotrexate. He states he is complaint with his nocturnal oxygen.Saturations today are 94% on RA today.He is compliant with his Pulmonary rehab, and feels he is benefiting from treatment.  Tests 2-D echo October 2017 showed grade 1 diastolic dysfunction. Cardiac stress test negative in October 2017 Walk test in the office December 2017 did not show any type of desaturation. Chest x-ray October 2017 showed clear lungs High resolution CT chest 12/03/2016 showed no ILD, mild emphysema, mild diffuse bronchial wall thickening, focal area of scarring in the right upper lobe ono 11/29/16 shows pulse ox </= 88% at 46 min. Start 1L [place]  Pulmonary function test done 11/26/2016 showed an FEV1 at 74%, ratio 82, FVC 65%, no bronchodilator response, ERV 138%, DLCO 53%  Past medical hx Past Medical History:  Diagnosis Date  . Anemia   . Atypical chest pain    a. 09/2016 MV: EF 63%,  normal perfusion.  . Blood transfusion without reported diagnosis   . Diastolic dysfunction    a. 2015 Echo: EF >55%;  b. 09/2016 Echo: EF 55-60%, no rwma, Gr1 DD, triv AI/MR, mild TR, PASP 75mHg.  . Emphysema of lung ([facility] 02/08/2016  . GERD (gastroesophageal reflux disease)   . Rheumatoid arthritis ([facility]      Past surgical hx, Family hx, Social hx all reviewed.  Current Outpatient Prescriptions on File Prior to Visit  Medication Sig  . folic acid (FOLVITE) 1 MG tablet Take 2 mg by mouth daily.  . Glycopyrrolate-Formoterol (BEVESPI AEROSPHERE) 9-4.8 MCG/ACT AERO Inhale 2 puffs into the lungs 2 (two) times daily.  . methotrexate 50 MG/2ML injection Inject 0.8 mLs (20 mg total) into the skin once a week.  . predniSONE (DELTASONE) 5 MG tablet Take 1 tablet (5 mg total) by mouth daily with breakfast. 4 tab q am x4days, 3 tab q am x4d, 2 tab q amx4 days, 1 tab q am x4d ,1/2 tab qam x4d then  d/c  . Tuberculin-Allergy Syringes 27G X 1/2" 1 ML KIT Inject 1 Syringe into the skin once a week. To be used with weekly methotrexate injections.   No current facility-administered medications on file prior to visit.      No Known Allergies  Review Of Systems:  Constitutional:   No  weight loss, night sweats,  Fevers, chills, fatigue, or  lassitude.  HEENT:   No headaches,  Difficulty swallowing,  Tooth/dental problems, or  Sore throat,  No sneezing, itching, ear ache, nasal congestion, post nasal drip,   CV:  No chest pain,  Orthopnea, PND, swelling in lower extremities, anasarca, dizziness, palpitations, syncope.   GI  No heartburn, indigestion, abdominal pain, nausea, vomiting, diarrhea, change in bowel habits, loss of appetite, bloody stools.   Resp: + mild shortness of breath at times with  exertion not at  rest.  No excess mucus, no productive cough,  No non-productive cough,  No coughing up of blood.  No change in color of mucus.  No wheezing.  No chest wall deformity  Skin:  no rash or lesions.  GU: no dysuria, change in color of urine, no urgency or frequency.  No flank pain, no hematuria   MS:  No joint pain or swelling.  No decreased range of motion.  No back pain.  Psych:  No change in mood or affect. No depression or anxiety.  No memory loss.   Vital Signs BP 120/72 (BP Location: Right Arm, Patient Position: Sitting, Cuff Size: Normal)   Pulse 60   Ht 5' 2"  (1.575 m)   Wt 150 lb (68 kg)   SpO2 94%   BMI 27.44 kg/m    Physical Exam:  General- No distress,  A&Ox3, pleasant, thin elderly male on RA ENT: No sinus tenderness, TM clear, pale nasal mucosa, no oral exudate,no post nasal drip, no LAN Cardiac: S1, S2, regular rate and rhythm, no murmur Chest: No wheeze/ rales/ dullness; no accessory muscle use, no nasal flaring, no sternal retractions Abd.: Soft Non-tender Ext: No clubbing cyanosis, edema Neuro:  normal strength Skin: No rashes, warm and dry Psych: normal mood and behavior   Assessment/Plan  Emphysema/COPD (HCC)  Stable Disease Plan: Continue using your Bevespi 2 times daily. Rinse after use Continue Pulmonary Rehab as you have been doing. Continue wearing oxygen 1L at night. Follow up with Dr. Chase Caller at first available.( 3 months) Please contact office for sooner follow up if symptoms do not improve or worsen or seek emergency care    Continue wearing oxygen at night at 1 L [place]  Continue participating in Pulmonary Rehab.    Magdalen Spatz, NP 03/24/2017  1:55 PM

## 2017-03-24 NOTE — Assessment & Plan Note (Signed)
  Stable Disease Plan: Continue using your Bevespi 2 times daily. Rinse after use Continue Pulmonary Rehab as you have been doing. Continue wearing oxygen 1L at night. Follow up with Dr. Chase Caller at first available.( 3 months) Please contact office for sooner follow up if symptoms do not improve or worsen or seek emergency care

## 2017-03-25 ENCOUNTER — Encounter (HOSPITAL_COMMUNITY)
Admission: RE | Admit: 2017-03-25 | Discharge: 2017-03-25 | Disposition: A | Discharge status: Home / Self Care | Payer: Medicare Other | Source: Ambulatory Visit | Attending: Internal Medicine | Admitting: Internal Medicine

## 2017-03-25 VITALS — Wt 152.6 lb

## 2017-03-25 DIAGNOSIS — J438 Other emphysema: Secondary | ICD-10-CM | POA: Diagnosis not present

## 2017-03-25 DIAGNOSIS — J439 Emphysema, unspecified: Secondary | ICD-10-CM

## 2017-03-25 NOTE — Progress Notes (Signed)
I have reviewed a Home Exercise Prescription with Tony Mcmillan . Tony Mcmillan is not currently exercising at home.  The patient was advised to walk 2-3 days a week for 20-25 minutes and bike for 15 minutes.  Tony Mcmillan and I discussed how to progress their exercise prescription.  The patient stated that their goals were to not use the cane anymore and increase stamina.  The patient stated that they understand the exercise prescription.  We reviewed exercise guidelines, target heart rate during exercise, oxygen use, weather, home pulse oximeter, endpoints for exercise, and goals.  Patient is encouraged to come to me with any questions. I will continue to follow up with the patient to assist them with progression and safety.

## 2017-03-25 NOTE — Progress Notes (Signed)
Daily Session Note  Patient Details  Name: Tony Mcmillan MRN: [id number] Date of Birth: 1941/04/07 Referring Provider:     Pulmonary Rehab Walk Test from 01/16/2017 in [facility]  Referring Provider  Dr. Chase Caller      Encounter Date: 03/25/2017  Check In:     Session Check In - 03/25/17 1101      Check-In   Location [facility]   Staff Present Su Hilt, MS, ACSM RCEP, Exercise Physiologist; Ysidro Evert, RN;Joann Rion, RN, Luisa Hart, RN, BSN   Supervising physician immediately available to respond to emergencies [facility] immediately available   Physician(s) Dr. Doyle Askew   Medication changes reported     No   Fall or balance concerns reported    No   Tobacco Cessation No Change   Warm-up and Cool-down Performed as group-led instruction   Resistance Training Performed Yes   VAD Patient? No     Pain Assessment   Currently in Pain? No/denies   Multiple Pain Sites No      Capillary Blood Glucose: No results found for this or any previous visit (from the past 24 hour(s)).      Exercise Prescription Changes - 03/25/17 1200      Response to Exercise   Blood Pressure (Admit) 128/72   Blood Pressure (Exercise) 124/60   Blood Pressure (Exit) 116/78   Heart Rate (Admit) 85 bpm   Heart Rate (Exercise) 98 bpm   Heart Rate (Exit) 81 bpm   Oxygen Saturation (Admit) 95 %   Oxygen Saturation (Exercise) 96 %   Oxygen Saturation (Exit) 97 %   Rating of Perceived Exertion (Exercise) 13   Symptoms 3   Duration Progress to 45 minutes of aerobic exercise without signs/symptoms of physical distress   Intensity THRR unchanged     Progression   Progression Continue to progress workloads to maintain intensity without signs/symptoms of physical distress.     Resistance Training   Training Prescription Yes   Weight blue bands   Reps 10-15   Time 10 Minutes     Interval Training   Interval Training No     Bike   Level 0.8   Minutes 17     NuStep   Level 5   Minutes 17   METs 2.2     Track   Laps 13   Minutes 17      History  Smoking Status  . Former Smoker  . Packs/day: 1.00  . Years: 17.00  . Types: Cigarettes  . Quit date: 12/23/1993  Smokeless Tobacco  . Never Used    Goals Met:  No report of cardiac concerns or symptoms Strength training completed today  Goals Unmet:  Not Applicable  Comments: Service time is from 1030 to 1200    Dr. Rush Farmer is Medical Director for Pulmonary Rehab at [facility].

## 2017-03-26 ENCOUNTER — Encounter: Payer: Self-pay | Admitting: Rheumatology

## 2017-03-26 ENCOUNTER — Telehealth: Payer: Self-pay | Admitting: Pharmacist

## 2017-03-26 ENCOUNTER — Ambulatory Visit (INDEPENDENT_AMBULATORY_CARE_PROVIDER_SITE_OTHER): Payer: Medicare Other | Admitting: Rheumatology

## 2017-03-26 VITALS — BP 111/64 | HR 74 | Resp 13 | Ht 63.0 in | Wt 152.0 lb

## 2017-03-26 DIAGNOSIS — M5030 Other cervical disc degeneration, unspecified cervical region: Secondary | ICD-10-CM

## 2017-03-26 DIAGNOSIS — M5136 Other intervertebral disc degeneration, lumbar region: Secondary | ICD-10-CM

## 2017-03-26 DIAGNOSIS — Z862 Personal history of diseases of the blood and blood-forming organs and certain disorders involving the immune mechanism: Secondary | ICD-10-CM

## 2017-03-26 DIAGNOSIS — Z8719 Personal history of other diseases of the digestive system: Secondary | ICD-10-CM

## 2017-03-26 DIAGNOSIS — J439 Emphysema, unspecified: Secondary | ICD-10-CM | POA: Diagnosis not present

## 2017-03-26 DIAGNOSIS — M25532 Pain in left wrist: Secondary | ICD-10-CM

## 2017-03-26 DIAGNOSIS — E559 Vitamin D deficiency, unspecified: Secondary | ICD-10-CM | POA: Diagnosis not present

## 2017-03-26 DIAGNOSIS — Z79899 Other long term (current) drug therapy: Secondary | ICD-10-CM

## 2017-03-26 DIAGNOSIS — M0609 Rheumatoid arthritis without rheumatoid factor, multiple sites: Secondary | ICD-10-CM | POA: Diagnosis not present

## 2017-03-26 DIAGNOSIS — G8929 Other chronic pain: Secondary | ICD-10-CM | POA: Diagnosis not present

## 2017-03-26 DIAGNOSIS — Z8711 Personal history of peptic ulcer disease: Secondary | ICD-10-CM

## 2017-03-26 DIAGNOSIS — M47812 Spondylosis without myelopathy or radiculopathy, cervical region: Secondary | ICD-10-CM

## 2017-03-26 LAB — CBC WITH DIFFERENTIAL/PLATELET
Basophils Absolute: 0 cells/uL (ref 0–200)
Basophils Relative: 0 %
Eosinophils Absolute: 110 cells/uL (ref 15–500)
Eosinophils Relative: 2 %
HCT: 39.8 % (ref 38.5–50.0)
Hemoglobin: 13.5 g/dL (ref 13.2–17.1)
Lymphocytes Relative: 18 %
Lymphs Abs: 990 cells/uL (ref 850–3900)
MCH: 28.5 pg (ref 27.0–33.0)
MCHC: 33.9 g/dL (ref 32.0–36.0)
MCV: 84 fL (ref 80.0–100.0)
MPV: 9 fL (ref 7.5–12.5)
Monocytes Absolute: 605 cells/uL (ref 200–950)
Monocytes Relative: 11 %
Neutro Abs: 3795 cells/uL (ref 1500–7800)
Neutrophils Relative %: 69 %
Platelets: 257 10*3/uL (ref 140–400)
RBC: 4.74 MIL/uL (ref 4.20–5.80)
RDW: 17.1 % — ABNORMAL HIGH (ref 11.0–15.0)
WBC: 5.5 10*3/uL (ref 3.8–10.8)

## 2017-03-26 LAB — COMPLETE METABOLIC PANEL WITH GFR
ALT: 28 U/L (ref 9–46)
AST: 23 U/L (ref 10–35)
Albumin: 3.6 g/dL (ref 3.6–5.1)
Alkaline Phosphatase: 69 U/L (ref 40–115)
BUN: 18 mg/dL (ref 7–25)
CO2: 22 mmol/L (ref 20–31)
Calcium: 9 mg/dL (ref 8.6–10.3)
Chloride: 106 mmol/L (ref 98–110)
Creat: 1.07 mg/dL (ref 0.70–1.18)
GFR, Est African American: 78 mL/min (ref >=60)
GFR, Est Non African American: 68 mL/min (ref >=60)
Glucose, Bld: 83 mg/dL (ref 65–99)
Potassium: 4.1 mmol/L (ref 3.5–5.3)
Sodium: 137 mmol/L (ref 135–146)
Total Bilirubin: 1.2 mg/dL (ref 0.2–1.2)
Total Protein: 6.7 g/dL (ref 6.1–8.1)

## 2017-03-26 MED ORDER — METHOTREXATE SODIUM CHEMO INJECTION 50 MG/2ML: 20.0000 mg | INTRAMUSCULAR | 0 refills | Status: DC

## 2017-03-26 NOTE — Patient Instructions (Signed)
Etanercept injection What is this medicine? ETANERCEPT (et a NER sept) is used for the treatment of rheumatoid arthritis in adults and children. The medicine is also used to treat psoriatic arthritis, ankylosing spondylitis, and psoriasis. This medicine may be used for other purposes; ask your health care provider or pharmacist if you have questions. COMMON BRAND NAME(S): Enbrel What should I tell my health care provider before I take this medicine? They need to know if you have any of these conditions: -blood disorders -cancer -congestive heart failure -diabetes -exposure to chickenpox -immune system problems -infection -multiple sclerosis -seizure disorder -tuberculosis, a positive skin test for tuberculosis or have recently been in close contact with someone who has tuberculosis -Wegener's granulomatosis -an unusual or allergic reaction to etanercept, latex, other medicines, foods, dyes, or preservatives -pregnant or trying to get pregnant -breast-feeding How should I use this medicine? The medicine is given by injection under the skin. You will be taught how to prepare and give this medicine. Use exactly as directed. Take your medicine at regular intervals. Do not take your medicine more often than directed. It is important that you put your used needles and syringes in a special sharps container. Do not put them in a trash can. If you do not have a sharps container, call your pharmacist or healthcare provider to get one. A special MedGuide will be given to you by the pharmacist with each prescription and refill. Be sure to read this information carefully each time. Talk to your pediatrician regarding the use of this medicine in children. While this drug may be prescribed for children as young as 4 years of age for selected conditions, precautions do apply. Overdosage: If you think you have taken too much of this medicine contact a poison control center or emergency room at once. NOTE:  This medicine is only for you. Do not share this medicine with others. What if I miss a dose? If you miss a dose, contact your health care professional to find out when you should take your next dose. Do not take double or extra doses without advice. What may interact with this medicine? Do not take this medicine with any of the following medications: -anakinra This medicine may also interact with the following medications: -cyclophosphamide -sulfasalazine -vaccines This list may not describe all possible interactions. Give your health care provider a list of all the medicines, herbs, non-prescription drugs, or dietary supplements you use. Also tell them if you smoke, drink alcohol, or use illegal drugs. Some items may interact with your medicine. What should I watch for while using this medicine? Tell your doctor or healthcare professional if your symptoms do not start to get better or if they get worse. You will be tested for tuberculosis (TB) before you start this medicine. If your doctor prescribes any medicine for TB, you should start taking the TB medicine before starting this medicine. Make sure to finish the full course of TB medicine. Call your doctor or health care professional for advice if you get a fever, chills or sore throat, or other symptoms of a cold or flu. Do not treat yourself. This drug decreases your body's ability to fight infections. Try to avoid being around people who are sick. What side effects may I notice from receiving this medicine? Side effects that you should report to your doctor or health care professional as soon as possible: -allergic reactions like skin rash, itching or hives, swelling of the face, lips, or tongue -changes in vision -fever, chills   or any other sign of infection -numbness or tingling in legs or other parts of the body -red, scaly patches or raised bumps on the skin -shortness of breath or difficulty breathing -swollen lymph nodes in the  neck, underarm, or groin areas -unexplained weight loss -unusual bleeding or bruising -unusual swelling or fluid retention in the legs -unusually weak or tired Side effects that usually do not require medical attention (report to your doctor or health care professional if they continue or are bothersome): -dizziness -headache -nausea -redness, itching, or swelling at the injection site -vomiting This list may not describe all possible side effects. Call your doctor for medical advice about side effects. You may report side effects to FDA at 1-800-FDA-1088. Where should I keep my medicine? Keep out of the reach of children. Store between 2 and 8 degrees C (36 and 46 degrees F). Do not freeze or shake. Protect from light. Throw away any unused medicine after the expiration date. You will be instructed on how to store this medicine. NOTE: This sheet is a summary. It may not cover all possible information. If you have questions about this medicine, talk to your doctor, pharmacist, or health care provider.  2018 Elsevier/Gold Standard (2012-06-15 15:33:36)  

## 2017-03-26 NOTE — Telephone Encounter (Signed)
Enbrel has been approved for patient.  Patient was been approved from 01/27/17-05/27/17.  Case number: [id number].    I called him to set up initial nurse visit for Enbrel.  Left patient a message asking him to call the office.

## 2017-03-26 NOTE — Telephone Encounter (Signed)
Received return call from patient.  He is scheduled for nursing visit tomorrow at 10:00 AM for initial Enbrel injection using sample medication.    Elisabeth Most, Pharm.D., BCPS, CPP Clinical Pharmacist Pager: [phone] Phone: [phone] 03/26/2017 4:43 PM

## 2017-03-26 NOTE — Progress Notes (Signed)
Pharmacy Note  Subjective: Patient presents today to the [facility] to see Dr. Estanislado Pandy.   He is currently taking methotrexate 0.8 mL weekly and folic acid 2 mg daily.  He reports he completed his prednisone course on 03/19/17.   Does the patient feel that his/her medications are working for him/her?  No - patient reports he has not noticed much improvement on injectable methotrexate Has the patient been experiencing any side effects to the medications prescribed?  No Does the patient have any problems obtaining medications?  No  Patient seen by the pharmacist for counseling on Enbrel.    Objective: TB Test: negative (08/14/16) Hepatitis panel: negative (08/14/16) HIV: negative (08/14/16)  CBC    Component Value Date/Time   WBC 5.1 02/25/2017 1658   RBC 4.82 02/25/2017 1658   HGB 13.6 02/25/2017 1658   HCT 40.4 02/25/2017 1658   PLT 279 02/25/2017 1658   MCV 83.8 02/25/2017 1658   MCH 28.2 02/25/2017 1658   MCHC 33.7 02/25/2017 1658   RDW 17.3 (H) 02/25/2017 1658   LYMPHSABS 1,020 02/25/2017 1658   MONOABS 612 02/25/2017 1658   EOSABS 102 02/25/2017 1658   BASOSABS 0 02/25/2017 1658    Assessment/Plan:  Counseled patient that Enbrel is a TNF blocking agent.  Reviewed Enbrel dose of 50 mg once weekly.  Counseled patient on purpose, proper use, and adverse effects of Enbrel.  Reviewed the most common adverse effects including infections, headache, and injection site reactions. Discussed that there is the possibility of an increased risk of malignancy but it is not well understood if this increased risk is due to the medication or the disease state.  Advised patient to get yearly dermatology exams due to risk of skin cancer.  Reviewed the importance of regular labs while on Enbrel therapy.  Advised patient to get standing labs one month after starting Enbrel then every 2 months.  Provided patient with standing lab orders.  Counseled patient that Enbrel should be held prior  to scheduled surgery.  Counseled patient to avoid live vaccines while on Enbrel.  Advised patient to get annual influenza vaccine and the pneumococcal vaccine as needed.  Provided patient with medication education material and answered all questions.  Patient voiced understanding.  Patient consented to Enbrel.  Will upload consent into the media tab.  Reviewed storage instructions for Enbrel.  Advised patient to contact the office to schedule the first Enbrel shot once Enbrel is approved by insurance.  Patient voiced understanding.    Patient reports difficulty using the tuberculin syringes that he picked up from the pharmacy.  The caps are difficult to get off.  Patient brought the syringes to clinic and I also had difficulty getting the caps off.  The syringes may be defective.  Patient was given several syringes from the clinic.  Also advised him to discuss getting new syringes at the pharmacy.  Patient voiced understanding.    Elisabeth Most, Pharm.D., BCPS Clinical Pharmacist Pager: [phone] Phone: [phone] 03/26/2017 2:48 PM

## 2017-03-27 ENCOUNTER — Encounter (HOSPITAL_COMMUNITY): Payer: Medicare Other

## 2017-03-27 ENCOUNTER — Ambulatory Visit (INDEPENDENT_AMBULATORY_CARE_PROVIDER_SITE_OTHER): Payer: Medicare Other | Admitting: *Deleted

## 2017-03-27 VITALS — BP 111/69 | HR 91

## 2017-03-27 DIAGNOSIS — M0579 Rheumatoid arthritis with rheumatoid factor of multiple sites without organ or systems involvement: Secondary | ICD-10-CM

## 2017-03-27 MED ORDER — ETANERCEPT 50 MG/ML ~~LOC~~ SOSY
50.0000 mg | PREFILLED_SYRINGE | Freq: Once | SUBCUTANEOUS | Status: AC
  Administered 2017-03-27: 50 mg via SUBCUTANEOUS

## 2017-03-27 MED ORDER — ETANERCEPT 50 MG/ML ~~LOC~~ SOAJ: 50.0000 mg | SUBCUTANEOUS | 2 refills | Status: DC

## 2017-03-27 NOTE — Progress Notes (Signed)
Labs are normal.

## 2017-03-27 NOTE — Patient Instructions (Addendum)
Start taking Enbrel 50 mg once weekly.   Your prescription was sent to [facility].  They should contact you about delivery of your prescription.  If you do not hear from them, call them.  Their phone number is [phone]    Standing Labs We placed an order today for your standing lab work.    Please come back and get your standing labs in 1 month then every 2 months.  We have open lab Monday through Friday from 8:30-11:30 AM and 1:30-4 PM at the office of Dr. Tresa Moore, PA.   The office is located at [address] [place], [place], [place] [address] No appointment is necessary.   Labs are drawn by [facility].  You may receive a bill from [facility] for your lab work.

## 2017-03-27 NOTE — Progress Notes (Signed)
Patient in office today for an initial start to Enbrel. Patient tolerated injection well. Patient monitored in office for 30 minutes after injection administered for adverse reactions. No adverse reactions noted.  Administrations This Visit    etanercept (ENBREL) 50 MG/ML injection 50 mg    Admin Date 03/27/2017 Action Given Dose 50 mg Route Subcutaneous Administered By Carole Binning, LPN

## 2017-03-27 NOTE — Progress Notes (Signed)
Pharmacy Note  Subjective:   Patient is being initiated on Enbrel.  Patient was previously counseled extensively on Enbrel on 03/26/2017 and consented to initiation of Enbrel at that time.  Patient presents to clinic today to receive the first dose of Enbrel.     Objective: CMP     Component Value Date/Time   NA 137 03/26/2017 1724   K 4.1 03/26/2017 1724   CL 106 03/26/2017 1724   CO2 22 03/26/2017 1724   GLUCOSE 83 03/26/2017 1724   BUN 18 03/26/2017 1724   CREATININE 1.07 03/26/2017 1724   CALCIUM 9.0 03/26/2017 1724   PROT 6.7 03/26/2017 1724   ALBUMIN 3.6 03/26/2017 1724   AST 23 03/26/2017 1724   ALT 28 03/26/2017 1724   ALKPHOS 69 03/26/2017 1724   BILITOT 1.2 03/26/2017 1724   GFRNONAA 68 03/26/2017 1724   GFRAA 78 03/26/2017 1724   CBC    Component Value Date/Time   WBC 5.5 03/26/2017 1724   RBC 4.74 03/26/2017 1724   HGB 13.5 03/26/2017 1724   HCT 39.8 03/26/2017 1724   PLT 257 03/26/2017 1724   MCV 84.0 03/26/2017 1724   MCH 28.5 03/26/2017 1724   MCHC 33.9 03/26/2017 1724   RDW 17.1 (H) 03/26/2017 1724   LYMPHSABS 990 03/26/2017 1724   MONOABS 605 03/26/2017 1724   EOSABS 110 03/26/2017 1724   BASOSABS 0 03/26/2017 1724   TB Gold: negative (08/14/16)  Assessment/Plan:  Patient was counseled on how to administer subcutaneous Enbrel injection using a demonstration pen.  Enbrel was administered in clinic using sample pen.  Lot: [id number], Exp. 4/20.  Enbrel prescription was sent to [facility].  Provided patient with [facility]'s phone number in case he does not hear from them in a day or two regarding his prescription.  Also assisted patient in signing up for Enbrel copay assistance program while in clinic.  He should receive copay card in 2 business days.  Patient is aware he must provide [facility] with the information from the copay card.    Patient was monitored for 30 minutes post injection.  No injection site reaction noted.  Patient will need  standing lab orders in one month.  Provided patient with standing lab instructions.  Patient is scheduled for follow up on 05/28/17.    Elisabeth Most, Pharm.D., BCPS Clinical Pharmacist Pager: [phone] Phone: [phone] 03/27/2017 9:48 AM

## 2017-04-01 ENCOUNTER — Encounter (HOSPITAL_COMMUNITY)
Admission: RE | Admit: 2017-04-01 | Discharge: 2017-04-01 | Disposition: A | Discharge status: Home / Self Care | Payer: Medicare Other | Source: Ambulatory Visit | Attending: Internal Medicine | Admitting: Internal Medicine

## 2017-04-01 ENCOUNTER — Telehealth: Payer: Self-pay | Admitting: Pharmacist

## 2017-04-01 VITALS — Wt 151.5 lb

## 2017-04-01 DIAGNOSIS — J438 Other emphysema: Secondary | ICD-10-CM | POA: Diagnosis not present

## 2017-04-01 DIAGNOSIS — J439 Emphysema, unspecified: Secondary | ICD-10-CM

## 2017-04-01 NOTE — Telephone Encounter (Addendum)
Patient started Enbrel on 03/27/17.  I called patient to follow up on whether he received Enbrel copay card in the mail and whether he was able to set up shipment of his Enbrel.  I left a message asking patient to return my call.   Received a return call from patient stating he has not received his Enbrel card in the mail yet.  He said his card was mailed to a wrong address, and they are resending it.  He is supposed to receive the card today.  Tony Mcmillan reports he has not heard from [facility] regarding his prescription.  I again gave patient the phone number for [facility].  Advised him to call [facility] once he has his Enbrel copay card to set up shipment of his medication.  Patient voiced understanding.    Elisabeth Most, Pharm.D., BCPS, CPP Clinical Pharmacist Pager: [phone] Phone: [phone] 04/01/2017 8:21 AM

## 2017-04-01 NOTE — Progress Notes (Signed)
Pulmonary Individual Treatment Plan  Patient Details  Name: Tony Mcmillan MRN: [id number] Date of Birth: 10/11/1941 Referring Provider:     Pulmonary Rehab Walk Test from 01/16/2017 in [facility]  Referring Provider  Dr. Ramaswamy      Initial Encounter Date:    Pulmonary Rehab Walk Test from 01/16/2017 in [facility]  Date  01/16/17  Referring Provider  Dr. Ramaswamy      Visit Diagnosis: Pulmonary emphysema, unspecified emphysema type (HCC)  Patient's Home Medications on Admission:   Current Outpatient Prescriptions:  .  Calcium Carb-Cholecalciferol (CALCIUM-VITAMIN D3) 600-400 MG-UNIT TABS, Take by mouth., Disp: , Rfl:  .  etanercept (ENBREL SURECLICK) 50 MG/ML injection, Inject 0.98 mLs (50 mg total) into the skin once a week., Disp: 3.92 mL, Rfl: 2 .  ferrous sulfate 325 (65 FE) MG tablet, Take 1 tablet by mouth daily., Disp: , Rfl: 0 .  fluticasone (FLONASE) 50 MCG/ACT nasal spray, Place 1 spray into both nostrils daily as needed., Disp: , Rfl:  .  folic acid (FOLVITE) 1 MG tablet, Take 2 mg by mouth daily., Disp: , Rfl:  .  Glycopyrrolate-Formoterol (BEVESPI AEROSPHERE) 9-4.8 MCG/ACT AERO, Inhale 2 puffs into the lungs 2 (two) times daily., Disp: 1 Inhaler, Rfl: 3 .  methotrexate 50 MG/2ML injection, Inject 0.8 mLs (20 mg total) into the skin once a week., Disp: 10 mL, Rfl: 0 .  predniSONE (DELTASONE) 5 MG tablet, Take 1 tablet (5 mg total) by mouth daily with breakfast. 4 tab q am x4days, 3 tab q am x4d, 2 tab q amx4 days, 1 tab q am x4d ,1/2 tab qam x4d then  d/c (Patient not taking: Reported on 03/26/2017), Disp: 42 tablet, Rfl: 0 .  Tuberculin-Allergy Syringes 27G X 1/2" 1 ML KIT, Inject 1 Syringe into the skin once a week. To be used with weekly methotrexate injections., Disp: 12 each, Rfl: 3  Past Medical History: Past Medical History:  Diagnosis Date  . Anemia   . Atypical chest pain    a. 09/2016 MV: EF 63%,  normal perfusion.  . Blood transfusion without reported diagnosis   . Diastolic dysfunction    a. 2015 Echo: EF >55%;  b. 09/2016 Echo: EF 55-60%, no rwma, Gr1 DD, triv AI/MR, mild TR, PASP 28mmHg.  . Emphysema of lung (HCC) 02/08/2016  . GERD (gastroesophageal reflux disease)   . Rheumatoid arthritis (HCC)     Tobacco Use: History  Smoking Status  . Former Smoker  . Packs/day: 1.00  . Years: 17.00  . Types: Cigarettes  . Quit date: 12/23/1993  Smokeless Tobacco  . Never Used    Labs: Recent Review Flowsheet Data    Labs for ITP Cardiac and Pulmonary Rehab Latest Ref Rng & Units 03/19/2016   Cholestrol 125 - 200 mg/dL 206(H)   LDLCALC <130 mg/dL 115   HDL >=40 mg/dL 49   Trlycerides <150 mg/dL 210(H)      Capillary Blood Glucose: No results found for: GLUCAP   ADL UCSD:   Pulmonary Function Assessment:     Pulmonary Function Assessment - 01/13/17 1248      Breath   Bilateral Breath Sounds Clear   Shortness of Breath No      Exercise Target Goals:    Exercise Program Goal: Individual exercise prescription set with THRR, safety & activity barriers. Participant demonstrates ability to understand and report RPE using BORG scale, to self-measure pulse accurately, and to acknowledge the importance   of the exercise prescription.  Exercise Prescription Goal: Starting with aerobic activity 30 plus minutes a day, 3 days per week for initial exercise prescription. Provide home exercise prescription and guidelines that participant acknowledges understanding prior to discharge.  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 HR 127 bpm     Max Ex. BP 138/84       Interval HR   Baseline HR 94     1 Minute HR 112     2 Minute HR 126     3 Minute HR 128     4 Minute HR 118     5 Minute HR 124     6 Minute HR 127     2 Minute Post HR 92     Interval Heart Rate? Yes       Interval Oxygen   Interval Oxygen? Yes     Baseline Oxygen Saturation % 94 %     Baseline Liters of Oxygen 0 L     1 Minute Oxygen Saturation % 95 %     1 Minute Liters of Oxygen 0 L     2 Minute Oxygen Saturation % 94 %     2 Minute Liters of Oxygen 0 L     3 Minute Oxygen Saturation % 95 %     3 Minute Liters of Oxygen 0 L     4 Minute Oxygen Saturation % 95 %     4 Minute Liters of Oxygen 0 L     5 Minute Oxygen Saturation % 96 %     5 Minute Liters of Oxygen 0 L     6 Minute Oxygen Saturation % 96 %     6 Minute Liters of Oxygen 0 L     2 Minute Post Oxygen Saturation % 97 %     2 Minute Post Liters of Oxygen 0 L        Oxygen Initial Assessment:   Oxygen Re-Evaluation:     Oxygen Re-Evaluation    Row Name 03/03/17 1104 04/01/17 0727           Program Oxygen Prescription   Program Oxygen Prescription None None        Home Oxygen   Home Oxygen Device None None         Oxygen Discharge (Final Oxygen Re-Evaluation):     Oxygen Re-Evaluation - 04/01/17 0727      Program Oxygen Prescription   Program Oxygen Prescription None     Home Oxygen   Home Oxygen Device None  Initial Exercise Prescription:     Initial Exercise Prescription - 01/16/17 1600      Date of Initial Exercise RX and Referring Provider   Date 01/16/17   Referring Provider Dr. Chase Caller     Oxygen   Oxygen --     Bike   Level 0.5   Minutes 17     NuStep   Level 2   Minutes 17   METs 1.5     Track   Laps 5   Minutes 17      Prescription Details   Frequency (times per week) 2   Duration Progress to 45 minutes of aerobic exercise without signs/symptoms of physical distress     Intensity   THRR 40-80% of Max Heartrate 58-117   Ratings of Perceived Exertion 11-13   Perceived Dyspnea 0-4     Progression   Progression Continue progressive overload as per policy without signs/symptoms or physical distress.     Resistance Training   Training Prescription Yes   Weight blue bands   Reps 10-12      Perform Capillary Blood Glucose checks as needed.  Exercise Prescription Changes:     Exercise Prescription Changes    Row Name 01/23/17 1200 01/28/17 1200 01/30/17 1300 02/04/17 1300 02/11/17 1200     Response to Exercise   Blood Pressure (Admit) 104/60 114/60 108/60 118/70 108/76   Blood Pressure (Exercise) 106/72 150/74 150/80 138/84 156/80   Blood Pressure (Exit) 120/60 114/70 122/70 128/80 102/64   Heart Rate (Admit) 83 bpm 79 bpm 85 bpm 90 bpm 98 bpm   Heart Rate (Exercise) 94 bpm 109 bpm 103 bpm 113 bpm 106 bpm   Heart Rate (Exit) 78 bpm 82 bpm 90 bpm 98 bpm 95 bpm   Oxygen Saturation (Admit) 95 % 92 % 94 % 94 % 98 %   Oxygen Saturation (Exercise) 96 % 92 % 93 % 94 % 94 %   Oxygen Saturation (Exit) 96 % 95 % 94 % 94 % 95 %   Rating of Perceived Exertion (Exercise) _0 Perceived Dyspnea (Exercise) _1 Duration Progress to 45 minutes of aerobic exercise without signs/symptoms of physical distress Progress to 45 minutes of aerobic exercise without signs/symptoms of physical distress Progress to 45 minutes of aerobic exercise without signs/symptoms of physical distress Progress to 45 minutes of aerobic exercise without signs/symptoms of physical distress Progress to 45 minutes of aerobic exercise without signs/symptoms of physical distress   Intensity -  40-80% HRR -  40-80% HRR THRR unchanged THRR unchanged THRR unchanged     Progression   Progression Continue to progress  workloads to maintain intensity without signs/symptoms of physical distress. Continue to progress workloads to maintain intensity without signs/symptoms of physical distress. Continue to progress workloads to maintain intensity without signs/symptoms of physical distress. Continue to progress workloads to maintain intensity without signs/symptoms of physical distress. Continue to progress workloads to maintain intensity without signs/symptoms of physical distress.     Resistance Training   Training Prescription _2    Weight _3    Reps 10-12  10 minutes of strength training 10-12  10 minutes of strength training 10-12  10 minutes of strength training 10-12  10 minutes of strength training 10-12  10 minutes of strength training     Interval Training   Interval Training _4   Bike   Level 0.5 0.5  - 0.5 0.5   Minutes 17 17  - 17 17     NuStep   Level  - 2 3 4 4   Minutes  - 17 17 17 17   METs  - 1.9 2.4 2.5 2.4     Track   Laps 8 8 11 14 13   Minutes 17 17 17 17 17     Exercise Review   Progression  -  - Yes Yes  -   Row Name 02/13/17 1200 02/18/17 1200 02/20/17 1231 02/25/17 1200 02/27/17 1200     Response to Exercise   Blood Pressure (Admit) 124/74 130/66 118/80 114/76 110/56   Blood Pressure (Exercise) 140/80 140/72 166/80 126/70 130/80   Blood Pressure (Exit) 100/64 108/70 108/60 100/60 104/70   Heart Rate (Admit) 86 bpm 96 bpm 94 bpm 79 bpm 96 bpm   Heart Rate (Exercise) 114 bpm 124 bpm 134 bpm 124 bpm 111 bpm   Heart Rate (Exit) 90 bpm 74 bpm 86 bpm 86 bpm 100 bpm   Oxygen Saturation (Admit) 98 % 93 % 93 % 94 % 91 %   Oxygen Saturation (Exercise) 91 % 93 % 93 % 92 % 92 %   Oxygen Saturation (Exit) 94 % 99 % 96 % 94 % 93 %   Rating of Perceived Exertion (Exercise) 12 13 13 13 13   Perceived Dyspnea (Exercise) 3 2 2 2 2   Duration Progress to 45 minutes of aerobic exercise without  signs/symptoms of physical distress Progress to 45 minutes of aerobic exercise without signs/symptoms of physical distress Progress to 45 minutes of aerobic exercise without signs/symptoms of physical distress Progress to 45 minutes of aerobic exercise without signs/symptoms of physical distress Progress to 45 minutes of aerobic exercise without signs/symptoms of physical distress   Intensity THRR unchanged THRR unchanged THRR unchanged THRR unchanged THRR unchanged     Progression   Progression Continue to progress workloads to maintain intensity without signs/symptoms of physical distress. Continue to progress workloads to maintain intensity without signs/symptoms of physical distress. Continue to progress workloads to maintain intensity without signs/symptoms of physical distress. Continue to progress workloads to maintain intensity without signs/symptoms of physical distress. Continue to progress workloads to maintain intensity without signs/symptoms of physical distress.     Resistance Training   Training Prescription Yes Yes Yes Yes Yes   Weight blue bands blue bands blue bands blue bands blue bands   Reps 10-12  10 minutes of strength training 10-15 10-15 10-15 10-15   Time  - 10 Minutes 10 Minutes 10 Minutes 10 Minutes     Interval Training   Interval Training No No No No No     Bike   Level  - 0.5 0.8 0.8  -   Minutes  - 17 17 17  -     NuStep   Level 4 4  - 4 4   Minutes 17 17  - 17 17   METs 2.4 2.4  -  - 2.1     Track   Laps 14 13 14 15 13   Minutes 17 17 17 17 17   Row Name 03/04/17 1243 03/06/17 1300 03/11/17 1200 03/13/17 1235 03/18/17 1200     Response to Exercise   Blood Pressure (Admit) 112/62 128/62 114/68 114/52 108/62   Blood Pressure (Exercise) 142/62 134/76 144/80 130/80 154/81   Blood Pressure (Exit) 118/64 120/76 96/60 102/58 104/64   Heart   Rate (Admit) 89 bpm 79 bpm 95 bpm 91 bpm 92 bpm   Heart Rate (Exercise) 115 bpm 128 bpm 125 bpm 106 bpm 109 bpm    Heart Rate (Exit) 89 bpm 90 bpm 101 bpm 75 bpm 83 bpm   Oxygen Saturation (Admit) 94 % 92 % 94 % 94 % 94 %   Oxygen Saturation (Exercise) 96 % 92 % 94 % 92 % 93 %   Oxygen Saturation (Exit) 94 % 93 % 96 % 97 % 96 %   Rating of Perceived Exertion (Exercise) _0 Perceived Dyspnea (Exercise) _1 Duration Progress to 45 minutes of aerobic exercise without signs/symptoms of physical distress Progress to 45 minutes of aerobic exercise without signs/symptoms of physical distress Progress to 45 minutes of aerobic exercise without signs/symptoms of physical distress Progress to 45 minutes of aerobic exercise without signs/symptoms of physical distress Progress to 45 minutes of aerobic exercise without signs/symptoms of physical distress   Intensity _2      Progression   Progression Continue to progress workloads to maintain intensity without signs/symptoms of physical distress. Continue to progress workloads to maintain intensity without signs/symptoms of physical distress. Continue to progress workloads to maintain intensity without signs/symptoms of physical distress. Continue to progress workloads to maintain intensity without signs/symptoms of physical distress. Continue to progress workloads to maintain intensity without signs/symptoms of physical distress.     Resistance Training   Training Prescription _3    Weight _4    Reps 10-15 10-15 10-15 10-15 10-15   Time 10 Minutes 10 Minutes 10 Minutes 10 Minutes 10 Minutes     Interval Training   Interval Training _5      Bike   Level 0.8 0.8 0.8  - 0.8   Minutes _6 - 17     NuStep   Level _7 Minutes _8 METs 2.3 2.4 2.3 2.3 2.2     Track   Laps 16  - 16 14  -   Minutes 17  - 17 17  -     Exercise Review   Progression  -  -  -  - Yes   Row Name  03/20/17 1200 03/25/17 1200 04/01/17 1200         Response to Exercise   Blood Pressure (Admit) 112/60 128/72 120/64     Blood Pressure (Exercise) 120/74 124/60 140/80     Blood Pressure (Exit) 106/60 116/78 98/62     Heart Rate (Admit) 112 bpm 85 bpm 83 bpm     Heart Rate (Exercise) 122 bpm 98 bpm 107 bpm     Heart Rate (Exit) 88 bpm 81 bpm 89 bpm     Oxygen Saturation (Admit) 94 % 95 % 93 %     Oxygen Saturation (Exercise) 93 % 96 % 91 %     Oxygen Saturation (Exit) 94 % 97 % 94 %     Rating of Perceived Exertion (Exercise) _9 Perceived Dyspnea (Exercise) 3  - 3     Symptoms  - 3  -     Duration Progress to 45 minutes of aerobic exercise without signs/symptoms of physical distress Progress to 45 minutes of aerobic exercise without signs/symptoms  of physical distress Progress to 45 minutes of aerobic exercise without signs/symptoms of physical distress     Intensity THRR unchanged THRR unchanged THRR unchanged       Progression   Progression Continue to progress workloads to maintain intensity without signs/symptoms of physical distress. Continue to progress workloads to maintain intensity without signs/symptoms of physical distress. Continue to progress workloads to maintain intensity without signs/symptoms of physical distress.       Resistance Training   Training Prescription Yes Yes Yes     Weight blue bands blue bands blue bands     Reps 10-15 10-15 10-15     Time 10 Minutes 10 Minutes 10 Minutes       Interval Training   Interval Training No No No       Bike   Level 0.8 0.8 0.8     Minutes _0 NuStep   Level  - 5 5     Minutes  - 17 17     METs  - 2.2 2.5       Track   Laps _1 Minutes _2 Home Exercise Plan   Plans to continue exercise at  - Home (comment)  -     Frequency  - Add 3 additional days to program exercise sessions.  -        Exercise Comments:     Exercise Comments    Row Name 02/03/17 1253 03/25/17  1302         Exercise Comments Patient has only attended three sessions. Will cont. to monitor and progress.  Home Exercise Prescription completed         Exercise Goals and Review:   Exercise Goals Re-Evaluation :     Exercise Goals Re-Evaluation    Row Name 03/03/17 1610 03/31/17 1643           Exercise Goal Re-Evaluation   Exercise Goals Review Increase Physical Activity;Increase Strenth and Stamina Increase Physical Activity;Increase Strenth and Stamina      Comments Patient is managing well with workload increases and motivating himself. Patient is walking 14 laps (283fperlap) in 15 minutes. Will cont. to monitor and progress as appropriate.  Patient's rate of percieved exertion is usually medium to hard. Walking an average of 14 laps (200 feet per lap) in 15 minutes. Will cont. to monitor and progress as able.       Expected Outcomes Through the exercise here at rehab the patient with increase physical capacity, strength, and stamina. Through the exercise here at rehab the patient with increase physical capacity, strength, and stamina.         Discharge Exercise Prescription (Final Exercise Prescription Changes):     Exercise Prescription Changes - 04/01/17 1200      Response to Exercise   Blood Pressure (Admit) 120/64   Blood Pressure (Exercise) 140/80   Blood Pressure (Exit) 98/62   Heart Rate (Admit) 83 bpm   Heart Rate (Exercise) 107 bpm   Heart Rate (Exit) 89 bpm   Oxygen Saturation (Admit) 93 %   Oxygen Saturation (Exercise) 91 %   Oxygen Saturation (Exit) 94 %   Rating of Perceived Exertion (Exercise) 13   Perceived Dyspnea (Exercise) 3   Duration Progress to 45 minutes of aerobic exercise without signs/symptoms of physical distress   Intensity THRR unchanged     Progression   Progression Continue  to progress workloads to maintain intensity without signs/symptoms of physical distress.     Resistance Training   Training Prescription Yes   Weight blue  bands   Reps 10-15   Time 10 Minutes     Interval Training   Interval Training No     Bike   Level 0.8   Minutes 17     NuStep   Level 5   Minutes 17   METs 2.5     Track   Laps 12   Minutes 17      Nutrition:  Target Goals: Understanding of nutrition guidelines, daily intake of sodium <1500mg, cholesterol <200mg, calories 30% from fat and 7% or less from saturated fats, daily to have 5 or more servings of fruits and vegetables.  Biometrics:     Pre Biometrics - 01/13/17 1251      Pre Biometrics   Grip Strength 22 kg       Nutrition Therapy Plan and Nutrition Goals:     Nutrition Therapy & Goals - 03/18/17 1219      Nutrition Therapy   Diet Therapeutic Lifestyle Changes     Personal Nutrition Goals   Nutrition Goal Maintain wt while in Pulmonary Rehab     Intervention Plan   Intervention Prescribe, educate and counsel regarding individualized specific dietary modifications aiming towards targeted core components such as weight, hypertension, lipid management, diabetes, heart failure and other comorbidities.   Expected Outcomes Short Term Goal: Understand basic principles of dietary content, such as calories, fat, sodium, cholesterol and nutrients.;Long Term Goal: Adherence to prescribed nutrition plan.      Nutrition Discharge: Rate Your Plate Scores:     Nutrition Assessments - 03/18/17 1219      Rate Your Plate Scores   Pre Score 42      Nutrition Goals Re-Evaluation:   Nutrition Goals Discharge (Final Nutrition Goals Re-Evaluation):   Psychosocial: Target Goals: Acknowledge presence or absence of significant depression and/or stress, maximize coping skills, provide positive support system. Participant is able to verbalize types and ability to use techniques and skills needed for reducing stress and depression.  Initial Review & Psychosocial Screening:     Initial Psych Review & Screening - 01/13/17 1254      Initial Review   Current  issues with --  none identified     Family Dynamics   Good Support System? Yes     Barriers   Psychosocial barriers to participate in program There are no identifiable barriers or psychosocial needs.     Screening Interventions   Interventions Encouraged to exercise      Quality of Life Scores:   PHQ-9: Recent Review Flowsheet Data    Depression screen PHQ 2/9 01/13/2017 12/05/2015   Decreased Interest 0 0   Down, Depressed, Hopeless 0 0   PHQ - 2 Score 0 0     Interpretation of Total Score  Total Score Depression Severity:  1-4 = Minimal depression, 5-9 = Mild depression, 10-14 = Moderate depression, 15-19 = Moderately severe depression, 20-27 = Severe depression   Psychosocial Evaluation and Intervention:     Psychosocial Evaluation - 01/13/17 1254      Psychosocial Evaluation & Interventions   Interventions Encouraged to exercise with the program and follow exercise prescription   Continue Psychosocial Services  No     Discharge Psychosocial Assessment & Intervention   Discharge Continue support measures as needed      Psychosocial Re-Evaluation:     Psychosocial Re-Evaluation      Row Name 02/03/17 1647 03/03/17 1106 04/01/17 0728         Psychosocial Re-Evaluation   Current issues with  -  - None Identified     Comments No barriers identified since admission No barriers identified since admission No barriers identified since admission     Interventions Encouraged to attend Pulmonary Rehabilitation for the exercise Encouraged to attend Pulmonary Rehabilitation for the exercise Encouraged to attend Pulmonary Rehabilitation for the exercise     Continue Psychosocial Services   - No Follow up required No Follow up required        Psychosocial Discharge (Final Psychosocial Re-Evaluation):     Psychosocial Re-Evaluation - 04/01/17 0728      Psychosocial Re-Evaluation   Current issues with None Identified   Comments No barriers identified since admission    Interventions Encouraged to attend Pulmonary Rehabilitation for the exercise   Continue Psychosocial Services  No Follow up required      Education: Education Goals: Education classes will be provided on a weekly basis, covering required topics. Participant will state understanding/return demonstration of topics presented.  Learning Barriers/Preferences:     Learning Barriers/Preferences - 01/13/17 1248      Learning Barriers/Preferences   Learning Barriers None   Learning Preferences Video;Written Material;Verbal Instruction;Skilled Demonstration;Pictoral;Individual Instruction;Group Instruction;Audio      Education Topics: Risk Factor Reduction:  -Group instruction that is supported by a PowerPoint presentation. Instructor discusses the definition of a risk factor, different risk factors for pulmonary disease, and how the heart and lungs work together.     PULMONARY REHAB OTHER RESPIRATORY from 03/20/2017 in [facility]  Date  03/13/17  Educator  EP  Instruction Review Code  2- meets goals/outcomes      Nutrition for Pulmonary Patient:  -Group instruction provided by PowerPoint slides, verbal discussion, and written materials to support subject matter. The instructor gives an explanation and review of healthy diet recommendations, which includes a discussion on weight management, recommendations for fruit and vegetable consumption, as well as protein, fluid, caffeine, fiber, sodium, sugar, and alcohol. Tips for eating when patients are short of breath are discussed.   PULMONARY REHAB OTHER RESPIRATORY from 03/20/2017 in [facility]  Date  03/06/17  Educator  RD  Instruction Review Code  2- meets goals/outcomes      Pursed Lip Breathing:  -Group instruction that is supported by demonstration and informational handouts. Instructor discusses the benefits of pursed lip and diaphragmatic breathing and detailed  demonstration on how to preform both.     Oxygen Safety:  -Group instruction provided by PowerPoint, verbal discussion, and written material to support subject matter. There is an overview of "What is Oxygen" and "Why do we need it".  Instructor also reviews how to create a safe environment for oxygen use, the importance of using oxygen as prescribed, and the risks of noncompliance. There is a brief discussion on traveling with oxygen and resources the patient may utilize.   Oxygen Equipment:  -Group instruction provided by Home Health Staff utilizing handouts, written materials, and equipment demonstrations.   Signs and Symptoms:  -Group instruction provided by written material and verbal discussion to support subject matter. Warning signs and symptoms of infection, stroke, and heart attack are reviewed and when to call the physician/911 reinforced. Tips for preventing the spread of infection discussed.   PULMONARY REHAB OTHER RESPIRATORY from 03/20/2017 in [facility]  Date  01/23/17  Educator    RN  Instruction Review Code  2- meets goals/outcomes      Advanced Directives:  -Group instruction provided by verbal instruction and written material to support subject matter. Instructor reviews Advanced Directive laws and proper instruction for filling out document.   Pulmonary Video:  -Group video education that reviews the importance of medication and oxygen compliance, exercise, good nutrition, pulmonary hygiene, and pursed lip and diaphragmatic breathing for the pulmonary patient.   PULMONARY REHAB OTHER RESPIRATORY from 03/20/2017 in [facility]  Date  02/13/17  Educator  staff  Instruction Review Code  2- meets goals/outcomes      Exercise for the Pulmonary Patient:  -Group instruction that is supported by a PowerPoint presentation. Instructor discusses benefits of exercise, core components of exercise, frequency,  duration, and intensity of an exercise routine, importance of utilizing pulse oximetry during exercise, safety while exercising, and options of places to exercise outside of rehab.     Pulmonary Medications:  -Verbally interactive group education provided by instructor with focus on inhaled medications and proper administration.   PULMONARY REHAB OTHER RESPIRATORY from 03/20/2017 in [facility]  Date  03/20/17  Educator  Dr. Ghimire  Instruction Review Code  2- meets goals/outcomes      Anatomy and Physiology of the Respiratory System and Intimacy:  -Group instruction provided by PowerPoint, verbal discussion, and written material to support subject matter. Instructor reviews respiratory cycle and anatomical components of the respiratory system and their functions. Instructor also reviews differences in obstructive and restrictive respiratory diseases with examples of each. Intimacy, Sex, and Sexuality differences are reviewed with a discussion on how relationships can change when diagnosed with pulmonary disease. Common sexual concerns are reviewed.   PULMONARY REHAB OTHER RESPIRATORY from 03/20/2017 in [facility]  Date  02/27/17  Educator    Instruction Review Code  2- meets goals/outcomes      Knowledge Questionnaire Score:   Core Components/Risk Factors/Patient Goals at Admission:     Personal Goals and Risk Factors at Admission - 01/13/17 1253      Core Components/Risk Factors/Patient Goals on Admission   Sedentary Yes   Increase Strength and Stamina Yes      Core Components/Risk Factors/Patient Goals Review:      Goals and Risk Factor Review    Row Name 01/13/17 1253 02/03/17 1650 03/03/17 1105 04/01/17 0727 04/01/17 0728     Core Components/Risk Factors/Patient Goals Review   Personal Goals Review Sedentary;Increase Strength and Stamina Sedentary;Increase Strength and Stamina Develop more efficient  breathing techniques such as purse lipped breathing and diaphragmatic breathing and practicing self-pacing with activity.;Improve shortness of breath with ADL's (P)  Develop more efficient breathing techniques such as purse lipped breathing and diaphragmatic breathing and practicing self-pacing with activity.;Improve shortness of breath with ADL's Develop more efficient breathing techniques such as purse lipped breathing and diaphragmatic breathing and practicing self-pacing with activity.;Improve shortness of breath with ADL's   Review  - see comments section on ITP see comments section on ITP (P)  see comments section on ITP see comments section on ITP   Expected Outcomes  - see Admission expected outcomes see Admission expected outcomes  - see Admission expected outcomes      Core Components/Risk Factors/Patient Goals at Discharge (Final Review):      Goals and Risk Factor Review - 04/01/17 0728      Core Components/Risk Factors/Patient Goals Review   Personal Goals Review Develop more   efficient breathing techniques such as purse lipped breathing and diaphragmatic breathing and practicing self-pacing with activity.;Improve shortness of breath with ADL's   Review see comments section on ITP   Expected Outcomes see Admission expected outcomes      ITP Comments:   Comments: ITP REVIEW Pt is making expected progress toward pulmonary rehab goals after completing 18 sessions. He was able to enjoy a recent beach trip to [place]. He is not as inactive as he was in the beginning of the program. He feels his stamina and strength has improved significantly. Recommend continued exercise, life style modification, education, and utilization of breathing techniques to increase stamina and strength and decrease shortness of breath with exertion.

## 2017-04-01 NOTE — Progress Notes (Signed)
Daily Session Note  Patient Details  Name: Tony Mcmillan MRN: [id number] Date of Birth: 22-Apr-1941 Referring Provider:     Pulmonary Rehab Walk Test from 01/16/2017 in [place]  Referring Provider  Dr. Chase Caller      Encounter Date: 04/01/2017  Check In:     Session Check In - 04/01/17 1030      Check-In   Location [facility]   Staff Present Rosebud Poles, RN, BSN;[name] diVincenzo, MS, ACSM RCEP, Exercise Physiologist;Lisa Ysidro Evert, RN;Portia Rollene Rotunda, RN, BSN   Supervising physician immediately available to respond to emergencies [facility] immediately available   Physician(s) Dr. Ree Kida   Medication changes reported     No   Fall or balance concerns reported    No   Tobacco Cessation No Change   Warm-up and Cool-down Performed as group-led instruction   Resistance Training Performed Yes   VAD Patient? No     Pain Assessment   Currently in Pain? No/denies   Multiple Pain Sites No      Capillary Blood Glucose: No results found for this or any previous visit (from the past 24 hour(s)).      Exercise Prescription Changes - 04/01/17 1200      Response to Exercise   Blood Pressure (Admit) 120/64   Blood Pressure (Exercise) 140/80   Blood Pressure (Exit) 98/62   Heart Rate (Admit) 83 bpm   Heart Rate (Exercise) 107 bpm   Heart Rate (Exit) 89 bpm   Oxygen Saturation (Admit) 93 %   Oxygen Saturation (Exercise) 91 %   Oxygen Saturation (Exit) 94 %   Rating of Perceived Exertion (Exercise) 13   Perceived Dyspnea (Exercise) 3   Duration Progress to 45 minutes of aerobic exercise without signs/symptoms of physical distress   Intensity THRR unchanged     Progression   Progression Continue to progress workloads to maintain intensity without signs/symptoms of physical distress.     Resistance Training   Training Prescription Yes   Weight blue bands   Reps 10-15   Time 10 Minutes     Interval Training   Interval  Training No     Bike   Level 0.8   Minutes 17     NuStep   Level 5   Minutes 17   METs 2.5     Track   Laps 12   Minutes 17      History  Smoking Status  . Former Smoker  . Packs/day: 1.00  . Years: 17.00  . Types: Cigarettes  . Quit date: 12/23/1993  Smokeless Tobacco  . Never Used    Goals Met:  Proper associated with RPD/PD & O2 Sat Independence with exercise equipment Improved SOB with ADL's Exercise tolerated well Strength training completed today  Goals Unmet:  Not Applicable  Comments: Service time is from 1030 to 1200    Dr. Rush Farmer is Medical Director for Pulmonary Rehab at [facility].

## 2017-04-02 ENCOUNTER — Telehealth: Payer: Self-pay | Admitting: Rheumatology

## 2017-04-02 NOTE — Telephone Encounter (Signed)
Patient called wanting to know if he could go ahead and take the Enbrel even though he has a cold.  CB#[phone].  Thank you.

## 2017-04-02 NOTE — Telephone Encounter (Signed)
Patient states he does not have a fever. Just a little congestion at night.Patient is taking Mucinex based on what his pulmonary rehab recommended. Patient is due for his Enbrel injection tomorrow and would like to know if he should Enbrel ? Patient was also under the impression that because he started Enbrel he was not going to be continuing the MTX. Patient advised that he would be continuing both MTX and Enbrel. Patient verbalized understanding and states he would contact the pharmacy.

## 2017-04-02 NOTE — Telephone Encounter (Signed)
ok 

## 2017-04-02 NOTE — Telephone Encounter (Signed)
I called Mr. Delo to follow up.  He confirms he received his Enbrel copay card in the mail.  He said he spoke to the pharmacy yesterday and he thought the medication was going to arrive last night.  He reports he has not received the medication.    I called [facility] and confirmed his medication is scheduled for delivery today.  I informed patient his medication is scheduled to be delivered today and advised him to call me if he does not receive the medication or if he has any questions or concerns regarding the medication.   Elisabeth Most, Pharm.D., BCPS, CPP Clinical Pharmacist Pager: [phone] Phone: [phone] 04/02/2017 10:35 AM

## 2017-04-03 ENCOUNTER — Encounter (HOSPITAL_COMMUNITY)
Admission: RE | Admit: 2017-04-03 | Discharge: 2017-04-03 | Disposition: A | Discharge status: Home / Self Care | Payer: Medicare Other | Source: Ambulatory Visit | Attending: Internal Medicine | Admitting: Internal Medicine

## 2017-04-03 ENCOUNTER — Telehealth: Payer: Self-pay | Admitting: Rheumatology

## 2017-04-03 DIAGNOSIS — J438 Other emphysema: Secondary | ICD-10-CM | POA: Diagnosis not present

## 2017-04-03 NOTE — Telephone Encounter (Signed)
Patient advised it is okay to proceed with Enbrel as scheduled.

## 2017-04-03 NOTE — Progress Notes (Signed)
Daily Session Note  Patient Details  Name: Tony Mcmillan MRN: [id number] Date of Birth: 1941/11/02 Referring Provider:     Pulmonary Rehab Walk Test from 01/16/2017 in [place]  Referring Provider  Dr. Chase Caller      Encounter Date: 04/03/2017  Check In:     Session Check In - 04/03/17 1051      Check-In   Location [facility]   Staff Present Rosebud Poles, RN, BSN; , MS, ACSM RCEP, Exercise Physiologist;Lisa Ysidro Evert, RN;Portia Rollene Rotunda, RN, BSN   Supervising physician immediately available to respond to emergencies [facility] immediately available   Physician(s) Dr. Cathlean Sauer   Medication changes reported     No   Fall or balance concerns reported    No   Tobacco Cessation No Change   Warm-up and Cool-down Performed as group-led instruction   Resistance Training Performed Yes   VAD Patient? No     Pain Assessment   Currently in Pain? No/denies   Multiple Pain Sites No      Capillary Blood Glucose: No results found for this or any previous visit (from the past 24 hour(s)).      Exercise Prescription Changes - 04/03/17 1200      Response to Exercise   Blood Pressure (Admit) 102/56   Blood Pressure (Exercise) 130/64   Blood Pressure (Exit) 100/52   Heart Rate (Admit) 80 bpm   Heart Rate (Exercise) 96 bpm   Heart Rate (Exit) 89 bpm   Oxygen Saturation (Admit) 94 %   Oxygen Saturation (Exercise) 93 %   Oxygen Saturation (Exit) 94 %   Rating of Perceived Exertion (Exercise) 13   Perceived Dyspnea (Exercise) 3   Duration Progress to 45 minutes of aerobic exercise without signs/symptoms of physical distress   Intensity THRR unchanged     Progression   Progression Continue to progress workloads to maintain intensity without signs/symptoms of physical distress.     Resistance Training   Training Prescription Yes   Weight blue bands   Reps 10-15   Time 10 Minutes     Interval Training   Interval  Training No     Bike   Level 0.8   Minutes 17     Track   Laps 14   Minutes 17      History  Smoking Status  . Former Smoker  . Packs/day: 1.00  . Years: 17.00  . Types: Cigarettes  . Quit date: 12/23/1993  Smokeless Tobacco  . Never Used    Goals Met:  Exercise tolerated well No report of cardiac concerns or symptoms Strength training completed today  Goals Unmet:  Not Applicable  Comments: Service time is from 10:30a to 12:05p    Dr. Rush Farmer is Medical Director for Pulmonary Rehab at [facility].

## 2017-04-03 NOTE — Telephone Encounter (Signed)
Patient has questions for Dr. Koleen Nimrod. Please call patient. He would not specify what the questions were in reference to.

## 2017-04-03 NOTE — Telephone Encounter (Signed)
Patient wanted to know if he should continue methotrexate or stop it.  I advised patient we want him to continue methotrexate with his Enbrel.  Patient voiced understanding.  He confirms he received his Enbrel from the pharmacy and took his second dose today as scheduled.  Patient denies any further questions or concerns at this time.    Elisabeth Most, Pharm.D., BCPS, CPP Clinical Pharmacist Pager: [phone] Phone: [phone] 04/03/2017 4:00 PM

## 2017-04-08 ENCOUNTER — Telehealth: Payer: Self-pay | Admitting: Rheumatology

## 2017-04-08 ENCOUNTER — Encounter (HOSPITAL_COMMUNITY)
Admission: RE | Admit: 2017-04-08 | Discharge: 2017-04-08 | Disposition: A | Discharge status: Home / Self Care | Payer: Medicare Other | Source: Ambulatory Visit | Attending: Internal Medicine | Admitting: Internal Medicine

## 2017-04-08 VITALS — Wt 150.4 lb

## 2017-04-08 DIAGNOSIS — J438 Other emphysema: Secondary | ICD-10-CM | POA: Diagnosis not present

## 2017-04-08 DIAGNOSIS — J439 Emphysema, unspecified: Secondary | ICD-10-CM

## 2017-04-08 NOTE — Telephone Encounter (Signed)
I returned patient's call.  He said he did not call me.  He denies any questions or concerns regarding his medications at this time.    Elisabeth Most, Pharm.D., BCPS, CPP Clinical Pharmacist Pager: [phone] Phone: [phone] 04/08/2017 9:13 AM

## 2017-04-08 NOTE — Telephone Encounter (Signed)
Patient left a message requesting Tony Mcmillan to call him.  CB#[phone].  Thank you

## 2017-04-08 NOTE — Progress Notes (Signed)
Daily Session Note  Patient Details  Name: Tony Mcmillan MRN: [id number] Date of Birth: [birth year] Referring Provider:     Pulmonary Rehab Walk Test from 01/16/2017 in [place]  Referring Provider  Dr. Chase Caller      Encounter Date: 04/08/2017  Check In:     Session Check In - 04/08/17 1030      Check-In   [facility]   Staff Present Rosebud Poles, RN, BSN;[name] diVincenzo, MS, ACSM RCEP, Exercise Physiologist;Lisa Ysidro Evert, RN;Portia Rollene Rotunda, RN, BSN   Supervising physician immediately available to respond to emergencies [facility] immediately available   Physician(s) Dr. Cathlean Sauer   Medication changes reported     No   Fall or balance concerns reported    No   Tobacco Cessation No Change   Warm-up and Cool-down Performed as group-led instruction   Resistance Training Performed Yes   VAD Patient? No     Pain Assessment   Currently in Pain? No/denies   Multiple Pain Sites No      Capillary Blood Glucose: No results found for this or any previous visit (from the past 24 hour(s)).      Exercise Prescription Changes - 04/08/17 1200      Response to Exercise   Blood Pressure (Admit) 118/56   Blood Pressure (Exercise) 140/80   Blood Pressure (Exit) 98/58   Heart Rate (Admit) 87 bpm   Heart Rate (Exercise) 128 bpm   Heart Rate (Exit) 78 bpm   Oxygen Saturation (Admit) 92 %   Oxygen Saturation (Exercise) 88 %   Oxygen Saturation (Exit) 95 %   Rating of Perceived Exertion (Exercise) 13   Perceived Dyspnea (Exercise) 3   Duration Progress to 45 minutes of aerobic exercise without signs/symptoms of physical distress   Intensity THRR unchanged     Progression   Progression Continue to progress workloads to maintain intensity without signs/symptoms of physical distress.     Resistance Training   Training Prescription Yes   Weight blue bands   Reps 10-15   Time 10 Minutes     Interval Training   Interval  Training No     Bike   Level 0.8   Minutes 17     NuStep   Level 5   Minutes 17   METs 2.1     Track   Laps 15   Minutes 17      History  Smoking Status  . Former Smoker  . Packs/day: 1.00  . Years: 17.00  . Types: Cigarettes  . Quit date: 12/23/1993  Smokeless Tobacco  . Never Used    Goals Met:  Proper associated with RPD/PD & O2 Sat Independence with exercise equipment Improved SOB with ADL's Exercise tolerated well Strength training completed today  Goals Unmet:  Not Applicable  Comments: Service time is from 1030 to 1210    Dr. Rush Farmer is Medical Director for Pulmonary Rehab at [facility].

## 2017-04-10 ENCOUNTER — Encounter (HOSPITAL_COMMUNITY)
Admission: RE | Admit: 2017-04-10 | Discharge: 2017-04-10 | Disposition: A | Discharge status: Home / Self Care | Payer: Medicare Other | Source: Ambulatory Visit | Attending: Internal Medicine | Admitting: Internal Medicine

## 2017-04-10 VITALS — Wt 151.7 lb

## 2017-04-10 DIAGNOSIS — J438 Other emphysema: Secondary | ICD-10-CM | POA: Diagnosis not present

## 2017-04-10 DIAGNOSIS — J439 Emphysema, unspecified: Secondary | ICD-10-CM

## 2017-04-10 NOTE — Progress Notes (Signed)
Daily Session Note  Patient Details  Name: Tony Mcmillan MRN: [id number] Date of Birth: 1941/03/11 Referring Provider:     Pulmonary Rehab Walk Test from 01/16/2017 in [place]  Referring Provider  Dr. Chase Caller      Encounter Date: 04/10/2017  Check In:     Session Check In - 04/10/17 1030      Check-In   Location [facility]   Staff Present Rosebud Poles, RN, BSN;[name] diVincenzo, MS, ACSM RCEP, Exercise Physiologist;Portia Rollene Rotunda, RN, BSN   Supervising physician immediately available to respond to emergencies [facility] immediately available   Physician(s) Dr. Sloan Leiter   Medication changes reported     No   Tobacco Cessation No Change   Warm-up and Cool-down Performed as group-led instruction   Resistance Training Performed Yes     Pain Assessment   Multiple Pain Sites No      Capillary Blood Glucose: No results found for this or any previous visit (from the past 24 hour(s)).      Exercise Prescription Changes - 04/10/17 1200      Response to Exercise   Blood Pressure (Admit) 106/58   Blood Pressure (Exercise) 132/80   Blood Pressure (Exit) 112/50   Heart Rate (Admit) 83 bpm   Heart Rate (Exercise) 109 bpm   Heart Rate (Exit) 84 bpm   Oxygen Saturation (Admit) 93 %   Oxygen Saturation (Exercise) 89 %   Oxygen Saturation (Exit) 93 %   Rating of Perceived Exertion (Exercise) 13   Perceived Dyspnea (Exercise) 1   Duration Progress to 45 minutes of aerobic exercise without signs/symptoms of physical distress   Intensity THRR unchanged     Progression   Progression Continue to progress workloads to maintain intensity without signs/symptoms of physical distress.     Resistance Training   Training Prescription Yes   Weight blue bands   Reps 10-15   Time 10 Minutes     Interval Training   Interval Training No     Bike   Level 0.8   Minutes 17     NuStep   Level 5   Minutes 17   METs 2.3       History  Smoking Status  . Former Smoker  . Packs/day: 1.00  . Years: 17.00  . Types: Cigarettes  . Quit date: 12/23/1993  Smokeless Tobacco  . Never Used    Goals Met:  Exercise tolerated well Strength training completed today  Goals Unmet:  Not Applicable  Comments: Service time is from 1030 to 1220   Dr. Rush Farmer is Medical Director for Pulmonary Rehab at [facility].

## 2017-04-15 ENCOUNTER — Encounter (HOSPITAL_COMMUNITY): Payer: Medicare Other

## 2017-04-15 ENCOUNTER — Telehealth (HOSPITAL_COMMUNITY): Payer: Self-pay | Admitting: Family Medicine

## 2017-04-17 ENCOUNTER — Encounter (HOSPITAL_COMMUNITY)
Admission: RE | Admit: 2017-04-17 | Discharge: 2017-04-17 | Disposition: A | Discharge status: Home / Self Care | Payer: Medicare Other | Source: Ambulatory Visit | Attending: Internal Medicine | Admitting: Internal Medicine

## 2017-04-17 VITALS — Wt 151.2 lb

## 2017-04-17 DIAGNOSIS — J438 Other emphysema: Secondary | ICD-10-CM | POA: Diagnosis not present

## 2017-04-17 DIAGNOSIS — J439 Emphysema, unspecified: Secondary | ICD-10-CM

## 2017-04-17 NOTE — Progress Notes (Signed)
Daily Session Note  Patient Details  Name: Tony Mcmillan MRN: [id number] Date of Birth: [birth year] Referring Provider:     Pulmonary Rehab Walk Test from 01/16/2017 in [place]  Referring Provider  Dr. Chase Caller      Encounter Date: 04/17/2017  Check In:     Session Check In - 04/17/17 1107      Check-In   Location [facility]   Staff Present Rosebud Poles, RN, BSN;Lisa Ysidro Evert, RN; , MS, ACSM RCEP, Exercise Physiologist;Portia Rollene Rotunda, RN, BSN   Supervising physician immediately available to respond to emergencies [facility] immediately available   Physician(s) Dr. Posey Pronto   Medication changes reported     No   Fall or balance concerns reported    No   Tobacco Cessation No Change   Warm-up and Cool-down Performed as group-led instruction   Resistance Training Performed Yes   VAD Patient? No     Pain Assessment   Currently in Pain? No/denies   Multiple Pain Sites No      Capillary Blood Glucose: No results found for this or any previous visit (from the past 24 hour(s)).      Exercise Prescription Changes - 04/17/17 1200      Response to Exercise   Blood Pressure (Admit) 98/56   Blood Pressure (Exercise) 142/70   Blood Pressure (Exit) 104/56   Heart Rate (Admit) 86 bpm   Heart Rate (Exercise) 106 bpm   Heart Rate (Exit) 76 bpm   Oxygen Saturation (Admit) 91 %   Oxygen Saturation (Exercise) 92 %   Oxygen Saturation (Exit) 92 %   Rating of Perceived Exertion (Exercise) 13   Perceived Dyspnea (Exercise) 3   Duration Progress to 45 minutes of aerobic exercise without signs/symptoms of physical distress   Intensity THRR unchanged     Progression   Progression Continue to progress workloads to maintain intensity without signs/symptoms of physical distress.     Resistance Training   Training Prescription Yes   Weight blue bands   Reps 10-15   Time 10 Minutes     Interval Training   Interval  Training No     NuStep   Level 5   Minutes 17   METs 2.3     Track   Laps 12   Minutes 17      History  Smoking Status  . Former Smoker  . Packs/day: 1.00  . Years: 17.00  . Types: Cigarettes  . Quit date: 12/23/1993  Smokeless Tobacco  . Never Used    Goals Met:  Exercise tolerated well No report of cardiac concerns or symptoms Strength training completed today  Goals Unmet:  Not Applicable  Comments: Service time is from 10:30a to 12:25p Patient attended education today with Dr. Nelda Marseille    Dr. Rush Farmer is Medical Director for Pulmonary Rehab at [facility].

## 2017-04-22 ENCOUNTER — Encounter (HOSPITAL_COMMUNITY)
Admission: RE | Admit: 2017-04-22 | Discharge: 2017-04-22 | Disposition: A | Discharge status: Home / Self Care | Payer: Medicare Other | Source: Ambulatory Visit | Attending: Internal Medicine | Admitting: Internal Medicine

## 2017-04-22 DIAGNOSIS — J439 Emphysema, unspecified: Secondary | ICD-10-CM

## 2017-04-22 DIAGNOSIS — J438 Other emphysema: Secondary | ICD-10-CM | POA: Diagnosis not present

## 2017-04-22 NOTE — Progress Notes (Signed)
Tony Mcmillan is a 76 y.o. male who presents for annual wellness visit and follow-up on chronic medical conditions.  He has the following concerns: He is feeling well and in good spirits.   Vitamin D supplement- he started it back 4 weeks ago.  It was elevated in December 2017 so he stopped the supplement at that point.   Wears a nasal cannula at night.  He is fasting and would like to have his cholesterol rechecked.   He has a history of BPH and see his urologist for this. Review of recent visit notes in February and he did not have PSA checked. He would like to have this checked.    Due for pneumonia vaccine.  Immunization History  Administered Date(s) Administered  . Influenza-Unspecified 10/30/2015, 09/13/2016  . Pneumococcal Conjugate-13 12/05/2015  . Pneumococcal Polysaccharide-23 04/23/2017  . Tdap 12/09/2015  . Zoster 12/09/2015   Last colonoscopy: 02/20/2016 Last PSA: 12/05/2015 Dentist: years. He has a partial.  Ophtho: in the past 3 months. New eyeglasses.  Exercise: 5 days per week - pulmonary rehab.   Other doctors caring for patient include: Rheumatologist: Dr. Estanislado Pandy  GI: Dr. Fuller Plan  Pulmonologist: Dr. Chase Caller  Cardiologist: has seen Angelena Form PA in past few months. Had stress test, echocardiogram.  Urologist: [facility]   Depression screen:  See questionnaire below.     Depression screen [facility] 2/9 04/23/2017 04/22/2017 01/13/2017 12/05/2015  Decreased Interest 0 0 0 0  Down, Depressed, Hopeless 0 0 0 0  PHQ - 2 Score 0 0 0 0    Fall Screen: See Questionaire below.   Fall Risk  04/23/2017 01/13/2017 12/05/2015  Falls in the past year? No No No  Risk for fall due to : - Impaired balance/gait -  Risk for fall due to (comments): - after neck surgery he uses a cane -    ADL screen:  See questionnaire below.  Functional Status Survey: Is the patient deaf or have difficulty hearing?: No Does the patient have difficulty seeing, even when wearing  glasses/contacts?: No Does the patient have difficulty concentrating, remembering, or making decisions?: No Does the patient have difficulty walking or climbing stairs?: No Does the patient have difficulty dressing or bathing?: No Does the patient have difficulty doing errands alone such as visiting a doctor's office or shopping?: No   End of Life Discussion:  Patient has a living will and medical power of attorney and will bring it in to be scanned.    Review of Systems  Constitutional: -fever, -chills, -sweats, -unexpected weight change, -anorexia, -fatigue Allergy: -sneezing, -itching, -congestion Dermatology: denies changing moles, rash, lumps, new worrisome lesions ENT: -runny nose, -ear pain, -sore throat, -hoarseness, -sinus pain, -teeth pain, + occasional tinnitus, -hearing loss, -epistaxis Cardiology:  -chest pain, -palpitations, -edema, -orthopnea, -paroxysmal nocturnal dyspnea Respiratory: -cough, -shortness of breath, -dyspnea on exertion, -wheezing, -hemoptysis Gastroenterology: -abdominal pain, -nausea, -vomiting, -diarrhea, -constipation, -blood in stool, -changes in bowel movement, -dysphagia Hematology: -bleeding or bruising problems Musculoskeletal: -arthralgias, -myalgias, -joint swelling, -back pain, -neck pain, -cramping, -gait changes Ophthalmology: -vision changes, -eye redness, -itching, -discharge Urology: -dysuria, -difficulty urinating, -hematuria, -urinary frequency, -urgency, incontinence Neurology: -headache, -weakness, -tingling, -numbness, -speech abnormality, -memory loss, -falls, -dizziness Psychology:  -depressed mood, -agitation, -sleep problems   PHYSICAL EXAM:  BP 104/64   Pulse 77   Ht 5' 2.3" (1.582 m)   Wt 150 lb (68 kg)   BMI 27.17 kg/m   General Appearance: Alert, cooperative, no distress, appears stated age Head: Normocephalic,  without obvious abnormality, atraumatic Eyes: PERRL, conjunctiva/corneas clear, EOM's intact, fundi  benign Ears: Normal TM's and external ear canals Nose: Nares normal, mucosa normal, no drainage or sinus   tenderness Throat: Lips, mucosa, and tongue normal; teeth and gums normal Neck: Supple, no lymphadenopathy, thyroid:no enlargement/tenderness/nodules; no carotid bruit or JVD Back: Spine nontender, no curvature, ROM normal, no CVA tenderness Lungs: Clear to auscultation bilaterally without wheezes, rales or ronchi; respirations unlabored Chest Wall: No tenderness or deformity Heart: Regular rate and rhythm, S1 and S2 normal, no murmur, rub or gallop Breast Exam: No chest wall tenderness, masses or gynecomastia Abdomen: Soft, non-tender, nondistended, normoactive bowel sounds, no masses, no hepatosplenomegaly Genitalia: done at Urologist 2 months ago. Rectal: done at urologist. Extremities: No clubbing, cyanosis or edema Pulses: 2+ and symmetric all extremities Skin: Skin color, texture, turgor normal, no rashes or lesions Lymph nodes: Cervical, supraclavicular, and axillary nodes normal Neurologic: CNII-XII intact, normal strength, sensation and gait; reflexes 2+ and symmetric throughout   Psych: Normal mood, affect, hygiene and grooming  ASSESSMENT/PLAN: Medicare annual wellness visit, subsequent  Vitamin D deficiency - Plan: VITAMIN D 25 Hydroxy (Vit-D Deficiency, Fractures)  Elevated LDL cholesterol level - Plan: Lipid panel  Medication management - Plan: VITAMIN D 25 Hydroxy (Vit-D Deficiency, Fractures)  Benign prostatic hyperplasia without lower urinary tract symptoms - Plan: PSA  Need for pneumococcal vaccination - Plan: Pneumococcal polysaccharide vaccine 23-valent greater than or equal to 2yo subcutaneous/IM  He is doing well. Appears to be getting stronger in regards to activity level and graduated from pulmonary rehab. Encouraged him to continue with daily exercise as was recommended by his pulmonologist.  Recommend he get a dental exam and cleaning, this is overdue.    Apparently he had a prostate exam at his urologist in February 2018 but did not have a PSA blood test. We will add this today. Last one was normal in 2016.   Encouraged him to bring in his advanced directives.    Discussed PSA screening (risks/benefits), recommended at least 30 minutes of aerobic activity at least 5 days/week; proper sunscreen use reviewed; healthy diet and alcohol recommendations (less than or equal to 2 drinks/day) reviewed; regular seatbelt use; changing batteries in smoke detectors. Immunization recommendations discussed.  Colonoscopy recommendations reviewed.   Medicare Attestation I have personally reviewed: The patient's medical and social history Their use of alcohol, tobacco or illicit drugs Their current medications and supplements The patient's functional ability including ADLs,fall risks, home safety risks, cognitive, and hearing and visual impairment Diet and physical activities Evidence for depression or mood disorders  The patient's weight, height, and BMI have been recorded in the chart.  I have made referrals, counseling, and provided education to the patient based on review of the above and I have provided the patient with a written personalized care plan for preventive services.     Harland Dingwall, NP-C   04/23/2017

## 2017-04-23 ENCOUNTER — Ambulatory Visit (INDEPENDENT_AMBULATORY_CARE_PROVIDER_SITE_OTHER): Payer: Medicare Other | Admitting: Family Medicine

## 2017-04-23 ENCOUNTER — Encounter: Payer: Self-pay | Admitting: Family Medicine

## 2017-04-23 VITALS — BP 104/64 | HR 77 | Ht 62.3 in | Wt 150.0 lb

## 2017-04-23 DIAGNOSIS — E7800 Pure hypercholesterolemia, unspecified: Secondary | ICD-10-CM

## 2017-04-23 DIAGNOSIS — Z79899 Other long term (current) drug therapy: Secondary | ICD-10-CM

## 2017-04-23 DIAGNOSIS — Z23 Encounter for immunization: Secondary | ICD-10-CM

## 2017-04-23 DIAGNOSIS — N400 Enlarged prostate without lower urinary tract symptoms: Secondary | ICD-10-CM | POA: Diagnosis not present

## 2017-04-23 DIAGNOSIS — Z0000 Encounter for general adult medical examination without abnormal findings: Secondary | ICD-10-CM

## 2017-04-23 DIAGNOSIS — E559 Vitamin D deficiency, unspecified: Secondary | ICD-10-CM | POA: Diagnosis not present

## 2017-04-23 LAB — LIPID PANEL
Cholesterol: 185 mg/dL (ref <200)
HDL: 50 mg/dL (ref >40)
LDL Cholesterol: 109 mg/dL — ABNORMAL HIGH (ref <100)
Total CHOL/HDL Ratio: 3.7 Ratio (ref <5.0)
Triglycerides: 130 mg/dL (ref <150)
VLDL: 26 mg/dL (ref <30)

## 2017-04-23 NOTE — Patient Instructions (Addendum)
MEDICARE PREVENTATIVE SERVICES (MALE) AND PERSONALIZED PLAN for  Tony Mcmillan Apr 23, 2017  CONDITIONS OR RISKS IDENTIFIED TODAY: It is important to take care of your oral health, this affects your overall health. Bring in your living will or any advance directives so we can make a copy and scan them into the chart.     SPECIFIC RECOMMENDATIONS: Call and schedule a dental exam.   Influenza vaccine: up to date Pneumococcal vaccine: Prevnar 13 in 2016. Pneumovax given today.  Shingles vaccine: 2016 Tdap vaccine: 2016 Colonoscopy: 02/20/2016  Take Aspirin 81mg  nightly for heart health Continue getting at least 150 minutes of exercise each week. Make sure you are eating a healthy diet.  We will call you with your lab results.   Return in 6 months.    GENERAL RECOMMENDATIONS FOR GOOD HEALTH:  Supplements:  . Take a daily baby Aspirin 81mg  at bedtime for heart health unless you have a history of gastrointestinal bleed, allergy to aspirin, or are already taking higher dose Aspirin or other antiplatelet or blood thinner medication.   . Consume 1200 mg of Calcium daily through dietary calcium or supplement if you are male age 17 or older, or men 75 and older.   Men aged 100-70 should consume 1000 mg of Calcium daily. . Take 600 IU of Vitamin D daily.  Take 800 IU of Calcium daily if you are older than age 5.  . Take a general multivitamin daily.   Healthy diet: Eat a variety of foods, including fruits, vegetables, vegetable protein such as beans, lentils, tofu, and grains, such as rice.  Limit meat or animal protein, but if you eat meat, choose leans cuts such as chicken, fish, or [place].  Drink plenty of water daily.  Decrease saturated fat in the diet, avoid lots of red meat, processed foods, sweets, fast foods, and fried foods.  Limit salt and caffeine intake.  Exercise: Aerobic exercise helps maintain good heart health. Weight bearing exercise helps keep bones and muscles working  strong.  We recommend at least 30-40 minutes of exercise most days of the week.   Fall prevention: Falls are the leading cause of injuries, accidents, and accidental deaths in people over the age of 15. Falling is a real threat to your ability to live on your own.  Causes include poor eyesight or poor hearing, illness, poor lighting, throw rugs, clutter in your home, and medication side effects causing dizziness or balance problems.  Such medications can include medications for depression, sleep problems, high blood pressure, diabetes, and heart conditions.   PREVENTION  Be sure your home is as safe as possible. Here are some tips:  Wear shoes with non-skid soles (not house slippers).   Be sure your home and outside area are well lit.   Use night lights throughout your house, including hallways and stairways.   Remove clutter and clean up spills on floors and walkways.   Remove throw rugs or fasten them to the floor with carpet tape. Tack down carpet edges.   Do not place electrical cords across pathways.   Install grab bars in your bathtub, shower, and toilet area. Towel bars should not be used as a grab bar.   Install handrails on both sides of stairways.   Do not climb on stools or stepladders. Get someone else to help with jobs that require climbing.   Do not wax your floors at all, or use a non-skid wax.   Repair uneven or unsafe sidewalks, walkways or  stairs.   Keep frequently used items within reach.   Be aware of pets so you do not trip.  Get regular check-ups from your doctor, and take good care of yourself:  Have your eyes checked every year for vision changes, cataracts, glaucoma, and other eye problems. Wear eyeglasses as directed.   Have your hearing checked every 2 years, or anytime you or others think that you cannot hear well. Use hearing aids as directed.   See your caregiver if you have foot pain or corns. Sore feet can contribute to falls.   Let your  caregiver know if a medicine is making you feel dizzy or making you lose your balance.   Use a cane, walker, or wheelchair as directed. Use walker or wheelchair brakes when getting in and out.   When you get up from bed, sit on the side of the bed for 1 to 2 minutes before you stand up. This will give your blood pressure time to adjust, and you will feel less dizzy.   If you need to go to the bathroom often, consider using a bedside commode.  Disease prevention:  If you smoke or chew tobacco, find out from your caregiver how to quit. It can literally save your life, no matter how long you have been a tobacco user. If you do not use tobacco, never begin. Medicare does cover some smoking cessation counseling.  Maintain a healthy diet and normal weight. Increased weight leads to problems with blood pressure and diabetes. We check your height, weight, and BMI as part of your yearly visit.  The Body Mass Index or BMI is a way of measuring how much of your body is fat. Having a BMI above 27 increases the risk of heart disease, diabetes, hypertension, stroke and other problems related to obesity. Your caregiver can help determine your BMI and based on it develop an exercise and dietary program to help you achieve or maintain this important measurement at a healthful level.  High blood pressure causes heart and blood vessel problems.  Persistent high blood pressure should be treated with medicine if weight loss and exercise do not work.  We check your blood pressure as part of your yearly visit.  Avoid drinking alcohol in excess (more than two drinks per day).  Avoid use of street drugs. Do not share needles with anyone. Ask for professional help if you need assistance or instructions on stopping the use of alcohol, cigarettes, and/or drugs.  Brush your teeth twice a day with fluoride toothpaste, and floss once a day. Good oral hygiene prevents tooth decay and gum disease. The problems can be painful,  unattractive, and can cause other health problems. Visit your dentist for a routine oral and dental checkup and preventive care every 6-12 months.   See your eye doctor yearly for routine screening for things like glaucoma.  Look at your skin regularly.  Use a mirror to look at your back. Notify your caregivers of changes in moles, especially if there are changes in shapes, colors, a size larger than a pencil eraser, an irregular border, or development of new moles.  Safety:  Use seatbelts 100% of the time, whether driving or as a passenger.  Use safety devices such as hearing protection if you work in environments with loud noise or significant background noise.  Use safety glasses when doing any work that could send debris in to the eyes.  Use a helmet if you ride a bike or motorcycle.  Use  appropriate safety gear for contact sports.  Talk to your caregiver about gun safety.  Use sunscreen with a SPF (or skin protection factor) of 15 or greater.  Lighter skinned people are at a greater risk of skin cancer. Don't forget to also wear sunglasses in order to protect your eyes from too much damaging sunlight. Damaging sunlight can accelerate cataract formation.   If you have multiple sexual partners, or if you are not in a monogamous relationship, practice safe sex. Use condoms. Condoms are used to help reduce the spread of sexually transmitted infections (or STIs).  Consider an HIV test if you have never been tested.  Consider routine screening for STIs if you have multiple sexual partners.   Keep carbon monoxide and smoke detectors in your home functioning at all times. Change the batteries every 6 months or use a model that plugs into the wall or is hard wired in.   END OF LIFE PLANNING/ADVANCED DIRECTIVES Advance health-care planning is deciding the kind of care you want at the end of life. While alert competent adults are able to exercise their rights to make health care and financial decisions,  problems arise when an individual becomes unconscious, incapacitated, or otherwise unable to communicate or make such decisions. Advance health care directives are the legal documents in which you give written instructions about your choices limited, aggressive or palliative care if, in the future, you cannot speak for yourself.  Advanced directives include the following: [place] allows you to appoint someone to act as your health care agent to make health care decisions for you should it be determined by your health care provider that you are no longer able to make these decisions for yourself.  A Living Will is a legal document in which you can declare that under certain conditions you desire your life not be prolonged by extraordinary or artificial means during your last illness or when you are near death. We can provide you with sample advanced directives, you can get an attorney to prepare these for you, or you can visit [place] Secretary of State's website for additional information and resources at [email or web address]  Further, I recommend you have an attorney prepare a Will and Durable Power of Attorney if you haven't done so already.  Please get [place] a copy of your health care Advanced Directives.   PREVENTATIV E CARE RECOMMENDATIONS:  Vaccinations: We recommend the following vaccinations as part of your preventative care:  Pneumococcal vaccine is recommended to protect against certain types of pneumonia.  This is normally recommended for adults age 49 or older once, or up to every 5 years for those at high risk.  The vaccine is also recommended for adults younger than 75 years old with certain underlying conditions that make them high risk for pneumonia.  Influenza vaccine is recommended to protect against seasonal influenza or "the flu." Influenza is a serious disease that can lead to hospitalization and sometimes even death. Traditional flu vaccines  (called trivalent vaccines) are made to protect against three flu viruses; an influenza A (H1N1) virus, an influenza A (H3N2) virus, and an influenza B virus. In addition, there are flu vaccines made to protect against four flu viruses (called "quadrivalent" vaccines). These vaccines protect against the same viruses as the trivalent vaccine and an additional B virus.  We recommend the high dose influenza vaccine to those 65 years and older.  Hepatitis B vaccine to protect against a form of infection of the liver  by a virus acquired from blood or body fluids, particularly for high risk groups.  Td or Tdap vaccine to protect against Tetanus, diphtheria and pertussis which can be very serious.  These diseases are caused by bacteria.  Diphtheria and pertussis are spread from person to person through coughing or sneezing.  Tetanus enters the body through cuts, scratches, or wounds.  Tetanus (Lockjaw) causes painful muscle tightening and stiffness, usually all over the body.  Diphtheria can cause a thick coating to form in the back of the throat.  It can lead to breathing problems, paralysis, heart failure, and death.  Pertussis (Whooping Cough) causes severe coughing spells, which can cause difficulty breathing, vomiting and disturbed sleep.  Td or Tdap is usually given every 10 years.  Shingles vaccine to protect against Varicella Zoster if you are older than age 85, or younger than 75 years old with certain underlying illness.    Cancer Screening: Most routine colon cancer screening begins at the age of 91.  Subsequent colonoscopies are performed either every 5-10 years for normal screening, or every 2-5 years for higher risks patients, up until age 61 years of age. Annual screening is done with easy to use take-home tests to check for hidden blood in the stool called hemoccult tests.  Sigmoidoscopy or colonoscopy can detect the earliest forms of colon cancer and is life saving. These tests use a small camera  at the end of a tube to directly examine the colon.   Prostate cancer screening usually begins at age 50 years old, or younger age for those with higher risk.  Those at higher risk include African-Americans or having a family history of prostate cancer. There are two types of tests for prostate cancer - Prostate-specific antigen (PSA) testing. Recent studies raise questions about prostate cancer using PSA and you should discuss this with your caregiver.  The other type of test is the digital rectal exam (in which your doctor's lubricated and gloved finger feels for enlargement of the prostate through the anus).  We routinely stop testing at age 58 years of age.  Osteoporosis Screening: Screening for osteoporosis usually begins at age 59 for women, and can be done as frequent as every 2 years.  However, women or men with higher risk of osteoporosis may be screened earlier than age 53.  Osteoporosis or low bone mass is diminished bone strength from alterations in bone architecture leading to bone fragility and increased fracture risk.     Cardiovascular Screening: Fat and cholesterol leaves deposits in your arteries that can block them. This causes heart disease and vessel disease elsewhere in your body.  If your cholesterol is found to be high, or if you have heart disease or certain other medical conditions, then you may need to have your cholesterol monitored frequently and be treated with medication. Cardiovascular screening in the form of lab tests for cholesterol, HDL and triglycerides can be done every 5 years.  A screening electrocardiogram can be done as part of the Welcome to Medicare physical.  Diabetes Screening: Diabetes screening can be done at least every 3 years for those with risk factors,  or every 6-28months for prediabetic patients.  Screening includes fasting blood sugar test or glucose tolerance test.  Risk factors include hypertension, dyslipidemia, obesity, previously abnormal  glucose tests, family history of diabetes, age 66 years or older, and history of gestations diabetes.   AAA (abdominal aortic aneurysm) Screening: Medicare allows for a one time ultrasound to screen for abdominal aortic  aneurysm if done as a referral as part of the Welcome to Medicare exam.  Men eligible for this screening include those men between age 68-37 years of age who have smoked at least 100 cigarettes in his lifetime and/or has a family history of AAA.  HIV Screening:  Medicare allows for yearly screening for patients at high risk for contracting HIV disease.

## 2017-04-24 ENCOUNTER — Encounter (HOSPITAL_COMMUNITY): Payer: Medicare Other

## 2017-04-24 LAB — PSA: PSA: 1.1 ng/mL (ref <=4.0)

## 2017-04-24 LAB — VITAMIN D 25 HYDROXY (VIT D DEFICIENCY, FRACTURES): Vit D, 25-Hydroxy: 55 ng/mL (ref 30–100)

## 2017-04-29 ENCOUNTER — Encounter (HOSPITAL_COMMUNITY): Payer: Medicare Other

## 2017-04-30 ENCOUNTER — Encounter (HOSPITAL_COMMUNITY): Payer: Self-pay | Admitting: *Deleted

## 2017-05-01 ENCOUNTER — Other Ambulatory Visit: Payer: Self-pay

## 2017-05-01 ENCOUNTER — Telehealth: Payer: Self-pay | Admitting: Pharmacist

## 2017-05-01 ENCOUNTER — Encounter (HOSPITAL_COMMUNITY): Payer: Medicare Other

## 2017-05-01 DIAGNOSIS — Z79899 Other long term (current) drug therapy: Secondary | ICD-10-CM

## 2017-05-01 DIAGNOSIS — Z79631 Long term (current) use of antimetabolite agent: Secondary | ICD-10-CM

## 2017-05-01 LAB — CBC WITH DIFFERENTIAL/PLATELET
Basophils Absolute: 59 cells/uL (ref 0–200)
Basophils Relative: 1 %
Eosinophils Absolute: 59 cells/uL (ref 15–500)
Eosinophils Relative: 1 %
HCT: 42 % (ref 38.5–50.0)
Hemoglobin: 14.1 g/dL (ref 13.2–17.1)
Lymphocytes Relative: 16 %
Lymphs Abs: 944 cells/uL (ref 850–3900)
MCH: 29.1 pg (ref 27.0–33.0)
MCHC: 33.6 g/dL (ref 32.0–36.0)
MCV: 86.6 fL (ref 80.0–100.0)
MPV: 9.5 fL (ref 7.5–12.5)
Monocytes Absolute: 531 cells/uL (ref 200–950)
Monocytes Relative: 9 %
Neutro Abs: 4307 cells/uL (ref 1500–7800)
Neutrophils Relative %: 73 %
Platelets: 226 10*3/uL (ref 140–400)
RBC: 4.85 MIL/uL (ref 4.20–5.80)
RDW: 17.3 % — ABNORMAL HIGH (ref 11.0–15.0)
WBC: 5.9 10*3/uL (ref 3.8–10.8)

## 2017-05-01 NOTE — Telephone Encounter (Signed)
I spoke to Mr. Somerville today while he was in the office.  He reported he needed a refill of methotrexate.  I reviewed his chart and noted that a 90 day supply (10 mL) was sent in for him on 03/26/17.  Patient does confirm he is using the vial for multiple doses as instructed.  I asked patient if he received 5 vials (2 mL/vial) when he last filled the prescription.  He reports he only received 2 vials.  I informed him that he should still have refill remaining at his pharmacy.  Advised him to call us if he does not have refill remaining at the pharmacy.  Patient voiced understanding.     Elisabeth Most, Pharm.D., BCPS, CPP Clinical Pharmacist Pager: [phone] Phone: [phone] 05/01/2017 11:57 AM

## 2017-05-02 LAB — COMPLETE METABOLIC PANEL WITH GFR
ALT: 20 U/L (ref 9–46)
AST: 25 U/L (ref 10–35)
Albumin: 3.8 g/dL (ref 3.6–5.1)
Alkaline Phosphatase: 51 U/L (ref 40–115)
BUN: 16 mg/dL (ref 7–25)
CO2: 23 mmol/L (ref 20–31)
Calcium: 9.4 mg/dL (ref 8.6–10.3)
Chloride: 103 mmol/L (ref 98–110)
Creat: 1.25 mg/dL — ABNORMAL HIGH (ref 0.70–1.18)
GFR, Est African American: 65 mL/min (ref >=60)
GFR, Est Non African American: 56 mL/min — ABNORMAL LOW (ref >=60)
Glucose, Bld: 120 mg/dL — ABNORMAL HIGH (ref 65–99)
Potassium: 4.8 mmol/L (ref 3.5–5.3)
Sodium: 139 mmol/L (ref 135–146)
Total Bilirubin: 0.9 mg/dL (ref 0.2–1.2)
Total Protein: 7 g/dL (ref 6.1–8.1)

## 2017-05-02 NOTE — Progress Notes (Signed)
Reduce MTX to 0.4 ml sq qweek

## 2017-05-06 ENCOUNTER — Encounter (HOSPITAL_COMMUNITY): Payer: Medicare Other

## 2017-05-07 ENCOUNTER — Telehealth: Payer: Self-pay | Admitting: Rheumatology

## 2017-05-07 NOTE — Telephone Encounter (Signed)
Patient advised of lab results and advised to decrease MTX to 0.4 mL weekly. Patient verbalized understanding.

## 2017-05-07 NOTE — Telephone Encounter (Signed)
Patient had lab work due last week. Requesting result. Please call patient.

## 2017-05-12 ENCOUNTER — Telehealth: Payer: Self-pay | Admitting: Rheumatology

## 2017-05-12 DIAGNOSIS — H1012 Acute atopic conjunctivitis, left eye: Secondary | ICD-10-CM | POA: Diagnosis not present

## 2017-05-12 NOTE — Telephone Encounter (Signed)
Patient advised to go see his eye doctor. Patient advise to call and let us know what the eye doctor says. Patient verbalized understanding.

## 2017-05-12 NOTE — Telephone Encounter (Signed)
Patient calling concerned about his lt eye. Patient states for three days now his eye is red, running and irritated. Patient taking Enbrel, and MTX, Please call patient to advise. Patient request a call ASAP so he call go to eye doctor if medicine is not causing this.

## 2017-05-20 DIAGNOSIS — H1012 Acute atopic conjunctivitis, left eye: Secondary | ICD-10-CM | POA: Diagnosis not present

## 2017-05-20 DIAGNOSIS — H04123 Dry eye syndrome of bilateral lacrimal glands: Secondary | ICD-10-CM | POA: Diagnosis not present

## 2017-05-23 NOTE — Progress Notes (Signed)
Office Visit Note  Patient: Tony Mcmillan             Date of Birth: 1941/04/24           MRN: [id number]             PCP: Girtha Rm, NP-C Referring: Girtha Rm, NP-C Visit Date: 05/28/2017 Occupation: @GUAROCC @    Subjective:  Pain hands.   History of Present Illness: Tony Mcmillan is a 76 y.o. male with seronegative rheumatoid arthritis. He's been on combination of Enbrel and methotrexate. He's been on Enbrel for 2 months now and did not notice much improvement in his symptoms. We had to reduce his methotrexate dose from 2.8 ML to 0.4 mL due to drop in his GFR. He continues to have pain and swelling in his hands and discomfort in his knee joints and feet. He also complains of discomfort in her shoulders and elbows. He does not have much discomfort in his neck and lower back currently.  Activities of Daily Living:  Patient reports morning stiffness for 15 minutes.   Patient Reports nocturnal pain.  Difficulty dressing/grooming: Denies Difficulty climbing stairs: Denies Difficulty getting out of chair: Reports Difficulty using hands for taps, buttons, cutlery, and/or writing: Reports   Review of Systems  Constitutional: Positive for fatigue. Negative for night sweats and weakness ( ).  HENT: Negative for mouth sores, mouth dryness and nose dryness.   Eyes: Positive for dryness. Negative for redness.  Respiratory: Negative for shortness of breath and difficulty breathing.   Cardiovascular: Negative for chest pain, palpitations, hypertension, irregular heartbeat and swelling in legs/feet.  Gastrointestinal: Negative for constipation and diarrhea.  Endocrine: Negative for increased urination.  Musculoskeletal: Positive for arthralgias, joint pain, joint swelling and morning stiffness. Negative for myalgias, muscle weakness, muscle tenderness and myalgias.  Skin: Negative for color change, rash, hair loss, nodules/bumps, skin tightness, ulcers and sensitivity to sunlight.    Allergic/Immunologic: Negative for susceptible to infections.  Neurological: Negative for dizziness, fainting, memory loss and night sweats.  Hematological: Negative for swollen glands.  Psychiatric/Behavioral: Positive for sleep disturbance. Negative for depressed mood. The patient is not nervous/anxious.     PMFS History:  Patient Active Problem List   Diagnosis Date Noted  . Feeling of chest tightness, unclear cause, ? due to RA 02/21/2017  . Emphysema/COPD ([place]) 12/18/2016  . Dyspnea and respiratory abnormality 11/25/2016  . 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[place]) 11/04/2016  . High risk medication use 11/04/2016  . 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 Vitamin D deficiency 11/04/2016  . 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 History of anemia 07/02/2016  . History of colonic polyps 07/02/2016  . History of gastric ulcer 07/02/2016  . Asymptomatic microscopic hematuria 04/03/2016  . Benign prostatic hyperplasia 04/03/2016  . Renal cyst, left 04/03/2016  . 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 DDD (degenerative disc disease), lumbar 04/03/2016  . Colonic polyp 02/23/2016  . Duodenal ulcer 02/23/2016    Past Medical History:  Diagnosis Date  . Anemia   . Atypical chest pain    a. 09/2016 MV: EF 63%, normal perfusion.  . Blood transfusion without reported diagnosis   . Diastolic dysfunction    a. 2015 Echo: EF >55%;  b. 09/2016 Echo: EF 55-60%, no rwma, Gr1 DD, triv AI/MR, mild TR, PASP 58mmHg.  . Emphysema of lung ([place]) 02/08/2016  . GERD (gastroesophageal reflux disease)   . Rheumatoid arthritis ([place])     Family History  Problem Relation Age of Onset  . Hypertension Mother   . Hypertension Father   . Diabetes Father   . Hyperlipidemia Sister   . Diabetes Sister   . Stroke Brother   . Diabetes Brother   . Colon polyps Neg Hx   . Esophageal cancer Neg Hx   . Stomach cancer Neg Hx   . Rectal cancer Neg Hx   . Colon cancer Neg Hx     Past Surgical History:  Procedure Laterality Date  . ANTERIOR CERVICAL CORPECTOMY    . ANTERIOR CERVICAL CORPECTOMY  12/2014   for infection. this was in [place]  . CATARACT EXTRACTION     both eyes  . COLOSTOMY REVERSAL     had infection on buttocks and had to have a skin graft- had colostomy to help area stay clean and heal  . HERNIA REPAIR    . SPINE SURGERY     Social History   Social History Narrative   Retired. Lives alone.      Objective: Vital Signs: BP 126/70   Pulse 72   Resp 14   Wt 150 lb (68 kg)   BMI 27.17 kg/m    Physical Exam  Constitutional: He is oriented to person, place, and time. He appears well-developed and well-nourished.  HENT:  Head: Normocephalic and atraumatic.  Eyes: Conjunctivae and EOM are normal. Pupils are equal, round, and reactive to light.  Neck: Normal range of motion. Neck supple.  Cardiovascular: Normal rate, regular rhythm and normal heart sounds.   Pulmonary/Chest: Effort normal and breath sounds normal.  Abdominal: Soft. Bowel sounds are normal.  Neurological: He is alert and oriented to person, place, and time.  Skin: Skin is warm and dry. Capillary refill takes less than 2 seconds.  Psychiatric: He has a normal mood and affect. His behavior is normal.  Nursing note and vitals reviewed.    Musculoskeletal Exam: C-spine and thoracic spine. He had painful range of motion of bilateral shoulders with abduction limited to 90 bilaterally. He did not have any synovitis or elbow joints. He has synovitis over bilateral wrist joints with extensor tenosynovitis and limited range of motion. His tenderness and swelling over bilateral MCP joint. He is a described below. Some discomfort range of motion of right hip joint knee joints ankles and his range is  CDAI Exam: CDAI Homunculus Exam:   Tenderness:  RUE: glenohumeral and wrist LUE: glenohumeral and wrist Right hand: 2nd MCP and 3rd MCP Left hand: 2nd MCP and 3rd MCP RLE:  acetabulofemoral  Swelling:  RUE: wrist LUE: wrist Right hand: 2nd MCP Left hand: 2nd MCP  Joint Counts:  CDAI Tender Joint count: 8 CDAI Swollen Joint count: 4  Global Assessments:  Patient Global Assessment: 8 Provider Global Assessment: 8  CDAI Calculated Score: 28    Investigation: No additional findings. CBC Latest Ref Rng & Units 05/01/2017 03/26/2017 02/25/2017  WBC 3.8 - 10.8 K/uL 5.9 5.5 5.1  Hemoglobin 13.2 - 17.1 g/dL 14.1 13.5 13.6  Hematocrit 38.5 - 50.0 % 42.0 39.8 40.4  Platelets 140 - 400 K/uL 226 257 279    CMP Latest Ref Rng & Units 05/01/2017 03/26/2017 02/25/2017  Glucose 65 - 99 mg/dL 120(H) 83 86  BUN 7 - 25 mg/dL 16 18 11   Creatinine 0.70 - 1.18 mg/dL 1.25(H) 1.07 1.02  Sodium 135 - 146 mmol/L 139 137 136  Potassium 3.5 - 5.3 mmol/L 4.8 4.1 4.2  Chloride 98 - 110 mmol/L 103 106 103  CO2  20 - 31 mmol/L 23 22 26   Calcium 8.6 - 10.3 mg/dL 9.4 9.0 9.2  Total Protein 6.1 - 8.1 g/dL 7.0 6.7 7.1  Total Bilirubin 0.2 - 1.2 mg/dL 0.9 1.2 0.7  Alkaline Phos 40 - 115 U/L 51 69 68  AST 10 - 35 U/L 25 23 23   ALT 9 - 46 U/L 20 28 20     Imaging: No results found.  Speciality Comments: No specialty comments available.    Procedures:  No procedures performed Allergies: Patient has no known allergies.   Assessment / Plan:     Visit Diagnoses: Rheumatoid arthritis of multiple sites with negative rheumatoid factor (HCC)Continues to have severe arthritis with synovitis involving multiple joints. He didn't inadequate response to methotrexate and Enbrel combination. Defined treatment options and side effects were discussed today. After reviewing different medications and their side effects and wanted to proceed with Morrie Sheldon. His next Enbrel injection is due tomorrow. I will advised to discontinue Enbrel. He will take its first dose of substance tomorrow 5 mg by mouth twice a day. Sample was given. We will also apply for Morrie Sheldon and once approved. We'll be able to start  on prescription. He will need labs in 2 weeks to months and then every 2 months to monitor for drug toxicity. If he does really well SINCE WE MAY BE ABLE TO DISCONTINUE METHOTREXATE IN FUTURE.  High risk medication use - Enbrel 50 mg subcutaneous every week ,Methotrexate 0.4 ML subcutaneous every week, folic acid 1 mg 1 tablet by mouth daily, creatinine 1.25 05/01/2017 - Plan: BASIC METABOLIC PANEL WITH GFR today.  DJD (degenerative joint disease), cervical: Chronic pain  DDD (degenerative disc disease), lumbar: Chronic pain  Vitamin D deficiency  History of gastric ulcer  History of colonic polyps  History of anemia  Chronic pain of left wrist  Asymptomatic microscopic hematuria  History of COPD  History of BPH  Elevated serum creatinine - Plan: BASIC METABOLIC PANEL WITH GFR    Orders: Orders Placed This Encounter  Procedures  . BASIC METABOLIC PANEL WITH GFR  . Lipid panel   Meds ordered this encounter  Medications  . Tofacitinib Citrate (XELJANZ) 5 MG TABS    Sig: Take 5 mg by mouth 2 (two) times daily.    Dispense:  60 tablet    Refill:  1    Face-to-face time spent with patient was 19minutes. 50% of time was spent in counseling and coordination of care.  Follow-Up Instructions: Return for Rheumatoid arthritis.   Bo Merino, MD  Note - This record has been created using Editor, commissioning.  Chart creation errors have been sought, but may not always  have been located. Such creation errors do not reflect on  the standard of medical care.

## 2017-05-28 ENCOUNTER — Ambulatory Visit (INDEPENDENT_AMBULATORY_CARE_PROVIDER_SITE_OTHER): Payer: Medicare Other | Admitting: Rheumatology

## 2017-05-28 ENCOUNTER — Encounter (INDEPENDENT_AMBULATORY_CARE_PROVIDER_SITE_OTHER): Payer: Self-pay

## 2017-05-28 ENCOUNTER — Encounter: Payer: Self-pay | Admitting: Rheumatology

## 2017-05-28 VITALS — BP 126/70 | HR 72 | Resp 14 | Wt 150.0 lb

## 2017-05-28 DIAGNOSIS — Z8709 Personal history of other diseases of the respiratory system: Secondary | ICD-10-CM | POA: Diagnosis not present

## 2017-05-28 DIAGNOSIS — Z87438 Personal history of other diseases of male genital organs: Secondary | ICD-10-CM | POA: Diagnosis not present

## 2017-05-28 DIAGNOSIS — Z862 Personal history of diseases of the blood and blood-forming organs and certain disorders involving the immune mechanism: Secondary | ICD-10-CM | POA: Diagnosis not present

## 2017-05-28 DIAGNOSIS — E559 Vitamin D deficiency, unspecified: Secondary | ICD-10-CM | POA: Diagnosis not present

## 2017-05-28 DIAGNOSIS — M5136 Other intervertebral disc degeneration, lumbar region: Secondary | ICD-10-CM

## 2017-05-28 DIAGNOSIS — Z8719 Personal history of other diseases of the digestive system: Secondary | ICD-10-CM

## 2017-05-28 DIAGNOSIS — Z86010 Personal history of colonic polyps: Secondary | ICD-10-CM

## 2017-05-28 DIAGNOSIS — M5030 Other cervical disc degeneration, unspecified cervical region: Secondary | ICD-10-CM

## 2017-05-28 DIAGNOSIS — R7989 Other specified abnormal findings of blood chemistry: Secondary | ICD-10-CM

## 2017-05-28 DIAGNOSIS — M0609 Rheumatoid arthritis without rheumatoid factor, multiple sites: Secondary | ICD-10-CM

## 2017-05-28 DIAGNOSIS — G8929 Other chronic pain: Secondary | ICD-10-CM

## 2017-05-28 DIAGNOSIS — M25532 Pain in left wrist: Secondary | ICD-10-CM

## 2017-05-28 DIAGNOSIS — Z79899 Other long term (current) drug therapy: Secondary | ICD-10-CM | POA: Diagnosis not present

## 2017-05-28 DIAGNOSIS — R3121 Asymptomatic microscopic hematuria: Secondary | ICD-10-CM

## 2017-05-28 DIAGNOSIS — M47812 Spondylosis without myelopathy or radiculopathy, cervical region: Secondary | ICD-10-CM

## 2017-05-28 DIAGNOSIS — Z8711 Personal history of peptic ulcer disease: Secondary | ICD-10-CM

## 2017-05-28 LAB — BASIC METABOLIC PANEL WITH GFR
BUN: 13 mg/dL (ref 7–25)
CO2: 23 mmol/L (ref 20–31)
Calcium: 9.2 mg/dL (ref 8.6–10.3)
Chloride: 102 mmol/L (ref 98–110)
Creat: 1.23 mg/dL — ABNORMAL HIGH (ref 0.70–1.18)
GFR, Est African American: 66 mL/min (ref >=60)
GFR, Est Non African American: 57 mL/min — ABNORMAL LOW (ref >=60)
Glucose, Bld: 85 mg/dL (ref 65–99)
Potassium: 4.7 mmol/L (ref 3.5–5.3)
Sodium: 136 mmol/L (ref 135–146)

## 2017-05-28 MED ORDER — TOFACITINIB CITRATE 5 MG PO TABS
5.0000 mg | ORAL_TABLET | Freq: Two times a day (BID) | ORAL | 1 refills | Status: DC
Start: 2017-05-28 — End: 2017-07-14

## 2017-05-28 NOTE — Progress Notes (Signed)
Pharmacy Note  Subjective: [name] is a pleasant 76 yo M who presents to clinic for follow up on his sero-negative rheumatoid arthritis.  Patient is currently taking Enbrel 50 mg weekly, methotrexate 0.4 mL weekly, and folic acid 2 mg daily.  Patient was started on Enbrel on 03/27/17.  He reports he does not notice any improvement on the medication.  Patient had labs on 05/01/17 and methotrexate dose was reduced due to his kidney function.  Decision was made to change patient to [place].  Patient seen by the pharmacist for counseling on Xeljanz.    Objective: TB test: negative (08/14/16) Hepatitis: negative (08/14/16) Lipid Panel     Component Value Date/Time   CHOL 185 04/23/2017 1044   TRIG 130 04/23/2017 1044   HDL 50 04/23/2017 1044   CHOLHDL 3.7 04/23/2017 1044   VLDL 26 04/23/2017 1044   LDLCALC 109 (H) 04/23/2017 1044   CMP     Component Value Date/Time   NA 139 05/01/2017 1215   K 4.8 05/01/2017 1215   CL 103 05/01/2017 1215   CO2 23 05/01/2017 1215   GLUCOSE 120 (H) 05/01/2017 1215   BUN 16 05/01/2017 1215   CREATININE 1.25 (H) 05/01/2017 1215   CALCIUM 9.4 05/01/2017 1215   PROT 7.0 05/01/2017 1215   ALBUMIN 3.8 05/01/2017 1215   AST 25 05/01/2017 1215   ALT 20 05/01/2017 1215   ALKPHOS 51 05/01/2017 1215   BILITOT 0.9 05/01/2017 1215   GFRNONAA 56 (L) 05/01/2017 1215   GFRAA 65 05/01/2017 1215   CBC    Component Value Date/Time   WBC 5.9 05/01/2017 1215   RBC 4.85 05/01/2017 1215   HGB 14.1 05/01/2017 1215   HCT 42.0 05/01/2017 1215   PLT 226 05/01/2017 1215   MCV 86.6 05/01/2017 1215   MCH 29.1 05/01/2017 1215   MCHC 33.6 05/01/2017 1215   RDW 17.3 (H) 05/01/2017 1215   LYMPHSABS 944 05/01/2017 1215   MONOABS 531 05/01/2017 1215   EOSABS 59 05/01/2017 1215   BASOSABS 59 05/01/2017 1215    Assessment/Plan: Patient is aware that Morrie Sheldon will replace Enbrel.  He took Mcmillan recent Enbrel last Thursday.  Advised patient to discontinue Enbrel and start  Morrie Sheldon tomorrow.  Counseled patient that Morrie Sheldon is a JAK inhibitor indicated for Rheumatoid Arthritis.  Counseled patient on purpose, proper use, and adverse effects of Xeljanz.  Counseled patient that medication is prescribed to take 5 mg twice a day.  Reviewed the Mcmillan common adverse effects including infection, diarrhea, headaches.  Also reviewed rare adverse effects such as bowel injury and the need to contact us if he develops stomach pain during treatment.  Reviewed with patient that there is the possibility of an increased risk of malignancy but it is not well understood if this increased risk is due to the medication or the disease state.  Provided patient with medication education material and answered all questions.  Patient consented to [place].  Prior authorization was submitted for Morrie Sheldon and medication was approved through 08/26/17.  I provided patient with Morrie Sheldon copay card to assist with Morrie Sheldon cost.  Patient was also provided with Morrie Sheldon sample.  Drug name: Morrie Sheldon; Strength: 5 mg; Qty: 60 tablets; LOT: [id number]; Exp.Date: 12/2018; Dosing instructions: Take 1 tablet by mouth twice daily.  The patient has been instructed regarding the correct time, dose, and frequency of taking this medication, including desired effects and Mcmillan common side effects.   Tony Mcmillan, Pharm.D., BCPS, CPP Clinical Pharmacist Pager: [phone] Phone:  [phone] 05/28/2017 12:32 PM

## 2017-05-28 NOTE — Patient Instructions (Addendum)
Standing Labs We placed an order today for your standing lab work.    Please come back and get your standing labs in 2 weeks, 2 months then every 2 months.  You will need a lipid panel two months after starting this medication.    We have open lab Monday through Friday from 8:30-11:30 AM and 1:30-4 PM at the office of Dr. Tresa Moore, PA.   The office is located at [address] [place], [place], [place] [address] No appointment is necessary.   Labs are drawn by [facility].  You may receive a bill from [facility] for your lab work. If you have any questions regarding directions or hours of operation,  please call [phone].     Tofacitinib tablets What is this medicine? TOFACITINIB (TOE fa SYE ti nib) is a medicine that works on the immune system. This medicine is used to treat rheumatoid arthritis and psoriatic arthritis. This medicine may be used for other purposes; ask your health care provider or pharmacist if you have questions. COMMON BRAND NAME(S): Morrie Sheldon What should I tell my health care provider before I take this medicine? They need to know if you have any of these conditions: -cancer -diabetes -high cholesterol -immune system problems -infection (especially a virus infection such as chickenpox, cold sores, or herpes) -kidney disease -liver disease -low blood counts, like low white cell, platelet, or red cell counts -stomach or intestine problems -an unusual or allergic reaction to tofacitinib, other medicines, foods, dyes, or preservatives -pregnant or trying to get pregnant -breast-feeding How should I use this medicine? Take this medicine by mouth with a glass of water. Follow the directions on the prescription label. You can take it with or without food. If it upsets your stomach, take it with food. A special MedGuide will be given to you by the pharmacist with each prescription and refill. Be sure to read this information carefully each  time. Talk to your pediatrician regarding the use of this medicine in children. Special care may be needed. Overdosage: If you think you have taken too much of this medicine contact a poison control center or emergency room at once. NOTE: This medicine is only for you. Do not share this medicine with others. What if I miss a dose? If you miss a dose, take it as soon as you can. If it is almost time for your next dose, take only that dose. Do not take double or extra doses. What may interact with this medicine? Do not take this medicine with any of the following medications: -azathioprine, cyclosporine, or other immunosuppressive drugs -biologic medicines for arthritis such as abatacept, adalimumab, anakinra, certolizumab, etanercept, golimumab, infliximab, rituximab, secukinumab, tocilizumab, ustekinumab -live vaccines This medicine may also interact with the following medications: -antiviral medicines for hepatitis, HIV or AIDS -certain medicines for fungal infections like fluconazole, itraconazole, ketoconazole, voriconazole -certain medicines for seizures like carbamazepine, phenobarbital, phenytoin -rifampin -supplements, such as St. John's wort This list may not describe all possible interactions. Give your health care provider a list of all the medicines, herbs, non-prescription drugs, or dietary supplements you use. Also tell them if you smoke, drink alcohol, or use illegal drugs. Some items may interact with your medicine. What should I watch for while using this medicine? Tell your doctor or healthcare professional if your symptoms do not start to get better or if they get worse. Avoid taking products that contain aspirin, acetaminophen, ibuprofen, naproxen, or ketoprofen unless instructed by your doctor. These medicines may hide  a fever. Call your doctor or health care professional for advice if you get a fever, chills or sore throat, or other symptoms of a cold or flu. Do not treat  yourself. This drug decreases your body's ability to fight infections. Try to avoid being around people who are sick. What side effects may I notice from receiving this medicine? Side effects that you should report to your doctor or health care professional as soon as possible: -allergic reactions like skin rash, itching or hives, swelling of the face, lips, or tongue -breathing problems -dizziness -signs of infection - fever or chills, cough, sore throat, pain or trouble passing urine -signs and symptoms of liver injury like dark yellow or brown urine; general ill feeling or flu-like symptoms; light-colored stools; loss of appetite; nausea; right upper belly pain; unusually weak or tired; yellowing of the eyes or skin -stomach pain or a sudden change in bowel habits -unusually weak or tired Side effects that usually do not require medical attention (report to your doctor or health care professional if they continue or are bothersome): -diarrhea -headache -muscle aches -runny nose -sinus trouble This list may not describe all possible side effects. Call your doctor for medical advice about side effects. You may report side effects to FDA at 1-800-FDA-1088. Where should I keep my medicine? Keep out of the reach of children. Store between 20 and 25 degrees C (68 and 77 degrees F). Throw away any unused medicine after the expiration date. NOTE: This sheet is a summary. It may not cover all possible information. If you have questions about this medicine, talk to your doctor, pharmacist, or health care provider.  2018 Elsevier/Gold Standard (2016-12-27 11:43:53)

## 2017-05-29 ENCOUNTER — Telehealth (INDEPENDENT_AMBULATORY_CARE_PROVIDER_SITE_OTHER): Payer: Self-pay

## 2017-05-29 NOTE — Telephone Encounter (Signed)
Ena Dawley with [facility] is needing clarification on Rx for [place].  Invoice # is [id number].  CB# is [phone].  Please Advise.

## 2017-05-29 NOTE — Progress Notes (Signed)
Stable

## 2017-05-30 ENCOUNTER — Telehealth: Payer: Self-pay | Admitting: Rheumatology

## 2017-05-30 ENCOUNTER — Telehealth: Payer: Self-pay | Admitting: Radiology

## 2017-05-30 NOTE — Telephone Encounter (Signed)
I have called patient to advise labs are stable  

## 2017-05-30 NOTE — Telephone Encounter (Signed)
Ena Dawley from [facility] called needing to get clarification on the patient's [facility] prescription.  Invoice #[id number].  CB#[phone].  Thank you.

## 2017-06-02 ENCOUNTER — Encounter (HOSPITAL_COMMUNITY): Payer: Self-pay

## 2017-06-02 DIAGNOSIS — J439 Emphysema, unspecified: Secondary | ICD-10-CM

## 2017-06-02 NOTE — Progress Notes (Signed)
Discharge Summary  Patient Details  Name: Tony Mcmillan MRN: [id number] Date of Birth: [birth year] Referring Provider:     Pulmonary Rehab Walk Test from 01/16/2017 in [facility]  Referring Provider  Dr. Chase Caller       Number of Visits: 22  Reason for Discharge:  Patient reached a stable level of exercise. Patient independent in their exercise.  Smoking History:  History  Smoking Status  . Former Smoker  . Packs/day: 1.00  . Years: 17.00  . Types: Cigarettes  . Quit date: 12/23/1993  Smokeless Tobacco  . Never Used    Diagnosis:  Pulmonary emphysema, unspecified emphysema type ([place])  ADL UCSD:     Pulmonary Assessment Scores    Row Name 04/30/17 1526         ADL UCSD   ADL Phase Exit     SOB Score total 50        Initial Exercise Prescription:   Discharge Exercise Prescription (Final Exercise Prescription Changes):     Exercise Prescription Changes - 04/17/17 1200      Response to Exercise   Blood Pressure (Admit) 98/56   Blood Pressure (Exercise) 142/70   Blood Pressure (Exit) 104/56   Heart Rate (Admit) 86 bpm   Heart Rate (Exercise) 106 bpm   Heart Rate (Exit) 76 bpm   Oxygen Saturation (Admit) 91 %   Oxygen Saturation (Exercise) 92 %   Oxygen Saturation (Exit) 92 %   Rating of Perceived Exertion (Exercise) 13   Perceived Dyspnea (Exercise) 3   Duration Progress to 45 minutes of aerobic exercise without signs/symptoms of physical distress   Intensity THRR unchanged     Progression   Progression Continue to progress workloads to maintain intensity without signs/symptoms of physical distress.     Resistance Training   Training Prescription Yes   Weight blue bands   Reps 10-15   Time 10 Minutes     Interval Training   Interval Training No     NuStep   Level 5   Minutes 17   METs 2.3     Track   Laps 12   Minutes 17      Functional Capacity:     6 Minute Walk    Row Name 04/22/17 1257          6 Minute Walk   Phase Discharge     Distance 1270 feet     Walk Time 6 minutes     # of Rest Breaks 0     MPH 2.4     METS 2.84     RPE 12     Perceived Dyspnea  1     Symptoms No     Resting HR 89 bpm     Resting BP 118/66     Max Ex. HR 126 bpm     Max Ex. BP 150/80       Interval HR   Baseline HR 89     1 Minute HR 117     2 Minute HR 122     3 Minute HR 126     4 Minute HR 124     5 Minute HR 126     6 Minute HR 109     2 Minute Post HR 109     Interval Heart Rate? Yes       Interval Oxygen   Interval Oxygen? Yes     Baseline Oxygen Saturation % 93 %  Baseline Liters of Oxygen 0 L     1 Minute Oxygen Saturation % 97 %     1 Minute Liters of Oxygen 0 L     2 Minute Oxygen Saturation % 95 %     2 Minute Liters of Oxygen 0 L     3 Minute Oxygen Saturation % 96 %     3 Minute Liters of Oxygen 0 L     4 Minute Oxygen Saturation % 95 %     4 Minute Liters of Oxygen 0 L     5 Minute Oxygen Saturation % 94 %     5 Minute Liters of Oxygen 0 L     6 Minute Oxygen Saturation % 94 %     6 Minute Liters of Oxygen 0 L     2 Minute Post Oxygen Saturation % 94 %     2 Minute Post Liters of Oxygen 0 L        Psychological, QOL, Others - Outcomes: PHQ 2/9: Depression screen [facility] 2/9 04/23/2017 04/22/2017 01/13/2017 12/05/2015  Decreased Interest 0 0 0 0  Down, Depressed, Hopeless 0 0 0 0  PHQ - 2 Score 0 0 0 0    Quality of Life:     Quality of Life - 04/30/17 1526      Quality of Life Scores   Health/Function Post 24.5 %   Socioeconomic Post 26 %   Psych/Spiritual Post 26.8 %   Family Post 14.33 %   GLOBAL Post 24.33 %      Personal Goals: Goals established at orientation with interventions provided to work toward goal.    Personal Goals Discharge:     Goals and Risk Factor Review    Row Name 06/02/17 1054             Core Components/Risk Factors/Patient Goals Review   Review patient met all of his goals and core components at discharge           Nutrition & Weight - Outcomes:    Nutrition:   Nutrition Discharge:     Nutrition Assessments - 05/01/17 1341      Rate Your Plate Scores   Pre Score 42   Post Score --  Pt returned Rate Your Plate incomplete;      Education Questionnaire Score:     Knowledge Questionnaire Score - 04/30/17 1526      Knowledge Questionnaire Score   Post Score 8/13      Goals reviewed with patient; copy given to patient.

## 2017-06-02 NOTE — Telephone Encounter (Signed)
[facility] needed clarification as to Whether or not Tony Mcmillan had discontinued Enbrel. Advised them he is no longer taking Enbrel as we are starting him on [place]. Advise that he has been counseled on this matter.

## 2017-06-02 NOTE — Telephone Encounter (Signed)
See previous phone note.  

## 2017-06-04 ENCOUNTER — Telehealth: Payer: Self-pay

## 2017-06-04 NOTE — Telephone Encounter (Signed)
I have called patient to advise.  

## 2017-06-04 NOTE — Telephone Encounter (Signed)
Patient would like a call back concerning lab results. CB# is [phone].

## 2017-06-11 ENCOUNTER — Other Ambulatory Visit: Payer: Self-pay

## 2017-06-11 DIAGNOSIS — Z79631 Long term (current) use of antimetabolite agent: Secondary | ICD-10-CM

## 2017-06-11 DIAGNOSIS — Z79899 Other long term (current) drug therapy: Secondary | ICD-10-CM | POA: Diagnosis not present

## 2017-06-11 LAB — CBC WITH DIFFERENTIAL/PLATELET
Basophils Absolute: 58 cells/uL (ref 0–200)
Basophils Relative: 1 %
Eosinophils Absolute: 116 cells/uL (ref 15–500)
Eosinophils Relative: 2 %
HCT: 41.5 % (ref 38.5–50.0)
Hemoglobin: 14 g/dL (ref 13.2–17.1)
Lymphocytes Relative: 14 %
Lymphs Abs: 812 cells/uL — ABNORMAL LOW (ref 850–3900)
MCH: 28.9 pg (ref 27.0–33.0)
MCHC: 33.7 g/dL (ref 32.0–36.0)
MCV: 85.7 fL (ref 80.0–100.0)
MPV: 9.4 fL (ref 7.5–12.5)
Monocytes Absolute: 696 cells/uL (ref 200–950)
Monocytes Relative: 12 %
Neutro Abs: 4118 cells/uL (ref 1500–7800)
Neutrophils Relative %: 71 %
Platelets: 253 10*3/uL (ref 140–400)
RBC: 4.84 MIL/uL (ref 4.20–5.80)
RDW: 16.5 % — ABNORMAL HIGH (ref 11.0–15.0)
WBC: 5.8 10*3/uL (ref 3.8–10.8)

## 2017-06-12 LAB — COMPLETE METABOLIC PANEL WITH GFR
ALT: 16 U/L (ref 9–46)
AST: 19 U/L (ref 10–35)
Albumin: 4.1 g/dL (ref 3.6–5.1)
Alkaline Phosphatase: 58 U/L (ref 40–115)
BUN: 16 mg/dL (ref 7–25)
CO2: 22 mmol/L (ref 20–31)
Calcium: 9.5 mg/dL (ref 8.6–10.3)
Chloride: 105 mmol/L (ref 98–110)
Creat: 1.23 mg/dL — ABNORMAL HIGH (ref 0.70–1.18)
GFR, Est African American: 66 mL/min (ref >=60)
GFR, Est Non African American: 57 mL/min — ABNORMAL LOW (ref >=60)
Glucose, Bld: 81 mg/dL (ref 65–99)
Potassium: 4.5 mmol/L (ref 3.5–5.3)
Sodium: 139 mmol/L (ref 135–146)
Total Bilirubin: 1.1 mg/dL (ref 0.2–1.2)
Total Protein: 7.3 g/dL (ref 6.1–8.1)

## 2017-06-12 NOTE — Progress Notes (Signed)
Labs stable

## 2017-06-16 ENCOUNTER — Other Ambulatory Visit: Payer: Self-pay | Admitting: *Deleted

## 2017-06-16 ENCOUNTER — Telehealth: Payer: Self-pay | Admitting: Pharmacist

## 2017-06-16 MED ORDER — METHOTREXATE SODIUM CHEMO INJECTION 50 MG/2ML: 10.0000 mg | INTRAMUSCULAR | 0 refills | Status: DC

## 2017-06-16 NOTE — Telephone Encounter (Signed)
Refill request received via fax  Last Visit: 05/28/17 Next Visit: 07/29/17 Labs: 06/11/17 Stable  Okay to refill MTX per Dr. Estanislado Pandy.

## 2017-06-16 NOTE — Telephone Encounter (Signed)
Received fax from patient's insurance regarding "Adverse Drug Interction: Morrie Sheldon and Enbrel."  Enbrel was filled 05/22/17 and Morrie Sheldon was prescribed 05/28/17.    Noted patient was last seen on 05/28/17 and Enbrel was discontinued and Morrie Sheldon was started.  I called patient who confirms he is now taking Morrie Sheldon and is no longer taking Enbrel.    Patient asked about his labs that were drawn 06/11/17.  I informed him those were stable.   Elisabeth Most, Pharm.D., BCPS, CPP Clinical Pharmacist Pager: [phone] Phone: [phone] 06/16/2017 11:37 AM

## 2017-06-30 ENCOUNTER — Ambulatory Visit: Payer: Medicare Other | Admitting: Internal Medicine

## 2017-07-01 ENCOUNTER — Ambulatory Visit (INDEPENDENT_AMBULATORY_CARE_PROVIDER_SITE_OTHER): Payer: Medicare Other | Admitting: Internal Medicine

## 2017-07-01 ENCOUNTER — Encounter: Payer: Self-pay | Admitting: Internal Medicine

## 2017-07-01 VITALS — BP 126/72 | HR 106 | Ht 62.5 in | Wt 149.8 lb

## 2017-07-01 DIAGNOSIS — R0789 Other chest pain: Secondary | ICD-10-CM | POA: Diagnosis not present

## 2017-07-01 DIAGNOSIS — J439 Emphysema, unspecified: Secondary | ICD-10-CM

## 2017-07-01 NOTE — Patient Instructions (Addendum)
Feeling of chest tightness, unclear cause, ? due to RA  -   - Suspect this may be due to costochondritis otherwise called rib joint inflammation and may be due to rheumatoid arthritis. Please talk to rheumatologist her primary care physician about this because I'm unable to associate this with a pulmonary reason   Pulmonary emphysema, unspecified emphysema type ([place]) - Lung disease is stable. - Continue bevespi 2 puff 2 times daily scheduled - Flu shot in the fall -Please talk to PCP Tony Mcmillan, Tony L, NP-C -  and ensure you get  shingarix vaccine  Follow-up 6-9 months or sooner if needed

## 2017-07-01 NOTE — Progress Notes (Signed)
Subjective:     Patient ID: Tony Mcmillan, male   DOB: 12-24-1940, 76 y.o.   MRN: [id number]  HPI   Tony Mcmillan is a 76 y.o. male former smoker seen for pulmonary consult for dyspnea by Dr. Chase Mcmillan. 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 Tony Mcmillan 02/21/2017  Chief Complaint  Patient presents with  . Follow-up    Pt states his breathing is unchanged. Pt c/o right sided chest tightness that is persistent  - since 11/2016. Pt c/o prod cough with clear mucus.     76 year old African-American male with RA on methotrexate. Has dyspnea on exertion. Workup over the last few months as showing emphysema with isolated reduction in diffusion capacity. He's been started on Spiriva and nocturnal oxygen. He returns for follow-up after institution of these therapies. He tells me that he's not sure if these therapies have helped. He thinks it might have helped. However he is reporting new onset in the last month or 2 but definitely postdating starting of the Spiriva therapy is constant chest tightness on the right breast area. There is no clear cut aggravating factor or relieving factor. It is not a pain but it as a chest tightness. This no radiation. It is mild to moderate but definitely annoying. This no fever or chills or wheezing is definitely seeking relief for this.     03/24/2017 Follow Up Appointment: Pt. Presents today for follow up. He was seen by Dr. Chase Mcmillan 02/25/2017. He had been switched from Spiriva to [facility] for continued chest tightness.  Pt. States he does not really notice much of a change. He still is experiencing  some very mild chest tightness every now and again.There is no patten to the chest tightness.He states it is not bad chest tightness and that it self resolves.He was seen by cardiology 01/2017 normal ECG, and previously normal ischemic evaluation. They signed off for follow up as needed. He is agreeable to continuing on the [place] as he has notes a slight improvement. We did not start  a rescue inhaler as there was no BD response on PFT. He denies any chest pain, fever, orthopnea or hemoptysis. Minimal secretions with cough.He has added calcium and Vitamin D3 as treatment to his medication regimen while on methotrexate. He states he is complaint with his nocturnal oxygen.Saturations today are 94% on RA today.He is compliant with his Pulmonary rehab, and feels he is benefiting from treatment.  Tests 2-D echo October 2017 showed grade 1 diastolic dysfunction. Cardiac stress test negative in October 2017 Walk test in the office December 2017 did not show any type of desaturation. Chest x-ray October 2017 showed clear lungs High resolution CT chest 12/03/2016 showed no ILD, mild emphysema, mild diffuse bronchial wall thickening, focal area of scarring in the right upper lobe ono 11/29/16 shows pulse ox </= 88% at 46 min. Start 1L Mariano Colon  Pulmonary function test done 11/26/2016 showed an FEV1 at 74%, ratio 82, FVC 65%, no bronchodilator response, ERV 138%, DLCO 53%  OV 07/01/2017  Chief Complaint  Patient presents with  . Follow-up    Pt states he has right sided chest tightness that is constant. Pt denies significant cough, f/c/s, and SOB.    Follow-up rheumatoid arthritis patient with emphysema and associated right-sided chest tightness  This is a routine follow-up. Last summer nurse partition April 2018. He is on dual inhaler therapy scheduled long-acting beta agonist and anticholinergic may by [employer]. This is working well for him. He is stable  in terms of dyspnea. In fact is no dyspnea. He is attending pulmonary rehabilitation and has no problems. His main issues that he still continues to have moderate amount of right-sided chest tightness between the right breast and the right sternal area. The circular area around 5 or 7 cm in size. The constant feeling of tightness. Is moderate in severity. Present for several months without any progression. No radiation. No diaphoresis. He  is frustrated by it. The inhalers are not helping.    has a past medical history of Anemia; Atypical chest pain; Blood transfusion without reported diagnosis; Diastolic dysfunction; Emphysema of lung ([facility]) (02/08/2016); GERD (gastroesophageal reflux disease); and Rheumatoid arthritis ([place]).   reports that he quit smoking about 23 years ago. His smoking use included Cigarettes. He has a 17.00 pack-year smoking history. He has never used smokeless tobacco.  Past Surgical History:  Procedure Laterality Date  . ANTERIOR CERVICAL CORPECTOMY    . ANTERIOR CERVICAL CORPECTOMY  12/2014   for infection. this was in [place]  . CATARACT EXTRACTION     both eyes  . COLOSTOMY REVERSAL     had infection on buttocks and had to have a skin graft- had colostomy to help area stay clean and heal  . HERNIA REPAIR    . SPINE SURGERY      No Known Allergies  Immunization History  Administered Date(s) Administered  . Influenza-Unspecified 10/30/2015, 09/13/2016  . Pneumococcal Conjugate-13 12/05/2015  . Pneumococcal Polysaccharide-23 04/23/2017  . Tdap 12/09/2015  . Zoster 12/09/2015    Family History  Problem Relation Age of Onset  . Hypertension Mother   . Hypertension Father   . Diabetes Father   . Hyperlipidemia Sister   . Diabetes Sister   . Stroke Brother   . Diabetes Brother   . Colon polyps Neg Hx   . Esophageal cancer Neg Hx   . Stomach cancer Neg Hx   . Rectal cancer Neg Hx   . Colon cancer Neg Hx      Current Outpatient Prescriptions:  .  Calcium Carb-Cholecalciferol (CALCIUM-VITAMIN D3) 600-400 MG-UNIT TABS, Take by mouth., Disp: , Rfl:  .  fluticasone (FLONASE) 50 MCG/ACT nasal spray, Place 1 spray into both nostrils daily as needed., Disp: , Rfl:  .  folic acid (FOLVITE) 1 MG tablet, Take 2 mg by mouth daily., Disp: , Rfl:  .  Glycopyrrolate-Formoterol (BEVESPI AEROSPHERE) 9-4.8 MCG/ACT AERO, Inhale 2 puffs into the lungs 2 (two) times daily., Disp: 1 Inhaler, Rfl: 3 .   methotrexate 50 MG/2ML injection, Inject 0.4 mLs (10 mg total) into the skin once a week. Take 0.4 mL weekly, Disp: 6 mL, Rfl: 0 .  Tofacitinib Citrate (XELJANZ) 5 MG TABS, Take 5 mg by mouth 2 (two) times daily., Disp: 60 tablet, Rfl: 1 .  Tuberculin-Allergy Syringes 27G X 1/2" 1 ML KIT, Inject 1 Syringe into the skin once a week. To be used with weekly methotrexate injections., Disp: 12 each, Rfl: 3    Review of Systems     Objective:   Physical Exam  Constitutional: He is oriented to person, place, and time. He appears well-developed and well-nourished. No distress.  [place] pant[place] paraphernalia  HENT:  Head: Normocephalic and atraumatic.  Right Ear: External ear normal.  Left Ear: External ear normal.  Mouth/Throat: Oropharynx is clear and moist. No oropharyngeal exudate.  Eyes: Conjunctivae and EOM are normal. Pupils are equal, round, and reactive to light. Right eye exhibits no discharge. Left eye exhibits no discharge. No  scleral icterus.  Neck: Normal range of motion. Neck supple. No JVD present. No tracheal deviation present. No thyromegaly present.  Cardiovascular: Normal rate, regular rhythm and intact distal pulses.  Exam reveals no gallop and no friction rub.   No murmur heard. Pulmonary/Chest: Effort normal and breath sounds normal. No respiratory distress. He has no wheezes. He has no rales. He exhibits no tenderness.  Abdominal: Soft. Bowel sounds are normal. He exhibits no distension and no mass. There is no tenderness. There is no rebound and no guarding.  Musculoskeletal: Normal range of motion. He exhibits no edema or tenderness.  Lymphadenopathy:    He has no cervical adenopathy.  Neurological: He is alert and oriented to person, place, and time. He has normal reflexes. No cranial nerve deficit. Coordination normal.  Skin: Skin is warm and dry. No rash noted. He is not diaphoretic. No erythema. No pallor.  Psychiatric: He has a normal mood and affect. His behavior  is normal. Judgment and thought content normal.  Nursing note and vitals reviewed.  Vitals:   07/01/17 1144  BP: 126/72  Pulse: (!) 106  SpO2: 93%  Weight: 149 lb 12.8 oz (67.9 kg)  Height: 5' 2.5" (1.588 m)    Body mass index is 26.96 kg/m.     Assessment:       ICD-10-CM   1. Feeling of chest tightness, unclear cause, ? due to RA R07.89   2. Pulmonary emphysema, unspecified emphysema type ([place]) J43.9        Plan:     Feeling of chest tightness, unclear cause, ? due to RA  -   - Suspect this may be due to costochondritis otherwise called rib joint inflammation and may be due to rheumatoid arthritis. Please talk to rheumatologist her primary care physician about this because I'm unable to associate this with a pulmonary reason   Pulmonary emphysema, unspecified emphysema type ([place]) - Lung disease is stable. - Continue bevespi 2 puff 2 times daily scheduled - Flu shot in the fall -Please talk to PCP Raenette Rover, Vickie L, NP-C -  and ensure you get  shingarix vaccine  Follow-up 6-9 months or sooner if needed   Dr. Brand Males, M.D., [facility].C.P Pulmonary and Critical Care Medicine Staff Physician [place] [facility] Critical [facility] Pager: [phone], If no answer or between  15:00h - 7:00h: call [phone]  07/01/2017 12:43 PM

## 2017-07-02 ENCOUNTER — Ambulatory Visit: Payer: Medicare Other | Admitting: Internal Medicine

## 2017-07-11 ENCOUNTER — Other Ambulatory Visit: Payer: Self-pay | Admitting: Internal Medicine

## 2017-07-14 ENCOUNTER — Encounter: Payer: Self-pay | Admitting: Rheumatology

## 2017-07-14 ENCOUNTER — Other Ambulatory Visit: Payer: Self-pay | Admitting: Rheumatology

## 2017-07-14 ENCOUNTER — Ambulatory Visit (INDEPENDENT_AMBULATORY_CARE_PROVIDER_SITE_OTHER): Payer: Medicare Other | Admitting: Rheumatology

## 2017-07-14 VITALS — BP 136/64 | HR 61 | Ht 63.0 in | Wt 152.0 lb

## 2017-07-14 DIAGNOSIS — Z79899 Other long term (current) drug therapy: Secondary | ICD-10-CM

## 2017-07-14 DIAGNOSIS — M5136 Other intervertebral disc degeneration, lumbar region: Secondary | ICD-10-CM | POA: Diagnosis not present

## 2017-07-14 DIAGNOSIS — M5030 Other cervical disc degeneration, unspecified cervical region: Secondary | ICD-10-CM | POA: Diagnosis not present

## 2017-07-14 DIAGNOSIS — M0609 Rheumatoid arthritis without rheumatoid factor, multiple sites: Secondary | ICD-10-CM

## 2017-07-14 DIAGNOSIS — M47812 Spondylosis without myelopathy or radiculopathy, cervical region: Secondary | ICD-10-CM

## 2017-07-14 MED ORDER — METHOTREXATE SODIUM CHEMO INJECTION 50 MG/2ML: 10.0000 mg | INTRAMUSCULAR | 0 refills | Status: DC

## 2017-07-14 MED ORDER — TOFACITINIB CITRATE 5 MG PO TABS: ORAL_TABLET | ORAL | 2 refills | Status: DC

## 2017-07-14 NOTE — Telephone Encounter (Signed)
Last Visit: 05/28/17 Next Visit: 07/29/17 Labs: 06/11/17 Stable  Okay to refill per Dr. Estanislado Pandy

## 2017-07-14 NOTE — Patient Instructions (Signed)
Continues Xeljanz 5 mg twice a day Continue methotrexate 0.4 ML's every week Continue folic acid 1 mg every day Return to clinic in 3 months

## 2017-07-14 NOTE — Progress Notes (Signed)
Office Visit Note  Patient: Tony Mcmillan             Date of Birth: 20-Nov-1941           MRN: [id number]             PCP: Girtha Rm, NP-C Referring: Girtha Rm, NP-C Visit Date: 07/14/2017 Occupation: @GUAROCC @    Subjective:  Follow-up (follow up of RA and get bloodwork possibly)   History of Present Illness: Tony Mcmillan is a 76 y.o. 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 And high risk prescription (Enbrel every week, methotrexate 0.4 mils per week and folic acid 2 mg daily). On the last visit, patient was not doing well Enbrel and was going to be switched his Morrie Sheldon. Also, the kidney function was affected and methotrexate dose was reduced).  Note: Patient was on Enbrel for about 2 months and did not notice any improvement with combination of Enbrel and methotrexate. In addition, his methotrexate was reduced from 0.8 ML's to 0.4 mils due to decreasing GFR. He also had ongoing pain in his hands (with swelling) and knee, feet, neck, low back pain.    Activities of Daily Living:  Patient reports morning stiffness for 30 minutes.   Patient Denies nocturnal pain.  Difficulty dressing/grooming: Denies Difficulty climbing stairs: Denies Difficulty getting out of chair: Denies Difficulty using hands for taps, buttons, cutlery, and/or writing: Denies   Review of Systems  Constitutional: Negative for fatigue.  HENT: Negative for mouth sores and mouth dryness.   Eyes: Negative for dryness.  Respiratory: Negative for shortness of breath.   Gastrointestinal: Negative for constipation and diarrhea.  Musculoskeletal: Negative for myalgias and myalgias.  Skin: Negative for sensitivity to sunlight.  Neurological: Negative for memory loss.  Psychiatric/Behavioral: Negative for sleep disturbance.    PMFS History:  Patient Active Problem List   Diagnosis Date Noted  . Feeling of chest tightness, unclear cause, ? due to RA  02/21/2017  . Pulmonary emphysema ([place]) 12/18/2016  . Dyspnea and respiratory abnormality 11/25/2016  . 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[place]) 11/04/2016  . High risk medication use 11/04/2016  . 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 Vitamin D deficiency 11/04/2016  . 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 History of anemia 07/02/2016  . History of colonic polyps 07/02/2016  . History of gastric ulcer 07/02/2016  . Asymptomatic microscopic hematuria 04/03/2016  . Benign prostatic hyperplasia 04/03/2016  . Renal cyst, left 04/03/2016  . 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 DDD (degenerative disc disease), lumbar 04/03/2016  . Colonic polyp 02/23/2016  . Duodenal ulcer 02/23/2016    Past Medical History:  Diagnosis Date  . Anemia   . Atypical chest pain    a. 09/2016 MV: EF 63%, normal perfusion.  . Blood transfusion without reported diagnosis   . Diastolic dysfunction    a. 2015 Echo: EF >55%;  b. 09/2016 Echo: EF 55-60%, no rwma, Gr1 DD, triv AI/MR, mild TR, PASP 22mHg.  . [place] 02/08[place]  . GERD (gastroesophageal reflux disease)   . Rheumatoid arthritis [place]     Family History  Problem Relation Age of Onset  . Hypertension Mother   . Hypertension Father   . Diabetes Father   . Hyperlipidemia Sister   . Diabetes Sister   . Stroke Brother   . Diabetes Brother   . Colon polyps Neg Hx   . Esophageal cancer Neg  Hx   . Stomach cancer Neg Hx   . Rectal cancer Neg Hx   . Colon cancer Neg Hx    Past Surgical History:  Procedure Laterality Date  . ANTERIOR CERVICAL CORPECTOMY    . ANTERIOR CERVICAL CORPECTOMY  12/2014   for infection. this was in [place]  . CATARACT EXTRACTION     both eyes  . COLOSTOMY REVERSAL     had infection on buttocks and had to have a skin graft- had colostomy to help area stay clean and heal  . HERNIA REPAIR    . SPINE SURGERY     Social History   Social History Narrative   Retired. Lives  alone.      Objective: Vital Signs: BP 136/64 (BP Location: Right Arm, Patient Position: Sitting, Cuff Size: Normal)   Pulse 61   Ht 5' 3"  (1.6 m)   Wt 152 lb (68.9 kg)   BMI 26.93 kg/m    Physical Exam  Constitutional: He is oriented to person, place, and time. He appears well-developed and well-nourished.  HENT:  Head: Normocephalic and atraumatic.  Eyes: Pupils are equal, round, and reactive to light. Conjunctivae and EOM are normal.  Neck: Normal range of motion. Neck supple.  Cardiovascular: Normal rate, regular rhythm and normal heart sounds.  Exam reveals no gallop and no friction rub.   No murmur heard. Pulmonary/Chest: Effort normal and breath sounds normal. No respiratory distress. He has no wheezes. He has no rales. He exhibits no tenderness.  Abdominal: Soft. He exhibits no distension and no mass. There is no tenderness. There is no guarding.  Musculoskeletal: Normal range of motion.  Lymphadenopathy:    He has no cervical adenopathy.  Neurological: He is alert and oriented to person, place, and time. He exhibits normal muscle tone. Coordination normal.  Skin: Skin is warm and dry. Capillary refill takes less than 2 seconds. No rash noted.  Psychiatric: He has a normal mood and affect. His behavior is normal. Judgment and thought content normal.  Vitals reviewed.    Musculoskeletal Exam:  Full range of motion of all joints except unable to abductor shoulders. Only 90 of abduction of shoulders. Decreased range of motion of bilateral wrists with left wrist unable to flex or extend. Right wrist with some extension and flexion. 20 elbow contractures. On right 10 elbow contracture on left   CDAI Exam: CDAI Homunculus Exam:   Swelling:  LUE: wrist  Joint Counts:  CDAI Tender Joint count: 0 CDAI Swollen Joint count: 1   Left wrist joint with mild swelling (improved compared to last visit) but no pain.  Investigation: No additional findings. Orders Only on  06/11/2017  Component Date Value Ref Range Status  . WBC 06/11/2017 5.8  3.8 - 10.8 K/uL Final  . RBC 06/11/2017 4.84  4.20 - 5.80 MIL/uL Final  . Hemoglobin 06/11/2017 14.0  13.2 - 17.1 g/dL Final  . HCT 06/11/2017 41.5  38.5 - 50.0 % Final  . MCV 06/11/2017 85.7  80.0 - 100.0 fL Final  . MCH 06/11/2017 28.9  27.0 - 33.0 pg Final  . MCHC 06/11/2017 33.7  32.0 - 36.0 g/dL Final  . RDW 06/11/2017 16.5* 11.0 - 15.0 % Final  . Platelets 06/11/2017 253  140 - 400 K/uL Final  . MPV 06/11/2017 9.4  7.5 - 12.5 fL Final  . Neutro Abs 06/11/2017 4118  1,500 - 7,800 cells/uL Final  . Lymphs Abs 06/11/2017 812* 850 - 3,900 cells/uL Final  . Monocytes  Absolute 06/11/2017 696  200 - 950 cells/uL Final  . Eosinophils Absolute 06/11/2017 116  15 - 500 cells/uL Final  . Basophils Absolute 06/11/2017 58  0 - 200 cells/uL Final  . Neutrophils Relative % 06/11/2017 71  % Final  . Lymphocytes Relative 06/11/2017 14  % Final  . Monocytes Relative 06/11/2017 12  % Final  . Eosinophils Relative 06/11/2017 2  % Final  . Basophils Relative 06/11/2017 1  % Final  . Smear Review 06/11/2017 Criteria for review not met   Final  . Sodium 06/11/2017 139  135 - 146 mmol/L Final  . Potassium 06/11/2017 4.5  3.5 - 5.3 mmol/L Final  . Chloride 06/11/2017 105  98 - 110 mmol/L Final  . CO2 06/11/2017 22  20 - 31 mmol/L Final  . Glucose, Bld 06/11/2017 81  65 - 99 mg/dL Final  . BUN 06/11/2017 16  7 - 25 mg/dL Final  . Creat 06/11/2017 1.23* 0.70 - 1.18 mg/dL Final   Comment:   For patients > or = 75 years of age: The upper reference limit for Creatinine is approximately 13% higher for people identified as African-American.     . Total Bilirubin 06/11/2017 1.1  0.2 - 1.2 mg/dL Final  . Alkaline Phosphatase 06/11/2017 58  40 - 115 U/L Final  . AST 06/11/2017 19  10 - 35 U/L Final  . ALT 06/11/2017 16  9 - 46 U/L Final  . Total Protein 06/11/2017 7.3  6.1 - 8.1 g/dL Final  . Albumin 06/11/2017 4.1  3.6 - 5.1 g/dL  Final  . Calcium 06/11/2017 9.5  8.6 - 10.3 mg/dL Final  . GFR, Est African American 06/11/2017 66  >=60 mL/min Final  . GFR, Est Non African American 06/11/2017 57* >=60 mL/min Final  Office Visit on 05/28/2017  Component Date Value Ref Range Status  . Sodium 05/28/2017 136  135 - 146 mmol/L Final  . Potassium 05/28/2017 4.7  3.5 - 5.3 mmol/L Final  . Chloride 05/28/2017 102  98 - 110 mmol/L Final  . CO2 05/28/2017 23  20 - 31 mmol/L Final  . Glucose, Bld 05/28/2017 85  65 - 99 mg/dL Final  . BUN 05/28/2017 13  7 - 25 mg/dL Final  . Creat 05/28/2017 1.23* 0.70 - 1.18 mg/dL Final   Comment:   For patients > or = 75 years of age: The upper reference limit for Creatinine is approximately 13% higher for people identified as African-American.     . Calcium 05/28/2017 9.2  8.6 - 10.3 mg/dL Final  . GFR, Est African American 05/28/2017 66  >=60 mL/min Final  . GFR, Est Non African American 05/28/2017 57* >=60 mL/min Final  Orders Only on 05/01/2017  Component Date Value Ref Range Status  . WBC 05/01/2017 5.9  3.8 - 10.8 K/uL Final  . RBC 05/01/2017 4.85  4.20 - 5.80 MIL/uL Final  . Hemoglobin 05/01/2017 14.1  13.2 - 17.1 g/dL Final  . HCT 05/01/2017 42.0  38.5 - 50.0 % Final  . MCV 05/01/2017 86.6  80.0 - 100.0 fL Final  . MCH 05/01/2017 29.1  27.0 - 33.0 pg Final  . MCHC 05/01/2017 33.6  32.0 - 36.0 g/dL Final  . RDW 05/01/2017 17.3* 11.0 - 15.0 % Final  . Platelets 05/01/2017 226  140 - 400 K/uL Final  . MPV 05/01/2017 9.5  7.5 - 12.5 fL Final  . Neutro Abs 05/01/2017 4307  1,500 - 7,800 cells/uL Final  . Lymphs Abs 05/01/2017  944  850 - 3,900 cells/uL Final  . Monocytes Absolute 05/01/2017 531  200 - 950 cells/uL Final  . Eosinophils Absolute 05/01/2017 59  15 - 500 cells/uL Final  . Basophils Absolute 05/01/2017 59  0 - 200 cells/uL Final  . Neutrophils Relative % 05/01/2017 73  % Final  . Lymphocytes Relative 05/01/2017 16  % Final  . Monocytes Relative 05/01/2017 9  % Final    . Eosinophils Relative 05/01/2017 1  % Final  . Basophils Relative 05/01/2017 1  % Final  . Smear Review 05/01/2017 Criteria for review not met   Final  . Sodium 05/01/2017 139  135 - 146 mmol/L Final  . Potassium 05/01/2017 4.8  3.5 - 5.3 mmol/L Final  . Chloride 05/01/2017 103  98 - 110 mmol/L Final  . CO2 05/01/2017 23  20 - 31 mmol/L Final  . Glucose, Bld 05/01/2017 120* 65 - 99 mg/dL Final  . BUN 05/01/2017 16  7 - 25 mg/dL Final  . Creat 05/01/2017 1.25* 0.70 - 1.18 mg/dL Final   Comment:   For patients > or = 75 years of age: The upper reference limit for Creatinine is approximately 13% higher for people identified as African-American.     . Total Bilirubin 05/01/2017 0.9  0.2 - 1.2 mg/dL Final  . Alkaline Phosphatase 05/01/2017 51  40 - 115 U/L Final  . AST 05/01/2017 25  10 - 35 U/L Final  . ALT 05/01/2017 20  9 - 46 U/L Final  . Total Protein 05/01/2017 7.0  6.1 - 8.1 g/dL Final  . Albumin 05/01/2017 3.8  3.6 - 5.1 g/dL Final  . Calcium 05/01/2017 9.4  8.6 - 10.3 mg/dL Final  . GFR, Est African American 05/01/2017 65  >=60 mL/min Final  . GFR, Est Non African American 05/01/2017 56* >=60 mL/min Final  Clinical Support on 04/23/2017  Component Date Value Ref Range Status  . Cholesterol 04/23/2017 185  <200 mg/dL Final  . Triglycerides 04/23/2017 130  <150 mg/dL Final  . HDL 04/23/2017 50  >40 mg/dL Final  . Total CHOL/HDL Ratio 04/23/2017 3.7  <5.0 Ratio Final  . VLDL 04/23/2017 26  <30 mg/dL Final  . LDL Cholesterol 04/23/2017 109* <100 mg/dL Final  . Vit D, 25-Hydroxy 04/23/2017 55  30 - 100 ng/mL Final   Comment: Vitamin D Status           25-OH Vitamin D        Deficiency                <20 ng/mL        Insufficiency         20 - 29 ng/mL        Optimal             > or = 30 ng/mL   For 25-OH Vitamin D testing on patients on D2-supplementation and patients for whom quantitation of D2 and D3 fractions is required, the QuestAssureD 25-OH VIT D, (D2,D3), LC/MS/MS  is recommended: order code [id number] (patients > 2 yrs).   . PSA 04/23/2017 1.1  <=4.0 ng/mL Final   Comment:   The total PSA value from this assay system is standardized against the WHO standard. The test result will be approximately 20% lower when compared to the equimolar-standardized total PSA (Beckman Coulter). Comparison of serial PSA results should be interpreted with this fact in mind.   This test was performed using the Siemens chemiluminescent method. Values obtained from different  assay methods cannot be used interchangeably. PSA levels, regardless of value, should not be interpreted as absolute evidence of the presence or absence of disease.     Office Visit on 03/26/2017  Component Date Value Ref Range Status  . WBC 03/26/2017 5.5  3.8 - 10.8 K/uL Final  . RBC 03/26/2017 4.74  4.20 - 5.80 MIL/uL Final  . Hemoglobin 03/26/2017 13.5  13.2 - 17.1 g/dL Final  . HCT 03/26/2017 39.8  38.5 - 50.0 % Final  . MCV 03/26/2017 84.0  80.0 - 100.0 fL Final  . MCH 03/26/2017 28.5  27.0 - 33.0 pg Final  . MCHC 03/26/2017 33.9  32.0 - 36.0 g/dL Final  . RDW 03/26/2017 17.1* 11.0 - 15.0 % Final  . Platelets 03/26/2017 257  140 - 400 K/uL Final  . MPV 03/26/2017 9.0  7.5 - 12.5 fL Final  . Neutro Abs 03/26/2017 3795  1,500 - 7,800 cells/uL Final  . Lymphs Abs 03/26/2017 990  850 - 3,900 cells/uL Final  . Monocytes Absolute 03/26/2017 605  200 - 950 cells/uL Final  . Eosinophils Absolute 03/26/2017 110  15 - 500 cells/uL Final  . Basophils Absolute 03/26/2017 0  0 - 200 cells/uL Final  . Neutrophils Relative % 03/26/2017 69  % Final  . Lymphocytes Relative 03/26/2017 18  % Final  . Monocytes Relative 03/26/2017 11  % Final  . Eosinophils Relative 03/26/2017 2  % Final  . Basophils Relative 03/26/2017 0  % Final  . Smear Review 03/26/2017 Criteria for review not met   Final  . Sodium 03/26/2017 137  135 - 146 mmol/L Final  . Potassium 03/26/2017 4.1  3.5 - 5.3 mmol/L Final  . Chloride  03/26/2017 106  98 - 110 mmol/L Final  . CO2 03/26/2017 22  20 - 31 mmol/L Final  . Glucose, Bld 03/26/2017 83  65 - 99 mg/dL Final  . BUN 03/26/2017 18  7 - 25 mg/dL Final  . Creat 03/26/2017 1.07  0.70 - 1.18 mg/dL Final   Comment:   For patients > or = 75 years of age: The upper reference limit for Creatinine is approximately 13% higher for people identified as African-American.     . Total Bilirubin 03/26/2017 1.2  0.2 - 1.2 mg/dL Final  . Alkaline Phosphatase 03/26/2017 69  40 - 115 U/L Final  . AST 03/26/2017 23  10 - 35 U/L Final  . ALT 03/26/2017 28  9 - 46 U/L Final  . Total Protein 03/26/2017 6.7  6.1 - 8.1 g/dL Final  . Albumin 03/26/2017 3.6  3.6 - 5.1 g/dL Final  . Calcium 03/26/2017 9.0  8.6 - 10.3 mg/dL Final  . GFR, Est African American 03/26/2017 78  >=60 mL/min Final  . GFR, Est Non African American 03/26/2017 68  >=60 mL/min Final  Office Visit on 02/25/2017  Component Date Value Ref Range Status  . WBC 02/25/2017 5.1  3.8 - 10.8 K/uL Final  . RBC 02/25/2017 4.82  4.20 - 5.80 MIL/uL Final  . Hemoglobin 02/25/2017 13.6  13.2 - 17.1 g/dL Final  . HCT 02/25/2017 40.4  38.5 - 50.0 % Final  . MCV 02/25/2017 83.8  80.0 - 100.0 fL Final  . MCH 02/25/2017 28.2  27.0 - 33.0 pg Final  . MCHC 02/25/2017 33.7  32.0 - 36.0 g/dL Final  . RDW 02/25/2017 17.3* 11.0 - 15.0 % Final  . Platelets 02/25/2017 279  140 - 400 K/uL Final  . MPV 02/25/2017 8.8  7.5 - 12.5 fL Final  . Neutro Abs 02/25/2017 3366  1,500 - 7,800 cells/uL Final  . Lymphs Abs 02/25/2017 1020  850 - 3,900 cells/uL Final  . Monocytes Absolute 02/25/2017 612  200 - 950 cells/uL Final  . Eosinophils Absolute 02/25/2017 102  15 - 500 cells/uL Final  . Basophils Absolute 02/25/2017 0  0 - 200 cells/uL Final  . Neutrophils Relative % 02/25/2017 66  % Final  . Lymphocytes Relative 02/25/2017 20  % Final  . Monocytes Relative 02/25/2017 12  % Final  . Eosinophils Relative 02/25/2017 2  % Final  . Basophils  Relative 02/25/2017 0  % Final  . Smear Review 02/25/2017 Criteria for review not met   Final  . Sodium 02/25/2017 136  135 - 146 mmol/L Final  . Potassium 02/25/2017 4.2  3.5 - 5.3 mmol/L Final  . Chloride 02/25/2017 103  98 - 110 mmol/L Final  . CO2 02/25/2017 26  20 - 31 mmol/L Final  . Glucose, Bld 02/25/2017 86  65 - 99 mg/dL Final  . BUN 02/25/2017 11  7 - 25 mg/dL Final  . Creat 02/25/2017 1.02  0.70 - 1.18 mg/dL Final   Comment:   For patients > or = 75 years of age: The upper reference limit for Creatinine is approximately 13% higher for people identified as African-American.     . Total Bilirubin 02/25/2017 0.7  0.2 - 1.2 mg/dL Final  . Alkaline Phosphatase 02/25/2017 68  40 - 115 U/L Final  . AST 02/25/2017 23  10 - 35 U/L Final  . ALT 02/25/2017 20  9 - 46 U/L Final  . Total Protein 02/25/2017 7.1  6.1 - 8.1 g/dL Final  . Albumin 02/25/2017 3.8  3.6 - 5.1 g/dL Final  . Calcium 02/25/2017 9.2  8.6 - 10.3 mg/dL Final  . GFR, Est African American 02/25/2017 83  >=60 mL/min Final  . GFR, Est Non African American 02/25/2017 72  >=60 mL/min Final     Imaging: No results found.  Speciality Comments: No specialty comments available.   Procedures:  No procedures performed Allergies: Patient has no known allergies.   Assessment / Plan:     Visit Diagnoses: Rheumatoid arthritis of multiple sites with negative rheumatoid factor (HCC)  High risk medication use - Plan: CBC with Differential/Platelet, COMPLETE METABOLIC PANEL WITH GFR, Lipid panel, Quantiferon tb gold assay (blood)  DJD (degenerative joint disease), cervical  DDD (degenerative disc disease), lumbar    Plan: #1: Rheumatoid arthritis (seronegative) Note: Patient was initially on Enbrel and methotrexate combo. Despite 2 months of Enbrel, patient had no improvement and on 05/28/2017 visit, He was switched to [place]. Also, his kidney function was affected with methotrexate and it was decreased from 0.8 to  0.4 ML. Today, patient reportsThat he is doing better with his rheumatoid arthritis after being on xeljanz. No complaint; doing better. All joints are improved vs last office visit.  #2:Xeljanz 24m bid CBC with differential, CMP with GFR, lipid panel & tb gold  Will be done today (would like to monitor kidney function) Then, we will do CBC with differential and CMP with GFR every 2 months.  #3 slight decrease in creatinine and GFR on the last 2 labs 03/26/2017: Creatinine at 1.07 and GFR at 68 05/01/2017: Creatinine at 1.25 and GFR at 56 05/28/2017: Creatinine at 1.23 and GFR at 57  #4: ddd c and l spine  #5: Return to clinic in 3 months Continue mtx 0.473mq wk  Continue folic acid 34m qd Continue xeljanz 524mbid     Orders: Orders Placed This Encounter  Procedures  . CBC with Differential/Platelet  . COMPLETE METABOLIC PANEL WITH GFR  . Lipid panel  . Quantiferon tb gold assay (blood)   No orders of the defined types were placed in this encounter.   Face-to-face time spent with patient was 30 minutes. Greater than 50% of time was spent in counseling and coordination of care.  Follow-Up Instructions: No Follow-up on file.   NaEliezer LoftsPA-C  Note - This record has been created using [employer] Chart creation errors have been sought, but may not always  have been located. Such creation errors do not reflect on  the standard of medical care.

## 2017-07-15 DIAGNOSIS — Z79899 Other long term (current) drug therapy: Secondary | ICD-10-CM | POA: Diagnosis not present

## 2017-07-15 LAB — LIPID PANEL
Cholesterol: 228 mg/dL — ABNORMAL HIGH (ref <200)
HDL: 54 mg/dL (ref >40)
LDL Cholesterol: 140 mg/dL — ABNORMAL HIGH (ref <100)
Total CHOL/HDL Ratio: 4.2 Ratio (ref <5.0)
Triglycerides: 171 mg/dL — ABNORMAL HIGH (ref <150)
VLDL: 34 mg/dL — ABNORMAL HIGH (ref <30)

## 2017-07-15 LAB — CBC WITH DIFFERENTIAL/PLATELET
Basophils Absolute: 0 cells/uL (ref 0–200)
Basophils Relative: 0 %
Eosinophils Absolute: 98 cells/uL (ref 15–500)
Eosinophils Relative: 2 %
HCT: 44.6 % (ref 38.5–50.0)
Hemoglobin: 15.3 g/dL (ref 13.2–17.1)
Lymphocytes Relative: 14 %
Lymphs Abs: 686 cells/uL — ABNORMAL LOW (ref 850–3900)
MCH: 29.5 pg (ref 27.0–33.0)
MCHC: 34.3 g/dL (ref 32.0–36.0)
MCV: 85.9 fL (ref 80.0–100.0)
MPV: 9.5 fL (ref 7.5–12.5)
Monocytes Absolute: 392 cells/uL (ref 200–950)
Monocytes Relative: 8 %
Neutro Abs: 3724 cells/uL (ref 1500–7800)
Neutrophils Relative %: 76 %
Platelets: 276 10*3/uL (ref 140–400)
RBC: 5.19 MIL/uL (ref 4.20–5.80)
RDW: 16.2 % — ABNORMAL HIGH (ref 11.0–15.0)
WBC: 4.9 10*3/uL (ref 3.8–10.8)

## 2017-07-15 LAB — COMPLETE METABOLIC PANEL WITH GFR
ALT: 17 U/L (ref 9–46)
AST: 20 U/L (ref 10–35)
Albumin: 4.1 g/dL (ref 3.6–5.1)
Alkaline Phosphatase: 64 U/L (ref 40–115)
BUN: 12 mg/dL (ref 7–25)
CO2: 23 mmol/L (ref 20–31)
Calcium: 9.2 mg/dL (ref 8.6–10.3)
Chloride: 101 mmol/L (ref 98–110)
Creat: 1.26 mg/dL — ABNORMAL HIGH (ref 0.70–1.18)
GFR, Est African American: 64 mL/min (ref >=60)
GFR, Est Non African American: 55 mL/min — ABNORMAL LOW (ref >=60)
Glucose, Bld: 83 mg/dL (ref 65–99)
Potassium: 4.6 mmol/L (ref 3.5–5.3)
Sodium: 134 mmol/L — ABNORMAL LOW (ref 135–146)
Total Bilirubin: 1.3 mg/dL — ABNORMAL HIGH (ref 0.2–1.2)
Total Protein: 7.4 g/dL (ref 6.1–8.1)

## 2017-07-16 ENCOUNTER — Telehealth: Payer: Self-pay | Admitting: Rheumatology

## 2017-07-16 NOTE — Telephone Encounter (Signed)
Patient request a call back to discuss lab results.

## 2017-07-17 LAB — QUANTIFERON TB GOLD ASSAY (BLOOD)

## 2017-07-17 NOTE — Telephone Encounter (Signed)
Patient advised of lab results and verbalized understanding.  

## 2017-07-19 ENCOUNTER — Encounter: Payer: Self-pay | Admitting: Rheumatology

## 2017-07-23 NOTE — Telephone Encounter (Signed)
Not to be concerned about. We will monitor that.

## 2017-07-29 ENCOUNTER — Ambulatory Visit: Payer: Medicare Other | Admitting: Rheumatology

## 2017-07-30 ENCOUNTER — Encounter: Payer: Self-pay | Admitting: Family Medicine

## 2017-07-30 ENCOUNTER — Ambulatory Visit (INDEPENDENT_AMBULATORY_CARE_PROVIDER_SITE_OTHER): Payer: Medicare Other | Admitting: Family Medicine

## 2017-07-30 VITALS — BP 120/70 | HR 71 | Wt 149.8 lb

## 2017-07-30 DIAGNOSIS — E7800 Pure hypercholesterolemia, unspecified: Secondary | ICD-10-CM

## 2017-07-30 NOTE — Patient Instructions (Signed)
Return fasting (nothing to eat or drink except water for at least 6 hours) and we will recheck your cholesterol.

## 2017-07-30 NOTE — Progress Notes (Signed)
   Subjective:    Patient ID: Tony Mcmillan, male    DOB: Apr 28, 1941, 76 y.o.   MRN: [id number]  HPI Chief Complaint  Patient presents with  . discuss med    discuss starting a medicine for cholesterol   He is here to discuss his cholesterol being elevated at his rheumatologist office 2 weeks ago. States he was not fasting. He is not fasting today.  No other concerns or complaints today.   Reviewed allergies, medications, past medical, and social history.   Review of Systems Pertinent positives and negatives in the history of present illness.     Objective:   Physical Exam BP 120/70   Pulse 71   Wt 149 lb 12.8 oz (67.9 kg)   BMI 26.54 kg/m   Alert and oriented and in no acute distress. Not examined otherwise.       Assessment & Plan:  Elevated LDL cholesterol level - Plan: Lipid panel  Discussed that since he was not fasting when his lipids were checked 2-3 weeks ago we will repeat this fasting and treat as appropriate. Reviewed labs with patient. He will return in the next 2 days.  Follow up pending lab results.

## 2017-07-31 ENCOUNTER — Other Ambulatory Visit: Payer: Medicare Other

## 2017-07-31 ENCOUNTER — Telehealth: Payer: Self-pay | Admitting: Rheumatology

## 2017-07-31 DIAGNOSIS — E7800 Pure hypercholesterolemia, unspecified: Secondary | ICD-10-CM

## 2017-07-31 LAB — LIPID PANEL
Cholesterol: 224 mg/dL — ABNORMAL HIGH (ref <200)
HDL: 57 mg/dL (ref >40)
LDL Cholesterol: 139 mg/dL — ABNORMAL HIGH (ref <100)
Total CHOL/HDL Ratio: 3.9 Ratio (ref <5.0)
Triglycerides: 141 mg/dL (ref <150)
VLDL: 28 mg/dL (ref <30)

## 2017-07-31 NOTE — Telephone Encounter (Signed)
Patient advised he may received the flu vaccine as it is not a live virus. Patient advised not to get the flu mist. Patient verbalized understanding.

## 2017-07-31 NOTE — Telephone Encounter (Signed)
Patient called yesterday (while Epic was down) and wants to know if it is okay to get a flu shot because of the medications he is on? Please advise.

## 2017-08-01 ENCOUNTER — Other Ambulatory Visit: Payer: Self-pay | Admitting: Family Medicine

## 2017-08-01 DIAGNOSIS — E7800 Pure hypercholesterolemia, unspecified: Secondary | ICD-10-CM

## 2017-08-01 DIAGNOSIS — Z23 Encounter for immunization: Secondary | ICD-10-CM | POA: Diagnosis not present

## 2017-08-01 MED ORDER — ATORVASTATIN CALCIUM 10 MG PO TABS: 10.0000 mg | ORAL_TABLET | Freq: Every day | ORAL | 1 refills | Status: DC

## 2017-08-01 NOTE — Progress Notes (Signed)
   Subjective:    Patient ID: Tony Mcmillan, male    DOB: 04-Mar-1941, 76 y.o.   MRN: [id number]  HPI    Review of Systems     Objective:   Physical Exam        Assessment & Plan:

## 2017-08-18 ENCOUNTER — Telehealth: Payer: Self-pay | Admitting: Rheumatology

## 2017-08-18 NOTE — Telephone Encounter (Signed)
Patient advised he will be due for labs in approximately 1 month. Patient verbalized understanding.

## 2017-08-18 NOTE — Telephone Encounter (Signed)
Patient left a message asking if he needs to come in for lab work before his 10/24 rov. Please call patient to advise.

## 2017-08-21 IMAGING — CT CT CHEST HIGH RESOLUTION W/O CM
2 of 6 series · 14 of 36 positions shown, 17 images · non-contrast
Comparison: No priors.

CLINICAL DATA: 75-year-old male with history of dyspnea and
respiratory difficulty. Rheumatoid arthritis. Shortness of breath.
History of emphysema.

EXAM:
CT CHEST WITHOUT CONTRAST
TECHNIQUE: Multidetector CT imaging of the chest was performed following the
standard protocol without intravenous contrast. High resolution
imaging of the lungs, as well as inspiratory and expiratory imaging,
was performed.

[Series 4: high resolution · axial · 0.67mm/px · z∈[-335,-97]mm · 11 of 134 slices shown, 14 images]
[im 8/134  mediastinal]
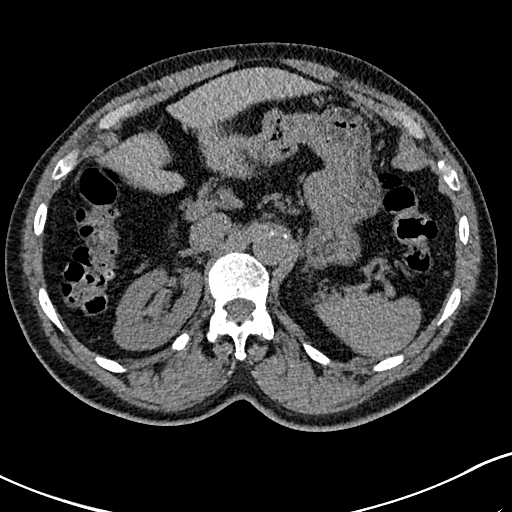
[im 8/134  lung]
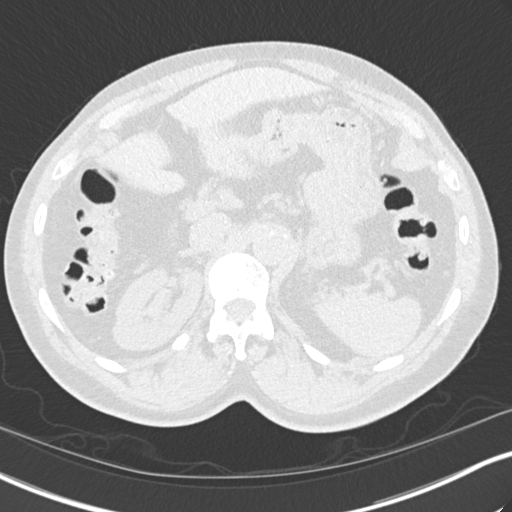
[im 22/134  lung]
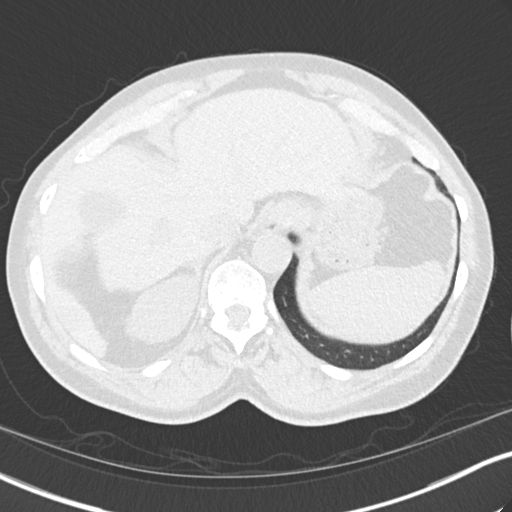
[im 36/134  lung]
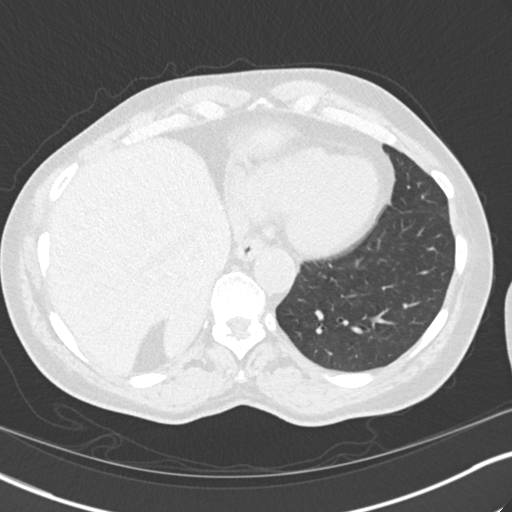
[im 43/134  lung]
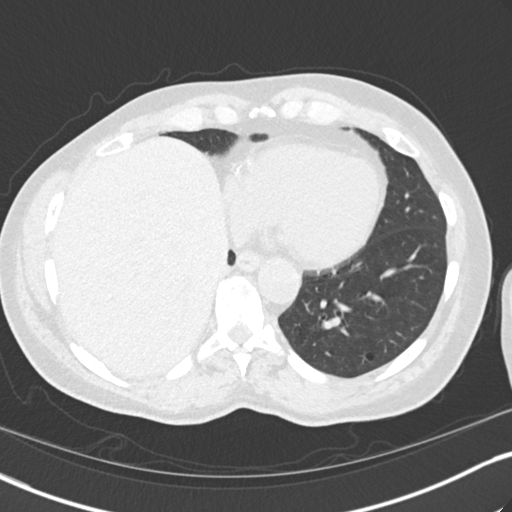
[im 57/134  mediastinal]
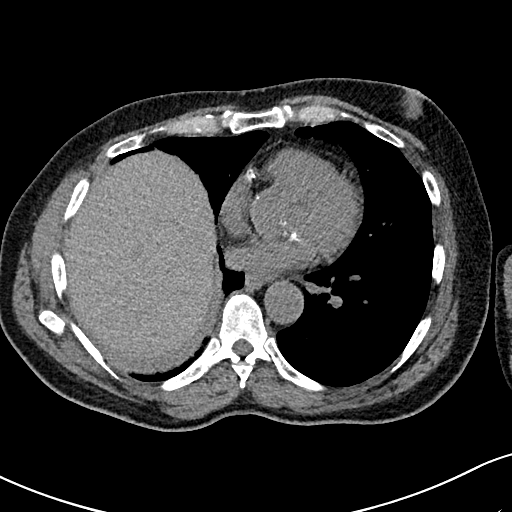
[im 57/134  lung]
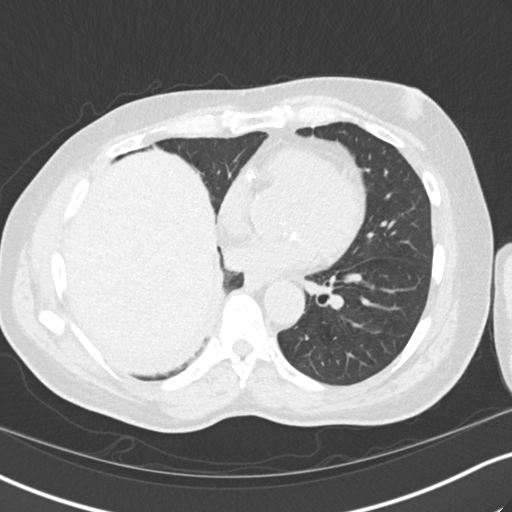
[im 71/134  lung]
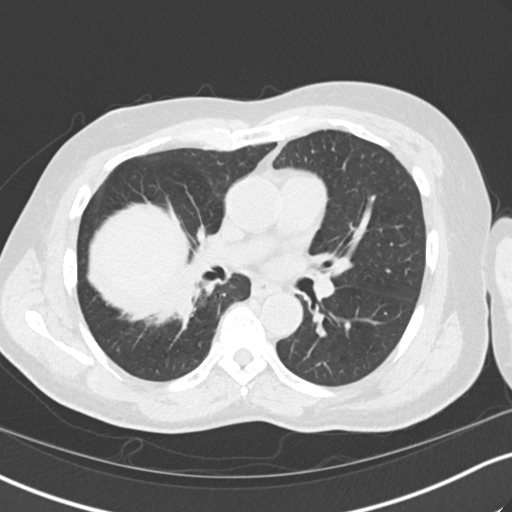
[im 78/134  lung]
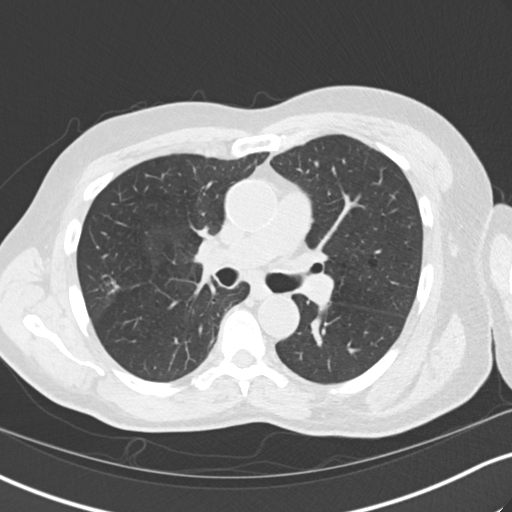
[im 92/134  lung]
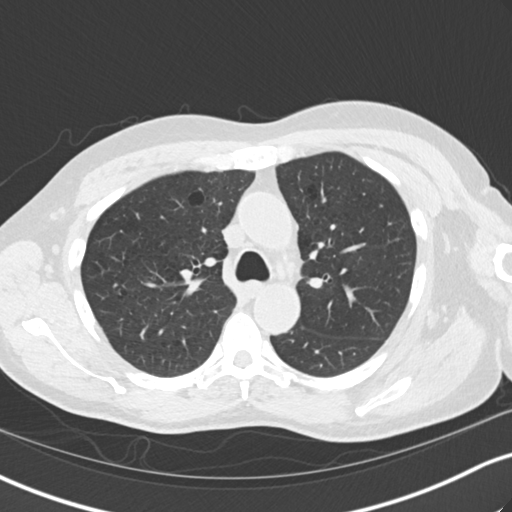
[im 99/134  mediastinal]
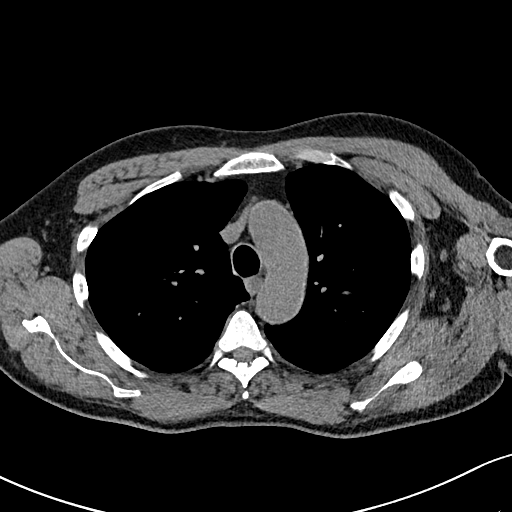
[im 99/134  lung]
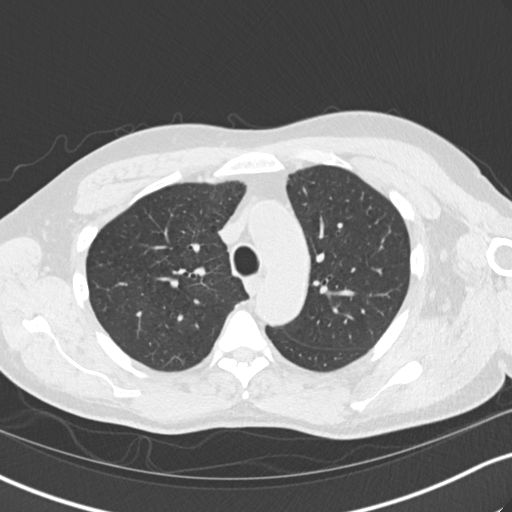
[im 113/134  lung]
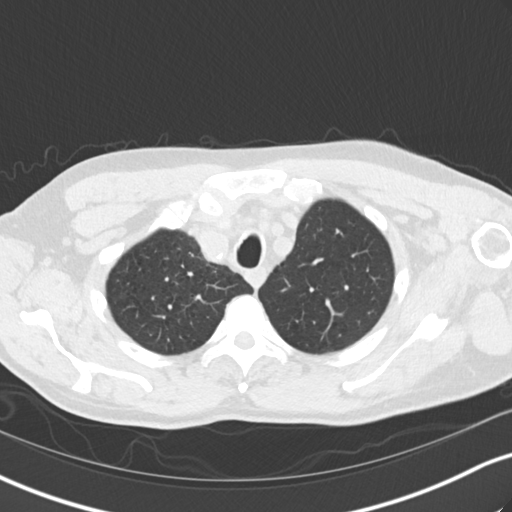
[im 127/134  lung]
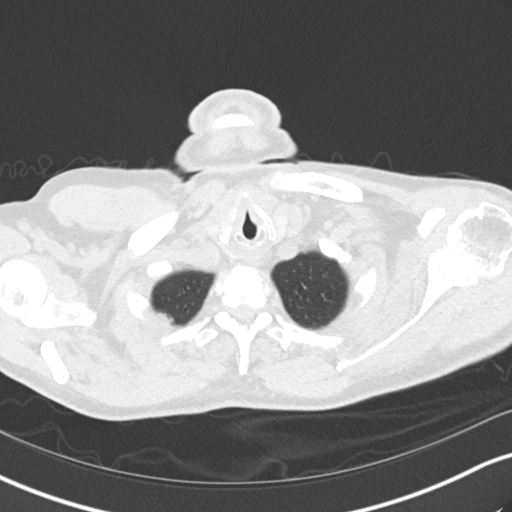

[Series 8: coronal · coronal · 0.54mm/px · 3 of 115 slices shown]
[im 23/115  lung]
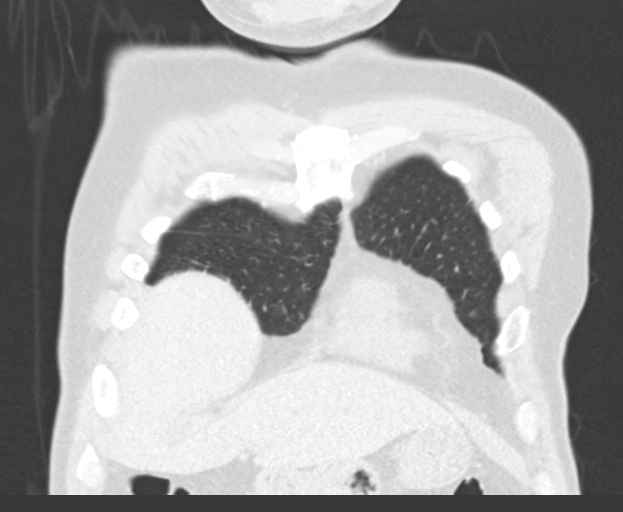
[im 46/115  lung]
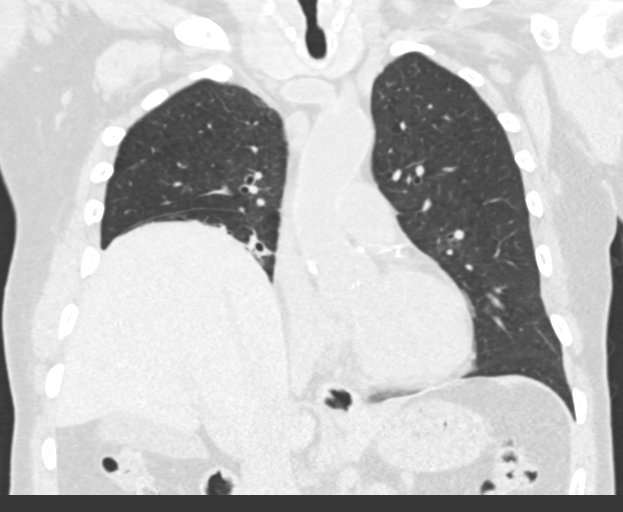
[im 69/115  lung]
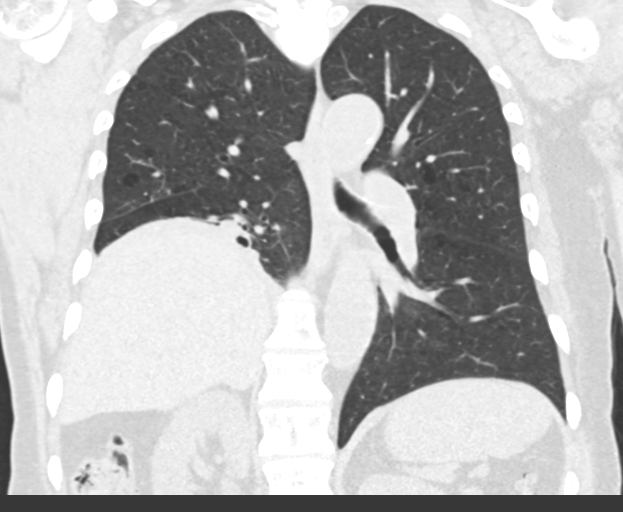

[14 of 36 positions shown; findings below may reference images not displayed]

FINDINGS: Cardiovascular: Heart size is normal. There is no significant
pericardial fluid, thickening or pericardial calcification. There is
aortic atherosclerosis, as well as atherosclerosis of the great
vessels of the mediastinum and the coronary arteries, including
calcified atherosclerotic plaque in the left main, left anterior
descending, left circumflex and right coronary arteries.
Calcifications of the aortic valve (mild).

Mediastinum/Nodes: No pathologically enlarged mediastinal or hilar
lymph nodes. Please note that accurate exclusion of hilar adenopathy
is limited on noncontrast CT scans. Esophagus is unremarkable in
appearance. No axillary lymphadenopathy.

Lungs/Pleura: Elevation of the right hemidiaphragm. No confluent
consolidative airspace disease. No pleural effusions. Mild diffuse
bronchial wall thickening with mild centrilobular emphysema. Small
calcified granuloma in the left lower lobe. 3 mm pulmonary nodule in
the right upper lobe near the apex (image 24 of series 5). No other
larger more suspicious appearing pulmonary nodules or masses are
noted. There is a focal area of architectural distortion in the
posterior aspect of the right upper lobe adjacent to the major
fissure, best appreciated on image 53 of series 5, where there
appears to be several bronchi with more focal thickening of the
peribronchovascular interstitium. High-resolution images otherwise
demonstrate no significant regions of ground-glass attenuation,
septal thickening, parenchymal banding, traction bronchiectasis or
frank honeycombing. Inspiratory and expiratory imaging demonstrates
some mild air trapping, indicative of small airways disease.

Upper Abdomen: Unremarkable.

Musculoskeletal: There are no aggressive appearing lytic or blastic
lesions noted in the visualized portions of the skeleton.
IMPRESSION: 1. No findings to suggest interstitial lung disease.
2. Mild diffuse bronchial wall thickening with mild centrilobular
emphysema; imaging findings suggestive of underlying COPD.
3. Focal area of scarring in the posterior aspect of the right upper
lobe likely from prior infection.
4. Aortic atherosclerosis, in addition to left main and 3 vessel
coronary artery disease. Assessment for potential risk factor
modification, dietary therapy or pharmacologic therapy may be
warranted, if clinically indicated.
5. There are mild calcifications of the aortic valve.
Echocardiographic correlation for evaluation of potential valvular
dysfunction may be warranted if clinically indicated.

## 2017-08-24 ENCOUNTER — Other Ambulatory Visit: Payer: Self-pay | Admitting: Rheumatology

## 2017-08-26 NOTE — Telephone Encounter (Signed)
Last Visit: 07/14/17 Next Visit: 10/15/17 Labs: 07/14/17 stable  Okay to refill per Dr. Estanislado Pandy

## 2017-09-07 ENCOUNTER — Other Ambulatory Visit: Payer: Self-pay | Admitting: Rheumatology

## 2017-09-08 NOTE — Telephone Encounter (Signed)
Last Visit: 07/14/17 Next Visit: 10/15/17 Labs: 07/14/17 stable  Okay to refill per Dr. Estanislado Pandy

## 2017-09-18 ENCOUNTER — Other Ambulatory Visit: Payer: Self-pay | Admitting: Family Medicine

## 2017-09-18 DIAGNOSIS — E7800 Pure hypercholesterolemia, unspecified: Secondary | ICD-10-CM

## 2017-09-19 NOTE — Telephone Encounter (Signed)
Pt is to come back in mid October for recheck per lab results sent to pt

## 2017-10-04 NOTE — Progress Notes (Signed)
Office Visit Note  Patient: Tony Mcmillan             Date of Birth: 1941-07-24           MRN: [id number]             PCP: Girtha Rm, NP-C Referring: Girtha Rm, NP-C Visit Date: 10/15/2017 Occupation: @GUAROCC @    Subjective:  Pain in hands  History of Present Illness: Blake Vetrano is a 76 y.o. male with history of rheumatoid arthritis osteoarthritis and disc disease. He states he continues to have some discomfort in his hands. He denies any joint swelling. 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 He denies any lower back pain.   Activities of Daily Living:  Patient reports morning stiffness for 0 minutes.   Patient Denies nocturnal pain.  Difficulty dressing/grooming: Denies Difficulty climbing stairs: Reports Difficulty getting out of chair: Denies Difficulty using hands for taps, buttons, cutlery, and/or writing: Reports   Review of Systems  Constitutional: Negative.  Negative for fatigue, night sweats and weakness ( ).  HENT: Negative.  Negative for mouth sores, mouth dryness and nose dryness.   Eyes: Negative.  Negative for redness and dryness.  Respiratory: Negative for cough, shortness of breath and difficulty breathing.   Cardiovascular: Negative for chest pain, palpitations, hypertension, irregular heartbeat and swelling in legs/feet.  Gastrointestinal: Negative.  Negative for constipation and diarrhea.  Endocrine: Negative for increased urination.  Musculoskeletal: Positive for arthralgias and joint pain. Negative for joint swelling, myalgias, muscle weakness, morning stiffness, muscle tenderness and myalgias.  Skin: Negative.  Negative for color change, rash, hair loss, nodules/bumps, skin tightness, ulcers and sensitivity to sunlight.  Allergic/Immunologic: Negative for susceptible to infections.  Neurological: Negative.  Negative for dizziness, fainting, memory loss and night sweats.  Hematological: Negative for swollen glands.  Psychiatric/Behavioral: Negative.  Negative  for depressed mood and sleep disturbance. The patient is not nervous/anxious.     PMFS History:  Patient Active Problem List   Diagnosis Date Noted  . Feeling of chest tightness, unclear cause, ? due to RA 02/21/2017  . Pulmonary emphysema ([place]) 12/18/2016  . Dyspnea and respiratory abnormality 11/25/2016  . 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[place]) 11/04/2016  . High risk medication use 11/04/2016  . 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 Vitamin D deficiency 11/04/2016  . 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 History of anemia 07/02/2016  . History of colonic polyps 07/02/2016  . History of gastric ulcer 07/02/2016  . Asymptomatic microscopic hematuria 04/03/2016  . Benign prostatic hyperplasia 04/03/2016  . Renal cyst, left 04/03/2016  . 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 DDD (degenerative disc disease), lumbar 04/03/2016  . Colonic polyp 02/23/2016  . Duodenal ulcer 02/23/2016    Past Medical History:  Diagnosis Date  . Anemia   . Atypical chest pain    a. 09/2016 MV: EF 63%, normal perfusion.  . Blood transfusion without reported diagnosis   . Diastolic dysfunction    a. 2015 Echo: EF >55%;  b. 09/2016 Echo: EF 55-60%, no rwma, Gr1 DD, triv AI/MR, mild TR, PASP 10mmHg.  . Emphysema of lung ([place]) 02/08/2016  . GERD (gastroesophageal reflux disease)   . Rheumatoid arthritis ([place])     Family History  Problem Relation Age of Onset  . Hypertension Mother   . Hypertension Father   . Diabetes Father   . Hyperlipidemia Sister   . Diabetes Sister   . Stroke Brother   . Diabetes Brother   .  Colon polyps Neg Hx   . Esophageal cancer Neg Hx   . Stomach cancer Neg Hx   . Rectal cancer Neg Hx   . Colon cancer Neg Hx    Past Surgical History:  Procedure Laterality Date  . ANTERIOR CERVICAL CORPECTOMY    . ANTERIOR CERVICAL CORPECTOMY  12/2014   for infection. this was in [place]  . CATARACT EXTRACTION     both eyes  . COLOSTOMY REVERSAL      had infection on buttocks and had to have a skin graft- had colostomy to help area stay clean and heal  . HERNIA REPAIR    . SPINE SURGERY     Social History   Social History Narrative   Retired. Lives alone.      Objective: Vital Signs: BP 126/65 (BP Location: Left Arm, Patient Position: Sitting, Cuff Size: Normal)   Pulse 69   Ht 5\' 2"  (1.575 m)   Wt 154 lb (69.9 kg)   BMI 28.17 kg/m    Physical Exam  Constitutional: He is oriented to person, place, and time. He appears well-developed and well-nourished.  HENT:  Head: Normocephalic and atraumatic.  Eyes: Pupils are equal, round, and reactive to light. Conjunctivae and EOM are normal.  Neck: Normal range of motion. Neck supple.  Cardiovascular: Normal rate, regular rhythm and normal heart sounds.   Pulmonary/Chest: Effort normal and breath sounds normal.  Abdominal: Soft. Bowel sounds are normal.  Neurological: He is alert and oriented to person, place, and time.  Skin: Skin is warm and dry. Capillary refill takes less than 2 seconds.  Psychiatric: He has a normal mood and affect. His behavior is normal.  Nursing note and vitals reviewed.    Musculoskeletal Exam: C-spine and thoracic lumbar spine limited painful range of motion. He had incomplete abduction of bilateral shoulders some discomfort in the left shoulder. Elbow joints are good range of motion. He has limited range of motion of bilateral wrists joint. He has mild extensor T no synovitis over his left wrist. No synovitis was appreciated in his wrist joints or MCPs or PIP joints. Hip joints knee joints ankles with good range of motion. He has tenderness across his MTP joints but no synovitis was noted.  CDAI Exam: CDAI Homunculus Exam:   Tenderness:  LUE: glenohumeral  Joint Counts:  CDAI Tender Joint count: 1 CDAI Swollen Joint count: 0  Global Assessments:  Patient Global Assessment: 4 Provider Global Assessment: 2  CDAI Calculated Score:  7    Investigation: No additional findings.TB Gold: canceled 06/2017 CBC Latest Ref Rng & Units 07/14/2017 06/11/2017 05/01/2017  WBC 3.8 - 10.8 K/uL 4.9 5.8 5.9  Hemoglobin 13.2 - 17.1 g/dL 15.3 14.0 14.1  Hematocrit 38.5 - 50.0 % 44.6 41.5 42.0  Platelets 140 - 400 K/uL 276 253 226   CMP Latest Ref Rng & Units 07/14/2017 06/11/2017 05/28/2017  Glucose 65 - 99 mg/dL 83 81 85  BUN 7 - 25 mg/dL 12 16 13   Creatinine 0.70 - 1.18 mg/dL 1.26(H) 1.23(H) 1.23(H)  Sodium 135 - 146 mmol/L 134(L) 139 136  Potassium 3.5 - 5.3 mmol/L 4.6 4.5 4.7  Chloride 98 - 110 mmol/L 101 105 102  CO2 20 - 31 mmol/L 23 22 23   Calcium 8.6 - 10.3 mg/dL 9.2 9.5 9.2  Total Protein 6.1 - 8.1 g/dL 7.4 7.3 -  Total Bilirubin 0.2 - 1.2 mg/dL 1.3(H) 1.1 -  Alkaline Phos 40 - 115 U/L 64 58 -  AST 10 -  35 U/L 20 19 -  ALT 9 - 46 U/L 17 16 -  07/14/2017 LDL 140, HDL 54, triglycerides 171 04/23/2017 LDL 109, HDL 50, triglycerides 130  Imaging: Xr Foot 2 Views Left  Result Date: 10/15/2017 First MTP narrowing and subluxation noted. No erosive changes were noted. DIP PIP narrowing noted. No intertarsal joint space narrowing was noted. Impression: Osteoarthritis and rheumatoid arthritis overlap.  Xr Foot 2 Views Right  Result Date: 10/15/2017 First MTP severe narrowing and subluxation noted. All other MTPs were normal. No erosive changes were noted. PIP/DIP narrowing was noted. No intertarsal joint space narrowing was noted. Impression: rheumatoid arthritis and osteoarthritis overlap without any erosive changes.  Xr Hand 2 View Left  Result Date: 10/15/2017 First and second MCP narrowing. Fifth MCP erosive changes noted. PIP/DIP narrowing was noted. Severe intercarpal and radiocarpal joint space narrowing was noted. Erosive changes and in carpal joints were noted. No radiographic progression from the previous films was noted. Impression severe rheumatoid arthritis with joint space narrowing and  erosive changes. No  radiographic progression noted.  Xr Hand 2 View Right  Result Date: 10/15/2017 First second and third MCP narrowing. All PIP/DIP narrowing. Missing second digit distal phalanx from previous injury. Severe intercarpal joint and radiocarpal joint narrowing. With erosive changes. Juxta articular osteopenia was noted. There was no change from his previous x-rays. Impression: X-ray the consistent with rheumatoid arthritis with no interval progression.   Speciality Comments: No specialty comments available.    Procedures:  No procedures performed Allergies: Patient has no known allergies.   Assessment / Plan:     Visit Diagnoses: Rheumatoid arthritis of multiple sites with negative rheumatoid factor ([place]): He had no synovitis on examination today. Although he continues to have some arthralgias. He has mild tenosynovitis in his left wrist extensor tendons.  High risk medication use - MTX0.4 mL  sq q wk week, folic acid 1 mg by mouth daily, xeljanz 5 mg by mouth twice a day. His labs are past-due. We will check labs today and then every 3 months to monitor for drug toxicity.  Elevated serum creatinine - GFR in 60s stable.  Pain in both hands: I'll obtain x-ray of bilateral hands today.  Pain in bilateral feet: Plan x-ray bilateral feet 2 views.  Chronic left shoulder pain: Given him some shoulder joint exercises.  DDD (degenerative disc disease), cervical: Some discomfort with range of motion  DDD (degenerative disc disease), lumbar: His lower back is doing better currently.  Dyslipidemia - elevated LDL and triglycerides. Patient is on Lipitor.  Vitamin D deficiency: Advised vitamin D supplement.  His other medical problems are listed as follows:  History of gastric ulcer  History of colonic polyps  History of COPD  Asymptomatic microscopic hematuria  History of BPH  Chronic left shoulder pain  Long term methotrexate user - Plan: CBC with Differential/Platelet, COMPLETE  METABOLIC PANEL WITH GFR    Orders: Orders Placed This Encounter  Procedures  . XR Hand 2 View Right  . XR Hand 2 View Left  . XR Foot 2 Views Right  . XR Foot 2 Views Left   No orders of the defined types were placed in this encounter.   Face-to-face time spent with patient was 30 minutes. Greater than 50% of time was spent in counseling and coordination of care.  Follow-Up Instructions: Return in about 3 months (around 01/15/2018) for Rheumatoid arthritis.   Bo Merino, MD  Note - This record has been created using Editor, commissioning.  Chart creation errors have been sought, but may not always  have been located. Such creation errors do not reflect on  the standard of medical care.

## 2017-10-15 ENCOUNTER — Encounter: Payer: Self-pay | Admitting: Rheumatology

## 2017-10-15 ENCOUNTER — Ambulatory Visit (INDEPENDENT_AMBULATORY_CARE_PROVIDER_SITE_OTHER): Payer: Medicare Other

## 2017-10-15 ENCOUNTER — Ambulatory Visit (INDEPENDENT_AMBULATORY_CARE_PROVIDER_SITE_OTHER): Payer: Medicare Other | Admitting: Rheumatology

## 2017-10-15 ENCOUNTER — Ambulatory Visit (INDEPENDENT_AMBULATORY_CARE_PROVIDER_SITE_OTHER): Payer: Self-pay

## 2017-10-15 VITALS — BP 126/65 | HR 69 | Ht 62.0 in | Wt 154.0 lb

## 2017-10-15 DIAGNOSIS — Z8709 Personal history of other diseases of the respiratory system: Secondary | ICD-10-CM | POA: Diagnosis not present

## 2017-10-15 DIAGNOSIS — M79641 Pain in right hand: Secondary | ICD-10-CM

## 2017-10-15 DIAGNOSIS — M79671 Pain in right foot: Secondary | ICD-10-CM

## 2017-10-15 DIAGNOSIS — M5136 Other intervertebral disc degeneration, lumbar region: Secondary | ICD-10-CM

## 2017-10-15 DIAGNOSIS — E785 Hyperlipidemia, unspecified: Secondary | ICD-10-CM

## 2017-10-15 DIAGNOSIS — M79672 Pain in left foot: Secondary | ICD-10-CM

## 2017-10-15 DIAGNOSIS — R3121 Asymptomatic microscopic hematuria: Secondary | ICD-10-CM

## 2017-10-15 DIAGNOSIS — M5030 Other cervical disc degeneration, unspecified cervical region: Secondary | ICD-10-CM

## 2017-10-15 DIAGNOSIS — M79642 Pain in left hand: Secondary | ICD-10-CM | POA: Diagnosis not present

## 2017-10-15 DIAGNOSIS — Z87438 Personal history of other diseases of male genital organs: Secondary | ICD-10-CM

## 2017-10-15 DIAGNOSIS — G8929 Other chronic pain: Secondary | ICD-10-CM

## 2017-10-15 DIAGNOSIS — M25512 Pain in left shoulder: Secondary | ICD-10-CM

## 2017-10-15 DIAGNOSIS — Z8719 Personal history of other diseases of the digestive system: Secondary | ICD-10-CM | POA: Diagnosis not present

## 2017-10-15 DIAGNOSIS — Z8711 Personal history of peptic ulcer disease: Secondary | ICD-10-CM

## 2017-10-15 DIAGNOSIS — Z79899 Other long term (current) drug therapy: Secondary | ICD-10-CM | POA: Diagnosis not present

## 2017-10-15 DIAGNOSIS — E559 Vitamin D deficiency, unspecified: Secondary | ICD-10-CM

## 2017-10-15 DIAGNOSIS — M0609 Rheumatoid arthritis without rheumatoid factor, multiple sites: Secondary | ICD-10-CM | POA: Diagnosis not present

## 2017-10-15 DIAGNOSIS — Z79631 Long term (current) use of antimetabolite agent: Secondary | ICD-10-CM

## 2017-10-15 DIAGNOSIS — Z86010 Personal history of colonic polyps: Secondary | ICD-10-CM | POA: Diagnosis not present

## 2017-10-15 DIAGNOSIS — R7989 Other specified abnormal findings of blood chemistry: Secondary | ICD-10-CM | POA: Diagnosis not present

## 2017-10-15 LAB — COMPLETE METABOLIC PANEL WITH GFR
AG Ratio: 1.3 (calc) (ref 1.0–2.5)
ALT: 27 U/L (ref 9–46)
AST: 25 U/L (ref 10–35)
Albumin: 4.2 g/dL (ref 3.6–5.1)
Alkaline phosphatase (APISO): 56 U/L (ref 40–115)
BUN/Creatinine Ratio: 14 (calc) (ref 6–22)
BUN: 18 mg/dL (ref 7–25)
CO2: 26 mmol/L (ref 20–32)
Calcium: 9.2 mg/dL (ref 8.6–10.3)
Chloride: 103 mmol/L (ref 98–110)
Creat: 1.25 mg/dL — ABNORMAL HIGH (ref 0.70–1.18)
GFR, Est African American: 65 mL/min/{1.73_m2} (ref >=60)
GFR, Est Non African American: 56 mL/min/{1.73_m2} — ABNORMAL LOW (ref >=60)
Globulin: 3.2 g/dL (calc) (ref 1.9–3.7)
Glucose, Bld: 98 mg/dL (ref 65–99)
Potassium: 4.8 mmol/L (ref 3.5–5.3)
Sodium: 136 mmol/L (ref 135–146)
Total Bilirubin: 0.9 mg/dL (ref 0.2–1.2)
Total Protein: 7.4 g/dL (ref 6.1–8.1)

## 2017-10-15 LAB — CBC WITH DIFFERENTIAL/PLATELET
Basophils Absolute: 18 cells/uL (ref 0–200)
Basophils Relative: 0.4 %
Eosinophils Absolute: 32 cells/uL (ref 15–500)
Eosinophils Relative: 0.7 %
HCT: 43.1 % (ref 38.5–50.0)
Hemoglobin: 14.7 g/dL (ref 13.2–17.1)
Lymphs Abs: 806 cells/uL — ABNORMAL LOW (ref 850–3900)
MCH: 29.1 pg (ref 27.0–33.0)
MCHC: 34.1 g/dL (ref 32.0–36.0)
MCV: 85.3 fL (ref 80.0–100.0)
MPV: 10.2 fL (ref 7.5–12.5)
Monocytes Relative: 10.4 %
Neutro Abs: 3177 cells/uL (ref 1500–7800)
Neutrophils Relative %: 70.6 %
Platelets: 254 10*3/uL (ref 140–400)
RBC: 5.05 10*6/uL (ref 4.20–5.80)
RDW: 17.2 % — ABNORMAL HIGH (ref 11.0–15.0)
Total Lymphocyte: 17.9 %
WBC mixed population: 468 cells/uL (ref 200–950)
WBC: 4.5 10*3/uL (ref 3.8–10.8)

## 2017-10-15 NOTE — Patient Instructions (Addendum)
Standing Labs We placed an order today for your standing lab work.    Please come back and get your standing labs in January and every 3 months  No appointment is necessary.   Labs are drawn by [facility].  You may receive a bill from [facility] for your lab work. If you have any questions regarding directions or hours of operation,  please call [phone].     Shoulder Exercises Ask your health care provider which exercises are safe for you. Do exercises exactly as told by your health care provider and adjust them as directed. It is normal to feel mild stretching, pulling, tightness, or discomfort as you do these exercises, but you should stop right away if you feel sudden pain or your pain gets worse.Do not begin these exercises until told by your health care provider. RANGE OF MOTION EXERCISES These exercises warm up your muscles and joints and improve the movement and flexibility of your shoulder. These exercises also help to relieve pain, numbness, and tingling. These exercises involve stretching your injured shoulder directly. Exercise A: Pendulum  1. Stand near a wall or a surface that you can hold onto for balance. 2. Bend at the waist and let your left / right arm hang straight down. Use your other arm to support you. Keep your back straight and do not lock your knees. 3. Relax your left / right arm and shoulder muscles, and move your hips and your trunk so your left / right arm swings freely. Your arm should swing because of the motion of your body, not because you are using your arm or shoulder muscles. 4. Keep moving your body so your arm swings in the following directions, as told by your health care provider: ? Side to side. ? Forward and backward. ? In clockwise and counterclockwise circles. 5. Continue each motion for __________ seconds, or for as long as told by your health care provider. 6. Slowly return to the starting position. Repeat __________ times. Complete this  exercise __________ times a day. Exercise B:Flexion, Standing  1. Stand and hold a broomstick, a cane, or a similar object. Place your hands a little more than shoulder-width apart on the object. Your left / right hand should be palm-up, and your other hand should be palm-down. 2. Keep your elbow straight and keep your shoulder muscles relaxed. Push the stick down with your healthy arm to raise your left / right arm in front of your body, and then over your head until you feel a stretch in your shoulder. ? Avoid shrugging your shoulder while you raise your arm. Keep your shoulder blade tucked down toward the middle of your back. 3. Hold for __________ seconds. 4. Slowly return to the starting position. Repeat __________ times. Complete this exercise __________ times a day. Exercise C: Abduction, Standing 1. Stand and hold a broomstick, a cane, or a similar object. Place your hands a little more than shoulder-width apart on the object. Your left / right hand should be palm-up, and your other hand should be palm-down. 2. While keeping your elbow straight and your shoulder muscles relaxed, push the stick across your body toward your left / right side. Raise your left / right arm to the side of your body and then over your head until you feel a stretch in your shoulder. ? Do not raise your arm above shoulder height, unless your health care provider tells you to do that. ? Avoid shrugging your shoulder while you raise your arm.  Keep your shoulder blade tucked down toward the middle of your back. 3. Hold for __________ seconds. 4. Slowly return to the starting position. Repeat __________ times. Complete this exercise __________ times a day. Exercise D:Internal Rotation  1. Place your left / right hand behind your back, palm-up. 2. Use your other hand to dangle an exercise band, a towel, or a similar object over your shoulder. Grasp the band with your left / right hand so you are holding onto both  ends. 3. Gently pull up on the band until you feel a stretch in the front of your left / right shoulder. ? Avoid shrugging your shoulder while you raise your arm. Keep your shoulder blade tucked down toward the middle of your back. 4. Hold for __________ seconds. 5. Release the stretch by letting go of the band and lowering your hands. Repeat __________ times. Complete this exercise __________ times a day. STRETCHING EXERCISES These exercises warm up your muscles and joints and improve the movement and flexibility of your shoulder. These exercises also help to relieve pain, numbness, and tingling. These exercises are done using your healthy shoulder to help stretch the muscles of your injured shoulder. Exercise E: Warehouse manager (External Rotation and Abduction)  1. Stand in a doorway with one of your feet slightly in front of the other. This is called a staggered stance. If you cannot reach your forearms to the door frame, stand facing a corner of a room. 2. Choose one of the following positions as told by your health care provider: ? Place your hands and forearms on the door frame above your head. ? Place your hands and forearms on the door frame at the height of your head. ? Place your hands on the door frame at the height of your elbows. 3. Slowly move your weight onto your front foot until you feel a stretch across your chest and in the front of your shoulders. Keep your head and chest upright and keep your abdominal muscles tight. 4. Hold for __________ seconds. 5. To release the stretch, shift your weight to your back foot. Repeat __________ times. Complete this stretch __________ times a day. Exercise F:Extension, Standing 1. Stand and hold a broomstick, a cane, or a similar object behind your back. ? Your hands should be a little wider than shoulder-width apart. ? Your palms should face away from your back. 2. Keeping your elbows straight and keeping your shoulder muscles relaxed,  move the stick away from your body until you feel a stretch in your shoulder. ? Avoid shrugging your shoulders while you move the stick. Keep your shoulder blade tucked down toward the middle of your back. 3. Hold for __________ seconds. 4. Slowly return to the starting position. Repeat __________ times. Complete this exercise __________ times a day. STRENGTHENING EXERCISES These exercises build strength and endurance in your shoulder. Endurance is the ability to use your muscles for a long time, even after they get tired. Exercise G:External Rotation  1. Sit in a stable chair without armrests. 2. Secure an exercise band at elbow height on your left / right side. 3. Place a soft object, such as a folded towel or a small pillow, between your left / right upper arm and your body to move your elbow a few inches away (about 10 cm) from your side. 4. Hold the end of the band so it is tight and there is no slack. 5. Keeping your elbow pressed against the soft object, move your left /  right forearm out, away from your abdomen. Keep your body steady so only your forearm moves. 6. Hold for __________ seconds. 7. Slowly return to the starting position. Repeat __________ times. Complete this exercise __________ times a day. Exercise H:Shoulder Abduction  1. Sit in a stable chair without armrests, or stand. 2. Hold a __________ weight in your left / right hand, or hold an exercise band with both hands. 3. Start with your arms straight down and your left / right palm facing in, toward your body. 4. Slowly lift your left / right hand out to your side. Do not lift your hand above shoulder height unless your health care provider tells you that this is safe. ? Keep your arms straight. ? Avoid shrugging your shoulder while you do this movement. Keep your shoulder blade tucked down toward the middle of your back. 5. Hold for __________ seconds. 6. Slowly lower your arm, and return to the starting  position. Repeat __________ times. Complete this exercise __________ times a day. Exercise I:Shoulder Extension 1. Sit in a stable chair without armrests, or stand. 2. Secure an exercise band to a stable object in front of you where it is at shoulder height. 3. Hold one end of the exercise band in each hand. Your palms should face each other. 4. Straighten your elbows and lift your hands up to shoulder height. 5. Step back, away from the secured end of the exercise band, until the band is tight and there is no slack. 6. Squeeze your shoulder blades together as you pull your hands down to the sides of your thighs. Stop when your hands are straight down by your sides. Do not let your hands go behind your body. 7. Hold for __________ seconds. 8. Slowly return to the starting position. Repeat __________ times. Complete this exercise __________ times a day. Exercise J:Standing Shoulder Row 1. Sit in a stable chair without armrests, or stand. 2. Secure an exercise band to a stable object in front of you so it is at waist height. 3. Hold one end of the exercise band in each hand. Your palms should be in a thumbs-up position. 4. Bend each of your elbows to an "L" shape (about 90 degrees) and keep your upper arms at your sides. 5. Step back until the band is tight and there is no slack. 6. Slowly pull your elbows back behind you. 7. Hold for __________ seconds. 8. Slowly return to the starting position. Repeat __________ times. Complete this exercise __________ times a day. Exercise K:Shoulder Press-Ups  1. Sit in a stable chair that has armrests. Sit upright, with your feet flat on the floor. 2. Put your hands on the armrests so your elbows are bent and your fingers are pointing forward. Your hands should be about even with the sides of your body. 3. Push down on the armrests and use your arms to lift yourself off of the chair. Straighten your elbows and lift yourself up as much as you  comfortably can. ? Move your shoulder blades down, and avoid letting your shoulders move up toward your ears. ? Keep your feet on the ground. As you get stronger, your feet should support less of your body weight as you lift yourself up. 4. Hold for __________ seconds. 5. Slowly lower yourself back into the chair. Repeat __________ times. Complete this exercise __________ times a day. Exercise L: Wall Push-Ups  1. Stand so you are facing a stable wall. Your feet should be about one arm-length  away from the wall. 2. Lean forward and place your palms on the wall at shoulder height. 3. Keep your feet flat on the floor as you bend your elbows and lean forward toward the wall. 4. Hold for __________ seconds. 5. Straighten your elbows to push yourself back to the starting position. Repeat __________ times. Complete this exercise __________ times a day. This information is not intended to replace advice given to you by your health care provider. Make sure you discuss any questions you have with your health care provider. Document Released: 10/23/2005 Document Revised: 09/02/2016 Document Reviewed: 08/20/2015 Elsevier Interactive Patient Education  2018 [employer].

## 2017-10-15 NOTE — Progress Notes (Signed)
stable °

## 2017-10-22 ENCOUNTER — Other Ambulatory Visit: Payer: Self-pay | Admitting: Family Medicine

## 2017-10-22 DIAGNOSIS — E7800 Pure hypercholesterolemia, unspecified: Secondary | ICD-10-CM

## 2017-10-22 NOTE — Telephone Encounter (Signed)
Pt has not come back for his cholesterol recheck. He will come in tomorrow and then once we get results we can refill it. He does have a few days left on his lipitor

## 2017-10-23 ENCOUNTER — Telehealth: Payer: Self-pay | Admitting: Family Medicine

## 2017-10-23 ENCOUNTER — Other Ambulatory Visit: Payer: Medicare Other

## 2017-10-23 DIAGNOSIS — E7800 Pure hypercholesterolemia, unspecified: Secondary | ICD-10-CM

## 2017-10-23 LAB — LIPID PANEL
Cholesterol: 156 mg/dL (ref <200)
HDL: 61 mg/dL (ref >40)
LDL Cholesterol (Calc): 76 mg/dL (calc)
Non-HDL Cholesterol (Calc): 95 mg/dL (calc) (ref <130)
Total CHOL/HDL Ratio: 2.6 (calc) (ref <5.0)
Triglycerides: 104 mg/dL (ref <150)

## 2017-10-23 NOTE — Telephone Encounter (Signed)
Please let him know that I am out of the office and will review this next week and get back to him. I assume it is on my desk. Thanks.

## 2017-10-23 NOTE — Telephone Encounter (Signed)
Pt came in today for labs.  He also dropped off his [facility] Screening.  He was concerned about some of the things it said.  Please review and advise him.

## 2017-10-27 ENCOUNTER — Other Ambulatory Visit: Payer: Self-pay | Admitting: Family Medicine

## 2017-10-27 ENCOUNTER — Telehealth: Payer: Self-pay | Admitting: Family Medicine

## 2017-10-27 DIAGNOSIS — E7800 Pure hypercholesterolemia, unspecified: Secondary | ICD-10-CM

## 2017-10-27 NOTE — Telephone Encounter (Signed)
Pt called and was informed of recent labs results. He states he needs a refill on the atorvastatin. Please sent to [facility].

## 2017-10-27 NOTE — Telephone Encounter (Signed)
On your desk and he was advised you are out of office when he dropped it off.

## 2017-10-27 NOTE — Telephone Encounter (Signed)
Please refill.

## 2017-10-28 ENCOUNTER — Encounter: Payer: Self-pay | Admitting: Internal Medicine

## 2017-10-28 ENCOUNTER — Ambulatory Visit (INDEPENDENT_AMBULATORY_CARE_PROVIDER_SITE_OTHER): Payer: Medicare Other | Admitting: Internal Medicine

## 2017-10-28 VITALS — BP 122/66 | HR 79 | Ht 62.0 in | Wt 153.8 lb

## 2017-10-28 DIAGNOSIS — J439 Emphysema, unspecified: Secondary | ICD-10-CM

## 2017-10-28 DIAGNOSIS — R0789 Other chest pain: Secondary | ICD-10-CM

## 2017-10-28 MED ORDER — ATORVASTATIN CALCIUM 10 MG PO TABS: 10.0000 mg | ORAL_TABLET | Freq: Every day | ORAL | 5 refills | Status: DC

## 2017-10-28 MED ORDER — BUDESONIDE 180 MCG/ACT IN AEPB: 1.0000 | INHALATION_SPRAY | Freq: Every day | RESPIRATORY_TRACT | 0 refills | Status: DC

## 2017-10-28 NOTE — Patient Instructions (Addendum)
ICD-10-CM   1. Pulmonary emphysema, unspecified emphysema type ([place]) J43.9   2. Feeling of chest tightness, unclear cause, ? due to RA R07.89    Pulmonary emphysema, unspecified emphysema type ([facility]) - stable but you are having some cough issues - continue bevespi - add pulmicort samples for 1-2 months and see if this helps with couhg - retest to qualify for night o2 - test ono on room air  Feeling of chest tightness, unclear cause, ? due to RA - will continue to keep an eye on this - cards evaluation was unrevealing and so was CT chest dec 2017   Followup 6  months or sooner if needed; might consider CT at followup

## 2017-10-28 NOTE — Progress Notes (Signed)
Subjective:     Patient ID: Tony Mcmillan, male   DOB: 1941/03/11, 76 y.o.   MRN: [id number]  HPI   OV 02/21/2017  Chief Complaint  Patient presents with  . Follow-up    Pt states his breathing is unchanged. Pt c/o right sided chest tightness that is persistent  - since 11/2016. Pt c/o prod cough with clear mucus.     76 year old African-American male with RA on methotrexate. Has dyspnea on exertion. Workup over the last few months as showing emphysema with isolated reduction in diffusion capacity. He's been started on Spiriva and nocturnal oxygen. He returns for follow-up after institution of these therapies. He tells me that he's not sure if these therapies have helped. He thinks it might have helped. However he is reporting new onset in the last month or 2 but definitely postdating starting of the Spiriva therapy is constant chest tightness on the right breast area. There is no clear cut aggravating factor or relieving factor. It is not a pain but it as a chest tightness. This no radiation. It is mild to moderate but definitely annoying. This no fever or chills or wheezing is definitely seeking relief for this.     03/24/2017 Follow Up Appointment: Pt. Presents today for follow up. He was seen by Dr. Chase Caller 02/25/2017. He had been switched from Spiriva to [facility] for continued chest tightness.  Pt. States he does not really notice much of a change. He still is experiencing  some very mild chest tightness every now and again.There is no patten to the chest tightness.He states it is not bad chest tightness and that it self resolves.He was seen by cardiology 01/2017 normal ECG, and previously normal ischemic evaluation. They signed off for follow up as needed. He is agreeable to continuing on the [place] as he has notes a slight improvement. We did not start a rescue inhaler as there was no BD response on PFT. He denies any chest pain, fever, orthopnea or hemoptysis. Minimal secretions with cough.He has  added calcium and Vitamin D3 as treatment to his medication regimen while on methotrexate. He states he is complaint with his nocturnal oxygen.Saturations today are 94% on RA today.He is compliant with his Pulmonary rehab, and feels he is benefiting from treatment.  Tests 2-D echo October 2017 showed grade 1 diastolic dysfunction. Cardiac stress test negative in October 2017 Walk test in the office December 2017 did not show any type of desaturation. Chest x-ray October 2017 showed clear lungs High resolution CT chest 12/03/2016 showed no ILD, mild emphysema, mild diffuse bronchial wall thickening, focal area of scarring in the right upper lobe ono 11/29/16 shows pulse ox </= 88% at 46 min. Start 1L [place]  Pulmonary function test done 11/26/2016 showed an FEV1 at 74%, ratio 82, FVC 65%, no bronchodilator response, ERV 138%, DLCO 53%  OV 07/01/2017  Chief Complaint  Patient presents with  . Follow-up    Pt states he has right sided chest tightness that is constant. Pt denies significant cough, f/c/s, and SOB.    Follow-up rheumatoid arthritis patient with emphysema and associated right-sided chest tightness  This is a routine follow-up. Last summer nurse partition April 2018. He is on dual inhaler therapy scheduled long-acting beta agonist and anticholinergic may by [employer]. This is working well for him. He is stable in terms of dyspnea. In fact is no dyspnea. He is attending pulmonary rehabilitation and has no problems. His main issues that he still continues to have moderate amount  of right-sided chest tightness between the right breast and the right sternal area. The circular area around 5 or 7 cm in size. The constant feeling of tightness. Is moderate in severity. Present for several months without any progression. No radiation. No diaphoresis. He is frustrated by it. The inhalers are not helping.   OV 10/28/2017 Chief Complaint  Patient presents with  . Follow-up    Pt here due to  [place] stating pt needing to be re-evaluated. States that he wears 1L O2 at night. States that he has occ. coughing with occ. chest tightness.    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 He wants to be followed For his nocturnal oxygen. He is stable in terms of dyspnea. He is on Bevespi which he is working well for him. He is up-to-date with his flu shot. He does have some random episodes of cough particularly when the weather [place] mother does want it is mild and manageable but he does want to add on inhaler for this. Walking desaturation to as 185 feet 3 laps on room air: Resting pulse ox 100%. Final pulse ox 96%. Resting heart rate 87/m. Final heart rate 114/m.  Unexplained right-sided chest tightness: This persists without change    has a past medical history of Anemia, Atypical chest pain, Blood transfusion without reported diagnosis, Diastolic dysfunction, Emphysema of lung ([place]) (02/08/2016), GERD (gastroesophageal reflux disease), and Rheumatoid arthritis ([place]).   reports that he quit smoking about 23 years ago. His smoking use included cigarettes. He has a 17.00 pack-year smoking history. he has never used smokeless tobacco.  Past Surgical History:  Procedure Laterality Date  . ANTERIOR CERVICAL CORPECTOMY    . ANTERIOR CERVICAL CORPECTOMY  12/2014   for infection. this was in [place]  . CATARACT EXTRACTION     both eyes  . COLOSTOMY REVERSAL     had infection on buttocks and had to have a skin graft- had colostomy to help area stay clean and heal  . HERNIA REPAIR    . SPINE SURGERY      No Known Allergies  Immunization History  Administered Date(s) Administered  . Influenza, High Dose Seasonal PF 07/23/2017  . Influenza-Unspecified 10/30/2015, 09/13/2016  . Pneumococcal Conjugate-13 12/05/2015  . Pneumococcal Polysaccharide-23 04/23/2017  . Tdap  12/09/2015  . Zoster 12/09/2015    Family History  Problem Relation Age of Onset  . Hypertension Mother   . Hypertension Father   . Diabetes Father   . Hyperlipidemia Sister   . Diabetes Sister   . Stroke Brother   . Diabetes Brother   . Colon polyps Neg Hx   . Esophageal cancer Neg Hx   . Stomach cancer Neg Hx   . Rectal cancer Neg Hx   . Colon cancer Neg Hx      Current Outpatient Medications:  .  atorvastatin (LIPITOR) 10 MG tablet, Take 1 tablet (10 mg total) daily by mouth., Disp: 30 tablet, Rfl: 5 .  B-D TB SYRINGE 1CC/27GX1/2" 27G X 1/2" 1 ML MISC, Inject 4 mg as directed 1 day or 1 dose., Disp: , Rfl: 3 .  BEVESPI AEROSPHERE 9-4.8 MCG/ACT AERO, INHALE 2 PUFFS INTO THE LUNGS 2 (TWO) TIMES DAILY., Disp: 10.7 Inhaler, Rfl: 5 .  Calcium Carb-Cholecalciferol (CALCIUM-VITAMIN D3) 600-400 MG-UNIT TABS, Take by mouth., Disp: , Rfl:  .  folic acid (FOLVITE) 1 MG tablet, Take 2 mg by mouth  daily., Disp: , Rfl:  .  methotrexate 50 MG/2ML injection, INJECT 0.4 ML (10 MG TOTAL) UNDER THE SKIN ONCE A WEEK, Disp: 6 mL, Rfl: 0 .  Tuberculin-Allergy Syringes 27G X 1/2" 1 ML KIT, Inject 1 Syringe into the skin once a week. To be used with weekly methotrexate injections., Disp: 12 each, Rfl: 3 .  XELJANZ 5 MG TABS, TAKE 1 TABLET (5 MG) TWICE A DAY, Disp: 60 tablet, Rfl: 1    Review of Systems     Objective:   Physical Exam  Constitutional: He is oriented to person, place, and time. He appears well-developed and well-nourished. No distress.  HENT:  Head: Normocephalic and atraumatic.  Right Ear: External ear normal.  Left Ear: External ear normal.  Mouth/Throat: Oropharynx is clear and moist. No oropharyngeal exudate.  Eyes: Conjunctivae and EOM are normal. Pupils are equal, round, and reactive to light. Right eye exhibits no discharge. Left eye exhibits no discharge. No scleral icterus.  Neck: Normal range of motion. Neck supple. No JVD present. No tracheal deviation present. No  thyromegaly present.  Cardiovascular: Normal rate, regular rhythm and intact distal pulses. Exam reveals no gallop and no friction rub.  No murmur heard. Pulmonary/Chest: Effort normal and breath sounds normal. No respiratory distress. He has no wheezes. He has no rales. He exhibits no tenderness.  Abdominal: Soft. Bowel sounds are normal. He exhibits no distension and no mass. There is no tenderness. There is no rebound and no guarding.  Musculoskeletal: Normal range of motion. He exhibits no edema or tenderness.  Lymphadenopathy:    He has no cervical adenopathy.  Neurological: He is alert and oriented to person, place, and time. He has normal reflexes. No cranial nerve deficit. Coordination normal.  Skin: Skin is warm and dry. No rash noted. He is not diaphoretic. No erythema. No pallor.  Psychiatric: He has a normal mood and affect. His behavior is normal. Judgment and thought content normal.  Nursing note and vitals reviewed.   Vitals:   10/28/17 1051  BP: 122/66  Pulse: 79  SpO2: 92%  Weight: 153 lb 12.8 oz (69.8 kg)  Height: 5' 2"  (1.575 m)   Estimated body mass index is 28.13 kg/m as calculated from the following:   Height as of this encounter: 5' 2"  (1.575 m).   Weight as of this encounter: 153 lb 12.8 oz (69.8 kg).       Assessment:       ICD-10-CM   1. Pulmonary emphysema, unspecified emphysema type ([place]) J43.9   2. Feeling of chest tightness, unclear cause, ? due to RA R07.89        Plan:      Pulmonary emphysema, unspecified emphysema type ([facility]) - stable but you are having some cough issues - continue bevespi - add pulmicort samples for 1-2 months and see if this helps with couhg - retest to qualify for night o2 - test ono on room air  Feeling of chest tightness, unclear cause, ? due to RA - will continue to keep an eye on this - cards evaluation was unrevealing and so was CT chest dec 2017   Followup 6  months or sooner if needed; might consider CT  at followup    Dr. Brand Males, M.D., [facility].C.P Pulmonary and Critical Care Medicine Staff Physician [place] [facility] Pager: [phone], If no answer or between  15:00h - 7:00h: call [phone]  10/28/2017 11:37 AM

## 2017-10-28 NOTE — Telephone Encounter (Signed)
done

## 2017-10-30 ENCOUNTER — Encounter: Payer: Self-pay | Admitting: Family Medicine

## 2017-10-30 ENCOUNTER — Ambulatory Visit (INDEPENDENT_AMBULATORY_CARE_PROVIDER_SITE_OTHER): Payer: Medicare Other | Admitting: Family Medicine

## 2017-10-30 VITALS — BP 122/60 | HR 70 | Resp 16 | Wt 154.2 lb

## 2017-10-30 DIAGNOSIS — E559 Vitamin D deficiency, unspecified: Secondary | ICD-10-CM | POA: Diagnosis not present

## 2017-10-30 DIAGNOSIS — M47816 Spondylosis without myelopathy or radiculopathy, lumbar region: Secondary | ICD-10-CM | POA: Diagnosis not present

## 2017-10-30 DIAGNOSIS — Z92241 Personal history of systemic steroid therapy: Secondary | ICD-10-CM

## 2017-10-30 DIAGNOSIS — M5136 Other intervertebral disc degeneration, lumbar region: Secondary | ICD-10-CM | POA: Diagnosis not present

## 2017-10-30 DIAGNOSIS — R748 Abnormal levels of other serum enzymes: Secondary | ICD-10-CM | POA: Diagnosis not present

## 2017-10-30 DIAGNOSIS — Z79899 Other long term (current) drug therapy: Secondary | ICD-10-CM | POA: Diagnosis not present

## 2017-10-30 DIAGNOSIS — M0609 Rheumatoid arthritis without rheumatoid factor, multiple sites: Secondary | ICD-10-CM | POA: Diagnosis not present

## 2017-10-30 DIAGNOSIS — M47812 Spondylosis without myelopathy or radiculopathy, cervical region: Secondary | ICD-10-CM

## 2017-10-30 HISTORY — DX: Personal history of systemic steroid therapy: Z92.241

## 2017-10-30 NOTE — Patient Instructions (Signed)
Call and schedule your bone density test.   We will call you with your results.

## 2017-10-30 NOTE — Progress Notes (Signed)
   Subjective:    Patient ID: Tony Mcmillan, male    DOB: July 09, 1941, 76 y.o.   MRN: [id number]  HPI Chief Complaint  Patient presents with  . Advice Only    pt reports he has been to the lung doctor- c/o continued chest tightness.    He is here to discuss abnormal results from a Life Line Screening test. He has a history of RA, COPD, former smoker and history of steroid use but not known for how long. He is at risk for osteoporosis. Apparently he has not had a bone density test. No known fracture.  Current treatment for RA is methotrexate and Xeljanz. This is being managed by his rheumatologist, Dr. Estanislado Pandy.  COPD, emphysema, is being managed by Dr. Chase Caller. Steroid inhaler use. Oxygen use at night.  Recent liver enzymes mildly elevated. Unknown etiology. He does not drink alcohol.   Vitamin D def. Taking 400 IU daily.   Denies fever, chills, dizziness, chest pain, palpitations, shortness of breath, cough, abdominal pain, N/V/D.   Reviewed allergies, medications, past medical, surgical, and social history.   Review of Systems Pertinent positives and negatives in the history of present illness.     Objective:   Physical Exam BP 122/60   Pulse 70   Resp 16   Wt 154 lb 3.2 oz (69.9 kg)   SpO2 94%   BMI 28.20 kg/m   Alert and oriented and in no acute distress. Not otherwise examined.      Assessment & Plan:  Rheumatoid arthritis of multiple sites with negative rheumatoid factor (HCC) - Plan: DG Bone Density  Spondylosis of lumbar region without myelopathy or radiculopathy  Osteoarthritis of cervical spine, unspecified spinal osteoarthritis complication status - Plan: DG Bone Density  DDD (degenerative disc disease), lumbar - Plan: DG Bone Density  Vitamin D deficiency - Plan: VITAMIN D 25 Hydroxy (Vit-D Deficiency, Fractures)  History of systemic steroid therapy - Plan: DG Bone Density  Medication management - Plan: VITAMIN D 25 Hydroxy (Vit-D Deficiency,  Fractures)  Elevated liver enzymes - Plan: Comprehensive metabolic panel  Reviewed results from his Life Line Screening with him.  Plan to send him for a DEXA due to multiple risk factors for osteoporosis.  Repeat liver function tests due to recently elevated liver enzymes. Discussed that Morrie Sheldon has the potential to cause elevated LFTs but we will keep an eye on this.  Will check vitamin D level since he has a history of deficiency and is currently taking a supplement.  Follow up pending results.

## 2017-10-31 LAB — COMPREHENSIVE METABOLIC PANEL
AG Ratio: 1.4 (calc) (ref 1.0–2.5)
ALT: 19 U/L (ref 9–46)
AST: 20 U/L (ref 10–35)
Albumin: 3.9 g/dL (ref 3.6–5.1)
Alkaline phosphatase (APISO): 53 U/L (ref 40–115)
BUN/Creatinine Ratio: 15 (calc) (ref 6–22)
BUN: 18 mg/dL (ref 7–25)
CO2: 26 mmol/L (ref 20–32)
Calcium: 9.2 mg/dL (ref 8.6–10.3)
Chloride: 104 mmol/L (ref 98–110)
Creat: 1.22 mg/dL — ABNORMAL HIGH (ref 0.70–1.18)
Globulin: 2.8 g/dL (calc) (ref 1.9–3.7)
Glucose, Bld: 97 mg/dL (ref 65–99)
Potassium: 4.2 mmol/L (ref 3.5–5.3)
Sodium: 137 mmol/L (ref 135–146)
Total Bilirubin: 1.2 mg/dL (ref 0.2–1.2)
Total Protein: 6.7 g/dL (ref 6.1–8.1)

## 2017-10-31 LAB — VITAMIN D 25 HYDROXY (VIT D DEFICIENCY, FRACTURES): Vit D, 25-Hydroxy: 51 ng/mL (ref 30–100)

## 2017-11-05 ENCOUNTER — Telehealth: Payer: Self-pay | Admitting: Family Medicine

## 2017-11-05 NOTE — Telephone Encounter (Signed)
Called [employer] & spoke to LeRoy.  She states she has the order & Marita Kansas is the person in their office that takes care of those.  She has already left for the day and she will have Kristy to follow up with the patient in the morning.  I called the patient & gave him that information.  He stated ok.  Nothing further needed.

## 2017-11-05 NOTE — Telephone Encounter (Signed)
Spoke with pt, he states he was supposed to have an ONO. MR did want pt to have this done and order was placed. PCC's please advise. He states his DME company is [employer].  Pulmonary emphysema, unspecified emphysema type ([place]) - stable but you are having some cough issues - continue bevespi - add pulmicort samples for 1-2 months and see if this helps with couhg - retest to qualify for night o2 - test ono on room air  Feeling of chest tightness, unclear cause, ? due to RA - will continue to keep an eye on this - cards evaluation was unrevealing and so was CT chest dec 2017   Followup 6  months or sooner if needed; might consider CT at followup

## 2017-11-08 ENCOUNTER — Other Ambulatory Visit: Payer: Self-pay | Admitting: Rheumatology

## 2017-11-10 NOTE — Telephone Encounter (Signed)
Last Visit: 10/15/17 Next Visit: 01/07/18 Labs: 10/15/17 Stable  Okay to refill per Dr. Estanislado Pandy

## 2017-11-15 ENCOUNTER — Other Ambulatory Visit: Payer: Self-pay | Admitting: Rheumatology

## 2017-11-17 ENCOUNTER — Other Ambulatory Visit: Payer: Self-pay | Admitting: Rheumatology

## 2017-11-17 MED ORDER — METHOTREXATE SODIUM CHEMO INJECTION 50 MG/2ML: 10.0000 mg | INTRAMUSCULAR | 0 refills | Status: DC

## 2017-11-17 NOTE — Telephone Encounter (Signed)
Last Visit: 10/15/17 Next Visit: 01/07/18 Labs: 10/15/17 Stable  Okay to refill per Dr. Estanislado Pandy

## 2017-11-21 ENCOUNTER — Telehealth: Payer: Self-pay | Admitting: Internal Medicine

## 2017-11-21 DIAGNOSIS — J439 Emphysema, unspecified: Secondary | ICD-10-CM | POA: Diagnosis not present

## 2017-11-21 NOTE — Telephone Encounter (Signed)
Called and spoke to Neola with [employer]. Caryl Pina states the pt had an ONO last night and that pt qualifies for nocturnal O2. Caryl Pina states the ONO will be faxed to our office. Caryl Pina requests a call back once ONO is received and if pt needs O2.   Will forward Raquel Sarna to look out for ONO.

## 2017-11-24 NOTE — Telephone Encounter (Signed)
Have received the fax of pt's ONO results. Will place it it MR's urgent folder for him to look at.

## 2017-11-25 ENCOUNTER — Telehealth: Payer: Self-pay | Admitting: Internal Medicine

## 2017-11-25 DIAGNOSIS — G4734 Idiopathic sleep related nonobstructive alveolar hypoventilation: Secondary | ICD-10-CM

## 2017-11-25 NOTE — Telephone Encounter (Signed)
ONO 11/20/17 - times < 88% @ 34 min. - start 2L [place] at night  Dr. Brand Males, M.D., [facility]C.P Pulmonary and Critical Care Medicine Staff Physician, [place] Director - Interstitial Lung Disease  Program  Pulmonary [place], [place], [address]  Pager: [phone], If no answer or between  15:00h - 7:00h: call [phone] Telephone: [phone]

## 2017-11-26 ENCOUNTER — Other Ambulatory Visit: Payer: Medicare Other

## 2017-11-26 NOTE — Telephone Encounter (Signed)
Advised pt of results. Pt understood and nothing further is needed. ]  Order for 02 placed for 2L instead of 1L

## 2017-11-26 NOTE — Telephone Encounter (Signed)
Pt returned call.  Can be reached at [phone]

## 2017-11-26 NOTE — Telephone Encounter (Signed)
Tried to call pt to give results of ONO and that we needed to order O2 at night but pt did not answer. Left message for pt to call us back x1

## 2017-11-27 ENCOUNTER — Ambulatory Visit
Admission: RE | Admit: 2017-11-27 | Discharge: 2017-11-27 | Disposition: A | Discharge status: Home / Self Care | Payer: Medicare Other | Source: Ambulatory Visit | Attending: Family Medicine | Admitting: Family Medicine

## 2017-11-27 DIAGNOSIS — M85852 Other specified disorders of bone density and structure, left thigh: Secondary | ICD-10-CM | POA: Diagnosis not present

## 2017-11-27 DIAGNOSIS — M47812 Spondylosis without myelopathy or radiculopathy, cervical region: Secondary | ICD-10-CM

## 2017-11-27 DIAGNOSIS — M0609 Rheumatoid arthritis without rheumatoid factor, multiple sites: Secondary | ICD-10-CM

## 2017-11-27 DIAGNOSIS — M5136 Other intervertebral disc degeneration, lumbar region: Secondary | ICD-10-CM

## 2017-11-27 DIAGNOSIS — Z92241 Personal history of systemic steroid therapy: Secondary | ICD-10-CM

## 2017-11-27 NOTE — Telephone Encounter (Signed)
MR please advise of the ONO results. Thanks

## 2017-12-03 ENCOUNTER — Telehealth: Payer: Self-pay

## 2017-12-03 ENCOUNTER — Telehealth: Payer: Self-pay | Admitting: Family Medicine

## 2017-12-03 MED ORDER — FOLIC ACID 1 MG PO TABS: 2.0000 mg | ORAL_TABLET | Freq: Every day | ORAL | 3 refills | Status: DC

## 2017-12-03 NOTE — Telephone Encounter (Signed)
Please call let him know that I sent his results from the bone density via my chart.  It appears that he did read this.  Let me know

## 2017-12-03 NOTE — Telephone Encounter (Signed)
Patient would like a Rx refill on Folic Acid.  Cb#[phone].  Please advise.  Thank you.

## 2017-12-03 NOTE — Telephone Encounter (Signed)
Called patient and advised of note. Patient had no concerns.

## 2017-12-03 NOTE — Telephone Encounter (Signed)
Called patient regarding the bone density results. Patient is asking if he needs to continue the current vitamin-calcium medication or if he should change it? Or take the medication and eat those in his diet as well.  Thank you!

## 2017-12-03 NOTE — Telephone Encounter (Signed)
Pt called and asked about his bone density report. Please advise pt at [phone].

## 2017-12-03 NOTE — Addendum Note (Signed)
Addended by: Carole Binning on: 12/03/2017 03:35 PM   Modules accepted: Orders

## 2017-12-03 NOTE — Telephone Encounter (Signed)
Last Visit: 10/15/17 Next Visit: 01/07/18  Okay to refill per Dr. Estanislado Pandy

## 2017-12-03 NOTE — Telephone Encounter (Signed)
MR please advise on ONO results. Thanks.

## 2017-12-03 NOTE — Telephone Encounter (Signed)
He can continue on his current supplements and eat a healthy diet.

## 2017-12-04 NOTE — Telephone Encounter (Signed)
Called pt letting him know the results of the ONO and that he qualified to wear 2L O2 at night. Pt stated that he is currently wearing O2 at night.  Nothing further needed.

## 2017-12-04 NOTE — Telephone Encounter (Signed)
Tony Mcmillan ONO 11/20/17 - pusle ox </= 34 min. STart 2L[place] at night   Dr. Brand Males, M.D., [facility].C.P Pulmonary and Critical Care Medicine Staff Physician, [place] Director - Interstitial Lung Disease  Program  Pulmonary [place] at [place], [place], [address]  Pager: [phone], If no answer or between  15:00h - 7:00h: call [phone] Telephone: [phone]

## 2017-12-04 NOTE — Telephone Encounter (Signed)
Will close encounter, already adresses on telephone note from 12/01

## 2017-12-10 ENCOUNTER — Other Ambulatory Visit: Payer: Self-pay | Admitting: Rheumatology

## 2017-12-10 NOTE — Telephone Encounter (Signed)
Last Visit: 10/15/17 Next Visit: 01/07/18  Okay to refill per Dr. Estanislado Pandy

## 2017-12-15 ENCOUNTER — Other Ambulatory Visit: Payer: Self-pay | Admitting: Rheumatology

## 2017-12-24 NOTE — Progress Notes (Signed)
Office Visit Note  Patient: Tony Mcmillan             Date of Birth: January 15, 1941           MRN: [id number]             PCP: Girtha Rm, NP-C Referring: Girtha Rm, NP-C Visit Date: 01/07/2018 Occupation: @GUAROCC @    Subjective:  Hand pain and swelling   History of Present Illness: Tony Mcmillan is a 77 y.o. male with history of seronegative rheumatoid arthritis and DDD.  He states he continues to take Xeljaz 5 mg BID, MTX 0.4 ml weekly, and folic acid 1 mg daily.  He states he has not missed any doses of medication and has not had any change in activity.  He reports increased pain and swelling in his left hand and left wrist.  He also is having bilateral shoulder pain.  He has stiffness in his neck but no discomfort at this time.  He states that he has seen Dr. Chase Caller previously for a chronic cough and right chest wall tenderness.  He continues to have right chest wall tenderness.  He would like a CXR.      Activities of Daily Living:  Patient reports morning stiffness for 1 hour.   Patient Reports nocturnal pain.  Difficulty dressing/grooming: Denies Difficulty climbing stairs: Reports Difficulty getting out of chair: Reports Difficulty using hands for taps, buttons, cutlery, and/or writing: Denies   Review of Systems  Constitutional: Negative for fatigue, night sweats and weakness.  HENT: Negative for mouth sores, mouth dryness and nose dryness.   Eyes: Positive for dryness. Negative for redness and visual disturbance.  Respiratory: Positive for cough. Negative for hemoptysis, shortness of breath and difficulty breathing.   Cardiovascular: Positive for chest pain (Right chest wall tenderness). Negative for palpitations, hypertension, irregular heartbeat and swelling in legs/feet.  Gastrointestinal: Positive for constipation. Negative for blood in stool and diarrhea.  Endocrine: Negative for increased urination.  Genitourinary: Negative for painful urination.    Musculoskeletal: Positive for arthralgias, joint pain, joint swelling and morning stiffness. Negative for myalgias, muscle weakness, muscle tenderness and myalgias.  Skin: Negative for color change, rash, hair loss, nodules/bumps, redness, skin tightness, ulcers and sensitivity to sunlight.  Allergic/Immunologic: Negative for susceptible to infections.  Neurological: Negative for dizziness, fainting, memory loss and night sweats.  Hematological: Negative for swollen glands.  Psychiatric/Behavioral: Negative for depressed mood and sleep disturbance. The patient is not nervous/anxious.     PMFS History:  Patient Active Problem List   Diagnosis Date Noted  . History of systemic steroid therapy 10/30/2017  . Feeling of chest tightness, unclear cause, ? due to RA 02/21/2017  . Pulmonary emphysema ([place]) 12/18/2016  . Dyspnea and respiratory abnormality 11/25/2016  . 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[place]) 11/04/2016  . High risk medication use 11/04/2016  . 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 Vitamin D deficiency 11/04/2016  . 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 History of anemia 07/02/2016  . History of colonic polyps 07/02/2016  . History of gastric ulcer 07/02/2016  . Asymptomatic microscopic hematuria 04/03/2016  . Benign prostatic hyperplasia 04/03/2016  . Renal cyst, left 04/03/2016  . 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 DDD (degenerative disc disease), lumbar 04/03/2016  . Colonic polyp 02/23/2016  . Duodenal ulcer 02/23/2016    Past Medical History:  Diagnosis Date  . Anemia   . Atypical chest pain    a. 09/2016 MV: EF 63%, normal  perfusion.  . Blood transfusion without reported diagnosis   . Diastolic dysfunction    a. 2015 Echo: EF >55%;  b. 09/2016 Echo: EF 55-60%, no rwma, Gr1 DD, triv AI/MR, mild TR, PASP 42mmHg.  . Emphysema of lung ([place]) 02/08/2016  . GERD (gastroesophageal reflux disease)   . History of systemic steroid  therapy 10/30/2017  . Rheumatoid arthritis ([place])     Family History  Problem Relation Age of Onset  . Hypertension Mother   . Hypertension Father   . Diabetes Father   . Hyperlipidemia Sister   . Diabetes Sister   . Stroke Brother   . Diabetes Brother   . Colon polyps Neg Hx   . Esophageal cancer Neg Hx   . Stomach cancer Neg Hx   . Rectal cancer Neg Hx   . Colon cancer Neg Hx    Past Surgical History:  Procedure Laterality Date  . ANTERIOR CERVICAL CORPECTOMY    . ANTERIOR CERVICAL CORPECTOMY  12/2014   for infection. this was in [place]  . CATARACT EXTRACTION     both eyes  . COLOSTOMY REVERSAL     had infection on buttocks and had to have a skin graft- had colostomy to help area stay clean and heal  . HERNIA REPAIR    . SPINE SURGERY     Social History   Social History Narrative   Retired. Lives alone.      Objective: Vital Signs: BP 126/75 (BP Location: Left Arm, Patient Position: Sitting, Cuff Size: Normal)   Pulse 74   Resp 18   Ht 5\' 2"  (1.575 m)   Wt 158 lb (71.7 kg)   BMI 28.90 kg/m    Physical Exam  Constitutional: He is oriented to person, place, and time. He appears well-developed and well-nourished.  HENT:  Head: Normocephalic and atraumatic.  Eyes: Conjunctivae and EOM are normal. Pupils are equal, round, and reactive to light.  Neck: Normal range of motion. Neck supple.  Left occipital lymph node swelling noted  Cardiovascular: Normal rate, regular rhythm and normal heart sounds.  Pulmonary/Chest: Effort normal and breath sounds normal.  Abdominal: Soft. Bowel sounds are normal.  Neurological: He is alert and oriented to person, place, and time.  Skin: Skin is warm and dry. Capillary refill takes less than 2 seconds.  Psychiatric: He has a normal mood and affect. His behavior is normal.  Nursing note and vitals reviewed.    Musculoskeletal Exam: C-spine very limited flexion, extension, and lateral rotation.  Thoracic and lumbar spine limited ROM.   Shoulder joints limited forward flexion with discomfort.  Mild bilateral elbow contractures. Wrist joints limited. Extensor tenosynovitis of bilateral wrists.  Left 2nd and 3rd MCP synovitis. MCPs, PIPs, and DIPs good ROM.  Hip joints, knee joints, ankle joints, MTPs, PIPs, and DIPs good ROM with no synovitis.  He walks with a cane.     CDAI Exam: CDAI Homunculus Exam:   Tenderness:  LUE: wrist Left hand: 2nd MCP, 3rd MCP, 4th MCP and 5th MCP  Joint Counts:  CDAI Tender Joint count: 5 CDAI Swollen Joint count: 0  Global Assessments:  Patient Global Assessment: 8   CDAI Calculated Score: 13    Investigation: No additional findings. CBC Latest Ref Rng & Units 10/15/2017 07/14/2017 06/11/2017  WBC 3.8 - 10.8 Thousand/uL 4.5 4.9 5.8  Hemoglobin 13.2 - 17.1 g/dL 14.7 15.3 14.0  Hematocrit 38.5 - 50.0 % 43.1 44.6 41.5  Platelets 140 - 400 Thousand/uL 254 276 253  CMP Latest Ref Rng & Units 10/30/2017 10/15/2017 07/14/2017  Glucose 65 - 99 mg/dL 97 98 83  BUN 7 - 25 mg/dL 18 18 12   Creatinine 0.70 - 1.18 mg/dL 1.22(H) 1.25(H) 1.26(H)  Sodium 135 - 146 mmol/L 137 136 134(L)  Potassium 3.5 - 5.3 mmol/L 4.2 4.8 4.6  Chloride 98 - 110 mmol/L 104 103 101  CO2 20 - 32 mmol/L 26 26 23   Calcium 8.6 - 10.3 mg/dL 9.2 9.2 9.2  Total Protein 6.1 - 8.1 g/dL 6.7 7.4 7.4  Total Bilirubin 0.2 - 1.2 mg/dL 1.2 0.9 1.3(H)  Alkaline Phos 40 - 115 U/L - - 64  AST 10 - 35 U/L 20 25 20   ALT 9 - 46 U/L 19 27 17     Imaging: No results found.  Speciality Comments: No specialty comments available.    Procedures:  No procedures performed Allergies: Patient has no known allergies.    Assessment / Plan:     Visit Diagnoses: Rheumatoid arthritis of multiple sites with negative rheumatoid factor [facility]): He has extensor tenosynovitis of bilateral wrist joints.  Synovitis of 2nd and 3rd MCPs.  He had x-rays of bilateral hands and feet on 10/15/17.  The x-rays of his hands revealed severe rheumatoid  arthritis with joint space narrowing and erosive changes.  He was given a prescription for a Prednisone taper starting at 20 mg and tapering by 5 mg every 4 days.  We also discussed the potential of switching to Orencia IV or subcutaneous if he does not improve after the Prednisone taper.  He has previously failed Enbrel.  He will continue on Xeljanx 5 mg BID and MTX 0.4 ml weekly.  We cannot increase his MTX due to his elevated creatinine.    High risk medication use - MTX0.4 mL  sq q wk week, folic acid 1 mg by mouth daily, xeljanz 5 mg by mouth twice a day: Lipid panel was checked on 10/23/17 and LDL was WNL. CBC and CMP were drawn today.    Elevated serum creatinine: Creatinine on 10/30/17 was 1.22.   DDD (degenerative disc disease), cervical: He has significant limitation of neck flexion, extension, and lateral rotation.    DDD (degenerative disc disease), lumbar: Chronic pain   History of vitamin D deficiency: He takes Vitamin D and calcium.    Chest wall tenderness - He has chronic right chest wall tenderness and tightness.  he has previously been evaluated in the ED and by Dr. Chase Caller. He would like a repeat CXR.  A future order was placed. Plan: DG Chest 2 View   Dyslipidemia - Patient is on Lipitor.  Other medical conditions are listed as follows:   History of BPH  History of gastric ulcer  History of COPD - Plan: DG Chest 2 View  History of colonic polyps  Asymptomatic microscopic hematuria    Orders: Orders Placed This Encounter  Procedures  . DG Chest 2 View  . CBC with Differential/Platelet  . COMPLETE METABOLIC PANEL WITH GFR   Meds ordered this encounter  Medications  . predniSONE (DELTASONE) 5 MG tablet    Sig: Take 4 tablets for 4 days, then 3 tablets for 4 days, then 2 tablets for 4 days, and then 1 tablet for 4 days.    Dispense:  40 tablet    Refill:  0    Face-to-face time spent with patient was 30 minutes. Greater than 50% of time was spent in  counseling and coordination of care.  Follow-Up Instructions:  Return in about 3 months (around 04/07/2018) for Rheumatoid arthritis, DDD.   Bo Merino, MD  Note - This record has been created using Editor, commissioning.  Chart creation errors have been sought, but may not always  have been located. Such creation errors do not reflect on  the standard of medical care.

## 2018-01-07 ENCOUNTER — Encounter: Payer: Self-pay | Admitting: Rheumatology

## 2018-01-07 ENCOUNTER — Ambulatory Visit (INDEPENDENT_AMBULATORY_CARE_PROVIDER_SITE_OTHER): Payer: Medicare Other | Admitting: Rheumatology

## 2018-01-07 VITALS — BP 126/75 | HR 74 | Resp 18 | Ht 62.0 in | Wt 158.0 lb

## 2018-01-07 DIAGNOSIS — M0609 Rheumatoid arthritis without rheumatoid factor, multiple sites: Secondary | ICD-10-CM

## 2018-01-07 DIAGNOSIS — Z8711 Personal history of peptic ulcer disease: Secondary | ICD-10-CM

## 2018-01-07 DIAGNOSIS — M5136 Other intervertebral disc degeneration, lumbar region: Secondary | ICD-10-CM | POA: Diagnosis not present

## 2018-01-07 DIAGNOSIS — Z8719 Personal history of other diseases of the digestive system: Secondary | ICD-10-CM

## 2018-01-07 DIAGNOSIS — R7989 Other specified abnormal findings of blood chemistry: Secondary | ICD-10-CM

## 2018-01-07 DIAGNOSIS — E785 Hyperlipidemia, unspecified: Secondary | ICD-10-CM | POA: Diagnosis not present

## 2018-01-07 DIAGNOSIS — Z8639 Personal history of other endocrine, nutritional and metabolic disease: Secondary | ICD-10-CM

## 2018-01-07 DIAGNOSIS — M5030 Other cervical disc degeneration, unspecified cervical region: Secondary | ICD-10-CM | POA: Diagnosis not present

## 2018-01-07 DIAGNOSIS — Z8709 Personal history of other diseases of the respiratory system: Secondary | ICD-10-CM | POA: Diagnosis not present

## 2018-01-07 DIAGNOSIS — R3121 Asymptomatic microscopic hematuria: Secondary | ICD-10-CM | POA: Diagnosis not present

## 2018-01-07 DIAGNOSIS — R0789 Other chest pain: Secondary | ICD-10-CM

## 2018-01-07 DIAGNOSIS — Z87438 Personal history of other diseases of male genital organs: Secondary | ICD-10-CM | POA: Diagnosis not present

## 2018-01-07 DIAGNOSIS — Z86010 Personal history of colonic polyps: Secondary | ICD-10-CM

## 2018-01-07 DIAGNOSIS — Z79899 Other long term (current) drug therapy: Secondary | ICD-10-CM

## 2018-01-07 MED ORDER — PREDNISONE 5 MG PO TABS
ORAL_TABLET | ORAL | 0 refills | Status: DC
Start: 2018-01-07 — End: 2018-02-02

## 2018-01-08 ENCOUNTER — Ambulatory Visit (HOSPITAL_COMMUNITY)
Admission: RE | Admit: 2018-01-08 | Discharge: 2018-01-08 | Disposition: A | Discharge status: Home / Self Care | Payer: Medicare Other | Source: Ambulatory Visit | Attending: Physician Assistant | Admitting: Physician Assistant

## 2018-01-08 DIAGNOSIS — J9811 Atelectasis: Secondary | ICD-10-CM | POA: Insufficient documentation

## 2018-01-08 DIAGNOSIS — Z8709 Personal history of other diseases of the respiratory system: Secondary | ICD-10-CM | POA: Diagnosis not present

## 2018-01-08 DIAGNOSIS — R0789 Other chest pain: Secondary | ICD-10-CM | POA: Insufficient documentation

## 2018-01-08 LAB — CBC WITH DIFFERENTIAL/PLATELET
Basophils Absolute: 30 cells/uL (ref 0–200)
Basophils Relative: 0.6 %
Eosinophils Absolute: 40 cells/uL (ref 15–500)
Eosinophils Relative: 0.8 %
HCT: 45 % (ref 38.5–50.0)
Hemoglobin: 15.1 g/dL (ref 13.2–17.1)
Lymphs Abs: 740 cells/uL — ABNORMAL LOW (ref 850–3900)
MCH: 28.8 pg (ref 27.0–33.0)
MCHC: 33.6 g/dL (ref 32.0–36.0)
MCV: 85.7 fL (ref 80.0–100.0)
MPV: 9.7 fL (ref 7.5–12.5)
Monocytes Relative: 9.6 %
Neutro Abs: 3710 cells/uL (ref 1500–7800)
Neutrophils Relative %: 74.2 %
Platelets: 307 10*3/uL (ref 140–400)
RBC: 5.25 10*6/uL (ref 4.20–5.80)
RDW: 16 % — ABNORMAL HIGH (ref 11.0–15.0)
Total Lymphocyte: 14.8 %
WBC mixed population: 480 cells/uL (ref 200–950)
WBC: 5 10*3/uL (ref 3.8–10.8)

## 2018-01-08 LAB — COMPLETE METABOLIC PANEL WITH GFR
AG Ratio: 1.4 (calc) (ref 1.0–2.5)
ALT: 31 U/L (ref 9–46)
AST: 25 U/L (ref 10–35)
Albumin: 4.5 g/dL (ref 3.6–5.1)
Alkaline phosphatase (APISO): 76 U/L (ref 40–115)
BUN/Creatinine Ratio: 9 (calc) (ref 6–22)
BUN: 11 mg/dL (ref 7–25)
CO2: 30 mmol/L (ref 20–32)
Calcium: 10.1 mg/dL (ref 8.6–10.3)
Chloride: 101 mmol/L (ref 98–110)
Creat: 1.26 mg/dL — ABNORMAL HIGH (ref 0.70–1.18)
GFR, Est African American: 64 mL/min/{1.73_m2} (ref >=60)
GFR, Est Non African American: 55 mL/min/{1.73_m2} — ABNORMAL LOW (ref >=60)
Globulin: 3.3 g/dL (calc) (ref 1.9–3.7)
Glucose, Bld: 88 mg/dL (ref 65–99)
Potassium: 4.8 mmol/L (ref 3.5–5.3)
Sodium: 137 mmol/L (ref 135–146)
Total Bilirubin: 1 mg/dL (ref 0.2–1.2)
Total Protein: 7.8 g/dL (ref 6.1–8.1)

## 2018-01-08 NOTE — Progress Notes (Signed)
Labs are stable.

## 2018-01-09 NOTE — Progress Notes (Signed)
His CXR is unremarkable.  He has chronic elevation of his right hemidiaphragm.  Please notify patient.

## 2018-01-19 ENCOUNTER — Other Ambulatory Visit: Payer: Self-pay | Admitting: Physician Assistant

## 2018-01-24 ENCOUNTER — Other Ambulatory Visit: Payer: Self-pay | Admitting: Rheumatology

## 2018-01-26 ENCOUNTER — Telehealth: Payer: Self-pay

## 2018-01-26 NOTE — Telephone Encounter (Signed)
Received a prior authorization request for [employer] for Morrie Sheldon. Form was completed and faxed back. Will update once a I have a response.  Kissa Campoy, [place], CPhT 12:58 PM

## 2018-01-26 NOTE — Telephone Encounter (Signed)
Last visit: 01/07/2018 Next visit: 03/25/2018 Labs: 01/07/2018 stable  Okay to refill per Dr. Estanislado Pandy.

## 2018-02-02 ENCOUNTER — Encounter: Payer: Self-pay | Admitting: Family Medicine

## 2018-02-02 ENCOUNTER — Telehealth: Payer: Self-pay | Admitting: Rheumatology

## 2018-02-02 ENCOUNTER — Ambulatory Visit (INDEPENDENT_AMBULATORY_CARE_PROVIDER_SITE_OTHER): Payer: Medicare Other | Admitting: Family Medicine

## 2018-02-02 VITALS — BP 110/70 | HR 96 | Temp 99.6°F | Resp 16 | Wt 153.8 lb

## 2018-02-02 DIAGNOSIS — R509 Fever, unspecified: Secondary | ICD-10-CM | POA: Diagnosis not present

## 2018-02-02 DIAGNOSIS — J0100 Acute maxillary sinusitis, unspecified: Secondary | ICD-10-CM | POA: Diagnosis not present

## 2018-02-02 LAB — POC INFLUENZA A&B (BINAX/QUICKVUE)
Influenza A, POC: NEGATIVE
Influenza B, POC: NEGATIVE

## 2018-02-02 MED ORDER — AMOXICILLIN 875 MG PO TABS: 875.0000 mg | ORAL_TABLET | Freq: Two times a day (BID) | ORAL | 0 refills | Status: DC

## 2018-02-02 NOTE — Telephone Encounter (Signed)
He should finish his antibiotics before resuming his MTX and Xeljanz.

## 2018-02-02 NOTE — Telephone Encounter (Signed)
Received a fax from [employer] regarding a prior authorization approval for XELJANZ from 12/29/2017 to 01/28/2019.   Reference number:[id number] Phone [phone]  Will send document to scan center.  Erina Hamme, [place], CPhT 12:02 PM

## 2018-02-02 NOTE — Telephone Encounter (Signed)
Patient calling to let you know PCP dx him with sinus infection, and gave him antibiotics. Patient needs to know what to do about Xeljanx and MTX with folic acid. Please call to advise.

## 2018-02-02 NOTE — Telephone Encounter (Signed)
Returned patient's call and advised him of the information below.

## 2018-02-02 NOTE — Telephone Encounter (Signed)
Patient left a voicemail stating that he was diagnosed with a sinus infection and given a day day supply of antibiotics.

## 2018-02-02 NOTE — Patient Instructions (Signed)
Stop the Methotrexate and Morrie Sheldon while taking the antibiotic. Call and let your rheumatologist know what we are doing. We are treating your for a sinus infection.   Call me if you get worse or if you are not back to your baseline after completing the antibiotic.

## 2018-02-02 NOTE — Telephone Encounter (Signed)
Patient called stating he has cold/flu symptoms.  He is scheduled to see the primary care doctor today at 11:30 am.  He wants to know if he can take his Xeljanz 5 mg pill.

## 2018-02-02 NOTE — Progress Notes (Signed)
Chief Complaint  Patient presents with  . sick    sick- cough, running nose, sinus pressure, chest congestion, lymphs nodes sore   Subjective:  Tony Mcmillan is a 77 y.o. male who presents for a 4 day history of low grade fever, rhinorrhea, nasal congestion, sinus pain, sore throat, dry cough.   Denies chills, body aches, ear pain, dizziness, chest pain, palpitations, shortness of breath, abdominal pain, N/V/D.   He called his rheumatologist and they told him to stop the Methotrexate and Tony Mcmillan until he is feeling better.   Treatment to date: none.  Denies sick contacts.  No other aggravating or relieving factors.  No other c/o.  ROS as in subjective.   Objective: Vitals:   02/02/18 1121  BP: 110/70  Pulse: 96  Resp: 16  Temp: 99.6 F (37.6 C)  SpO2: 94%    General appearance: Alert, WD/WN, no distress, mildly ill appearing                             Skin: warm, no rash                           Head: marked right maxillary sinus tenderness                            Eyes: conjunctiva normal, corneas clear, PERRLA                            Ears: pearly TMs, external ear canals normal                          Nose: septum midline, turbinates swollen R>L, with erythema and thick discharge             Mouth/throat: MMM, tongue normal, mild pharyngeal erythema, no edema                            Neck: supple, no adenopathy, no thyromegaly, tenderness to anterior cervical node palpation                          Heart: RRR, normal S1, S2, no murmurs                         Lungs: CTA bilaterally, no wheezes, rales, or rhonchi      Assessment: Acute non-recurrent maxillary sinusitis - Plan: amoxicillin (AMOXIL) 875 MG tablet  Fever, unspecified fever cause - Plan: POC Influenza A&B(BINAX/QUICKVUE), amoxicillin (AMOXIL) 875 MG tablet    Plan: Discussed diagnosis and treatment of acute sinusitis. He did call his rheumatologist's office this morning and they advised him to  stop methotrexate and Tony Mcmillan if he starts on an antibiotic. He has done this. He will let them know.   Suggested symptomatic OTC remedies. Nasal saline spray for congestion.  Tylenol or OTC for fever and malaise.  Call/return after completing the antibiotic and let me know how he is doing. He will call or return sooner if any worsening symptoms arise.

## 2018-02-02 NOTE — Telephone Encounter (Signed)
Returned patient's call and advised patient to hold off on taking [place] today, per [employer]. Patient will call us once he gets a diagnosis from his PCP and we will advise further instructions for xeljanz. Patient verbalized understanding.

## 2018-02-10 DIAGNOSIS — H5213 Myopia, bilateral: Secondary | ICD-10-CM | POA: Diagnosis not present

## 2018-02-10 DIAGNOSIS — H04123 Dry eye syndrome of bilateral lacrimal glands: Secondary | ICD-10-CM | POA: Diagnosis not present

## 2018-02-10 DIAGNOSIS — Z961 Presence of intraocular lens: Secondary | ICD-10-CM | POA: Diagnosis not present

## 2018-03-12 NOTE — Progress Notes (Signed)
Office Visit Note  Patient: Tony Mcmillan             Date of Birth: Dec 15, 1941           MRN: [id number]             PCP: Girtha Rm, NP-C Referring: Girtha Rm, NP-C Visit Date: 03/25/2018 Occupation: @GUAROCC @    Subjective:  Left wrist pain    History of Present Illness: Tony Mcmillan is a 77 y.o. male with history of seronegative rheumatoid arthritis and DDD.  Patient states that in February he was started on a course of Amoxicillin for 10 days and held off on taking his Morrie Sheldon and methotrexate.  He states that he resumed his Morrie Sheldon and methotrexate on February 17, 2018.  Denies any flares while he was off of his medications.  He states that he continues to have left shoulder pain, bilateral hand pain and left wrist pain.  He states that he has noticed swelling in his bilateral wrists primarily in his left wrist.  He states he continues to have significant joint stiffness in multiple joints.  He states that he has noticed some benefit since being on [place].    Activities of Daily Living:  Patient reports morning stiffness for all day.   Patient Reports nocturnal pain.  Difficulty dressing/grooming: Denies Difficulty climbing stairs: Denies Difficulty getting out of chair: Denies Difficulty using hands for taps, buttons, cutlery, and/or writing: Denies   Review of Systems  Constitutional: Negative for fatigue and night sweats.  HENT: Negative for mouth sores, mouth dryness and nose dryness.   Eyes: Negative for redness, visual disturbance and dryness.  Respiratory: Positive for cough. Negative for hemoptysis, shortness of breath and difficulty breathing.   Cardiovascular: Negative for chest pain, palpitations, hypertension, irregular heartbeat and swelling in legs/feet.  Gastrointestinal: Negative for blood in stool, constipation and diarrhea.  Endocrine: Negative for increased urination.  Genitourinary: Negative for painful urination.  Musculoskeletal: Positive  for arthralgias, joint pain, joint swelling and morning stiffness. Negative for myalgias, muscle weakness, muscle tenderness and myalgias.  Skin: Negative for color change, rash, hair loss, nodules/bumps, skin tightness, ulcers and sensitivity to sunlight.  Allergic/Immunologic: Negative for susceptible to infections.  Neurological: Negative for dizziness, fainting, memory loss, night sweats and weakness.  Hematological: Negative for swollen glands.  Psychiatric/Behavioral: Negative for depressed mood and sleep disturbance. The patient is not nervous/anxious.     PMFS History:  Patient Active Problem List   Diagnosis Date Noted  . History of systemic steroid therapy 10/30/2017  . Feeling of chest tightness, unclear cause, ? due to RA 02/21/2017  . Pulmonary emphysema ([place]) 12/18/2016  . Dyspnea and respiratory abnormality 11/25/2016  . 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[place]) 11/04/2016  . High risk medication use 11/04/2016  . 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 Vitamin D deficiency 11/04/2016  . 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 History of anemia 07/02/2016  . History of colonic polyps 07/02/2016  . History of gastric ulcer 07/02/2016  . Asymptomatic microscopic hematuria 04/03/2016  . Benign prostatic hyperplasia 04/03/2016  . Renal cyst, left 04/03/2016  . 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 DDD (degenerative disc disease), lumbar 04/03/2016  . Colonic polyp 02/23/2016  . Duodenal ulcer 02/23/2016    Past Medical History:  Diagnosis Date  . Anemia   . Atypical chest pain    a. 09/2016 MV: EF 63%, normal perfusion.  . Blood transfusion without reported diagnosis   .  Diastolic dysfunction    a. 2015 Echo: EF >55%;  b. 09/2016 Echo: EF 55-60%, no rwma, Gr1 DD, triv AI/MR, mild TR, PASP 33mmHg.  . Emphysema of lung ([place]) 02/08/2016  . GERD (gastroesophageal reflux disease)   . History of systemic steroid therapy 10/30/2017  . Rheumatoid  arthritis ([place])     Family History  Problem Relation Age of Onset  . Hypertension Mother   . Hypertension Father   . Diabetes Father   . Hyperlipidemia Sister   . Diabetes Sister   . Stroke Brother   . Diabetes Brother   . Colon polyps Neg Hx   . Esophageal cancer Neg Hx   . Stomach cancer Neg Hx   . Rectal cancer Neg Hx   . Colon cancer Neg Hx    Past Surgical History:  Procedure Laterality Date  . ANTERIOR CERVICAL CORPECTOMY    . ANTERIOR CERVICAL CORPECTOMY  12/2014   for infection. this was in [place]  . CATARACT EXTRACTION     both eyes  . COLOSTOMY REVERSAL     had infection on buttocks and had to have a skin graft- had colostomy to help area stay clean and heal  . HERNIA REPAIR    . SPINE SURGERY     Social History   Social History Narrative   Retired. Lives alone.      Objective: Vital Signs: BP 119/68 (BP Location: Left Arm, Patient Position: Sitting, Cuff Size: Normal)   Pulse 82   Resp 15   Ht 5\' 2"  (1.575 m)   Wt 157 lb (71.2 kg)   BMI 28.72 kg/m    Physical Exam  Constitutional: He is oriented to person, place, and time. He appears well-developed and well-nourished.  HENT:  Head: Normocephalic and atraumatic.  Eyes: Pupils are equal, round, and reactive to light. Conjunctivae and EOM are normal.  Neck: Normal range of motion. Neck supple.  Cardiovascular: Normal rate, regular rhythm and normal heart sounds.  Pulmonary/Chest: Effort normal and breath sounds normal.  Abdominal: Soft. Bowel sounds are normal.  Lymphadenopathy:    He has no cervical adenopathy.  Neurological: He is alert and oriented to person, place, and time.  Skin: Skin is warm and dry. Capillary refill takes less than 2 seconds.  Psychiatric: He has a normal mood and affect. His behavior is normal.  Nursing note and vitals reviewed.    Musculoskeletal Exam: C-spine, thoracic, lumbar good range of motion.  No midline spinal tenderness.  No SI joint tenderness.  Bilateral shoulders  90 degrees abduction. Elbow joints and wrist joints good ROM.  Extensor tenosynovitis of right wrist.  Extensor tenosynovitis and warmth of left wrist. Tenderness of MCP joints with no synovitis.  Hip joints, knee joints, ankle joints, MTPs, PIPs, and DIPs good ROM with no synovitis.  No warmth or effusion of bilateral knees.  No rheumatoid nodulosis noted.   CDAI Exam: CDAI Homunculus Exam:   Tenderness:  LUE: glenohumeral and wrist  Swelling:  LUE: wrist  Joint Counts:  CDAI Tender Joint count: 2 CDAI Swollen Joint count: 1  Global Assessments:  Patient Global Assessment: 8   CDAI Calculated Score: 11    Investigation: No additional findings. CBC Latest Ref Rng & Units 01/07/2018 10/15/2017 07/14/2017  WBC 3.8 - 10.8 Thousand/uL 5.0 4.5 4.9  Hemoglobin 13.2 - 17.1 g/dL 15.1 14.7 15.3  Hematocrit 38.5 - 50.0 % 45.0 43.1 44.6  Platelets 140 - 400 Thousand/uL 307 254 276   CMP Latest Ref Rng &  Units 01/07/2018 10/30/2017 10/15/2017  Glucose 65 - 99 mg/dL 88 97 98  BUN 7 - 25 mg/dL 11 18 18   Creatinine 0.70 - 1.18 mg/dL 1.26(H) 1.22(H) 1.25(H)  Sodium 135 - 146 mmol/L 137 137 136  Potassium 3.5 - 5.3 mmol/L 4.8 4.2 4.8  Chloride 98 - 110 mmol/L 101 104 103  CO2 20 - 32 mmol/L 30 26 26   Calcium 8.6 - 10.3 mg/dL 10.1 9.2 9.2  Total Protein 6.1 - 8.1 g/dL 7.8 6.7 7.4  Total Bilirubin 0.2 - 1.2 mg/dL 1.0 1.2 0.9  Alkaline Phos 40 - 115 U/L - - -  AST 10 - 35 U/L 25 20 25   ALT 9 - 46 U/L 31 19 27     Imaging: No results found.  Speciality Comments: No specialty comments available.    Procedures:  No procedures performed Allergies: Patient has no known allergies.   Assessment / Plan:     Visit Diagnoses: Rheumatoid arthritis of multiple sites with negative rheumatoid factor [facility]): He has extensor tenosynovitis in bilateral wrist and tenderness of all left MCP joints.  No synovitis was noted of his MCP joints.  He was off of his Morrie Sheldon and methotrexate for 10 days while  he was on amoxicillin in February.  He resumed both of his medications on February 17, 2018.  He denies having a flare during this time.  He will continue on Xeljanz, methotrexate 0.4 mL every week and folic acid 1 mg daily.  He will be started on a short course of prednisone.  A prescription for prednisone 20 mg tapering by 5 mg every 2 days was sent to the pharmacy.  High risk medication use - MTX0.4 mL  sq q wk week, folic acid 1 mg by mouth daily, xeljanz 5 mg by mouth twice a day.  CBC and CMP were drawn today to monitor for drug toxicity.  He will return in May for lipid panel and return in July for CBC and CMP.- Plan: CBC with Differential/Platelet, COMPLETE METABOLIC PANEL WITH GFR  DDD (degenerative disc disease), cervical: Good range of motion with no discomfort on the time.  DDD (degenerative disc disease), lumbar: No midline spinal tenderness.  History of vitamin D deficiency: He is on a supplement.  Other medical conditions are listed as follows:  History of BPH  History of colonic polyps  History of COPD  History of gastric ulcer  Dyslipidemia - Patient is on Lipitor.  Asymptomatic microscopic hematuria    Orders: Orders Placed This Encounter  Procedures  . CBC with Differential/Platelet  . COMPLETE METABOLIC PANEL WITH GFR   Meds ordered this encounter  Medications  . predniSONE (DELTASONE) 5 MG tablet    Sig: Take 4 tablets by mouth for 2 days, 3 tablets by mouth for 2 days, 2 tablets by mouth for 2 days, 1 tablet by mouth for 1 day    Dispense:  20 tablet    Refill:  0      Follow-Up Instructions: Return in about 5 months (around 08/25/2018) for Rheumatoid arthritis, DDD.   Ofilia Neas, PA-C  I examined and evaluated the patient with [name] PA.  On my examination patient had some active synovitis and tenosynovitis in his bilateral wrist joints.  He was off [place] short time last month.  We decided to give him a short-term prednisone taper and would  like to see the effect of Morrie Sheldon over the next 3 months.  The plan of care was discussed as noted  above.  Bo Merino, MD Note - This record has been created using [employer].  Chart creation errors have been sought, but may not always  have been located. Such creation errors do not reflect on  the standard of medical care.

## 2018-03-17 ENCOUNTER — Other Ambulatory Visit: Payer: Self-pay | Admitting: Rheumatology

## 2018-03-17 NOTE — Telephone Encounter (Signed)
Last visit: 01/07/2018 Next visit: 03/25/2018 Labs: 01/07/2018 stable  Okay to refill per Dr. Estanislado Pandy.

## 2018-03-19 ENCOUNTER — Other Ambulatory Visit: Payer: Self-pay | Admitting: Rheumatology

## 2018-03-19 NOTE — Telephone Encounter (Signed)
Last visit: 01/07/2018 Next visit: 03/25/2018 Labs: 01/07/2018 stable  Okay to refill per Dr. Estanislado Pandy.

## 2018-03-25 ENCOUNTER — Encounter: Payer: Self-pay | Admitting: Rheumatology

## 2018-03-25 ENCOUNTER — Ambulatory Visit (INDEPENDENT_AMBULATORY_CARE_PROVIDER_SITE_OTHER): Payer: Medicare Other | Admitting: Rheumatology

## 2018-03-25 VITALS — BP 119/68 | HR 82 | Resp 15 | Ht 62.0 in | Wt 157.0 lb

## 2018-03-25 DIAGNOSIS — Z8719 Personal history of other diseases of the digestive system: Secondary | ICD-10-CM

## 2018-03-25 DIAGNOSIS — Z8711 Personal history of peptic ulcer disease: Secondary | ICD-10-CM

## 2018-03-25 DIAGNOSIS — E785 Hyperlipidemia, unspecified: Secondary | ICD-10-CM | POA: Diagnosis not present

## 2018-03-25 DIAGNOSIS — M0609 Rheumatoid arthritis without rheumatoid factor, multiple sites: Secondary | ICD-10-CM | POA: Diagnosis not present

## 2018-03-25 DIAGNOSIS — Z87438 Personal history of other diseases of male genital organs: Secondary | ICD-10-CM

## 2018-03-25 DIAGNOSIS — M5030 Other cervical disc degeneration, unspecified cervical region: Secondary | ICD-10-CM

## 2018-03-25 DIAGNOSIS — Z860100 Personal history of colon polyps, unspecified: Secondary | ICD-10-CM

## 2018-03-25 DIAGNOSIS — M5136 Other intervertebral disc degeneration, lumbar region: Secondary | ICD-10-CM | POA: Diagnosis not present

## 2018-03-25 DIAGNOSIS — M51369 Other intervertebral disc degeneration, lumbar region without mention of lumbar back pain or lower extremity pain: Secondary | ICD-10-CM

## 2018-03-25 DIAGNOSIS — R3121 Asymptomatic microscopic hematuria: Secondary | ICD-10-CM | POA: Diagnosis not present

## 2018-03-25 DIAGNOSIS — Z8709 Personal history of other diseases of the respiratory system: Secondary | ICD-10-CM | POA: Diagnosis not present

## 2018-03-25 DIAGNOSIS — Z79899 Other long term (current) drug therapy: Secondary | ICD-10-CM

## 2018-03-25 DIAGNOSIS — Z8639 Personal history of other endocrine, nutritional and metabolic disease: Secondary | ICD-10-CM

## 2018-03-25 LAB — COMPLETE METABOLIC PANEL WITH GFR
AG Ratio: 1.5 (calc) (ref 1.0–2.5)
ALT: 22 U/L (ref 9–46)
AST: 23 U/L (ref 10–35)
Albumin: 4.1 g/dL (ref 3.6–5.1)
Alkaline phosphatase (APISO): 61 U/L (ref 40–115)
BUN/Creatinine Ratio: 13 (calc) (ref 6–22)
BUN: 17 mg/dL (ref 7–25)
CO2: 28 mmol/L (ref 20–32)
Calcium: 9.9 mg/dL (ref 8.6–10.3)
Chloride: 103 mmol/L (ref 98–110)
Creat: 1.35 mg/dL — ABNORMAL HIGH (ref 0.70–1.18)
GFR, Est African American: 59 mL/min/{1.73_m2} — ABNORMAL LOW (ref >=60)
GFR, Est Non African American: 51 mL/min/{1.73_m2} — ABNORMAL LOW (ref >=60)
Globulin: 2.8 g/dL (calc) (ref 1.9–3.7)
Glucose, Bld: 102 mg/dL — ABNORMAL HIGH (ref 65–99)
Potassium: 4.6 mmol/L (ref 3.5–5.3)
Sodium: 138 mmol/L (ref 135–146)
Total Bilirubin: 0.9 mg/dL (ref 0.2–1.2)
Total Protein: 6.9 g/dL (ref 6.1–8.1)

## 2018-03-25 LAB — CBC WITH DIFFERENTIAL/PLATELET
Basophils Absolute: 9 cells/uL (ref 0–200)
Basophils Relative: 0.2 %
Eosinophils Absolute: 88 cells/uL (ref 15–500)
Eosinophils Relative: 2 %
HCT: 39.9 % (ref 38.5–50.0)
Hemoglobin: 13.7 g/dL (ref 13.2–17.1)
Lymphs Abs: 810 cells/uL — ABNORMAL LOW (ref 850–3900)
MCH: 29.1 pg (ref 27.0–33.0)
MCHC: 34.3 g/dL (ref 32.0–36.0)
MCV: 84.9 fL (ref 80.0–100.0)
MPV: 9.3 fL (ref 7.5–12.5)
Monocytes Relative: 10.2 %
Neutro Abs: 3045 cells/uL (ref 1500–7800)
Neutrophils Relative %: 69.2 %
Platelets: 303 10*3/uL (ref 140–400)
RBC: 4.7 10*6/uL (ref 4.20–5.80)
RDW: 16.3 % — ABNORMAL HIGH (ref 11.0–15.0)
Total Lymphocyte: 18.4 %
WBC mixed population: 449 cells/uL (ref 200–950)
WBC: 4.4 10*3/uL (ref 3.8–10.8)

## 2018-03-25 MED ORDER — PREDNISONE 5 MG PO TABS: ORAL_TABLET | ORAL | 0 refills | Status: DC

## 2018-03-25 NOTE — Patient Instructions (Addendum)
Standing Labs We placed an order today for your standing lab work.    Please come back and get your standing labs in July and every 3 months  Lipid panel due in May 2019   We have open lab Monday through Friday from 8:30-11:30 AM and 1:30-4:00 PM  at the office of Dr. Bo Merino.   You may experience shorter wait times on Monday and Friday afternoons. The office is located at [address] [place]mill[place], [place], [place] [address] No appointment is necessary.   Labs are drawn by [facility].  You may receive a bill from [facility] for your lab work. If you have any questions regarding directions or hours of operation,  please call [phone].

## 2018-03-26 NOTE — Progress Notes (Signed)
Creatinine is trending up.  GFR is low.  Please advise patient to decrease MTX to 0.3 ml weekly.  Please advise him to avoid NSAIDs.   All other labs are stable.

## 2018-03-30 ENCOUNTER — Telehealth: Payer: Self-pay | Admitting: Rheumatology

## 2018-03-30 NOTE — Telephone Encounter (Signed)
Returned patient's call and advised patient of lab results and recommendations. Patient verbalized understanding.

## 2018-03-30 NOTE — Telephone Encounter (Signed)
Patient left a voicemail requesting the results from his labwork that he had on 03/25/18.

## 2018-04-07 ENCOUNTER — Other Ambulatory Visit: Payer: Self-pay | Admitting: *Deleted

## 2018-04-07 MED ORDER — "TUBERCULIN SYRINGE 27G X 1/2"" 1 ML MISC": 3 refills | Status: DC

## 2018-04-07 NOTE — Telephone Encounter (Signed)
Refill request received via fax  Last Visit: 03/25/18 Next Visit: 08/26/18  Okay to refill per Dr. Estanislado Pandy

## 2018-04-09 ENCOUNTER — Other Ambulatory Visit: Payer: Self-pay | Admitting: Family Medicine

## 2018-04-09 DIAGNOSIS — E7800 Pure hypercholesterolemia, unspecified: Secondary | ICD-10-CM

## 2018-04-30 ENCOUNTER — Ambulatory Visit: Payer: Medicare Other | Admitting: Internal Medicine

## 2018-05-04 ENCOUNTER — Ambulatory Visit (INDEPENDENT_AMBULATORY_CARE_PROVIDER_SITE_OTHER): Payer: Medicare Other | Admitting: Internal Medicine

## 2018-05-04 ENCOUNTER — Encounter: Payer: Self-pay | Admitting: Internal Medicine

## 2018-05-04 VITALS — BP 118/74 | HR 83 | Ht 62.0 in | Wt 154.4 lb

## 2018-05-04 DIAGNOSIS — R0789 Other chest pain: Secondary | ICD-10-CM

## 2018-05-04 DIAGNOSIS — J439 Emphysema, unspecified: Secondary | ICD-10-CM | POA: Diagnosis not present

## 2018-05-04 DIAGNOSIS — G4734 Idiopathic sleep related nonobstructive alveolar hypoventilation: Secondary | ICD-10-CM | POA: Diagnosis not present

## 2018-05-04 NOTE — Patient Instructions (Addendum)
Pulmonary emphysema, unspecified emphysema type (HCC) Nocturnal hypoxemia  - stable disease  -continue bevespi 2 puff twice daily; take sample if we have it - ok to travel for few days without night oxygen  - given fact you are doing well, retest at home for night oxygen need (do test on room air sleeping)   Feeling of chest tightness, unclear cause, ? due to RA  - uanble to help you here so far - please d/w PCP Raenette Rover, Vickie L, NP-C   followup 6-9 month or sooner if needed; CAT score at followup

## 2018-05-04 NOTE — Progress Notes (Signed)
Subjective:     Patient ID: Tony Mcmillan, male   DOB: Mar 22, 1941, 77 y.o.   MRN: [id number]  HPI  OV 02/21/2017  Chief Complaint  Patient presents with  . Follow-up    Pt states his breathing is unchanged. Pt c/o right sided chest tightness that is persistent  - since 11/2016. Pt c/o prod cough with clear mucus.     77 year old African-American male with RA on methotrexate. Has dyspnea on exertion. Workup over the last few months as showing emphysema with isolated reduction in diffusion capacity. He's been started on Spiriva and nocturnal oxygen. He returns for follow-up after institution of these therapies. He tells me that he's not sure if these therapies have helped. He thinks it might have helped. However he is reporting new onset in the last month or 2 but definitely postdating starting of the Spiriva therapy is constant chest tightness on the right breast area. There is no clear cut aggravating factor or relieving factor. It is not a pain but it as a chest tightness. This no radiation. It is mild to moderate but definitely annoying. This no fever or chills or wheezing is definitely seeking relief for this.     03/24/2017 Follow Up Appointment: Pt. Presents today for follow up. He was seen by Dr. Chase Caller 02/25/2017. He had been switched from Spiriva to [facility] for continued chest tightness.  Pt. States he does not really notice much of a change. He still is experiencing  some very mild chest tightness every now and again.There is no patten to the chest tightness.He states it is not bad chest tightness and that it self resolves.He was seen by cardiology 01/2017 normal ECG, and previously normal ischemic evaluation. They signed off for follow up as needed. He is agreeable to continuing on the [place] as he has notes a slight improvement. We did not start a rescue inhaler as there was no BD response on PFT. He denies any chest pain, fever, orthopnea or hemoptysis. Minimal secretions with cough.He has  added calcium and Vitamin D3 as treatment to his medication regimen while on methotrexate. He states he is complaint with his nocturnal oxygen.Saturations today are 94% on RA today.He is compliant with his Pulmonary rehab, and feels he is benefiting from treatment.  Tests 2-D echo October 2017 showed grade 1 diastolic dysfunction. Cardiac stress test negative in October 2017 Walk test in the office December 2017 did not show any type of desaturation. Chest x-ray October 2017 showed clear lungs High resolution CT chest 12/03/2016 showed no ILD, mild emphysema, mild diffuse bronchial wall thickening, focal area of scarring in the right upper lobe ono 11/29/16 shows pulse ox </= 88% at 46 min. Start 1L [place]  Pulmonary function test done 11/26/2016 showed an FEV1 at 74%, ratio 82, FVC 65%, no bronchodilator response, ERV 138%, DLCO 53%  OV 07/01/2017  Chief Complaint  Patient presents with  . Follow-up    Pt states he has right sided chest tightness that is constant. Pt denies significant cough, f/c/s, and SOB.    Follow-up rheumatoid arthritis patient with emphysema and associated right-sided chest tightness  This is a routine follow-up. Last summer nurse partition April 2018. He is on dual inhaler therapy scheduled long-acting beta agonist and anticholinergic may by [employer]. This is working well for him. He is stable in terms of dyspnea. In fact is no dyspnea. He is attending pulmonary rehabilitation and has no problems. His main issues that he still continues to have moderate amount of  right-sided chest tightness between the right breast and the right sternal area. The circular area around 5 or 7 cm in size. The constant feeling of tightness. Is moderate in severity. Present for several months without any progression. No radiation. No diaphoresis. He is frustrated by it. The inhalers are not helping.   OV 10/28/2017 Chief Complaint  Patient presents with  . Follow-up    Pt here due to  [place] stating pt needing to be re-evaluated. States that he wears 1L O2 at night. States that he has occ. coughing with occ. chest tightness.    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 He wants to be followed For his nocturnal oxygen. He is stable in terms of dyspnea. He is on Bevespi which he is working well for him. He is up-to-date with his flu shot. He does have some random episodes of cough particularly when the weather [place] mother does want it is mild and manageable but he does want to add on inhaler for this. Walking desaturation to as 185 feet 3 laps on room air: Resting pulse ox 100%. Final pulse ox 96%. Resting heart rate 87/m. Final heart rate 114/m.  Unexplained right-sided chest tightness: This persists without change  OV 05/04/2018  Chief Complaint  Patient presents with  . Follow-up    Pt states he has been doing good since last visit.  Pt still has some pain midsternal, some mild SOB, and a mild cough.   Follow-up emphysema on nocturnal oxygen the patient with rheumatoid arthritis  Follow-up unexplained right-sided chest tightness  77 year old male.  Presents for routine follow-up.  Since I last saw him his methotrexate has been reduced because of a rising creatinine.  Morrie Sheldon has been added.  This is in April 2019.  I personally reviewed the chart by Dr. D and confirmed the findings.  I also visualized the results.  In terms of his emphysema and dyspnea this is stable.  He continues to be on inhaler Bevespi 2 puffs 2 times daily and is compliant with it.  There are no new issues.  He has upcoming travel for family vacation for a few to several days.  He wants to know if he can discontinue his nocturnal oxygen.  Overall and the time that he has spent since he started the nocturnal oxygen in terms of his dyspnea he is better.  He has never  desaturated with exertion in the past.  There is no cough.  His unexplained right-sided chest tightness which is constant versus intermittent without any reproducible tenderness is still ongoing.   has a past medical history of Anemia, Atypical chest pain, Blood transfusion without reported diagnosis, Diastolic dysfunction, Emphysema of lung ([place]) (02/08/2016), GERD (gastroesophageal reflux disease), History of systemic steroid therapy (10/30/2017), and Rheumatoid arthritis ([place]).   reports that he quit smoking about 24 years ago. His smoking use included cigarettes. He has a 17.00 pack-year smoking history. He has never used smokeless tobacco.  Past Surgical History:  Procedure Laterality Date  . ANTERIOR CERVICAL CORPECTOMY    . ANTERIOR CERVICAL CORPECTOMY  12/2014   for infection. this was in [place]  . CATARACT EXTRACTION     both eyes  . COLOSTOMY REVERSAL     had infection on buttocks and had to have a skin graft- had colostomy to help area stay clean and heal  . HERNIA REPAIR    . SPINE SURGERY  No Known Allergies  Immunization History  Administered Date(s) Administered  . Influenza, High Dose Seasonal PF 07/23/2017  . Influenza-Unspecified 10/30/2015, 09/13/2016  . Pneumococcal Conjugate-13 12/05/2015  . Pneumococcal Polysaccharide-23 04/23/2017  . Tdap 12/09/2015  . Zoster 12/09/2015    Family History  Problem Relation Age of Onset  . Hypertension Mother   . Hypertension Father   . Diabetes Father   . Hyperlipidemia Sister   . Diabetes Sister   . Stroke Brother   . Diabetes Brother   . Colon polyps Neg Hx   . Esophageal cancer Neg Hx   . Stomach cancer Neg Hx   . Rectal cancer Neg Hx   . Colon cancer Neg Hx      Current Outpatient Medications:  .  atorvastatin (LIPITOR) 10 MG tablet, TAKE 1 TABLET EVERY DAY, Disp: 30 tablet, Rfl: 6 .  B-D TB SYRINGE 1CC/27GX1/2" 27G X 1/2" 1 ML MISC, Inject 4 mg as directed 1 day or 1 dose., Disp: , Rfl: 3 .  BEVESPI  AEROSPHERE 9-4.8 MCG/ACT AERO, INHALE 2 PUFFS INTO THE LUNGS 2 (TWO) TIMES DAILY., Disp: 10.7 Inhaler, Rfl: 5 .  Calcium Carb-Cholecalciferol (CALCIUM-VITAMIN D3) 600-400 MG-UNIT TABS, Take by mouth., Disp: , Rfl:  .  folic acid (FOLVITE) 1 MG tablet, Take 2 tablets (2 mg total) by mouth daily., Disp: 180 tablet, Rfl: 3 .  methotrexate 50 MG/2ML injection, INJECT 0.4 ML (10 MG) UNDER THE SKIN ONCE A WEEK (EXPIRES IN 28 DAYS) (Patient taking differently: INJECT 3ML UNDER THE SKIN ONCE A WEEK (EXPIRES IN 28 DAYS)), Disp: 6 mL, Rfl: 0 .  TUBERCULIN SYR 1CC/27GX1/2" (B-D TB SYRINGE 1CC/27GX1/2") 27G X 1/2" 1 ML MISC, INJECT 1 SYRINGE INTO THE SKIN ONCE A WEEK. TO BE USED WITH WEEKLY METHOTREXATE INJECTIONS., Disp: 12 each, Rfl: 3 .  XELJANZ 5 MG TABS, TAKE 1 TABLET (5 MG) TWICE A DAY, Disp: 120 tablet, Rfl: 0    Review of Systems     Objective:   Physical Exam  Constitutional: He is oriented to person, place, and time. He appears well-developed and well-nourished. No distress.  Wearing a green [employer].  Normally he wears blue.  HENT:  Head: Normocephalic and atraumatic.  Right Ear: External ear normal.  Left Ear: External ear normal.  Mouth/Throat: Oropharynx is clear and moist. No oropharyngeal exudate.  Eyes: Pupils are equal, round, and reactive to light. Conjunctivae and EOM are normal. Right eye exhibits no discharge. Left eye exhibits no discharge. No scleral icterus.  Neck: Normal range of motion. Neck supple. No JVD present. No tracheal deviation present. No thyromegaly present.  Cardiovascular: Normal rate, regular rhythm and intact distal pulses. Exam reveals no gallop and no friction rub.  No murmur heard. Pulmonary/Chest: Effort normal and breath sounds normal. No respiratory distress. He has no wheezes. He has no rales. He exhibits no tenderness.  Abdominal: Soft. Bowel sounds are normal. He exhibits no distension and no mass. There is no tenderness. There is no  rebound and no guarding.  Musculoskeletal: Normal range of motion. He exhibits no edema or tenderness.  Lymphadenopathy:    He has no cervical adenopathy.  Neurological: He is alert and oriented to person, place, and time. He has normal reflexes. No cranial nerve deficit. Coordination normal.  Skin: Skin is warm and dry. No rash noted. He is not diaphoretic. No erythema. No pallor.  Psychiatric: He has a normal mood and affect. His behavior is normal. Judgment and thought content normal.  Nursing note and vitals reviewed.  Today's Vitals   05/04/18 1042  BP: 118/74  Pulse: 83  SpO2: 95%  Weight: 154 lb 6.4 oz (70 kg)  Height: 5\' 2"  (1.575 m)    Estimated body mass index is 28.24 kg/m as calculated from the following:   Height as of this encounter: 5\' 2"  (1.575 m).   Weight as of this encounter: 154 lb 6.4 oz (70 kg).     Assessment:       ICD-10-CM   1. Pulmonary emphysema, unspecified emphysema type ([place]) J43.9   2. 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 Feeling of chest tightness, unclear cause, ? due to RA R07.89        Plan:     Pulmonary emphysema, unspecified emphysema type (HCC) Nocturnal hypoxemia  - stable disease  -continue bevespi 2 puff twice daily; take sample if we have it - ok to travel for few days without night oxygen  - given fact you are doing well, retest at home for night oxygen need (do test on room air sleeping)   Feeling of chest tightness, unclear cause, ? due to RA  - uanble to help you here so far - please d/w PCP Raenette Rover, Vickie L, NP-C   followup 6-9 month or sooner if needed; CAT score at followup    Dr. Brand Males, M.D., [facility].C.P Pulmonary and Critical Care Medicine Staff Physician, [place] Director - Interstitial Lung Disease  Program  Pulmonary [place] at [place], [place], [address]  Pager: [phone], If no answer or between  15:00h - 7:00h: call [phone]   [phone] Telephone: [phone]

## 2018-05-06 ENCOUNTER — Encounter (HOSPITAL_COMMUNITY): Payer: Self-pay | Admitting: Emergency Medicine

## 2018-05-06 ENCOUNTER — Emergency Department (HOSPITAL_COMMUNITY)
Admission: EM | Admit: 2018-05-06 | Discharge: 2018-05-06 | Disposition: A | Discharge status: Home / Self Care | Payer: Medicare Other | Attending: Emergency Medicine | Admitting: Emergency Medicine

## 2018-05-06 ENCOUNTER — Emergency Department (HOSPITAL_COMMUNITY): Payer: Medicare Other

## 2018-05-06 DIAGNOSIS — Z87891 Personal history of nicotine dependence: Secondary | ICD-10-CM | POA: Insufficient documentation

## 2018-05-06 DIAGNOSIS — Z79899 Other long term (current) drug therapy: Secondary | ICD-10-CM | POA: Insufficient documentation

## 2018-05-06 DIAGNOSIS — Z7982 Long term (current) use of aspirin: Secondary | ICD-10-CM | POA: Insufficient documentation

## 2018-05-06 DIAGNOSIS — R0602 Shortness of breath: Secondary | ICD-10-CM | POA: Diagnosis not present

## 2018-05-06 DIAGNOSIS — R079 Chest pain, unspecified: Secondary | ICD-10-CM | POA: Diagnosis present

## 2018-05-06 LAB — CBC
HCT: 40.8 % (ref 39.0–52.0)
Hemoglobin: 13.8 g/dL (ref 13.0–17.0)
MCH: 29.9 pg (ref 26.0–34.0)
MCHC: 33.8 g/dL (ref 30.0–36.0)
MCV: 88.3 fL (ref 78.0–100.0)
Platelets: 320 10*3/uL (ref 150–400)
RBC: 4.62 MIL/uL (ref 4.22–5.81)
RDW: 16.2 % — ABNORMAL HIGH (ref 11.5–15.5)
WBC: 5.2 10*3/uL (ref 4.0–10.5)

## 2018-05-06 LAB — I-STAT TROPONIN, ED: Troponin i, poc: 0 ng/mL (ref 0.00–0.08)

## 2018-05-06 LAB — BASIC METABOLIC PANEL
Anion gap: 8 (ref 5–15)
BUN: 15 mg/dL (ref 6–20)
CO2: 27 mmol/L (ref 22–32)
Calcium: 9.4 mg/dL (ref 8.9–10.3)
Chloride: 104 mmol/L (ref 101–111)
Creatinine, Ser: 1.34 mg/dL — ABNORMAL HIGH (ref 0.61–1.24)
GFR calc Af Amer: 58 mL/min — ABNORMAL LOW (ref >60)
GFR calc non Af Amer: 50 mL/min — ABNORMAL LOW (ref >60)
Glucose, Bld: 91 mg/dL (ref 65–99)
Potassium: 4.5 mmol/L (ref 3.5–5.1)
Sodium: 139 mmol/L (ref 135–145)

## 2018-05-06 NOTE — ED Provider Notes (Signed)
[facility] DEPT Provider Note   CSN: [id number] Arrival date & time: 05/06/18  1444     History   Chief Complaint Chief Complaint  Patient presents with  . Chest Pain    HPI Tony Mcmillan is a 77 y.o. male.  Complains of intermittent sharp anterior chest pain since yesterday nonradiating without dyspnea, diaphoresis, nausea.  Past medical history includes COPD for which he is on oxygen as needed at home.  No history of MI.  Past medical history includes rheumatoid arthritis, COPD, diastolic dysfunction, atypical chest pain.  He is taken nothing for his pain at home.  Severity is mild.  Nothing makes symptoms better or worse.     Past Medical History:  Diagnosis Date  . Anemia   . Atypical chest pain    a. 09/2016 MV: EF 63%, normal perfusion.  . Blood transfusion without reported diagnosis   . Diastolic dysfunction    a. 2015 Echo: EF >55%;  b. 09/2016 Echo: EF 55-60%, no rwma, Gr1 DD, triv AI/MR, mild TR, PASP 73mmHg.  . Emphysema of lung ([place]) 02/08/2016  . GERD (gastroesophageal reflux disease)   . History of systemic steroid therapy 10/30/2017  . Rheumatoid arthritis [facility])     Patient Active Problem List   Diagnosis Date Noted  . History of systemic steroid therapy 10/30/2017  . Feeling of chest tightness, unclear cause, ? due to RA 02/21/2017  . Pulmonary emphysema ([place]) 12/18/2016  . Dyspnea and respiratory abnormality 11/25/2016  . 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[place]) 11/04/2016  . High risk medication use 11/04/2016  . 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 Vitamin D deficiency 11/04/2016  . 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 History of anemia 07/02/2016  . History of colonic polyps 07/02/2016  . History of gastric ulcer 07/02/2016  . Asymptomatic microscopic hematuria 04/03/2016  . Benign prostatic hyperplasia 04/03/2016  . Renal cyst, left 04/03/2016  . 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 DDD (degenerative disc disease), lumbar 04/03/2016  . Colonic polyp 02/23/2016  . Duodenal ulcer 02/23/2016    Past Surgical History:  Procedure Laterality Date  . ANTERIOR CERVICAL CORPECTOMY    . ANTERIOR CERVICAL CORPECTOMY  12/2014   for infection. this was in [place]  . CATARACT EXTRACTION     both eyes  . COLOSTOMY REVERSAL     had infection on buttocks and had to have a skin graft- had colostomy to help area stay clean and heal  . HERNIA REPAIR    . SPINE SURGERY          Home Medications    Prior to Admission medications   Medication Sig Start Date End Date Taking? Authorizing Provider  aspirin EC 81 MG tablet Take 81 mg by mouth daily.   Yes [provider]  atorvastatin (LIPITOR) 10 MG tablet TAKE 1 TABLET EVERY DAY 04/09/18  Yes Henson, Vickie L, NP-C  BEVESPI AEROSPHERE 9-4.8 MCG/ACT AERO INHALE 2 PUFFS INTO THE LUNGS 2 (TWO) TIMES DAILY. 07/11/17  Yes Brand Males, MD  Calcium Carb-Cholecalciferol (CALCIUM-VITAMIN D3) 600-400 MG-UNIT TABS Take by mouth.   Yes [provider]  folic acid (FOLVITE) 1 MG tablet Take 2 tablets (2 mg total) by mouth daily. 12/03/17  Yes Deveshwar, Abel Presto, MD  methotrexate 50 MG/2ML injection INJECT 0.4 ML (10 MG) UNDER THE SKIN ONCE A WEEK (EXPIRES IN 28 DAYS) Patient taking differently: INJECT 3ML UNDER THE SKIN ONCE A WEEK (EXPIRES IN 28 DAYS) 03/19/18  Yes Deveshwar, Abel Presto, MD  XELJANZ 5 MG TABS TAKE 1 TABLET (5 MG) TWICE A DAY 03/17/18  Yes Deveshwar, Abel Presto, MD  B-D TB SYRINGE 1CC/27GX1/2" 27G X 1/2" 1 ML MISC Inject 4 mg as directed 1 day or 1 dose. 07/10/17   [provider]  TUBERCULIN SYR 1CC/27GX1/2" (B-D TB SYRINGE 1CC/27GX1/2") 27G X 1/2" 1 ML MISC INJECT 1 SYRINGE INTO THE SKIN ONCE A WEEK. TO BE USED WITH WEEKLY METHOTREXATE INJECTIONS. 04/07/18   Bo Merino, MD    Family History Family History  Problem Relation Age of Onset  . Hypertension Mother   . Hypertension Father   . Diabetes  Father   . Hyperlipidemia Sister   . Diabetes Sister   . Stroke Brother   . Diabetes Brother   . Colon polyps Neg Hx   . Esophageal cancer Neg Hx   . Stomach cancer Neg Hx   . Rectal cancer Neg Hx   . Colon cancer Neg Hx     Social History Social History   Tobacco Use  . Smoking status: Former Smoker    Packs/day: 1.00    Years: 17.00    Pack years: 17.00    Types: Cigarettes    Last attempt to quit: 12/23/1993    Years since quitting: 24.3  . Smokeless tobacco: Never Used  Substance Use Topics  . Alcohol use: No    Alcohol/week: 0.0 oz  . Drug use: No     Allergies   Patient has no known allergies.   Review of Systems Review of Systems  All other systems reviewed and are negative.    Physical Exam Updated Vital Signs BP (!) 146/81 (BP Location: Left Arm)   Pulse 86   Temp 97.8 F (36.6 C) (Oral)   Resp 18   Ht 5\' 2"  (1.575 m)   Wt 69.9 kg (154 lb)   SpO2 97%   BMI 28.17 kg/m   Physical Exam  Constitutional: He is oriented to person, place, and time. He appears well-developed and well-nourished.  Alert, interactive, no acute distress  HENT:  Head: Normocephalic and atraumatic.  Eyes: Conjunctivae are normal.  Neck: Neck supple.  Cardiovascular: Normal rate and regular rhythm.  Pulmonary/Chest: Effort normal and breath sounds normal.  Abdominal: Soft. Bowel sounds are normal.  Musculoskeletal: Normal range of motion.  Neurological: He is alert and oriented to person, place, and time.  Skin: Skin is warm and dry.  Psychiatric: He has a normal mood and affect. His behavior is normal.  Nursing note and vitals reviewed.    ED Treatments / Results  Labs (all labs ordered are listed, but only abnormal results are displayed) Labs Reviewed  BASIC METABOLIC PANEL - Abnormal; Notable for the following components:      Result Value   Creatinine, Ser 1.34 (*)    GFR calc non Af Amer 50 (*)    GFR calc Af Amer 58 (*)    All other components within  normal limits  CBC - Abnormal; Notable for the following components:   RDW 16.2 (*)    All other components within normal limits  I-STAT TROPONIN, ED    EKG EKG Interpretation  Date/Time:  Wednesday May 06 2018 14:55:16 EDT Ventricular Rate:  67 PR Interval:    QRS Duration: 77 QT Interval:  351 QTC Calculation: 371 R Axis:   26 Text Interpretation:  Sinus rhythm Atrial premature complexes Low voltage, precordial leads Minimal ST elevation, inferior leads Confirmed by Nat Christen (  [id number]) on 05/06/2018 5:09:17 PM   Radiology Dg Chest 2 View  Result Date: 05/06/2018 CLINICAL DATA:  Chest pain for 2 days with shortness of breath and weakness. EXAM: CHEST - 2 VIEW COMPARISON:  01/08/2018 FINDINGS: The cardiomediastinal silhouette is within normal limits. Right hemidiaphragm elevation is unchanged. No acute airspace consolidation, edema, pleural effusion, pneumothorax is identified. There is minimal thoracolumbar dextroscoliosis. IMPRESSION: No active cardiopulmonary disease. Electronically Signed   By: Logan Bores M.D.   On: 05/06/2018 15:57    Procedures Procedures (including critical care time)  Medications Ordered in ED Medications - No data to display   Initial Impression / Assessment and Plan / ED Course  I have reviewed the triage vital signs and the nursing notes.  Pertinent labs & imaging results that were available during my care of the patient were reviewed by me and considered in my medical decision making (see chart for details).     Patient presents with sharp intermittent chest pain.  His vital signs and physical exam are normal.  Screening labs, EKG, troponin, chest x-ray all negative.  Discussed with the patient and his daughter.  He will follow-up with his primary care doctor.  Final Clinical Impressions(s) / ED Diagnoses   Final diagnoses:  Chest pain, unspecified type    ED Discharge Orders    None       Nat Christen, MD 05/06/18 2027

## 2018-05-06 NOTE — Discharge Instructions (Addendum)
Tests were good.  Follow-up your primary care doctor.  Tylenol for pain.

## 2018-05-06 NOTE — ED Triage Notes (Signed)
Pt c/o left sided chest pains since last night. Denies n/v/d.

## 2018-05-06 NOTE — ED Notes (Signed)
Attempted blood draw was unsuccessful 

## 2018-05-08 DIAGNOSIS — G4734 Idiopathic sleep related nonobstructive alveolar hypoventilation: Secondary | ICD-10-CM | POA: Diagnosis not present

## 2018-05-15 ENCOUNTER — Telehealth: Payer: Self-pay | Admitting: Internal Medicine

## 2018-05-15 DIAGNOSIS — G4734 Idiopathic sleep related nonobstructive alveolar hypoventilation: Secondary | ICD-10-CM

## 2018-05-15 NOTE — Telephone Encounter (Signed)
ono </= 88% only 2.4 min.   Plan Ok to dc night o2   Dr. Brand Males, M.D., [facility].C.P Pulmonary and Critical Care Medicine Staff Physician, Hand Director - Interstitial Lung Disease  Program  Pulmonary [place] [facility] [place], [place], [address]  Pager: [phone], If no answer or between  15:00h - 7:00h: call [phone] Telephone: [phone]

## 2018-05-17 ENCOUNTER — Other Ambulatory Visit: Payer: Self-pay | Admitting: Rheumatology

## 2018-05-19 NOTE — Telephone Encounter (Signed)
Last visit: 03/25/2018 Next visit: 08/26/2018 Labs: 03/25/2018 Creatinine is trending up. GFR is low.   Okay to refill per Dr. Estanislado Pandy.

## 2018-05-20 NOTE — Telephone Encounter (Signed)
Spoke with pt, aware of results/recs.  Order placed to d/c nocturnal O2. Nothing further needed.

## 2018-06-01 ENCOUNTER — Other Ambulatory Visit: Payer: Self-pay | Admitting: *Deleted

## 2018-06-01 MED ORDER — OPTICHAMBER DIAMOND DEVI: 1.0000 | 0 refills | Status: DC | PRN

## 2018-06-12 ENCOUNTER — Encounter: Payer: Self-pay | Admitting: Family Medicine

## 2018-07-12 ENCOUNTER — Other Ambulatory Visit: Payer: Self-pay | Admitting: Rheumatology

## 2018-07-13 NOTE — Telephone Encounter (Signed)
Last visit: 03/25/2018 Next visit: 08/26/2018 Labs: 05/06/18 stable  Okay to refill per Dr. Estanislado Pandy.

## 2018-07-15 ENCOUNTER — Other Ambulatory Visit: Payer: Self-pay | Admitting: Rheumatology

## 2018-07-16 NOTE — Telephone Encounter (Signed)
Last visit: 03/25/2018 Next visit: 08/26/2018 Labs: 05/06/18 stable   Okay to refill per Dr. Estanislado Pandy

## 2018-07-24 ENCOUNTER — Other Ambulatory Visit: Payer: Self-pay

## 2018-07-30 DIAGNOSIS — Z23 Encounter for immunization: Secondary | ICD-10-CM | POA: Diagnosis not present

## 2018-08-02 NOTE — Progress Notes (Signed)
Tony Mcmillan is a 77 y.o. male who presents for annual wellness visit and follow-up on chronic medical conditions.  He has the following concerns:   Nocturia- 3-4 times per night. Has seen [facility] in the past. Last exam 01/2017.  BPH history. Plans to schedule with urology.   States he got his flu shot last week at [facility] on [address]    Immunization History  Administered Date(s) Administered  . Influenza, High Dose Seasonal PF 07/23/2017  . Influenza-Unspecified 10/30/2015, 09/13/2016  . Pneumococcal Conjugate-13 12/05/2015  . Pneumococcal Polysaccharide-23 04/23/2017  . Tdap 12/09/2015  . Zoster 12/09/2015   Last colonoscopy: 01/2016 Last PSA: May 2018 and requests this today Dentist: has dentures  Ophtho: every year Exercise: every other day  Other doctors caring for patient include:  Rheumatologist: Dr. Estanislado Pandy  GI: Dr. Fuller Plan  Pulmonologist: Dr. Chase Caller  Urologist: [facility]    Depression screen:  See questionnaire below.     Depression screen [facility] 2/9 08/03/2018 06/12/2018 04/23/2017 04/22/2017 01/13/2017  Decreased Interest 0 0 0 0 0  Down, Depressed, Hopeless 0 0 0 0 0  PHQ - 2 Score 0 0 0 0 0    Fall Screen: See Questionaire below.   Fall Risk  08/03/2018 06/12/2018 04/23/2017 01/13/2017 12/05/2015  Falls in the past year? No No No No No  Risk for fall due to : - - - Impaired balance/gait -  Risk for fall due to: Comment - - - after neck surgery he uses a cane -    ADL screen:  See questionnaire below.  Functional Status Survey: Is the patient deaf or have difficulty hearing?: No Does the patient have difficulty seeing, even when wearing glasses/contacts?: No Does the patient have difficulty concentrating, remembering, or making decisions?: No Does the patient have difficulty walking or climbing stairs?: Yes Does the patient have difficulty dressing or bathing?: No Does the patient have difficulty doing errands alone such as  visiting a doctor's office or shopping?: No   End of Life Discussion:  Patient does not have a living will and medical power of attorney. MOST form filled out with patient. Forms for living will and HCPOA sent home with patient. Tony Mcmillan his son or Tony Mcmillan his daughter will make his decisions for him.    Review of Systems  Constitutional: -fever, -chills, -sweats, -unexpected weight change, -anorexia, -fatigue Allergy: -sneezing, -itching, -congestion Dermatology: denies changing moles, rash, lumps, new worrisome lesions ENT: -runny nose, -ear pain, -sore throat, -hoarseness, -sinus pain, -tinnitus, -hearing loss, -epistaxis Cardiology:  -chest pain, -palpitations, -edema, -orthopnea, -paroxysmal nocturnal dyspnea Respiratory: -cough, -shortness of breath, -dyspnea on exertion, -wheezing, -hemoptysis Gastroenterology: -abdominal pain, -nausea, -vomiting, -diarrhea, -constipation, -blood in stool, -changes in bowel movement, -dysphagia Hematology: -bleeding or bruising problems Musculoskeletal: -arthralgias, -myalgias, -joint swelling, -back pain, -neck pain, -cramping, + ongoing gait changes (uses cane) Ophthalmology: -vision changes, -eye redness, -itching, -discharge Urology: -dysuria, -difficulty urinating, -hematuria, +urinary frequency, -urgency, incontinence Neurology: -headache, -weakness, -tingling, -numbness, -speech abnormality, -memory loss, -falls, -dizziness Psychology:  -depressed mood, -agitation, -sleep problems   PHYSICAL EXAM:  BP 120/70   Pulse 92   Ht 5' 2.5" (1.588 m)   Wt 152 lb 12.8 oz (69.3 kg)   BMI 27.50 kg/m   General Appearance: Alert, cooperative, no distress, appears stated age Head: Normocephalic, without obvious abnormality, atraumatic Eyes: PERRL, conjunctiva/corneas clear, EOM's intact, fundi benign Ears: Normal TM's and external ear canals Nose: Nares normal, mucosa normal, no drainage or sinus  tenderness Throat: Lips, mucosa, and tongue  normal; dentures upper and lower Neck: Supple, no lymphadenopathy, thyroid:no enlargement/tenderness/nodules; no carotid bruit or JVD Back: Spine nontender, no curvature, ROM normal, no CVA tenderness Lungs: Clear to auscultation bilaterally without wheezes, rales or ronchi; respirations unlabored Chest Wall: No tenderness or deformity Heart: Regular rate and rhythm, S1 and S2 normal, no murmur, rub or gallop Breast Exam: No chest wall tenderness, masses or gynecomastia Abdomen: Soft, non-tender, nondistended, normoactive bowel sounds, no masses, no hepatosplenomegaly Genitalia: deferred  Extremities: No clubbing, cyanosis or edema Pulses: 2+ and symmetric all extremities Skin: Skin color, texture, turgor normal, no rashes or lesions Lymph nodes: Cervical, supraclavicular, and axillary nodes normal Neurologic: CNII-XII intact, normal strength, sensation; reflexes 2+ and symmetric throughout   Psych: Normal mood, affect, hygiene and grooming  ASSESSMENT/PLAN: Medicare annual wellness visit, subsequent  Benign prostatic hyperplasia with urinary frequency - Plan: POCT Urinalysis DIP (Proadvantage Device)  Pulmonary emphysema, unspecified emphysema type ([place]) - Plan: CBC with Differential/Platelet, Comprehensive metabolic panel  Rheumatoid arthritis of multiple sites with negative rheumatoid factor ([place]) - Plan: CBC with Differential/Platelet, Comprehensive metabolic panel  Vitamin D deficiency - Plan: VITAMIN D 25 Hydroxy (Vit-D Deficiency, Fractures)  High risk medication use - Plan: CBC with Differential/Platelet, Comprehensive metabolic panel  Asymptomatic microscopic hematuria - Plan: POCT Urinalysis DIP (Proadvantage Device)  Medication management - Plan: VITAMIN D 25 Hydroxy (Vit-D Deficiency, Fractures), Lipid panel  Elevated serum creatinine - Plan: Comprehensive metabolic panel  He appears to be doing quite well emotionally and stable chronic health conditions. He will  follow-up with urology for BPH, nocturia and I will check PSA today per patient request. Vitamin D deficiency -taking vitamin D daily and I will check a vitamin D level. continue seeing rheumatology for RA Continue seeing pulmonology for emphysema MOST form filled out with patient.  Discussed advanced directives with patient and he will take home the paperwork.  He is able to take care of himself and his children visit him daily. No recent falls.  No sign of depression. Immunizations discussed.  He did have the old shingles vaccine Shingrix discussed and he will call his insurance company about this.  Colonoscopy up-to-date Follow-up pending labs   Discussed PSA screening (risks/benefits), recommended at least 30 minutes of aerobic activity at least 5 days/week; proper sunscreen use reviewed; healthy diet and alcohol recommendations (less than or equal to 2 drinks/day) reviewed; regular seatbelt use; changing batteries in smoke detectors. Immunization recommendations discussed.  Colonoscopy recommendations reviewed.   Medicare Attestation I have personally reviewed: The patient's medical and social history Their use of alcohol, tobacco or illicit drugs Their current medications and supplements The patient's functional ability including ADLs,fall risks, home safety risks, cognitive, and hearing and visual impairment Diet and physical activities Evidence for depression or mood disorders  The patient's weight, height, and BMI have been recorded in the chart.  I have made referrals, counseling, and provided education to the patient based on review of the above and I have provided the patient with a written personalized care plan for preventive services.     Harland Dingwall, NP-C   08/04/2018

## 2018-08-03 ENCOUNTER — Ambulatory Visit (INDEPENDENT_AMBULATORY_CARE_PROVIDER_SITE_OTHER): Payer: Medicare Other | Admitting: Family Medicine

## 2018-08-03 ENCOUNTER — Encounter: Payer: Self-pay | Admitting: Family Medicine

## 2018-08-03 VITALS — BP 120/70 | HR 92 | Ht 62.5 in | Wt 152.8 lb

## 2018-08-03 DIAGNOSIS — J439 Emphysema, unspecified: Secondary | ICD-10-CM | POA: Diagnosis not present

## 2018-08-03 DIAGNOSIS — R350 Frequency of micturition: Secondary | ICD-10-CM | POA: Diagnosis not present

## 2018-08-03 DIAGNOSIS — N401 Enlarged prostate with lower urinary tract symptoms: Secondary | ICD-10-CM | POA: Diagnosis not present

## 2018-08-03 DIAGNOSIS — Z79899 Other long term (current) drug therapy: Secondary | ICD-10-CM

## 2018-08-03 DIAGNOSIS — Z0000 Encounter for general adult medical examination without abnormal findings: Secondary | ICD-10-CM

## 2018-08-03 DIAGNOSIS — E559 Vitamin D deficiency, unspecified: Secondary | ICD-10-CM | POA: Diagnosis not present

## 2018-08-03 DIAGNOSIS — M0609 Rheumatoid arthritis without rheumatoid factor, multiple sites: Secondary | ICD-10-CM | POA: Diagnosis not present

## 2018-08-03 DIAGNOSIS — R7989 Other specified abnormal findings of blood chemistry: Secondary | ICD-10-CM

## 2018-08-03 DIAGNOSIS — R3121 Asymptomatic microscopic hematuria: Secondary | ICD-10-CM

## 2018-08-03 NOTE — Patient Instructions (Addendum)
Check with your insurance company and check your co-pay on the new shingles vaccine called Shingrix.   Call and schedule with [facility]. [phone]

## 2018-08-04 LAB — CBC WITH DIFFERENTIAL/PLATELET
Basophils Absolute: 0 10*3/uL (ref 0.0–0.2)
Basos: 0 %
EOS (ABSOLUTE): 0.1 10*3/uL (ref 0.0–0.4)
Eos: 1 %
Hematocrit: 43.1 % (ref 37.5–51.0)
Hemoglobin: 14.6 g/dL (ref 13.0–17.7)
Immature Grans (Abs): 0 10*3/uL (ref 0.0–0.1)
Immature Granulocytes: 0 %
Lymphocytes Absolute: 0.6 10*3/uL — ABNORMAL LOW (ref 0.7–3.1)
Lymphs: 11 %
MCH: 29 pg (ref 26.6–33.0)
MCHC: 33.9 g/dL (ref 31.5–35.7)
MCV: 86 fL (ref 79–97)
Monocytes Absolute: 0.6 10*3/uL (ref 0.1–0.9)
Monocytes: 12 %
Neutrophils Absolute: 4 10*3/uL (ref 1.4–7.0)
Neutrophils: 76 %
Platelets: 288 10*3/uL (ref 150–450)
RBC: 5.04 x10E6/uL (ref 4.14–5.80)
RDW: 18 % — ABNORMAL HIGH (ref 12.3–15.4)
WBC: 5.2 10*3/uL (ref 3.4–10.8)

## 2018-08-04 LAB — COMPREHENSIVE METABOLIC PANEL
ALT: 30 IU/L (ref 0–44)
AST: 26 IU/L (ref 0–40)
Albumin/Globulin Ratio: 1.4 (ref 1.2–2.2)
Albumin: 4.4 g/dL (ref 3.5–4.8)
Alkaline Phosphatase: 69 IU/L (ref 39–117)
BUN/Creatinine Ratio: 8 — ABNORMAL LOW (ref 10–24)
BUN: 11 mg/dL (ref 8–27)
Bilirubin Total: 0.9 mg/dL (ref 0.0–1.2)
CO2: 22 mmol/L (ref 20–29)
Calcium: 9.7 mg/dL (ref 8.6–10.2)
Chloride: 100 mmol/L (ref 96–106)
Creatinine, Ser: 1.39 mg/dL — ABNORMAL HIGH (ref 0.76–1.27)
GFR calc Af Amer: 57 mL/min/{1.73_m2} — ABNORMAL LOW (ref >59)
GFR calc non Af Amer: 49 mL/min/{1.73_m2} — ABNORMAL LOW (ref >59)
Globulin, Total: 3.2 g/dL (ref 1.5–4.5)
Glucose: 87 mg/dL (ref 65–99)
Potassium: 5.1 mmol/L (ref 3.5–5.2)
Sodium: 137 mmol/L (ref 134–144)
Total Protein: 7.6 g/dL (ref 6.0–8.5)

## 2018-08-04 LAB — POCT URINALYSIS DIP (PROADVANTAGE DEVICE)
Bilirubin, UA: NEGATIVE
Blood, UA: NEGATIVE
Glucose, UA: NEGATIVE mg/dL
Ketones, POC UA: NEGATIVE mg/dL
Leukocytes, UA: NEGATIVE
Nitrite, UA: NEGATIVE
Protein Ur, POC: NEGATIVE mg/dL
Specific Gravity, Urine: 1.03
Urobilinogen, Ur: NEGATIVE
pH, UA: 6 (ref 5.0–8.0)

## 2018-08-04 LAB — LIPID PANEL
Chol/HDL Ratio: 2.8 ratio (ref 0.0–5.0)
Cholesterol, Total: 169 mg/dL (ref 100–199)
HDL: 61 mg/dL (ref >39)
LDL Calculated: 76 mg/dL (ref 0–99)
Triglycerides: 160 mg/dL — ABNORMAL HIGH (ref 0–149)
VLDL Cholesterol Cal: 32 mg/dL (ref 5–40)

## 2018-08-04 LAB — VITAMIN D 25 HYDROXY (VIT D DEFICIENCY, FRACTURES): Vit D, 25-Hydroxy: 53.7 ng/mL (ref 30.0–100.0)

## 2018-08-06 LAB — PSA: Prostate Specific Ag, Serum: 1.8 ng/mL (ref 0.0–4.0)

## 2018-08-06 LAB — SPECIMEN STATUS REPORT

## 2018-08-10 DIAGNOSIS — R351 Nocturia: Secondary | ICD-10-CM | POA: Diagnosis not present

## 2018-08-10 DIAGNOSIS — N401 Enlarged prostate with lower urinary tract symptoms: Secondary | ICD-10-CM | POA: Diagnosis not present

## 2018-08-12 NOTE — Progress Notes (Signed)
Office Visit Note  Patient: Tony Mcmillan             Date of Birth: Aug 12, 1941           MRN: [id number]             PCP: Girtha Rm, NP-C Referring: Girtha Rm, NP-C Visit Date: 08/26/2018 Occupation: @GUAROCC @  Subjective:  Pain and swelling in both hands and wrist joints   History of Present Illness: Tony Mcmillan is a 77 y.o. male with history of seronegative rheumatoid arthritis and DDD.  Patient is taking Xeljanz 5 mg BID, MTX 0.3 ml once a week, and folic acid.  He denies any recent rheumatoid arthritis flares.  He states he continues to have swelling and discomfort in his left hand and left wrist.  He states that he has mild swelling in his right wrist at this time.  He states occasionally has discomfort in his left shoulder that radiates down into the deltoid.  He states that he does not feel any worse than normal.  He says he continues to have neck and lower back stiffness but denies any discomfort at this time.  He denies any symptoms of radiculopathy.  Activities of Daily Living:  Patient reports morning stiffness for 15 minutes.   Patient Denies nocturnal pain.  Difficulty dressing/grooming: Denies Difficulty climbing stairs: Denies Difficulty getting out of chair: Denies Difficulty using hands for taps, buttons, cutlery, and/or writing: Denies  Review of Systems  Constitutional: Negative for fatigue and night sweats.  HENT: Negative for mouth sores, trouble swallowing, trouble swallowing, mouth dryness and nose dryness.   Eyes: Negative for redness, visual disturbance and dryness.  Respiratory: Positive for cough (Dry cough). Negative for hemoptysis, shortness of breath and difficulty breathing.   Cardiovascular: Negative for chest pain, palpitations, hypertension, irregular heartbeat and swelling in legs/feet.  Gastrointestinal: Negative for blood in stool, constipation and diarrhea.  Endocrine: Negative for increased urination.  Genitourinary: Negative for  painful urination.  Musculoskeletal: Positive for arthralgias, joint pain, joint swelling and morning stiffness. Negative for myalgias, muscle weakness, muscle tenderness and myalgias.  Skin: Negative for color change, rash, hair loss, nodules/bumps, skin tightness, ulcers and sensitivity to sunlight.  Allergic/Immunologic: Negative for susceptible to infections.  Neurological: Negative for dizziness, fainting, memory loss, night sweats and weakness.  Hematological: Negative for swollen glands.  Psychiatric/Behavioral: Negative for depressed mood and sleep disturbance. The patient is not nervous/anxious.     PMFS History:  Patient Active Problem List   Diagnosis Date Noted  . History of systemic steroid therapy 10/30/2017  . Feeling of chest tightness, unclear cause, ? due to RA 02/21/2017  . Pulmonary emphysema ([place]) 12/18/2016  . Dyspnea and respiratory abnormality 11/25/2016  . 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[place]) 11/04/2016  . High risk medication use 11/04/2016  . 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 Vitamin D deficiency 11/04/2016  . 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 History of anemia 07/02/2016  . History of colonic polyps 07/02/2016  . History of gastric ulcer 07/02/2016  . Asymptomatic microscopic hematuria 04/03/2016  . Benign prostatic hyperplasia 04/03/2016  . Renal cyst, left 04/03/2016  . 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 DDD (degenerative disc disease), lumbar 04/03/2016  . Colonic polyp 02/23/2016  . Duodenal ulcer 02/23/2016    Past Medical History:  Diagnosis Date  . Anemia   . Atypical chest pain    a. 09/2016 MV: EF 63%, normal perfusion.  . Blood transfusion without  reported diagnosis   . Diastolic dysfunction    a. 2015 Echo: EF >55%;  b. 09/2016 Echo: EF 55-60%, no rwma, Gr1 DD, triv AI/MR, mild TR, PASP 61mmHg.  . Emphysema of lung ([place]) 02/08/2016  . GERD (gastroesophageal reflux disease)   . History of  systemic steroid therapy 10/30/2017  . Rheumatoid arthritis ([place])     Family History  Problem Relation Age of Onset  . Hypertension Mother   . Hypertension Father   . Diabetes Father   . Hyperlipidemia Sister   . Diabetes Sister   . Stroke Brother   . Diabetes Brother   . Colon polyps Neg Hx   . Esophageal cancer Neg Hx   . Stomach cancer Neg Hx   . Rectal cancer Neg Hx   . Colon cancer Neg Hx    Past Surgical History:  Procedure Laterality Date  . ANTERIOR CERVICAL CORPECTOMY    . ANTERIOR CERVICAL CORPECTOMY  12/2014   for infection. this was in [place]  . CATARACT EXTRACTION     both eyes  . COLOSTOMY REVERSAL     had infection on buttocks and had to have a skin graft- had colostomy to help area stay clean and heal  . HERNIA REPAIR    . SPINE SURGERY     Social History   Social History Narrative   Retired. Lives alone.     Objective: Vital Signs: BP 119/69 (BP Location: Left Arm, Patient Position: Sitting, Cuff Size: Normal)   Pulse 73   Resp 13   Ht 5\' 2"  (1.575 m)   Wt 152 lb 6.4 oz (69.1 kg)   BMI 27.87 kg/m    Physical Exam  Constitutional: He is oriented to person, place, and time. He appears well-developed and well-nourished.  HENT:  Head: Normocephalic and atraumatic.  Eyes: Pupils are equal, round, and reactive to light. Conjunctivae and EOM are normal.  Neck: Normal range of motion. Neck supple.  Cardiovascular: Normal rate, regular rhythm and normal heart sounds.  Pulmonary/Chest: Effort normal and breath sounds normal.  Abdominal: Soft. Bowel sounds are normal.  Lymphadenopathy:    He has no cervical adenopathy.  Neurological: He is alert and oriented to person, place, and time.  Skin: Skin is warm and dry. Capillary refill takes less than 2 seconds.  Psychiatric: He has a normal mood and affect. His behavior is normal.  Nursing note and vitals reviewed.    Musculoskeletal Exam: C-spine limited ROM.  Mild thoracic kyphosis.  No midline spinal  tenderness.  No SI joint tenderness.  Bilateral shoulder abduction to 90 degrees.  Elbow joints good range of motion.   Extensor tenosynovitis of  left wrist.  Mild extensor tenosynovitis of right wrist.  MCPs, PIPs, DIPs good range of motion no synovitis.  He has complete fist formation bilaterally.  Hip joints, knee joints, ankle joints, MTPs, PIPs, DIPs good range of motion no synovitis.  No warmth or effusion bilateral knee joints.  CDAI Exam: CDAI Score: 4.6  Patient Global Assessment: 3 (mm); Provider Global Assessment: 3 (mm) Swollen: 2 ; Tender: 2  Joint Exam      Right  Left  Wrist  Swollen Tender  Swollen Tender     Investigation: No additional findings.  Imaging: No results found.  Recent Labs: Lab Results  Component Value Date   WBC 5.2 08/03/2018   HGB 14.6 08/03/2018   PLT 288 08/03/2018   NA 137 08/03/2018   K 5.1 08/03/2018   CL 100 08/03/2018  CO2 22 08/03/2018   GLUCOSE 87 08/03/2018   BUN 11 08/03/2018   CREATININE 1.39 (H) 08/03/2018   BILITOT 0.9 08/03/2018   ALKPHOS 69 08/03/2018   AST 26 08/03/2018   ALT 30 08/03/2018   PROT 7.6 08/03/2018   ALBUMIN 4.4 08/03/2018   CALCIUM 9.7 08/03/2018   GFRAA 57 (L) 08/03/2018    Speciality Comments: No specialty comments available.  Procedures:  Medium Joint Inj: L intercarpal on 08/26/2018 10:43 AM Indications: pain and joint swelling Details: 27 G 1.5 in needle, dorsal approach Medications: 1 mL lidocaine 1 %; 30 mg triamcinolone acetonide 40 MG/ML Aspirate: 0 mL Outcome: tolerated well, no immediate complications Procedure, treatment alternatives, risks and benefits explained, specific risks discussed. Consent was given by the patient. Immediately prior to procedure a time out was called to verify the correct patient, procedure, equipment, support staff and site/side marked as required. Patient was prepped and draped in the usual sterile fashion.     Allergies: Patient has no known allergies.    Assessment / Plan:     Visit Diagnoses: Rheumatoid arthritis of multiple sites with negative rheumatoid factor [facility]): He has bilateral extensor tenosynovitis.  He has tenderness of bilateral wrist joints.  He continues to take Xeljanz 5 mg twice daily and methotrexate 0.3 mL once weekly.  He has not missed any doses recently and does not need any refills at this time.  A left intercarpal cortisone injection was performed today in the office.  He tolerated the procedure well.  Potential side effects were discussed.  He was also given a prescription for a left wrist brace.  He will follow-up in the office in 3 months.  He is advised to notify us if he develops increased joint pain or joint swelling.  High risk medication use - xeljanz, MTX, folic acid.  CBC and CMP were drawn on 08/03/2018.  TB gold was drawn today.  He will return in November and every 3 months for CBC and CMP to monitor for drug toxicity.- Plan: QuantiFERON-TB Gold Plus  DDD (degenerative disc disease), cervical: He has had a limited range of motion.  He has no symptoms of radiculopathy at this time.  DDD (degenerative disc disease), lumbar: No midline spinal tenderness.  Other medical conditions are listed as follows:  History of vitamin D deficiency  Asymptomatic microscopic hematuria  Dyslipidemia - Patient is on Lipitor.  History of BPH  History of COPD  History of colonic polyps  History of gastric ulcer   Orders: Orders Placed This Encounter  Procedures  . Medium Joint Inj  . QuantiFERON-TB Gold Plus   No orders of the defined types were placed in this encounter.   Face-to-face time spent with patient was 30 minutes. Greater than 50% of time was spent in counseling and coordination of care.  Follow-Up Instructions: Return in about 3 months (around 11/25/2018) for Rheumatoid arthritis, DDD.   Ofilia Neas, PA-C I examined and evaluated the patient with [name] PA.  Patient is doing better on  Xeljanz and methotrexate combination.  He had synovitis on bilateral wrist joints on examination today.  More prominent in the left wrist joint.  We decided to inject his left wrist joint with the cortisone.  He tolerated the procedure well.  The procedure as described above.  The plan of care was discussed as noted above.  Bo Merino, MD Note - This record has been created using Editor, commissioning.  Chart creation errors have been sought, but may  not always  have been located. Such creation errors do not reflect on  the standard of medical care.

## 2018-08-13 ENCOUNTER — Other Ambulatory Visit: Payer: Self-pay | Admitting: Internal Medicine

## 2018-08-26 ENCOUNTER — Encounter: Payer: Self-pay | Admitting: Rheumatology

## 2018-08-26 ENCOUNTER — Ambulatory Visit (INDEPENDENT_AMBULATORY_CARE_PROVIDER_SITE_OTHER): Payer: Medicare Other | Admitting: Rheumatology

## 2018-08-26 VITALS — BP 119/69 | HR 73 | Resp 13 | Ht 62.0 in | Wt 152.4 lb

## 2018-08-26 DIAGNOSIS — M5136 Other intervertebral disc degeneration, lumbar region: Secondary | ICD-10-CM | POA: Diagnosis not present

## 2018-08-26 DIAGNOSIS — Z8709 Personal history of other diseases of the respiratory system: Secondary | ICD-10-CM

## 2018-08-26 DIAGNOSIS — M0609 Rheumatoid arthritis without rheumatoid factor, multiple sites: Secondary | ICD-10-CM

## 2018-08-26 DIAGNOSIS — Z79899 Other long term (current) drug therapy: Secondary | ICD-10-CM | POA: Diagnosis not present

## 2018-08-26 DIAGNOSIS — Z8639 Personal history of other endocrine, nutritional and metabolic disease: Secondary | ICD-10-CM

## 2018-08-26 DIAGNOSIS — Z87438 Personal history of other diseases of male genital organs: Secondary | ICD-10-CM

## 2018-08-26 DIAGNOSIS — Z8719 Personal history of other diseases of the digestive system: Secondary | ICD-10-CM | POA: Diagnosis not present

## 2018-08-26 DIAGNOSIS — M25532 Pain in left wrist: Secondary | ICD-10-CM | POA: Diagnosis not present

## 2018-08-26 DIAGNOSIS — E785 Hyperlipidemia, unspecified: Secondary | ICD-10-CM | POA: Diagnosis not present

## 2018-08-26 DIAGNOSIS — Z86010 Personal history of colonic polyps: Secondary | ICD-10-CM | POA: Diagnosis not present

## 2018-08-26 DIAGNOSIS — R3121 Asymptomatic microscopic hematuria: Secondary | ICD-10-CM

## 2018-08-26 DIAGNOSIS — M5030 Other cervical disc degeneration, unspecified cervical region: Secondary | ICD-10-CM | POA: Diagnosis not present

## 2018-08-26 DIAGNOSIS — Z8711 Personal history of peptic ulcer disease: Secondary | ICD-10-CM

## 2018-08-26 MED ORDER — TRIAMCINOLONE ACETONIDE 40 MG/ML IJ SUSP
30.0000 mg | INTRAMUSCULAR | Status: AC | PRN
  Administered 2018-08-26: 30 mg via INTRA_ARTICULAR

## 2018-08-26 MED ORDER — LIDOCAINE HCL 1 % IJ SOLN
1.0000 mL | INTRAMUSCULAR | Status: AC | PRN
  Administered 2018-08-26: 1 mL

## 2018-08-26 NOTE — Patient Instructions (Signed)
Standing Labs We placed an order today for your standing lab work.    Please come back and get your standing labs in November and every 3 months   We have open lab Monday through Friday from 8:30-11:30 AM and 1:30-4:00 PM  at the office of Dr. Shaili Deveshwar.   You may experience shorter wait times on Monday and Friday afternoons. The office is located at [address], [address], [place], [place] [address] No appointment is necessary.   Labs are drawn by [facility].  You may receive a bill from [facility] for your lab work. If you have any questions regarding directions or hours of operation,  please call [phone].     

## 2018-08-28 ENCOUNTER — Telehealth: Payer: Self-pay | Admitting: *Deleted

## 2018-08-28 DIAGNOSIS — Z9225 Personal history of immunosupression therapy: Secondary | ICD-10-CM

## 2018-08-28 LAB — QUANTIFERON-TB GOLD PLUS
Mitogen-NIL: 0.09 IU/mL
NIL: 0.02 IU/mL
QuantiFERON-TB Gold Plus: UNDETERMINED — AB
TB1-NIL: 0 IU/mL
TB2-NIL: 0 IU/mL

## 2018-08-28 NOTE — Progress Notes (Signed)
TB gold test is indeterminate.  Please advise patient to return for repeat TB gold test.

## 2018-08-28 NOTE — Telephone Encounter (Signed)
-----   Message from Ofilia Neas, PA-C sent at 08/28/2018  1:50 PM EDT ----- TB gold test is indeterminate.  Please advise patient to return for repeat TB gold test.

## 2018-08-31 ENCOUNTER — Other Ambulatory Visit: Payer: Self-pay

## 2018-08-31 DIAGNOSIS — Z9225 Personal history of immunosupression therapy: Secondary | ICD-10-CM

## 2018-09-02 ENCOUNTER — Telehealth: Payer: Self-pay | Admitting: Rheumatology

## 2018-09-02 LAB — QUANTIFERON-TB GOLD PLUS
Mitogen-NIL: 5.95 IU/mL
NIL: 0.03 IU/mL
QuantiFERON-TB Gold Plus: NEGATIVE
TB1-NIL: 0 IU/mL
TB2-NIL: 0 IU/mL

## 2018-09-02 NOTE — Telephone Encounter (Signed)
Patient advised Tb Gold results are negative.

## 2018-09-02 NOTE — Telephone Encounter (Signed)
Patient called stating that he received his test results but is not sure what it means.  He would like returned call to go over those results.  Thank you.  CB#[phone].

## 2018-09-09 DIAGNOSIS — R351 Nocturia: Secondary | ICD-10-CM | POA: Diagnosis not present

## 2018-09-17 ENCOUNTER — Other Ambulatory Visit: Payer: Self-pay | Admitting: Rheumatology

## 2018-09-17 NOTE — Telephone Encounter (Signed)
Last Visit: 08/26/18 Next visit: 11/25/18 Labs: 08/03/18 Creat 1.39 GFR 57 Previous 1.34 GFR 58  Okay to refill MTX?

## 2018-09-17 NOTE — Telephone Encounter (Signed)
ok 

## 2018-09-19 ENCOUNTER — Other Ambulatory Visit: Payer: Self-pay | Admitting: Rheumatology

## 2018-09-21 NOTE — Telephone Encounter (Signed)
Last Visit: 08/26/18 Next visit: 11/25/18 Labs: 08/03/18 Creat 1.39 GFR 57 Previous 1.34 GFR 58  Okay to refill per Dr. Estanislado Pandy

## 2018-09-28 ENCOUNTER — Other Ambulatory Visit: Payer: Self-pay

## 2018-09-28 DIAGNOSIS — E7800 Pure hypercholesterolemia, unspecified: Secondary | ICD-10-CM

## 2018-09-28 MED ORDER — ATORVASTATIN CALCIUM 10 MG PO TABS
10.0000 mg | ORAL_TABLET | Freq: Every day | ORAL | 1 refills | Status: DC
Start: 2018-09-28 — End: 2019-02-09

## 2018-09-28 NOTE — Telephone Encounter (Signed)
Pt is requesting a 90 day supply of Atorvastatin to be sent to [facility].

## 2018-10-08 ENCOUNTER — Other Ambulatory Visit: Payer: Self-pay | Admitting: Family Medicine

## 2018-10-08 DIAGNOSIS — E7800 Pure hypercholesterolemia, unspecified: Secondary | ICD-10-CM

## 2018-10-09 ENCOUNTER — Telehealth: Payer: Self-pay | Admitting: *Deleted

## 2018-10-09 ENCOUNTER — Encounter: Payer: Self-pay | Admitting: Physician Assistant

## 2018-10-09 ENCOUNTER — Telehealth: Payer: Self-pay | Admitting: Rheumatology

## 2018-10-09 ENCOUNTER — Ambulatory Visit (INDEPENDENT_AMBULATORY_CARE_PROVIDER_SITE_OTHER): Payer: Medicare Other | Admitting: Physician Assistant

## 2018-10-09 VITALS — BP 110/73 | HR 92 | Resp 14 | Ht 61.0 in | Wt 148.6 lb

## 2018-10-09 DIAGNOSIS — M5030 Other cervical disc degeneration, unspecified cervical region: Secondary | ICD-10-CM

## 2018-10-09 DIAGNOSIS — M0609 Rheumatoid arthritis without rheumatoid factor, multiple sites: Secondary | ICD-10-CM

## 2018-10-09 DIAGNOSIS — M791 Myalgia, unspecified site: Secondary | ICD-10-CM

## 2018-10-09 DIAGNOSIS — Z79899 Other long term (current) drug therapy: Secondary | ICD-10-CM

## 2018-10-09 DIAGNOSIS — Z8719 Personal history of other diseases of the digestive system: Secondary | ICD-10-CM | POA: Diagnosis not present

## 2018-10-09 DIAGNOSIS — Z862 Personal history of diseases of the blood and blood-forming organs and certain disorders involving the immune mechanism: Secondary | ICD-10-CM

## 2018-10-09 DIAGNOSIS — R3121 Asymptomatic microscopic hematuria: Secondary | ICD-10-CM | POA: Diagnosis not present

## 2018-10-09 DIAGNOSIS — Z8639 Personal history of other endocrine, nutritional and metabolic disease: Secondary | ICD-10-CM

## 2018-10-09 DIAGNOSIS — Z8709 Personal history of other diseases of the respiratory system: Secondary | ICD-10-CM

## 2018-10-09 DIAGNOSIS — Z87438 Personal history of other diseases of male genital organs: Secondary | ICD-10-CM | POA: Diagnosis not present

## 2018-10-09 DIAGNOSIS — M5136 Other intervertebral disc degeneration, lumbar region: Secondary | ICD-10-CM | POA: Diagnosis not present

## 2018-10-09 DIAGNOSIS — Z86010 Personal history of colonic polyps: Secondary | ICD-10-CM | POA: Diagnosis not present

## 2018-10-09 DIAGNOSIS — R7989 Other specified abnormal findings of blood chemistry: Secondary | ICD-10-CM

## 2018-10-09 DIAGNOSIS — E785 Hyperlipidemia, unspecified: Secondary | ICD-10-CM

## 2018-10-09 DIAGNOSIS — Z8711 Personal history of peptic ulcer disease: Secondary | ICD-10-CM

## 2018-10-09 MED ORDER — DICLOFENAC SODIUM 1 % TD GEL: TRANSDERMAL | 3 refills | Status: DC

## 2018-10-09 NOTE — Telephone Encounter (Signed)
Patient left a voicemail stating he has "a knot on his arm" and is checking if he should make an appointment with Dr. Estanislado Pandy or call his PCP.  Patient requested a return call.

## 2018-10-09 NOTE — Telephone Encounter (Signed)
Patient states he has a knot up near his right shoulder. Patient states it has been hurting for a couple of days. Patient states he has not injured his arm. Patient states the pain is between his elbow and his shoulder. Patient states his left wrist is swelling. Patient states he has swelling in his right arm some. Patient has been schedule for 10/09/18 at 11 am.

## 2018-10-09 NOTE — Telephone Encounter (Signed)
Patient called back to make sure we received message. Patient anxious to speak to someone stating he wants to make sure he does not have a blood clot.

## 2018-10-09 NOTE — Progress Notes (Signed)
Office Visit Note  Patient: Tony Mcmillan             Date of Birth: 1941-02-07           MRN: [id number]             PCP: Girtha Rm, NP-C Referring: Girtha Rm, NP-C Visit Date: 10/09/2018 Occupation: @GUAROCC @  Subjective:  Muscle tenderness   History of Present Illness: Tony Mcmillan is a 77 y.o. male with history of seronegative rheumatoid arthritis and DDD. He is on Xeljanz 5 mg by mouth BID and MTX 0.3 ml sq injections once weekly.  He denies missing any doses of his medications recently.  He states that after his last visit when he had a left wrist cortisone injection his pain and swelling improved significantly.  He states that he is having some swelling and tenderness in the left wrist today.  He presents today with muscle tenderness and "knots" in bilateral upper arms.  He denies any injuries or changes in activities.  He denies lifting any heavy objects.  He denies any bruising or erythema.  He reports he is having some tenderness when sleeping last night.  He has not applied anything topically.  He has not used heat or ice.  He denies any other muscle aches or muscle tenderness.  He reports that he has been on statin therapy for less than 1 year.  He has not tried Voltaren gel in the past.  He reports that his lower back is doing well.  He states that he has had some neck stiffness.  Activities of Daily Living:  Patient reports morning stiffness for 5  minutes.   Patient Reports nocturnal pain.  Difficulty dressing/grooming: Denies Difficulty climbing stairs: Denies Difficulty getting out of chair: Denies Difficulty using hands for taps, buttons, cutlery, and/or writing: Reports  Review of Systems  Constitutional: Negative for fatigue and night sweats.  HENT: Negative for mouth sores, trouble swallowing, trouble swallowing, mouth dryness and nose dryness.   Eyes: Negative for redness, visual disturbance and dryness.  Respiratory: Positive for cough. Negative for  hemoptysis, shortness of breath and difficulty breathing.   Cardiovascular: Negative for chest pain, palpitations, hypertension, irregular heartbeat and swelling in legs/feet.  Gastrointestinal: Positive for constipation. Negative for blood in stool and diarrhea.  Endocrine: Negative for increased urination.  Genitourinary: Negative for painful urination.  Musculoskeletal: Positive for arthralgias, joint pain, joint swelling, myalgias, morning stiffness, muscle tenderness and myalgias. Negative for muscle weakness.  Skin: Negative for color change, rash, hair loss, nodules/bumps, skin tightness, ulcers and sensitivity to sunlight.  Allergic/Immunologic: Negative for susceptible to infections.  Neurological: Negative for dizziness, fainting, memory loss, night sweats and weakness.  Hematological: Negative for swollen glands.  Psychiatric/Behavioral: Negative for depressed mood and sleep disturbance. The patient is not nervous/anxious.     PMFS History:  Patient Active Problem List   Diagnosis Date Noted  . History of systemic steroid therapy 10/30/2017  . Feeling of chest tightness, unclear cause, ? due to RA 02/21/2017  . Pulmonary emphysema ([place]) 12/18/2016  . Dyspnea and respiratory abnormality 11/25/2016  . 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[place]) 11/04/2016  . High risk medication use 11/04/2016  . 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 Vitamin D deficiency 11/04/2016  . 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 History of anemia 07/02/2016  . History of colonic polyps 07/02/2016  . History of gastric ulcer 07/02/2016  . Asymptomatic microscopic hematuria 04/03/2016  . Benign prostatic hyperplasia  04/03/2016  . Renal cyst, left 04/03/2016  . 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 DDD (degenerative disc disease), lumbar 04/03/2016  . Colonic polyp 02/23/2016  . Duodenal ulcer 02/23/2016    Past Medical History:  Diagnosis Date  . Anemia   . Atypical  chest pain    a. 09/2016 MV: EF 63%, normal perfusion.  . Blood transfusion without reported diagnosis   . Diastolic dysfunction    a. 2015 Echo: EF >55%;  b. 09/2016 Echo: EF 55-60%, no rwma, Gr1 DD, triv AI/MR, mild TR, PASP 24mmHg.  . Emphysema of lung ([place]) 02/08/2016  . GERD (gastroesophageal reflux disease)   . History of systemic steroid therapy 10/30/2017  . Rheumatoid arthritis ([place])     Family History  Problem Relation Age of Onset  . Hypertension Mother   . Hypertension Father   . Diabetes Father   . Hyperlipidemia Sister   . Diabetes Sister   . Stroke Brother   . Diabetes Brother   . Colon polyps Neg Hx   . Esophageal cancer Neg Hx   . Stomach cancer Neg Hx   . Rectal cancer Neg Hx   . Colon cancer Neg Hx    Past Surgical History:  Procedure Laterality Date  . ANTERIOR CERVICAL CORPECTOMY    . ANTERIOR CERVICAL CORPECTOMY  12/2014   for infection. this was in [place]  . CATARACT EXTRACTION     both eyes  . COLOSTOMY REVERSAL     had infection on buttocks and had to have a skin graft- had colostomy to help area stay clean and heal  . HERNIA REPAIR    . SPINE SURGERY     Social History   Social History Narrative   Retired. Lives alone.     Objective: Vital Signs: BP 110/73 (BP Location: Left Arm, Patient Position: Sitting, Cuff Size: Small)   Pulse 92   Resp 14   Ht 5\' 1"  (1.549 m)   Wt 148 lb 9.6 oz (67.4 kg)   BMI 28.08 kg/m    Physical Exam  Constitutional: He is oriented to person, place, and time. He appears well-developed and well-nourished.  HENT:  Head: Normocephalic and atraumatic.  Eyes: Pupils are equal, round, and reactive to light. Conjunctivae and EOM are normal.  Neck: Normal range of motion. Neck supple.  Cardiovascular: Normal rate, regular rhythm and normal heart sounds.  Pulmonary/Chest: Effort normal and breath sounds normal.  Abdominal: Soft. Bowel sounds are normal.  Lymphadenopathy:    He has no cervical adenopathy.    Neurological: He is alert and oriented to person, place, and time.  Skin: Skin is warm and dry. Capillary refill takes less than 2 seconds.  Psychiatric: He has a normal mood and affect. His behavior is normal.  Nursing note and vitals reviewed.    Musculoskeletal Exam: C-spine limited range of motion.  Mild thoracic kyphosis.  No midline spinal tenderness.  No SI joint tenderness.  Shoulder abduction to 120 degrees bilaterally.  Deltoid insertion site tenderness bilaterally.  Abdomen is good range of motion with no discomfort.  Extensor tenosynovitis of bilateral wrists worse in the left.  MCPs, PIPs, DIPs good range of motion no synovitis.  He has complete fist formation bilaterally.  Hip joints, knee joints, ankle joints, MTPs, PIPs, DIPs good range of motion no synovitis.  No warmth or effusion of bilateral knee joints.  No muscle tenderness of lower extremities.   CDAI Exam: CDAI Score: 5.4  Patient Global Assessment: 8 (mm);  Provider Global Assessment: 6 (mm) Swollen: 2 ; Tender: 2  Joint Exam      Right  Left  Wrist  Swollen Tender  Swollen Tender     Investigation: No additional findings.  Imaging: No results found.  Recent Labs: Lab Results  Component Value Date   WBC 5.2 08/03/2018   HGB 14.6 08/03/2018   PLT 288 08/03/2018   NA 137 08/03/2018   K 5.1 08/03/2018   CL 100 08/03/2018   CO2 22 08/03/2018   GLUCOSE 87 08/03/2018   BUN 11 08/03/2018   CREATININE 1.39 (H) 08/03/2018   BILITOT 0.9 08/03/2018   ALKPHOS 69 08/03/2018   AST 26 08/03/2018   ALT 30 08/03/2018   PROT 7.6 08/03/2018   ALBUMIN 4.4 08/03/2018   CALCIUM 9.7 08/03/2018   GFRAA 57 (L) 08/03/2018   QFTBGOLDPLUS NEGATIVE 08/31/2018    Speciality Comments: No specialty comments available.  Procedures:  No procedures performed Allergies: Patient has no known allergies.   Assessment / Plan:     Visit Diagnoses: Rheumatoid arthritis of multiple sites with negative rheumatoid factor [facility]):  He has extensor tenosynovitis of bilateral wrists joints.  He had a left intercarpal cortisone injection performed on 08/26/2018 which provided significant relief.  He continues take Xeljanz 5 mg by mouth twice daily and methotrexate 0.3 mL subcutaneously once weekly.  He denies missing any doses recently.  He was given a prescription for Voltaren gel which she can apply topically 3 times daily to bilateral wrists.  We will check CBC and CMP today to monitor for drug toxicity.  He has a follow-up appointment scheduled on 11/25/2018.  He was advised to notify us if he develops increased joint pain or joint swelling.  High risk medication use - Xeljanz 5 mg twice daily and methotrexate 0.3 mL once weekly. -CBC and CMP will be drawn today to monitor for drug toxicity.  Plan: CBC with Differential/Platelet, COMPLETE METABOLIC PANEL WITH GFR  DDD (degenerative disc disease), cervical: Limited range of motion of the C-spine.  No symptoms of radiculopathy at this time.  DDD (degenerative disc disease), lumbar: Doing well.  No midline spinal tenderness.  He has no discomfort in his lower back at this time.  History of vitamin D deficiency: He takes a calcium vitamin D supplement on a daily basis.  Myalgia -Tenderness at the deltoid insertion site bilaterally.  The tenderness started about 2 days ago.  No ecchymosis or erythema noted.  He has full strength of bilateral upper extremities.  No Popeye sign noted.  He has shoulder abduction to 120 degrees bilaterally.  He has chronic shoulder stiffness but no discomfort with range of motion.  He has been on statin therapy for less than 1 year.  He denies any other generalized muscle aches or muscle tenderness at this time.  We will check a CK, sed rate, and HMG Co. a reductase test today.  He was also given a prescription for Voltaren gel which he can apply topically.  He was advised to notify us if he develops any new or worsening symptoms. plan: CK, Sedimentation rate,  Other/Misc lab test  Other medical conditions are listed as follows:  Asymptomatic microscopic hematuria  Dyslipidemia  History of BPH  History of COPD  History of colonic polyps  History of gastric ulcer  Elevated serum creatinine  History of anemia     Orders: Orders Placed This Encounter  Procedures  . CBC with Differential/Platelet  . COMPLETE METABOLIC PANEL WITH GFR  .  CK  . Sedimentation rate  . Other/Misc lab test   Meds ordered this encounter  Medications  . diclofenac sodium (VOLTAREN) 1 % GEL    Sig: Apply 3 grams to 3 large joints up to 3 times daily    Dispense:  3 Tube    Refill:  3    Face-to-face time spent with patient was 30 minutes. Greater than 50% of time was spent in counseling and coordination of care.  Follow-Up Instructions: Return for Rheumatoid arthritis, DDD.   Ofilia Neas, PA-C  Note - This record has been created using Dragon software.  Chart creation errors have been sought, but may not always  have been located. Such creation errors do not reflect on  the standard of medical care.

## 2018-10-09 NOTE — Telephone Encounter (Signed)
Prior Authorization for Voltaren Gel submitted via cover my meds. Will update once response received.

## 2018-10-13 DIAGNOSIS — Z79899 Other long term (current) drug therapy: Secondary | ICD-10-CM | POA: Diagnosis not present

## 2018-10-13 DIAGNOSIS — M791 Myalgia, unspecified site: Secondary | ICD-10-CM | POA: Diagnosis not present

## 2018-10-14 NOTE — Telephone Encounter (Signed)
Patient request a call back to discuss medication again. Patient thinks he misunderstood what medication you were referring to.

## 2018-10-14 NOTE — Telephone Encounter (Signed)
Prior Authorization for Voltaren Gel has been denied. Patient advised. Patient states he picked up a tube of the medication and when reading the side effects he has elected not to take the medication.

## 2018-10-14 NOTE — Telephone Encounter (Signed)
Patient states he thought when we discussed medication it was the [place]. Patient advised it is the Voltaren Gel that was denied and patient states that is the medication he does not want to use due to side effects.

## 2018-10-20 ENCOUNTER — Encounter: Payer: Self-pay | Admitting: Rheumatology

## 2018-10-20 ENCOUNTER — Encounter: Payer: Self-pay | Admitting: Family Medicine

## 2018-10-20 LAB — CBC WITH DIFFERENTIAL/PLATELET
Basophils Absolute: 29 cells/uL (ref 0–200)
Basophils Relative: 0.6 %
Eosinophils Absolute: 29 cells/uL (ref 15–500)
Eosinophils Relative: 0.6 %
HCT: 41 % (ref 38.5–50.0)
Hemoglobin: 14.1 g/dL (ref 13.2–17.1)
Lymphs Abs: 686 cells/uL — ABNORMAL LOW (ref 850–3900)
MCH: 29 pg (ref 27.0–33.0)
MCHC: 34.4 g/dL (ref 32.0–36.0)
MCV: 84.2 fL (ref 80.0–100.0)
MPV: 10.2 fL (ref 7.5–12.5)
Monocytes Relative: 10.7 %
Neutro Abs: 3631 cells/uL (ref 1500–7800)
Neutrophils Relative %: 74.1 %
Platelets: 254 10*3/uL (ref 140–400)
RBC: 4.87 10*6/uL (ref 4.20–5.80)
RDW: 16.2 % — ABNORMAL HIGH (ref 11.0–15.0)
Total Lymphocyte: 14 %
WBC mixed population: 524 cells/uL (ref 200–950)
WBC: 4.9 10*3/uL (ref 3.8–10.8)

## 2018-10-20 LAB — COMPLETE METABOLIC PANEL WITH GFR
AG Ratio: 1.6 (calc) (ref 1.0–2.5)
ALT: 38 U/L (ref 9–46)
AST: 29 U/L (ref 10–35)
Albumin: 4.2 g/dL (ref 3.6–5.1)
Alkaline phosphatase (APISO): 63 U/L (ref 40–115)
BUN/Creatinine Ratio: 12 (calc) (ref 6–22)
BUN: 16 mg/dL (ref 7–25)
CO2: 26 mmol/L (ref 20–32)
Calcium: 9.1 mg/dL (ref 8.6–10.3)
Chloride: 103 mmol/L (ref 98–110)
Creat: 1.31 mg/dL — ABNORMAL HIGH (ref 0.70–1.18)
GFR, Est African American: 61 mL/min/{1.73_m2} (ref >=60)
GFR, Est Non African American: 53 mL/min/{1.73_m2} — ABNORMAL LOW (ref >=60)
Globulin: 2.7 g/dL (calc) (ref 1.9–3.7)
Glucose, Bld: 106 mg/dL — ABNORMAL HIGH (ref 65–99)
Potassium: 4.5 mmol/L (ref 3.5–5.3)
Sodium: 138 mmol/L (ref 135–146)
Total Bilirubin: 0.9 mg/dL (ref 0.2–1.2)
Total Protein: 6.9 g/dL (ref 6.1–8.1)

## 2018-10-20 LAB — CK: Total CK: 262 U/L — ABNORMAL HIGH (ref 44–196)

## 2018-10-20 LAB — SEDIMENTATION RATE: Sed Rate: 9 mm/h (ref 0–20)

## 2018-10-20 LAB — ANTI-HMGCR AB IGG: ANTI HMGCR AB IGG: <20 Units (ref <20)

## 2018-10-20 NOTE — Progress Notes (Signed)
CBC stable. Glucose is 106.  Creatinine and GFR stable. CK is 262.  Please repeat CK 1 month from initial Ck.  Sed rate normal. Anti-HMGCR antibody negative.  Please ask if the voltaren gel has been helping?

## 2018-10-21 ENCOUNTER — Telehealth: Payer: Self-pay | Admitting: *Deleted

## 2018-10-21 ENCOUNTER — Encounter: Payer: Self-pay | Admitting: Family Medicine

## 2018-10-21 DIAGNOSIS — R748 Abnormal levels of other serum enzymes: Secondary | ICD-10-CM

## 2018-10-21 NOTE — Telephone Encounter (Signed)
-----   Message from Ofilia Neas, PA-C sent at 10/20/2018  4:45 PM EDT ----- CBC stable. Glucose is 106.  Creatinine and GFR stable. CK is 262.  Please repeat CK 1 month from initial Ck.  Sed rate normal. Anti-HMGCR antibody negative.  Please ask if the voltaren gel has been helping?

## 2018-10-26 ENCOUNTER — Encounter: Payer: Self-pay | Admitting: Rheumatology

## 2018-10-26 ENCOUNTER — Encounter: Payer: Self-pay | Admitting: Family Medicine

## 2018-10-29 ENCOUNTER — Encounter: Payer: Self-pay | Admitting: Family Medicine

## 2018-10-29 ENCOUNTER — Ambulatory Visit (INDEPENDENT_AMBULATORY_CARE_PROVIDER_SITE_OTHER): Payer: Medicare Other | Admitting: Family Medicine

## 2018-10-29 VITALS — BP 130/80 | HR 83 | Wt 151.0 lb

## 2018-10-29 DIAGNOSIS — R7303 Prediabetes: Secondary | ICD-10-CM | POA: Insufficient documentation

## 2018-10-29 HISTORY — DX: Prediabetes: R73.03

## 2018-10-29 LAB — POCT GLYCOSYLATED HEMOGLOBIN (HGB A1C): Hemoglobin A1C: 6.4 % — AB (ref 4.0–5.6)

## 2018-10-29 NOTE — Patient Instructions (Signed)
Your hemoglobin A1c is 6.4% and this is prediabetes.  The hemoglobin A1c is a blood test that tells me your average daily blood sugar for the past 3 months.  5.7-6.4 is prediabetes and 6.5 and higher is considered diabetes.   You do not have to check your blood sugar at home but if you do. Normal fasting blood sugar is 70-100. You may see blood sugars over 100 since you have prediabetes. If you see blood sugars higher than 150 on a regular basis then you should come in to see me.    Cut back on sugary foods and cut back on carbohydrates such as potatoes, rice, pasta, bread.  Stay physically active.   Follow up with me in 4 months.   Prediabetes Eating Plan Prediabetes-also called impaired glucose tolerance or impaired fasting glucose-is a condition that causes blood sugar (blood glucose) levels to be higher than normal. Following a healthy diet can help to keep prediabetes under control. It can also help to lower the risk of type 2 diabetes and heart disease, which are increased in people who have prediabetes. Along with regular exercise, a healthy diet:  Promotes weight loss.  Helps to control blood sugar levels.  Helps to improve the way that the body uses insulin.  What do I need to know about this eating plan?  Use the glycemic index (GI) to plan your meals. The index tells you how quickly a food will raise your blood sugar. Choose low-GI foods. These foods take a longer time to raise blood sugar.  Pay close attention to the amount of carbohydrates in the food that you eat. Carbohydrates increase blood sugar levels.  Keep track of how many calories you take in. Eating the right amount of calories will help you to achieve a healthy weight. Losing about 7 percent of your starting weight can help to prevent type 2 diabetes.  You may want to follow a Mediterranean diet. This diet includes a lot of vegetables, lean meats or fish, whole grains, fruits, and healthy oils and fats. What  foods can I eat? Grains Whole grains, such as whole-wheat or whole-grain breads, crackers, cereals, and pasta. Unsweetened oatmeal. Bulgur. Barley. Quinoa. Brown rice. Corn or whole-wheat flour tortillas or taco shells. Vegetables Lettuce. Spinach. Peas. Beets. Cauliflower. Cabbage. Broccoli. Carrots. Tomatoes. Squash. Eggplant. Herbs. Peppers. Onions. Cucumbers. Brussels sprouts. Fruits Berries. Bananas. Apples. Oranges. Grapes. Papaya. Mango. Pomegranate. Kiwi. Grapefruit. Cherries. Meats and Other Protein Sources Seafood. Lean meats, such as chicken and [place] or lean cuts of pork and beef. Tofu. Eggs. Nuts. Beans. Dairy Low-fat or fat-free dairy products, such as yogurt, cottage cheese, and cheese. Beverages Water. Tea. Coffee. Sugar-free or diet soda. Seltzer water. Milk. Milk alternatives, such as soy or almond milk. Condiments Mustard. Relish. Low-fat, low-sugar ketchup. Low-fat, low-sugar barbecue sauce. Low-fat or fat-free mayonnaise. Sweets and Desserts Sugar-free or low-fat pudding. Sugar-free or low-fat ice cream and other frozen treats. Fats and Oils Avocado. Walnuts. Olive oil. The items listed above may not be a complete list of recommended foods or beverages. Contact your dietitian for more options. What foods are not recommended? Grains Refined white flour and flour products, such as bread, pasta, snack foods, and cereals. Beverages Sweetened drinks, such as sweet iced tea and soda. Sweets and Desserts Baked goods, such as cake, cupcakes, pastries, cookies, and cheesecake. The items listed above may not be a complete list of foods and beverages to avoid. Contact your dietitian for more information. This information is not intended  to replace advice given to you by your health care provider. Make sure you discuss any questions you have with your health care provider. Document Released: 04/25/2015 Document Revised: 05/16/2016 Document Reviewed: 01/04/2015 Elsevier  Interactive Patient Education  2017 Elsevier Inc.   Preventing Type 2 Diabetes Mellitus Type 2 diabetes (type 2 diabetes mellitus) is a long-term (chronic) disease that affects blood sugar (glucose) levels. Normally, a hormone called insulin allows glucose to enter cells in the body. The cells use glucose for energy. In type 2 diabetes, one or both of these problems may be present:  The body does not make enough insulin.  The body does not respond properly to insulin that it makes (insulin resistance).  Insulin resistance or lack of insulin causes excess glucose to build up in the blood instead of going into cells. As a result, high blood glucose (hyperglycemia) develops, which can cause many complications. Being overweight or obese and having an inactive (sedentary) lifestyle can increase your risk for diabetes. Type 2 diabetes can be delayed or prevented by making certain nutrition and lifestyle changes. What nutrition changes can be made?  Eat healthy meals and snacks regularly. Keep a healthy snack with you for when you get hungry between meals, such as fruit or a handful of nuts.  Eat lean meats and proteins that are low in saturated fats, such as chicken, fish, egg whites, and beans. Avoid processed meats.  Eat plenty of fruits and vegetables and plenty of grains that have not been processed (whole grains). It is recommended that you eat: ? 1?2 cups of fruit every day. ? 2?3 cups of vegetables every day. ? 6?8 oz of whole grains every day, such as oats, whole wheat, bulgur, brown rice, quinoa, and millet.  Eat low-fat dairy products, such as milk, yogurt, and cheese.  Eat foods that contain healthy fats, such as nuts, avocado, olive oil, and canola oil.  Drink water throughout the day. Avoid drinks that contain added sugar, such as soda or sweet tea.  Follow instructions from your health care provider about specific eating or drinking restrictions.  Control how much food you  eat at a time (portion size). ? Check food labels to find out the serving sizes of foods. ? Use a kitchen scale to weigh amounts of foods.  Saute or steam food instead of frying it. Cook with water or broth instead of oils or butter.  Limit your intake of: ? Salt (sodium). Have no more than 1 tsp (2,400 mg) of sodium a day. If you have heart disease or high blood pressure, have less than ? tsp (1,500 mg) of sodium a day. ? Saturated fat. This is fat that is solid at room temperature, such as butter or fat on meat. What lifestyle changes can be made?  Activity  Do moderate-intensity physical activity for at least 30 minutes on at least 5 days of the week, or as much as told by your health care provider.  Ask your health care provider what activities are safe for you. A mix of physical activities may be best, such as walking, swimming, cycling, and strength training.  Try to add physical activity into your day. For example: ? Park in spots that are farther away than usual, so that you walk more. For example, park in a far corner of the parking lot when you go to the office or the grocery store. ? Take a walk during your lunch break. ? Use stairs instead of elevators or escalators. Weight  Loss  Lose weight as directed. Your health care provider can determine how much weight loss is best for you and can help you lose weight safely.  If you are overweight or obese, you may be instructed to lose at least 5?7 % of your body weight. Alcohol and Tobacco   Limit alcohol intake to no more than 1 drink a day for nonpregnant women and 2 drinks a day for men. One drink equals 12 oz of beer, 5 oz of wine, or 1 oz of hard liquor.  Do not use any tobacco products, such as cigarettes, chewing tobacco, and e-cigarettes. If you need help quitting, ask your health care provider. Work With [place] Provider  Have your blood glucose tested regularly, as told by your health care  provider.  Discuss your risk factors and how you can reduce your risk for diabetes.  Get screening tests as told by your health care provider. You may have screening tests regularly, especially if you have certain risk factors for type 2 diabetes.  Make an appointment with a diet and nutrition specialist (registered dietitian). A registered dietitian can help you make a healthy eating plan and can help you understand portion sizes and food labels. Why are these changes important?  It is possible to prevent or delay type 2 diabetes and related health problems by making lifestyle and nutrition changes.  It can be difficult to recognize signs of type 2 diabetes. The best way to avoid possible damage to your body is to take actions to prevent the disease before you develop symptoms. What can happen if changes are not made?  Your blood glucose levels may keep increasing. Having high blood glucose for a long time is dangerous. Too much glucose in your blood can damage your blood vessels, heart, kidneys, nerves, and eyes.  You may develop prediabetes or type 2 diabetes. Type 2 diabetes can lead to many chronic health problems and complications, such as: ? Heart disease. ? Stroke. ? Blindness. ? Kidney disease. ? Depression. ? Poor circulation in the feet and legs, which could lead to surgical removal (amputation) in severe cases. Where to find support:  Ask your health care provider to recommend a registered dietitian, diabetes educator, or weight loss program.  Look for local or online weight loss groups.  Join a gym, fitness club, or outdoor activity group, such as a walking club. Where to find more information: To learn more about diabetes and diabetes prevention, visit:  American Diabetes Association (ADA): www.diabetes.[employer] of Diabetes and Digestive and Kidney Diseases: FindSpin.nl  To learn more about healthy eating,  visit:  The U.S. Department of Agriculture Scientist, research (physical sciences)), Choose My Plate: http://wiley-williams.com/  Office of Disease Prevention and Health Promotion (ODPHP), Dietary Guidelines: SurferLive.at  Summary  You can reduce your risk for type 2 diabetes by increasing your physical activity, eating healthy foods, and losing weight as directed.  Talk with your health care provider about your risk for type 2 diabetes. Ask about any blood tests or screening tests that you need to have. This information is not intended to replace advice given to you by your health care provider. Make sure you discuss any questions you have with your health care provider. Document Released: 04/01/2016 Document Revised: 05/16/2016 Document Reviewed: 01/30/2016 Elsevier Interactive Patient Education  [employer].

## 2018-10-29 NOTE — Progress Notes (Signed)
   Subjective:    Patient ID: Tony Mcmillan, male    DOB: Feb 13, 1941, 77 y.o.   MRN: [id number]  HPI Chief Complaint  Patient presents with  . discuss prediabetes    discuss prediabetes. knot on arm- doesn't hurt   He is here to discuss new diagnosis of prediabetes. States he went to a screening exam but cannot recall the location and he was told he has prediabetes. States he bought a meter but has not been checking his blood sugars.  Family history of diabetes.  States he feels fine.  Denies fever, chills, dizziness, abdominal pain, N/V/D, urinary symptoms.   Reviewed allergies, medications, past medical, surgical, family, and social history.  Review of Systems Pertinent positives and negatives in the history of present illness.     Objective:   Physical Exam BP 130/80   Pulse 83   Wt 151 lb (68.5 kg)   BMI 28.53 kg/m   Alert and oriented and in no acute distress. Not examined otherwise.       Assessment & Plan:  Prediabetes - Plan: HgB A1c  Hgb A1c 6.4%  Counseling done on new diagnosis of prediabetes and the whole diabetes spectrum. Explained what the Hgb A1c test means and how to prevent worsening health.  Advised to cut back on sugary foods, carbohydrates and increase his physical activity.  He may check his blood sugars but not necessary. Counseled on how to do this and goal ranges.  Follow up in 4 months.

## 2018-11-11 NOTE — Progress Notes (Signed)
 Office Visit Note  Patient: Tony Mcmillan             Date of Birth: 07/11/1941           MRN: [id number]             PCP: Henson, Vickie L, NP-C Referring: Henson, Vickie L, NP-C Visit Date: 11/25/2018 Occupation: @GUAROCC@  Subjective:  Bilateral wrist pain    History of Present Illness: Tony Mcmillan is a 77 y.o. male  with history of seronegative rheumatoid arthritis and DDD.  He is on Xeljanz 5 mg BID and MTX 0.3 ml once weekly.  He denies missing any doses of his medications recently.  He reports that he continues to have pain and swelling in bilateral wrist joints.  He states that left wrist pain is more severe.  He states he is also developed some numbness in bilateral hands.  He reports that his muscle tenderness in bilateral shoulders has improved.  He continues to have neck and lower back stiffness but has no discomfort at this time.  He denies any knee pain or feet pain at this time.  He denies any other joint swelling.  Patient present for the past 1 week he has noticed hoarseness and mild sore throat.  He denies any congestion or drainage.  He denies any fevers.  He denies any dysphasia or choking.  He states he has a cough intermittently but denies any shortness of breath.  He reports that he has had the influenza vaccination.  He has not followed up with his PCP yet.   Activities of Daily Living:  Patient reports morning stiffness for 15 minutes.   Patient Denies nocturnal pain.  Difficulty dressing/grooming: Denies Difficulty climbing stairs: Denies Difficulty getting out of chair: Reports Difficulty using hands for taps, buttons, cutlery, and/or writing: Reports  Review of Systems  Constitutional: Positive for fatigue. Negative for night sweats.  HENT: Positive for sore throat. Negative for mouth sores, trouble swallowing, trouble swallowing, mouth dryness and nose dryness.   Eyes: Negative for pain, redness, itching and dryness.  Respiratory: Negative for cough,  hemoptysis, shortness of breath, wheezing and difficulty breathing.   Cardiovascular: Positive for chest pain. Negative for palpitations, hypertension, irregular heartbeat and swelling in legs/feet.  Gastrointestinal: Negative for abdominal pain, blood in stool, constipation and diarrhea.  Endocrine: Negative for increased urination.  Genitourinary: Negative for painful urination, pelvic pain and urgency.  Musculoskeletal: Positive for arthralgias, joint pain, joint swelling and morning stiffness. Negative for myalgias, muscle weakness, muscle tenderness and myalgias.  Skin: Negative for color change, rash, hair loss, nodules/bumps, skin tightness, ulcers and sensitivity to sunlight.  Allergic/Immunologic: Negative for susceptible to infections.  Neurological: Negative for fainting, memory loss, night sweats and weakness.  Hematological: Negative for bruising/bleeding tendency and swollen glands.  Psychiatric/Behavioral: Negative for depressed mood, confusion and sleep disturbance. The patient is not nervous/anxious.     PMFS History:  Patient Active Problem List   Diagnosis Date Noted  . Prediabetes 10/29/2018  . History of systemic steroid therapy 10/30/2017  . Feeling of chest tightness, unclear cause, ? due to RA 02/21/2017  . Pulmonary emphysema (HCC) 12/18/2016  . Dyspnea and respiratory abnormality 11/25/2016  . 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  . High risk medication use 11/04/2016  . 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 Vitamin D deficiency 11/04/2016  . 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 History of anemia 07/02/2016  . History of colonic polyps 07/02/2016  .   History of gastric ulcer 07/02/2016  . Asymptomatic microscopic hematuria 04/03/2016  . Benign prostatic hyperplasia 04/03/2016  . Renal cyst, left 04/03/2016  . 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 DDD (degenerative disc disease), lumbar 04/03/2016  . Colonic  polyp 02/23/2016  . Duodenal ulcer 02/23/2016    Past Medical History:  Diagnosis Date  . Anemia   . Atypical chest pain    a. 09/2016 MV: EF 63%, normal perfusion.  . Blood transfusion without reported diagnosis   . Diastolic dysfunction    a. 2015 Echo: EF >55%;  b. 09/2016 Echo: EF 55-60%, no rwma, Gr1 DD, triv AI/MR, mild TR, PASP 39mHg.  . Emphysema of lung ([facility] 02/08/2016  . GERD (gastroesophageal reflux disease)   . History of systemic steroid therapy 10/30/2017  . [place]oid arthritis [place]     Family History  Problem Relation Age of Onset  . Hypertension Mother   . Hypertension Father   . Diabetes Father   . Hyperlipidemia Sister   . Diabetes Sister   . Stroke Brother   . Diabetes Brother   . Colon polyps Neg Hx   . Esophageal cancer Neg Hx   . Stomach cancer Neg Hx   . Rectal cancer Neg Hx   . Colon cancer Neg Hx    Past Surgical History:  Procedure Laterality Date  . ANTERIOR CERVICAL CORPECTOMY    . ANTERIOR CERVICAL CORPECTOMY  12[place]2014   [place]     both eyes  . COLOSTOMY REVERSAL     had infection on buttocks and had to have a skin graft- had colostomy to help area stay clean and heal  . HERNIA REPAIR    . SPINE SURGERY     Social History   Social History Narrative   Retired. Lives alone.     Objective: Vital Signs: BP 136/75 (BP Location: Right Arm, Patient Position: Sitting, Cuff Size: Normal)   Pulse 66   Resp 13   Ht 5' 1" (1.549 m)   Wt 151 lb (68.5 kg)   BMI 28.53 kg/m    Physical Exam  Constitutional: He is oriented to person, place, and time. He appears well-developed and well-nourished.  HENT:  Head: Normocephalic and atraumatic.  Eyes: Pupils are equal, round, and reactive to light. Conjunctivae and EOM are normal.  Neck: Normal range of motion. Neck supple.  Cardiovascular: Normal rate, regular rhythm and normal heart sounds.  Pulmonary/Chest: Effort normal and breath sounds normal.    Abdominal: Soft. Bowel sounds are normal.  Lymphadenopathy:    He has no cervical adenopathy.  Neurological: He is alert and oriented to person, place, and time.  Skin: Skin is warm and dry. Capillary refill takes less than 2 seconds.  Psychiatric: He has a normal mood and affect. His behavior is normal.  Nursing note and vitals reviewed.    Musculoskeletal Exam: C-spine limited range of motion with discomfort.  Mild thoracic kyphosis. lumbar spine limited range of motion.  No midline spinal tenderness.  No SI joint tenderness.  Shoulder joints limited abduction to 120 degrees bilaterally.  No tenderness at the deltoid insertion site bilaterally.  Elbow joints full ROM with no tenderness or swelling.  Extensor tenosynovitis of bilateral wrists worse than the left.  MCPs, PIPs, DIPs good range of motion no synovitis.  He has complete fist formation bilaterally.  Hip joints, knee joints and ankle joints and MTPs and PIPs and DIPs good range of motion  no synovitis.  No warmth or effusion bilateral knee joints.  No tenderness or swelling of ankle joints.  No tenderness of trochanteric bursa bilaterally.   CDAI Exam: CDAI Score: 5.4  Patient Global Assessment: 8 (mm); Provider Global Assessment: 6 (mm) Swollen: 2 ; Tender: 2  Joint Exam      Right  Left  Wrist  Swollen Tender  Swollen Tender     Investigation: No additional findings.  Imaging: No results found.  Recent Labs: Lab Results  Component Value Date   WBC 4.9 10/13/2018   HGB 14.1 10/13/2018   PLT 254 10/13/2018   NA 138 10/13/2018   K 4.5 10/13/2018   CL 103 10/13/2018   CO2 26 10/13/2018   GLUCOSE 106 (H) 10/13/2018   BUN 16 10/13/2018   CREATININE 1.31 (H) 10/13/2018   BILITOT 0.9 10/13/2018   ALKPHOS 69 08/03/2018   AST 29 10/13/2018   ALT 38 10/13/2018   PROT 6.9 10/13/2018   ALBUMIN 4.4 08/03/2018   CALCIUM 9.1 10/13/2018   GFRAA 61 10/13/2018   QFTBGOLDPLUS NEGATIVE 08/31/2018    Speciality Comments:  No specialty comments available.  Procedures:  No procedures performed Allergies: Patient has no known allergies.    Assessment / Plan:     Visit Diagnoses: Rheumatoid arthritis of multiple sites with negative rheumatoid factor (HCC): He continues to have extensor tenosynovitis of both wrists, left wrist worse than right. He had a left intercarpal cortisone injection performed on 08/26/18 that provided significant pain relief.  He continues to take Xeljanz 5 mg 1 tablet BID, MTX 0.3 ml once weekly, and folic acid 1 mg po daily.  He has not missed any doses recently.  He has no other active synovitis on exam.  No nodulosis noted.  His rheumatoid arthritis is not well controlled on his current treatment regimen.  We are unable to increase his dose of methotrexate due to elevated creatinine and low GFR.  We discussed with the patient switching from Xeljanz to Rinvoq.  Indications, contraindications, and potential side effects were discussed.  Consent was obtained today.  We will apply for Rinvoq through his insurance.  A prednisone taper starting at 20 mg and tapering by 5 mg every 4 days was sent to the pharmacy today.  Counseled patient that Rinvoq is a JAK inhibitor indicated for Rheumatoid Arthritis.  Counseled patient on purpose, proper use, and adverse effects of Rinvoq.    Reviewed the most common adverse effects including infection, diarrhea, headaches.  Also reviewed rare adverse effects such as bowel injury and the need to contact us if they develop stomach pain during treatment. Counseled on the increase risk of venous thrombosis. Reviewed with patient that there is the possibility of an increased risk of malignancy but it is not well understood if this increased risk is due to the medication or the disease state.  Counseled patient to avoid live vaccines while on Rinvoq.  Advised patient to get annual influenza vaccine, pneumococcal vaccine, hepatitis B and Shingrix as indicated.    Provided  patient with medication education material and answered all questions.  Patient consented to Rinvoq.  Will upload into patient's chart.    Patient dose will be 15 mg po daily.  Prescription will be sent to pharmacy pending lab results and insurance approval.  High risk medication use -  Xeljanz 5 mg twice daily and methotrexate 0.3 mL once weekly.  Last TB gold negative on 08/31/2018.  Last lipid panel within normal limits on 08/03/2018.    He is currently taking Lipitor 10 mg managed by his PCP.  Most recent CBC/CMP stable on 10/13/2018.  Next CBC/CMP due in January and then every 3 months to monitor for drug toxicity.  Standing orders placed. X-ray of bilateral hands and feet taken on 10/15/2017.  He has had the seasonal influenza vaccine.   Elevated CK - 262 on 10/13/18: At his last office visit patient complained of myalgias and CK was elevated at 262.  ESR normal and anti--HMG CR antibody was negative.  He is currently on Lipitor 10 mg by mouth daily.  His muscle aches and tenderness have improved significantly.  He no longer has tenderness at the deltoid insertion site.  He has no muscle weakness on exam.  We will recheck Ck today.  Plan: CK  DDD (degenerative disc disease), cervical:He has limited ROM with discomfort.  He has no symptoms of radiculopathy at this time.   DDD (degenerative disc disease), lumbar: He has limited ROM with discomfort.  No radiculopathy or sciatica at this time.    History of vitamin D deficiency: He is taking a calcium and vitamin D supplement daily.   Other medical conditions are listed as follows:   Asymptomatic microscopic hematuria  Dyslipidemia  History of BPH  History of COPD  History of colonic polyps  History of gastric ulcer   Orders: Orders Placed This Encounter  Procedures  . CK   Meds ordered this encounter  Medications  . predniSONE (DELTASONE) 5 MG tablet    Sig: Take 4 tablets by mouth daily x4 days, 3 tablets by mouth daily x4 days, 2  tablets by mouth daily x4 days, 1 tablets by mouth x4 days.    Dispense:  40 tablet    Refill:  0    Face-to-face time spent with patient was 30 minutes. Greater than 50% of time was spent in counseling and coordination of care.  Follow-Up Instructions: Return in about 5 months (around 04/26/2019) for Rheumatoid arthritis, DDD.   Ofilia Neas, PA-C  I examined and evaluated the patient with [name] PA. The plan of care was discussed as noted above.  Bo Merino, MD  Note - This record has been created using Editor, commissioning.  Chart creation errors have been sought, but may not always  have been located. Such creation errors do not reflect on  the standard of medical care.

## 2018-11-18 ENCOUNTER — Other Ambulatory Visit: Payer: Self-pay | Admitting: Rheumatology

## 2018-11-18 NOTE — Telephone Encounter (Signed)
Last Visit: 10/09/18 Next Visit: 11/25/18  Okay to refill per Dr. Estanislado Pandy

## 2018-11-25 ENCOUNTER — Telehealth: Payer: Self-pay | Admitting: Pharmacist

## 2018-11-25 ENCOUNTER — Telehealth: Payer: Self-pay | Admitting: Pharmacy Technician

## 2018-11-25 ENCOUNTER — Ambulatory Visit (INDEPENDENT_AMBULATORY_CARE_PROVIDER_SITE_OTHER): Payer: Medicare Other | Admitting: Physician Assistant

## 2018-11-25 ENCOUNTER — Encounter: Payer: Self-pay | Admitting: Physician Assistant

## 2018-11-25 VITALS — BP 136/75 | HR 66 | Resp 13 | Ht 61.0 in | Wt 151.0 lb

## 2018-11-25 DIAGNOSIS — Z8639 Personal history of other endocrine, nutritional and metabolic disease: Secondary | ICD-10-CM

## 2018-11-25 DIAGNOSIS — Z8709 Personal history of other diseases of the respiratory system: Secondary | ICD-10-CM | POA: Diagnosis not present

## 2018-11-25 DIAGNOSIS — R3121 Asymptomatic microscopic hematuria: Secondary | ICD-10-CM

## 2018-11-25 DIAGNOSIS — Z79899 Other long term (current) drug therapy: Secondary | ICD-10-CM

## 2018-11-25 DIAGNOSIS — M5030 Other cervical disc degeneration, unspecified cervical region: Secondary | ICD-10-CM | POA: Diagnosis not present

## 2018-11-25 DIAGNOSIS — Z8719 Personal history of other diseases of the digestive system: Secondary | ICD-10-CM | POA: Diagnosis not present

## 2018-11-25 DIAGNOSIS — E785 Hyperlipidemia, unspecified: Secondary | ICD-10-CM

## 2018-11-25 DIAGNOSIS — M5136 Other intervertebral disc degeneration, lumbar region: Secondary | ICD-10-CM | POA: Diagnosis not present

## 2018-11-25 DIAGNOSIS — M0609 Rheumatoid arthritis without rheumatoid factor, multiple sites: Secondary | ICD-10-CM | POA: Diagnosis not present

## 2018-11-25 DIAGNOSIS — Z86010 Personal history of colonic polyps: Secondary | ICD-10-CM

## 2018-11-25 DIAGNOSIS — Z8711 Personal history of peptic ulcer disease: Secondary | ICD-10-CM

## 2018-11-25 DIAGNOSIS — R748 Abnormal levels of other serum enzymes: Secondary | ICD-10-CM

## 2018-11-25 DIAGNOSIS — Z87438 Personal history of other diseases of male genital organs: Secondary | ICD-10-CM | POA: Diagnosis not present

## 2018-11-25 MED ORDER — PREDNISONE 5 MG PO TABS: ORAL_TABLET | ORAL | 0 refills | Status: DC

## 2018-11-25 NOTE — Telephone Encounter (Signed)
Please start benefit investigation for Rinvoq.  Patient is currently on Morrie Sheldon and has two month supply.  Thank you!

## 2018-11-25 NOTE — Telephone Encounter (Signed)
Received a Prior Authorization request from [facility] for Rinvoq. Authorization has been submitted to patient's insurance via Cover My Meds. Will update once we receive a response.  12:02 PM Beatriz Chancellor, CPhT

## 2018-11-25 NOTE — Patient Instructions (Signed)
Standing Labs We placed an order today for your standing lab work.    Please come back and get your standing labs in January and every 3 months   We have open lab Monday through Friday from 8:30-11:30 AM and 1:30-4:00 PM  at the office of Dr. Shaili Deveshwar.   You may experience shorter wait times on Monday and Friday afternoons. The office is located at [address], [address], [place], [place] [address] No appointment is necessary.   Labs are drawn by [facility].  You may receive a bill from [facility] for your lab work. If you have any questions regarding directions or hours of operation,  please call [phone].   Just as a reminder please drink plenty of water prior to coming for your lab work. Thanks!   

## 2018-11-25 NOTE — Telephone Encounter (Signed)
Received notification from [employer] regarding a prior authorization for Rinvoq. Authorization has been APPROVED from 10/26/2018 to 02/23/2019.   Will send document to scan center, once received.  Authorization #[id number]  Per plan, patient must use [facility].   12:04 PM Beatriz Chancellor, CPhT

## 2018-11-25 NOTE — Progress Notes (Signed)
Pharmacy Note  Subjective: Patient presents today to the [facility] to see Dr. Estanislado Pandy.  Patient seen by the pharmacist for counseling on Rinvoq for for rheumatoid arthritis.  He is currently on Xeljanz 5 mg twice daily and methotrexate 0.3 mL's weekly without adequate response. Methotrexate dose can not be increased due to increased serum creatinine. Previous medications include: Enbrel (inadequate response).  Objective:  Baseline lab work Quantiferon TB [employer] Latest Ref Rng & Units 08/31/2018  Quantiferon TB Gold Plus NEGATIVE NEGATIVE   Serum Protein Electrophoresis Latest Ref Rng & Units 10/13/2018  Total Protein 6.1 - 8.1 g/dL 6.9  Immunoglobulins: normal  08/14/16 Hepatitis panel: negative (08/14/16) HIV: negative (08/14/16)  CMP     Component Value Date/Time   NA 138 10/13/2018 1335   NA 137 08/03/2018 1509   K 4.5 10/13/2018 1335   CL 103 10/13/2018 1335   CO2 26 10/13/2018 1335   GLUCOSE 106 (H) 10/13/2018 1335   BUN 16 10/13/2018 1335   BUN 11 08/03/2018 1509   CREATININE 1.31 (H) 10/13/2018 1335   CALCIUM 9.1 10/13/2018 1335   PROT 6.9 10/13/2018 1335   PROT 7.6 08/03/2018 1509   ALBUMIN 4.4 08/03/2018 1509   AST 29 10/13/2018 1335   ALT 38 10/13/2018 1335   ALKPHOS 69 08/03/2018 1509    CBC    Component Value Date/Time   WBC 4.9 10/13/2018 1335   RBC 4.87 10/13/2018 1335   HGB 14.1 10/13/2018 1335   HGB 14.6 08/03/2018 1509   HCT 41.0 10/13/2018 1335   HCT 43.1 08/03/2018 1509   PLT 254 10/13/2018 1335   PLT 288 08/03/2018 1509   MCV 84.2 10/13/2018 1335   MCV 86 08/03/2018 1509   MCH 29.0 10/13/2018 1335   MCHC 34.4 10/13/2018 1335   RDW 16.2 (H) 10/13/2018 1335   RDW 18.0 (H) 08/03/2018 1509    Does patient have history of diverticulitis?  No  Assessment/Plan:  Counseled patient that Rinvoq is a JAK inhibitor indicated for Rheumatoid Arthritis.  Counseled patient on purpose, proper use, and adverse effects of Rinvoq.    Reviewed  the most common adverse effects including infection, diarrhea, headaches.  Also reviewed rare adverse effects such as bowel injury and the need to contact us if they develop stomach pain during treatment. Counseled on the increase risk of venous thrombosis. Reviewed with patient that there is the possibility of an increased risk of malignancy but it is not well understood if this increased risk is due to the medication or the disease state.  Counseled patient to avoid live vaccines while on Rinvoq.  Advised patient to get annual influenza vaccine, pneumococcal vaccine, hepatitis B and Shingrix as indicated.    Provided patient with medication education material and answered all questions.  Patient consented to Rinvoq.  Will upload into patient's chart.    Patient recently received 2 months worth of medication.    Prescription will be sent to pharmacy pending insurance approval. Will update patient when we receive a response.  All question encouraged and answered.  Mariella Saa, PharmD, [facility] Rheumatology Clinical Pharmacist  11/25/2018 11:41 AM

## 2018-11-26 ENCOUNTER — Other Ambulatory Visit: Payer: Self-pay | Admitting: Rheumatology

## 2018-11-26 ENCOUNTER — Ambulatory Visit (INDEPENDENT_AMBULATORY_CARE_PROVIDER_SITE_OTHER): Payer: Medicare Other | Admitting: Family Medicine

## 2018-11-26 ENCOUNTER — Encounter: Payer: Self-pay | Admitting: Family Medicine

## 2018-11-26 VITALS — BP 120/70 | HR 78 | Temp 97.8°F | Resp 16 | Wt 150.8 lb

## 2018-11-26 DIAGNOSIS — R0982 Postnasal drip: Secondary | ICD-10-CM | POA: Diagnosis not present

## 2018-11-26 DIAGNOSIS — R0789 Other chest pain: Secondary | ICD-10-CM | POA: Diagnosis not present

## 2018-11-26 DIAGNOSIS — R490 Dysphonia: Secondary | ICD-10-CM

## 2018-11-26 NOTE — Progress Notes (Signed)
Please add Myositis panel.

## 2018-11-26 NOTE — Progress Notes (Signed)
Subjective:  Tony Mcmillan is a 77 y.o. male who presents for a one week history of sore throat, hoarseness, post nasal drainage and cough. Cough is not any worse than his usual. States the right side of his chest has been hurting over the past week. States this feels like his usual chronic chest pain. Pain is intermittent, seems worse at times with movement. No pain today. Denies palpitations, worsening shortness of breath, abdominal pain, N/V/D.  He is taking methotrexate and Xeljanz for RA.   Denies fever, chills, headache, dizziness, ear pain, rhinorrhea, nasal congestion, sinus pain,  He was seen at his rheumatologist yesterday and prescribed a course of oral prednisone. Started prednisone yesterday and states his pain has improved since starting on this.   ROS as in subjective.   Objective: Vitals:   11/26/18 0956  BP: 120/70  Pulse: 78  Resp: 16  Temp: 97.8 F (36.6 C)  SpO2: 98%    General appearance: Alert, WD/WN, no distress, mildly ill appearing                             Skin: warm, no rash                           Head: no sinus tenderness                            Eyes: conjunctiva normal, corneas clear, PERRLA                            Ears: pearly TMs, external ear canals normal                          Nose: septum midline, turbinates swollen, with erythema and no discharge             Mouth/throat: MMM, tongue normal, mild pharyngeal erythema, no edema or exudate                           Neck: supple, no adenopathy, no thyromegaly, nontender                          Heart: RRR, normal S1, S2, no murmurs                         Lungs: CTA bilaterally, no wheezes, rales, or rhonchi. Normal work of breathing.    Extremities: no edema, pulses intact, soft and symmetrical non tender calves. Negative Homan's.        Assessment: Right-sided chest wall pain  Post-nasal drainage  Hoarseness    Plan: Discussed that his intermittent right sided chest pain is  unchanged from chronic chest pain and no red flag symptoms. Pain is not present with exertion. No sign of DVT or PE. No obvious infection.  Reports pain has improved since yesterday after starting on oral steroids prescribed by his rheumatolgist.   Suggested symptomatic OTC remedies. Salt water gargles, throat lozenges.  We will give this more time and see if his symptoms completely resolve on steroids.  Call/return if worsening and follow up next week.

## 2018-11-26 NOTE — Patient Instructions (Signed)
Continue the prednisone. Let's see how your symptoms are progressing over the next few days.  You can try some salt water gargles and throat lozenges.  If you develop fever, worsening cough or pain that does not come and go with movement then you should be seen again.  There are  no obvious signs of infection or worrisome symptoms.   Return to see me next week.

## 2018-11-26 NOTE — Telephone Encounter (Signed)
Notified patient of approval.  He would like to finish the rest of his Morrie Sheldon prescription prior to starting Rinvoq.  Patient is to call when he is ready to start Rinvoq and we will send in prescription.  Must use [facility] per patient insurance.

## 2018-11-26 NOTE — Progress Notes (Signed)
CK is elevated at 301. Discussed lab value with Dr. Estanislado Pandy. Please add myositis.

## 2018-11-27 NOTE — Telephone Encounter (Signed)
Last Visit: 11/25/18 Next Visit: 04/26/19 Labs: 10/13/18 CBC stable. Glucose is 106. Creatinine and GFR stable.  Okay to refill per Dr. Estanislado Pandy

## 2018-11-30 ENCOUNTER — Telehealth: Payer: Self-pay | Admitting: Rheumatology

## 2018-11-30 NOTE — Telephone Encounter (Signed)
Patient advised that his CK is elevated. Patient advised that we have added an additional test to his blood work, the myositis panel. Patient advised to continue his current regimen until otherwise notified. Patient verbalized understanding.

## 2018-11-30 NOTE — Telephone Encounter (Signed)
Patient called requesting a return call with his labwork results.

## 2018-12-02 ENCOUNTER — Ambulatory Visit (INDEPENDENT_AMBULATORY_CARE_PROVIDER_SITE_OTHER): Payer: Medicare Other | Admitting: Family Medicine

## 2018-12-02 ENCOUNTER — Ambulatory Visit
Admission: RE | Admit: 2018-12-02 | Discharge: 2018-12-02 | Disposition: A | Discharge status: Home / Self Care | Payer: Medicare Other | Source: Ambulatory Visit | Attending: Family Medicine | Admitting: Family Medicine

## 2018-12-02 ENCOUNTER — Encounter: Payer: Self-pay | Admitting: Family Medicine

## 2018-12-02 VITALS — BP 120/72 | HR 85 | Temp 97.7°F | Resp 16 | Wt 150.8 lb

## 2018-12-02 DIAGNOSIS — R0789 Other chest pain: Secondary | ICD-10-CM | POA: Diagnosis not present

## 2018-12-02 DIAGNOSIS — J439 Emphysema, unspecified: Secondary | ICD-10-CM | POA: Diagnosis not present

## 2018-12-02 DIAGNOSIS — R0989 Other specified symptoms and signs involving the circulatory and respiratory systems: Secondary | ICD-10-CM

## 2018-12-02 DIAGNOSIS — Z79899 Other long term (current) drug therapy: Secondary | ICD-10-CM | POA: Diagnosis not present

## 2018-12-02 DIAGNOSIS — R0689 Other abnormalities of breathing: Secondary | ICD-10-CM

## 2018-12-02 LAB — CBC WITH DIFFERENTIAL/PLATELET
Basophils Absolute: 0 10*3/uL (ref 0.0–0.2)
Basos: 0 %
EOS (ABSOLUTE): 0 10*3/uL (ref 0.0–0.4)
Eos: 0 %
Hematocrit: 44.1 % (ref 37.5–51.0)
Hemoglobin: 14.9 g/dL (ref 13.0–17.7)
Immature Grans (Abs): 0.1 10*3/uL (ref 0.0–0.1)
Immature Granulocytes: 1 %
Lymphocytes Absolute: 0.6 10*3/uL — ABNORMAL LOW (ref 0.7–3.1)
Lymphs: 6 %
MCH: 29.2 pg (ref 26.6–33.0)
MCHC: 33.8 g/dL (ref 31.5–35.7)
MCV: 86 fL (ref 79–97)
Monocytes Absolute: 0.5 10*3/uL (ref 0.1–0.9)
Monocytes: 5 %
Neutrophils Absolute: 9 10*3/uL — ABNORMAL HIGH (ref 1.4–7.0)
Neutrophils: 88 %
Platelets: 374 10*3/uL (ref 150–450)
RBC: 5.11 x10E6/uL (ref 4.14–5.80)
RDW: 17.5 % — ABNORMAL HIGH (ref 12.3–15.4)
WBC: 10.2 10*3/uL (ref 3.4–10.8)

## 2018-12-02 NOTE — Progress Notes (Signed)
   Subjective:    Patient ID: Tony Mcmillan, male    DOB: [birth year], 77 y.o.   MRN: [id number]  HPI Chief Complaint  Patient presents with  . 1 week follow-up    week follow-up on chest. feels worse. tightness.    He is here to follow up on  right sided chest wall pain. Pain is worse per patient. Overall he has had this particular pain since April 2019 if not earlier.   States pain is non radiating and reports a "tightness" in his right chest and states he feels like his lung is "inflamed". Pain dull when sitting still but worsens with deep inspiration or certain movements.  States he is still taking the prednisone tapering dose pak prescribed by his rheumatologist. He is also taking methotrexate and Xeljanz for RA.  Denies fever, chills, palpitations. Denies coughing more than usual. No worsening shortness of breath. Denies abdominal pian, N/V/D.   History of emphysema. He has been evaluated by Dr. Chase Caller for the same right sided chest pain in May 2019.   Reviewed allergies, medications, past medical, surgical, family, and social history.   Review of Systems Pertinent positives and negatives in the history of present illness.     Objective:   Physical Exam  Constitutional: He is oriented to person, place, and time. He appears well-developed and well-nourished. No distress.  HENT:  Mouth/Throat: Oropharynx is clear and moist.  Eyes: Pupils are equal, round, and reactive to light. Conjunctivae are normal.  Neck: Neck supple. No JVD present.  Cardiovascular: Normal rate, regular rhythm, normal heart sounds and intact distal pulses. Exam reveals no gallop and no friction rub.  No murmur heard. Pulmonary/Chest: Effort normal. No accessory muscle usage. He has decreased breath sounds in the right lower field.  Dullness on percussion to RLL   Neurological: He is alert and oriented to person, place, and time.  Skin: Skin is warm and dry. Capillary refill takes less than 2 seconds.    Psychiatric: He has a normal mood and affect. His speech is normal.   BP 120/72   Pulse 85   Temp 97.7 F (36.5 C) (Oral)   Resp 16   Wt 150 lb 12.8 oz (68.4 kg)   SpO2 95%   BMI 28.49 kg/m        Assessment & Plan:  Right-sided chest wall pain - Plan: CBC with Differential/Platelet, DG Chest 2 View, CANCELED: DG Chest 2 View  Decreased breath sounds at right lung base - Plan: CBC with Differential/Platelet, DG Chest 2 View, CANCELED: DG Chest 2 View  Pulmonary emphysema, unspecified emphysema type (HCC)  High risk medication use  Feeling of chest tightness, unclear cause, ? due to RA - Plan: DG Chest 2 View  He is not in any acute distress and no sign of infection however he does have decreased lung sounds to his RLL along with dullness to percussion.  Unclear etiology for his pain and this has been ongoing for the past 8-9 months. He has also been evaluated by his pulmonologist and rheumatologist for this.  I question whether this could be related to his RA diagnosis and will have him follow up with Dr. Estanislado Pandy if CBC, chest XR are non contributory.

## 2018-12-04 ENCOUNTER — Encounter: Payer: Self-pay | Admitting: Family Medicine

## 2018-12-08 LAB — TEST AUTHORIZATION

## 2018-12-08 LAB — MYOSITIS ASSESSR PLUS JO-1 AUTOABS
EJ Autoabs: NOT DETECTED
Jo-1 Autoabs: <1 AI
Ku Autoabs: NOT DETECTED
Mi-2 Autoabs: NOT DETECTED
OJ Autoabs: NOT DETECTED
PL-12 Autoabs: NOT DETECTED
PL-7 Autoabs: NOT DETECTED
SRP Autoabs: NOT DETECTED

## 2018-12-08 LAB — CK: Total CK: 301 U/L — ABNORMAL HIGH (ref 44–196)

## 2018-12-08 NOTE — Progress Notes (Signed)
Myositis panel is negative.

## 2019-01-13 ENCOUNTER — Other Ambulatory Visit: Payer: Self-pay | Admitting: *Deleted

## 2019-01-13 DIAGNOSIS — Z79899 Other long term (current) drug therapy: Secondary | ICD-10-CM

## 2019-01-14 LAB — CBC WITH DIFFERENTIAL/PLATELET
Absolute Monocytes: 555 cells/uL (ref 200–950)
Basophils Absolute: 30 cells/uL (ref 0–200)
Basophils Relative: 0.7 %
Eosinophils Absolute: 39 cells/uL (ref 15–500)
Eosinophils Relative: 0.9 %
HCT: 37.4 % — ABNORMAL LOW (ref 38.5–50.0)
Hemoglobin: 12.7 g/dL — ABNORMAL LOW (ref 13.2–17.1)
Lymphs Abs: 555 cells/uL — ABNORMAL LOW (ref 850–3900)
MCH: 29.6 pg (ref 27.0–33.0)
MCHC: 34 g/dL (ref 32.0–36.0)
MCV: 87.2 fL (ref 80.0–100.0)
MPV: 9.9 fL (ref 7.5–12.5)
Monocytes Relative: 12.9 %
Neutro Abs: 3122 cells/uL (ref 1500–7800)
Neutrophils Relative %: 72.6 %
Platelets: 321 10*3/uL (ref 140–400)
RBC: 4.29 10*6/uL (ref 4.20–5.80)
RDW: 17 % — ABNORMAL HIGH (ref 11.0–15.0)
Total Lymphocyte: 12.9 %
WBC: 4.3 10*3/uL (ref 3.8–10.8)

## 2019-01-14 LAB — COMPLETE METABOLIC PANEL WITH GFR
AG Ratio: 1.3 (calc) (ref 1.0–2.5)
ALT: 29 U/L (ref 9–46)
AST: 28 U/L (ref 10–35)
Albumin: 3.9 g/dL (ref 3.6–5.1)
Alkaline phosphatase (APISO): 52 U/L (ref 40–115)
BUN/Creatinine Ratio: 10 (calc) (ref 6–22)
BUN: 14 mg/dL (ref 7–25)
CO2: 23 mmol/L (ref 20–32)
Calcium: 9.6 mg/dL (ref 8.6–10.3)
Chloride: 108 mmol/L (ref 98–110)
Creat: 1.34 mg/dL — ABNORMAL HIGH (ref 0.70–1.18)
GFR, Est African American: 59 mL/min/{1.73_m2} — ABNORMAL LOW (ref >=60)
GFR, Est Non African American: 51 mL/min/{1.73_m2} — ABNORMAL LOW (ref >=60)
Globulin: 2.9 g/dL (calc) (ref 1.9–3.7)
Glucose, Bld: 87 mg/dL (ref 65–99)
Potassium: 4.8 mmol/L (ref 3.5–5.3)
Sodium: 138 mmol/L (ref 135–146)
Total Bilirubin: 1 mg/dL (ref 0.2–1.2)
Total Protein: 6.8 g/dL (ref 6.1–8.1)

## 2019-01-14 NOTE — Progress Notes (Signed)
Labs are stable.

## 2019-01-26 ENCOUNTER — Telehealth: Payer: Self-pay | Admitting: Rheumatology

## 2019-01-26 NOTE — Telephone Encounter (Signed)
Spoke with patient and advised we left message on 01/14/19 to advise patient lab results are stable. Patient verbalilzed understanding.

## 2019-01-26 NOTE — Telephone Encounter (Signed)
Patient called requesting a return call with the results of his labwork from 01/13/19.

## 2019-01-29 ENCOUNTER — Ambulatory Visit (INDEPENDENT_AMBULATORY_CARE_PROVIDER_SITE_OTHER): Payer: Medicare Other | Admitting: Family Medicine

## 2019-01-29 ENCOUNTER — Encounter: Payer: Self-pay | Admitting: Family Medicine

## 2019-01-29 ENCOUNTER — Ambulatory Visit: Payer: Medicare Other | Admitting: Family Medicine

## 2019-01-29 VITALS — BP 130/80 | HR 96

## 2019-01-29 DIAGNOSIS — R0789 Other chest pain: Secondary | ICD-10-CM | POA: Diagnosis not present

## 2019-01-29 DIAGNOSIS — R0600 Dyspnea, unspecified: Secondary | ICD-10-CM

## 2019-01-29 DIAGNOSIS — R002 Palpitations: Secondary | ICD-10-CM | POA: Insufficient documentation

## 2019-01-29 DIAGNOSIS — K5900 Constipation, unspecified: Secondary | ICD-10-CM

## 2019-01-29 DIAGNOSIS — I2510 Atherosclerotic heart disease of native coronary artery without angina pectoris: Secondary | ICD-10-CM

## 2019-01-29 DIAGNOSIS — R0609 Other forms of dyspnea: Secondary | ICD-10-CM | POA: Diagnosis not present

## 2019-01-29 DIAGNOSIS — J439 Emphysema, unspecified: Secondary | ICD-10-CM

## 2019-01-29 HISTORY — DX: Palpitations: R00.2

## 2019-01-29 NOTE — Patient Instructions (Signed)
If your symptoms return over the weekend or if you develop severe chest pain or shortness of breath you should be evaluated in the emergency department. I do not expect this to happen.   You will receive a call from the cardiology office to schedule.   For your constipation- try using a cap full of Miralax in 8 ounces of water once daily for now. You can increase or decrease the amount depending on how much success you have with softening your stools.   Follow up with me if you are not improving.

## 2019-01-29 NOTE — Progress Notes (Signed)
   Subjective:    Patient ID: Tony Mcmillan, male    DOB: 05/15/1941, 78 y.o.   MRN: [id number]  HPI Chief Complaint  Patient presents with  . heart fluttering, chest tight, always some sob, no cardiolog   He is a 78 year old male with a history of pulmonary emphysema, RA on methotrexate and Xeljanz, CAD, prediabetes who is here with a 2 day history of intermittent palpitations that he describes "fluttering" in his chest. States this occurred earlier this week but resolved as of yesterday. He also reports having chest tightness that is his usual pain but not associated with the palpitations. Pain is non radiating and not worse with exertion.  Notes having worsening DOE. No exertional chest pain.  Reports feeling back to baseline today. No chest pain, palpitations or shortness of breath.   History of normal echo and stress test per patient. He has not seen his cardiologist in several years.  Dr. Chase Caller manages his pulmonary emphysema.  Also complains of hard stools for the past few weeks. No changes in his diet. Does not drink enough water. Having his normal daily bowel movement but stools are harder and not as much as usual. He is having to strain. No blood or pus.    Denies fever, chills, dizziness, abdominal pain, N/V/D, urinary symptoms, LE edema.   Reviewed allergies, medications, past medical, surgical, family, and social history.   Review of Systems Pertinent positives and negatives in the history of present illness.     Objective:   Physical Exam BP 130/80 (BP Location: Right Arm, Patient Position: Sitting, Cuff Size: Normal)   Pulse 96   SpO2 94%         Assessment & Plan:  Intermittent palpitations - Plan: Ambulatory referral to Cardiology, EKG 12-Lead  DOE (dyspnea on exertion) - Plan: Ambulatory referral to Cardiology, EKG 12-Lead  Coronary artery disease involving native heart without angina pectoris, unspecified vessel or lesion type - Plan: Ambulatory referral  to Cardiology, EKG 12-Lead  Sensation of chest tightness - Plan: Ambulatory referral to Cardiology, EKG 12-Lead  Pulmonary emphysema, unspecified emphysema type ([place]) - Plan: EKG 12-Lead  Constipation, unspecified constipation type  ECG with NSR and rate 70. Sinus arrhythmia No ST changes.  He is in no acute distress.  Not exertional chest tightness which is chronic in nature.  He had 2 days of intermittent palpitations and these have since resolved.  This is new per patient. He does have a history of CAD. He would like to follow-up with cardiology and I think this is reasonable. He has an appointment with cardiology later this month. Referral put in per advisement from cardiology office.  He will continue seeing his rheumatologist. Counseling on making sure he is drinking plenty of water and he may start using MiraLAX for constipation.  He will follow-up if this is not improving.

## 2019-02-08 ENCOUNTER — Encounter: Payer: Self-pay | Admitting: Physician Assistant

## 2019-02-08 DIAGNOSIS — R079 Chest pain, unspecified: Secondary | ICD-10-CM | POA: Insufficient documentation

## 2019-02-08 NOTE — Progress Notes (Signed)
Cardiology Office Note    Date:  02/09/2019   ID:  Tony Mcmillan, DOB 1941-02-22, MRN [id number]  PCP:  Girtha Rm, NP-C  Cardiologist: No primary care provider on file. EPS: None  Chief Complaint  Patient presents with  . Chest Pain  . Palpitations    History of Present Illness:  Tony Mcmillan is a 78 y.o. male who is being seen today for the evaluation of chest pain and palpitations at the request of Tony Mcmillan, Vickie L, NP-C.  Patient with history of atypical chest pain with normal NST 09/2016 echo at that time normal LV function with grade 1 DD.  Last saw Tony Bayley, NP to 02/2017 with atypical chest pain.  Patient also has emphysema, rheumatoid arthritis on methotrexate, GERD, previous tobacco abuse and brother with MI and stents.  A few weeks ago his heart felt like it was going fast, flutter, and had a sharp shooting pain. Lasted 2-3 hrs and then came back the next day. Happened again Saturday lasting an hour. No associated dyspnea, dizziness. Has constant chest tightness that never goes away-has had with emphysema for years. No worse with exertion.  No HTN, DM, quit smoking many years ago. Has HLD & family hx. 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  Past Medical History:  Diagnosis Date  . Anemia   . Atypical chest pain    a. 09/2016 MV: EF 63%, normal perfusion.  . Blood transfusion without reported diagnosis   . Diastolic dysfunction    a. 2015 Echo: EF >55%;  b. 09/2016 Echo: EF 55-60%, no rwma, Gr1 DD, triv AI/MR, mild TR, PASP 25mmHg.  . Emphysema of lung ([place]) 02/08/2016  . GERD (gastroesophageal reflux disease)   . History of systemic steroid therapy 10/30/2017  . Rheumatoid arthritis [facility])     Past Surgical History:  Procedure Laterality Date  . ANTERIOR CERVICAL CORPECTOMY    . ANTERIOR CERVICAL CORPECTOMY  12/2014   for infection. this was in [place]  . CATARACT EXTRACTION     both eyes  . COLOSTOMY REVERSAL     had infection on buttocks and had to have a skin graft- had colostomy to help area stay clean and heal  . HERNIA REPAIR    . SPINE SURGERY      Current Medications: Current Meds  Medication Sig  . atorvastatin (LIPITOR) 10 MG tablet Take 1 tablet (10 mg total) by mouth daily.  Marland Kitchen BEVESPI AEROSPHERE 9-4.8 MCG/ACT AERO INHALE 2 PUFFS INTO THE LUNGS 2 (TWO) TIMES DAILY.  . Calcium Carb-Cholecalciferol (CALCIUM-VITAMIN D3) 600-400 MG-UNIT TABS Take by mouth.  . folic acid (FOLVITE) 1 MG tablet TAKE 2 TABLETS (2 MG TOTAL) BY MOUTH DAILY.  . methotrexate 50 MG/2ML injection INJECT 0.3 ML (7.5 MG) INTO THE SKIN ONCE A WEEK (EXPIRES IN 28 DAYS)  . MYRBETRIQ 50 MG TB24 tablet   . TUBERCULIN SYR 1CC/27GX1/2" (B-D TB SYRINGE 1CC/27GX1/2") 27G X 1/2" 1 ML MISC INJECT 1 SYRINGE INTO THE SKIN ONCE A WEEK. TO BE USED WITH WEEKLY METHOTREXATE INJECTIONS.  Marland Kitchen XELJANZ 5 MG TABS TAKE 1 TABLET (5 MG) TWICE A DAY  . [DISCONTINUED] atorvastatin (LIPITOR) 10 MG tablet Take 1 tablet (10 mg total) by mouth daily.     Allergies:   Patient has no known allergies.   Social History   Socioeconomic History  . Marital status: Widowed    Spouse name:  Not on file  . Number of children: Not on file  . Years of education: Not on file  . Highest education level: Not on file  Occupational History  . Not on file  Social Needs  . Financial resource strain: Not on file  . Food insecurity:    Worry: Not on file    Inability: Not on file  . Transportation needs:    Medical: Not on file    Non-medical: Not on file  Tobacco Use  . Smoking status: Former Smoker    Packs/day: 1.00    Years: 17.00    Pack years: 17.00    Types: Cigarettes    Last attempt to quit: 12/23/1993    Years since quitting: 25.1  . Smokeless tobacco: Never Used  Substance and Sexual Activity  . Alcohol use: No    Alcohol/week: 0.0 standard drinks  . Drug use: No  . Sexual activity: Yes  Lifestyle  .  Physical activity:    Days per week: Not on file    Minutes per session: Not on file  . Stress: Not on file  Relationships  . Social connections:    Talks on phone: Not on file    Gets together: Not on file    Attends religious service: Not on file    Active member of club or organization: Not on file    Attends meetings of clubs or organizations: Not on file    Relationship status: Not on file  Other Topics Concern  . Not on file  Social History Narrative   Retired. Lives alone.      Family History:  The patient's family history includes Diabetes in his brother, father, and sister; Heart disease in his brother and father; Hyperlipidemia in his sister; Hypertension in his father and mother; Stroke in his brother.   ROS:   Please see the history of present illness.    Review of Systems  Constitution: Negative.  HENT: Negative.   Cardiovascular: Positive for chest pain, dyspnea on exertion, irregular heartbeat and palpitations.  Respiratory: Positive for shortness of breath.   Endocrine: Negative.   Hematologic/Lymphatic: Negative.   Musculoskeletal: Positive for joint swelling.  Gastrointestinal: Negative.   Genitourinary: Negative.   Neurological: Negative.    All other systems reviewed and are negative.   PHYSICAL EXAM:   VS:  BP 124/70   Pulse 100   Ht 5\' 1"  (1.549 m)   Wt 152 lb 6.4 oz (69.1 kg)   SpO2 90%   BMI 28.80 kg/m   Physical Exam  GEN: Well nourished, well developed, in no acute distress  Neck: Bilateral carotid bruits, no JVD,  or masses Cardiac:RRR; positive S4, 1/6 to 2/6 systolic murmur at the left sternal border Respiratory: Decreased breath sounds throughout GI: soft, nontender, nondistended, + BS Ext: without cyanosis, clubbing, or edema, Good distal pulses bilaterally Neuro:  Alert and Oriented x 3 Psych: euthymic mood, full affect  Wt Readings from Last 3 Encounters:  02/09/19 152 lb 6.4 oz (69.1 kg)  12/02/18 150 lb 12.8 oz (68.4 kg)    11/26/18 150 lb 12.8 oz (68.4 kg)      Studies/Labs Reviewed:   EKG:  EKG is not ordered today.  The ekg reviewed from 01/29/2019 normal sinus rhythm, normal EKG Recent Labs: 01/13/2019: ALT 29; BUN 14; Creat 1.34; Hemoglobin 12.7; Platelets 321; Potassium 4.8; Sodium 138   Lipid Panel    Component Value Date/Time   CHOL 169 08/03/2018 1509   TRIG  160 (H) 08/03/2018 1509   HDL 61 08/03/2018 1509   CHOLHDL 2.8 08/03/2018 1509   CHOLHDL 2.6 10/23/2017 0932   VLDL 28 07/31/2017 1053   LDLCALC 76 08/03/2018 1509   LDLCALC 76 10/23/2017 0932    Additional studies/ records that were reviewed today include:  NST 10/2017Study Highlights    Nuclear stress EF: 63%.  Blood pressure demonstrated a normal response to exercise.  There was no ST segment deviation noted during stress.  The study is normal.  This is a low risk study.  The left ventricular ejection fraction is normal (55-65%).  Normal perfusion      2D echo 10/2017Study Conclusions   - Left ventricle: The cavity size was normal. There was mild focal   basal hypertrophy of the septum. Systolic function was normal.   The estimated ejection fraction was in the range of 55% to 60%.   Wall motion was normal; there were no regional wall motion   abnormalities. Doppler parameters are consistent with abnormal   left ventricular relaxation (grade 1 diastolic dysfunction). - Aortic valve: Transvalvular velocity was within the normal range.   There was no stenosis. There was no regurgitation. - Mitral valve: Transvalvular velocity was within the normal range.   There was no evidence for stenosis. There was trivial   regurgitation. - Right ventricle: The cavity size was normal. Wall thickness was   normal. Systolic function was normal. - Tricuspid valve: There was mild regurgitation. - Pulmonary arteries: Systolic pressure was within the normal   range. PA peak pressure: 28 mm Hg (S).      ASSESSMENT:    1.  Precordial pain   2. Intermittent palpitations   3. Other emphysema ([place])   4. Rheumatoid arthritis involving multiple sites, unspecified rheumatoid factor presence (HCC)   5. Elevated LDL cholesterol level   6. Bilateral carotid bruits      PLAN:  In order of problems listed above:  History of chest pain felt to be atypical in the past with normal NST in 2017, patient with constant chest tightness that does not change with activity.  Felt secondary to emphysema in the past.  He does have CV risk factors including hyperlipidemia, former smoker and family history of CAD.  We will do a screening Lexiscan.  Palpitations have been lasting an hour or 2 on 3 occasions.  Will place 30-day monitor.  Decrease caffeine intake.  Check TSH follow-up with me after testing done.  Emphysema quit smoking  Rheumatoid arthritis on chronic methotrexate  Hyperlipidemia LDL 76 on 08/03/2018 continue Lipitor 10 mg daily.  Carotid bruits and had life screening test in 2018 that showed mild to moderate disease.  Check Dopplers  Medication Adjustments/Labs and Tests Ordered: Current medicines are reviewed at length with the patient today.  Concerns regarding medicines are outlined above.  Medication changes, Labs and Tests ordered today are listed in the Patient Instructions below. Patient Instructions  Medication Instructions:  Your physician recommends that you continue on your current medications as directed. Please refer to the Current Medication list given to you today.  If you need a refill on your cardiac medications before your next appointment, please call your pharmacy.   Lab work: TODAY: TSH  If you have labs (blood work) drawn today and your tests are completely normal, you will receive your results only by: Marland Kitchen MyChart Message (if you have MyChart) OR . A paper copy in the mail If you have any lab test that is abnormal  or we need to change your treatment, we will call you to review the  results.  Testing/Procedures: Your physician has requested that you have a carotid duplex. This test is an ultrasound of the carotid arteries in your neck. It looks at blood flow through these arteries that supply the brain with blood. Allow one hour for this exam. There are no restrictions or special instructions.  Your physician has recommended that you wear an event monitor. Event monitors are medical devices that record the heart's electrical activity. Doctors most often [place] these monitors to diagnose arrhythmias. Arrhythmias are problems with the speed or rhythm of the heartbeat. The monitor is a small, portable device. You can wear one while you do your normal daily activities. This is usually used to diagnose what is causing palpitations/syncope (passing out).  Your physician has requested that you have a lexiscan myoview. For further information please visit HugeFiesta.[email or web address]. Please follow instruction sheet, as given.  Follow-Up: . Your physician recommends that you schedule a follow-up appointment in: 1 MONTH with Ermalinda Barrios, PA .   Any Other Special Instructions Will Be Listed Below (If Applicable).  Decrease the amount of caffeine in your diet      Signed, Ermalinda Barrios, PA-C  02/09/2019 8:16 AM    [address], [place], [place]  [address] Phone: [phone]; Fax: [phone]

## 2019-02-09 ENCOUNTER — Ambulatory Visit (INDEPENDENT_AMBULATORY_CARE_PROVIDER_SITE_OTHER): Payer: Medicare Other | Admitting: Physician Assistant

## 2019-02-09 ENCOUNTER — Encounter: Payer: Self-pay | Admitting: Physician Assistant

## 2019-02-09 VITALS — BP 124/70 | HR 100 | Ht 61.0 in | Wt 152.4 lb

## 2019-02-09 DIAGNOSIS — I2510 Atherosclerotic heart disease of native coronary artery without angina pectoris: Secondary | ICD-10-CM | POA: Diagnosis not present

## 2019-02-09 DIAGNOSIS — R072 Precordial pain: Secondary | ICD-10-CM | POA: Diagnosis not present

## 2019-02-09 DIAGNOSIS — E7800 Pure hypercholesterolemia, unspecified: Secondary | ICD-10-CM | POA: Diagnosis not present

## 2019-02-09 DIAGNOSIS — M069 Rheumatoid arthritis, unspecified: Secondary | ICD-10-CM | POA: Diagnosis not present

## 2019-02-09 DIAGNOSIS — R0989 Other specified symptoms and signs involving the circulatory and respiratory systems: Secondary | ICD-10-CM

## 2019-02-09 DIAGNOSIS — J438 Other emphysema: Secondary | ICD-10-CM | POA: Diagnosis not present

## 2019-02-09 DIAGNOSIS — R002 Palpitations: Secondary | ICD-10-CM

## 2019-02-09 HISTORY — DX: Other specified symptoms and signs involving the circulatory and respiratory systems: R09.89

## 2019-02-09 MED ORDER — ATORVASTATIN CALCIUM 10 MG PO TABS: 10.0000 mg | ORAL_TABLET | Freq: Every day | ORAL | 3 refills | Status: DC

## 2019-02-09 NOTE — Patient Instructions (Signed)
Medication Instructions:  Your physician recommends that you continue on your current medications as directed. Please refer to the Current Medication list given to you today.  If you need a refill on your cardiac medications before your next appointment, please call your pharmacy.   Lab work: TODAY: TSH  If you have labs (blood work) drawn today and your tests are completely normal, you will receive your results only by: Marland Kitchen MyChart Message (if you have MyChart) OR . A paper copy in the mail If you have any lab test that is abnormal or we need to change your treatment, we will call you to review the results.  Testing/Procedures: Your physician has requested that you have a carotid duplex. This test is an ultrasound of the carotid arteries in your neck. It looks at blood flow through these arteries that supply the brain with blood. Allow one hour for this exam. There are no restrictions or special instructions.  Your physician has recommended that you wear an event monitor. Event monitors are medical devices that record the heart's electrical activity. Doctors most often [place] these monitors to diagnose arrhythmias. Arrhythmias are problems with the speed or rhythm of the heartbeat. The monitor is a small, portable device. You can wear one while you do your normal daily activities. This is usually used to diagnose what is causing palpitations/syncope (passing out).  Your physician has requested that you have a lexiscan myoview. For further information please visit [email or web address].[email or web address]. Please follow instruction sheet, as given.  Follow-Up: . Your physician recommends that you schedule a follow-up appointment in: 1 MONTH with Ermalinda Barrios, PA .   Any Other Special Instructions Will Be Listed Below (If Applicable).  Decrease the amount of caffeine in your diet

## 2019-02-10 ENCOUNTER — Telehealth (HOSPITAL_COMMUNITY): Payer: Self-pay | Admitting: *Deleted

## 2019-02-10 LAB — TSH: TSH: 2.59 u[IU]/mL (ref 0.450–4.500)

## 2019-02-10 NOTE — Telephone Encounter (Signed)
Patient given detailed instructions per Myocardial Perfusion Study Information Sheet for the test on 02/16/19 at 0800. Patient notified to arrive 15 minutes early and that it is imperative to arrive on time for appointment to keep from having the test rescheduled.  If you need to cancel or reschedule your appointment, please call the office within 24 hours of your appointment. . Patient verbalized understanding., Tony Mcmillan letter sent for test.

## 2019-02-15 ENCOUNTER — Ambulatory Visit: Payer: Medicare Other | Admitting: Family Medicine

## 2019-02-15 ENCOUNTER — Ambulatory Visit (HOSPITAL_COMMUNITY)
Admission: RE | Admit: 2019-02-15 | Discharge: 2019-02-15 | Disposition: A | Discharge status: Home / Self Care | Payer: Medicare Other | Source: Ambulatory Visit | Attending: Cardiovascular Disease | Admitting: Cardiovascular Disease

## 2019-02-15 DIAGNOSIS — R0989 Other specified symptoms and signs involving the circulatory and respiratory systems: Secondary | ICD-10-CM

## 2019-02-16 ENCOUNTER — Ambulatory Visit (HOSPITAL_COMMUNITY): Payer: Medicare Other | Attending: Cardiology

## 2019-02-16 ENCOUNTER — Encounter (INDEPENDENT_AMBULATORY_CARE_PROVIDER_SITE_OTHER): Payer: Medicare Other

## 2019-02-16 ENCOUNTER — Encounter (HOSPITAL_COMMUNITY): Payer: Medicare Other

## 2019-02-16 DIAGNOSIS — R002 Palpitations: Secondary | ICD-10-CM | POA: Diagnosis not present

## 2019-02-16 DIAGNOSIS — R072 Precordial pain: Secondary | ICD-10-CM | POA: Diagnosis not present

## 2019-02-16 LAB — MYOCARDIAL PERFUSION IMAGING
LV dias vol: 42 mL (ref 62–150)
LV sys vol: 18 mL
Peak HR: 112 {beats}/min
Rest HR: 72 {beats}/min
SDS: 1
SRS: 0
SSS: 1
TID: 1.06

## 2019-02-16 MED ORDER — REGADENOSON 0.4 MG/5ML IV SOLN
0.4000 mg | Freq: Once | INTRAVENOUS | Status: AC
  Administered 2019-02-16: 0.4 mg via INTRAVENOUS

## 2019-02-16 MED ORDER — TECHNETIUM TC 99M TETROFOSMIN IV KIT
31.1000 | PACK | Freq: Once | INTRAVENOUS | Status: AC | PRN
  Administered 2019-02-16: 31.1 via INTRAVENOUS
  Filled 2019-02-16: qty 32

## 2019-02-16 MED ORDER — TECHNETIUM TC 99M TETROFOSMIN IV KIT
10.7000 | PACK | Freq: Once | INTRAVENOUS | Status: AC | PRN
  Administered 2019-02-16: 10.7 via INTRAVENOUS
  Filled 2019-02-16: qty 11

## 2019-02-22 DIAGNOSIS — H04123 Dry eye syndrome of bilateral lacrimal glands: Secondary | ICD-10-CM | POA: Diagnosis not present

## 2019-02-22 DIAGNOSIS — Z961 Presence of intraocular lens: Secondary | ICD-10-CM | POA: Diagnosis not present

## 2019-02-24 DIAGNOSIS — N401 Enlarged prostate with lower urinary tract symptoms: Secondary | ICD-10-CM | POA: Diagnosis not present

## 2019-02-24 DIAGNOSIS — R3121 Asymptomatic microscopic hematuria: Secondary | ICD-10-CM | POA: Diagnosis not present

## 2019-02-24 DIAGNOSIS — R351 Nocturia: Secondary | ICD-10-CM | POA: Diagnosis not present

## 2019-03-04 ENCOUNTER — Telehealth: Payer: Self-pay | Admitting: Rheumatology

## 2019-03-04 MED ORDER — TOFACITINIB CITRATE 5 MG PO TABS: ORAL_TABLET | ORAL | 0 refills | Status: DC

## 2019-03-04 NOTE — Telephone Encounter (Signed)
Medication Samples have been provided to the patient.  Drug name: Morrie Sheldon   Strength: 5 mg     Qty: 1  LOT: [id number]  Exp.Date:08/2020  Dosing instructions: Take 1 tablet by mouth twice daily.   The patient has been instructed regarding the correct time, dose, and frequency of taking this medication, including desired effects and most common side effects.   Gwenlyn Perking 4:48 PM 03/04/2019

## 2019-03-04 NOTE — Telephone Encounter (Signed)
Last Visit: 11/26/18 Next Visit: 04/26/19 Labs: 01/13/19 stable   Okay to refill per Dr. Estanislado Pandy

## 2019-03-04 NOTE — Telephone Encounter (Signed)
Patient left a voicemail stating he received a call from [facility] stating they haven't received a prescription refill for his Tony Mcmillan.  Patient requested a return call.

## 2019-03-05 ENCOUNTER — Telehealth: Payer: Self-pay | Admitting: Pharmacy Technician

## 2019-03-05 DIAGNOSIS — M0609 Rheumatoid arthritis without rheumatoid factor, multiple sites: Secondary | ICD-10-CM

## 2019-03-05 MED ORDER — UPADACITINIB ER 15 MG PO TB24: 15.0000 mg | ORAL_TABLET | Freq: Every day | ORAL | 0 refills | Status: DC

## 2019-03-05 NOTE — Telephone Encounter (Signed)
Received a fax from [employer] regarding a prior authorization for [facility]. Authorization has been APPROVED from 02/03/2019 to 06/03/2019.   Will send document to scan center.  Authorization # [id number]

## 2019-03-05 NOTE — Telephone Encounter (Signed)
Received call from [facility] stating that the patient was calling for refills of Xeljanz. Per notes below, patient is switching to Rinvoq. Advised pharmacy to disregard the request.  Called patient, and he didn't really remember the information about the medication change since it has been a few months. Transferred the patient to [employer].

## 2019-03-05 NOTE — Telephone Encounter (Signed)
Patient had questions about his medication.  Informed patient that at last office visit we had discussed switching to Rinvoq as he felt he was not having an adequate response with methotrexate and Xeljanz.  We did not send in a new prescription as the patient still had 2 months worth of Xeljanz on hand.  Patient recalled conversation.  Patient expressed concern about switching from [place] to "a stronger medication" that could increase his risk of infection, especially with the current coronaviris.  Informed patient that Rinvoq is in the same class as Morrie Sheldon and is not anymore immunosuppressing, some people just have a better response.  Acknowledged concerns about infection risk and educated patient on how to minimize risk such as good hand hygiene, staying away from those that are sick, and avoiding large crowds.  Patient verbalized understanding.  Prescription sent to [facility].  Instructed patient to not take medications together.  He may start Rinvoq the next day.  All questions encouraged and answered.  Instructed patient to call with any further questions or concerns.  Mariella Saa, PharmD, [facility] Rheumatology Clinical Pharmacist  03/05/2019 10:21 AM

## 2019-03-05 NOTE — Telephone Encounter (Signed)
Received a Prior Authorization request from [place] for [place]. Authorization has been submitted to patient's insurance via Cover My Meds. Will update once we receive a response.

## 2019-03-16 ENCOUNTER — Telehealth: Payer: Self-pay | Admitting: Rheumatology

## 2019-03-16 NOTE — Telephone Encounter (Signed)
Spoke with the pharmacy and they are ready to ship medication to patient and they have tried to reach patient to sip medication. Attempted to contact patient and left message to advise patient. Left contact number for patient.

## 2019-03-16 NOTE — Telephone Encounter (Signed)
Patient called requesting a return call to let him know if he should continue taking Morrie Sheldon.  Patient states he was told to take the medication until he receives his new prescription of Rinvoq from the specialty pharmacy.  Patient states he has not received the medication.

## 2019-03-17 ENCOUNTER — Telehealth: Payer: Self-pay | Admitting: Rheumatology

## 2019-03-17 NOTE — Telephone Encounter (Signed)
Patient left a message on voicemail 03/16/2019 stating he is waiting on a return call to discuss if he is to continue his arthritis medication. Please call to advise.

## 2019-03-17 NOTE — Telephone Encounter (Signed)
Regarding your medications we recommend you continue them at this time as prescribed. In the event you do become sick please follow recommended procedure to hold medication until symptoms have resolved.  We understand concerns about increased risk of infection. If you do choose to stop your medications understand you are putting yourself at risk for a flare. Please call the office for any questions or acute issues. We are allowing some flexibly for labs and follow up visit. If you are having current respiratory symptoms do not come to the office, please utilize e-visits and your PCP for appropriate assessment and testing.  

## 2019-03-20 ENCOUNTER — Telehealth: Payer: Self-pay

## 2019-03-20 NOTE — Telephone Encounter (Signed)
Called and left a message for patient to call back regarding his appointment with Ermalinda Barrios, PA on 3/31. Will see if patient is willing to do [place] appointment. Also waiting for monitor results to come back. May need to push appointment out a little if we do not get results back.

## 2019-03-22 ENCOUNTER — Telehealth: Payer: Self-pay | Admitting: Physician Assistant

## 2019-03-22 ENCOUNTER — Telehealth: Payer: Self-pay

## 2019-03-22 NOTE — Telephone Encounter (Signed)
Follow up    Patient is returning call. He called on 03/28/ at 2:45pm and left a message via the answering service.

## 2019-03-22 NOTE — Progress Notes (Signed)
Virtual Visit via Video Note    Evaluation Performed:  Follow-up visit  This visit type was conducted due to national recommendations for restrictions regarding the COVID-19 Pandemic (e.g. social distancing).  This format is felt to be most appropriate for this patient at this time.  All issues noted in this document were discussed and addressed.  No physical exam was performed (except for noted visual exam findings with Video Visits).  Please refer to the patient's chart (MyChart message for video visits and phone note for telephone visits) for the patient's consent to telehealth for [facility].  Date:  03/23/2019   ID:  Tony Mcmillan, DOB 1941/07/01, MRN [id number]  Patient Location:  Home  Provider location:   Home   PCP:  Girtha Rm, NP-C  Cardiologist:  No primary care provider on file.was to see Dr. Johnsie Cancel as needed but has never seen MD in office Electrophysiologist:  None   Chief Complaint:  Follow up  History of Present Illness:    Tony Mcmillan is a 78 y.o. male who presents via audio/video conferencing for a telehealth visit today.   Patient has history of atypical chest pain with normal NST 09/2016, echo at that time normal LVEF with grade 1 DD.  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  I saw the patient 02/09/2019 at which time complaining of his heart fluttering and going fast as well as associated sharp shooting chest pain.  He was drinking 2 cups of coffee and 2 Cokes a day.  He also had a life screening test in 2018 that showed mild to moderate carotid stenosis.  Nuclear stress test 02/16/2019 was normal without ischemia, carotid Dopplers were 02/15/2019 without stenosis and 30-day monitor normal sinus rhythm with PACs that he was symptomatic with.  TSH was normal.  Patient has cut out his coffee but is still drinking soda on occasion.  His palpitations have settled down.  He has emphysema  and has chronic dyspnea on exertion and is worried about getting a coronavirus.  He is staying out of the public and keeping social distance.  He denies any further chest pain, dyspnea at rest dizziness or presyncope.  He has some leg edema.  All test above discussed with patient.  He says his arthritis medication supposed to be changed next week after he has labs.  The patient does not symptoms concerning for COVID-19 infection (fever, chills, cough, or new shortness of breath).    Prior CV studies:   The following studies were reviewed today:  NST 2/25/2020Study Highlights      Nuclear stress EF: 58%.  There was no ST segment deviation noted during stress.  The study is normal.  This is a low risk study.  The left ventricular ejection fraction is normal (55-65%).   Normal stress nuclear study with no ischemia or infarction.  Gated ejection fraction 58% with normal wall motion.    Chronic Dopplers 2/24/2020Summary: Right Carotid: There is no evidence of stenosis in the right ICA.   Left Carotid: There is no evidence of stenosis in the left ICA.   Vertebrals:  Bilateral vertebral arteries demonstrate antegrade flow. Subclavians: Normal flow hemodynamics were seen in bilateral subclavian              arteries.   30-day monitor 03/22/2019  NSR PAC;s Occasional symptoms relate to PAC;s       Past Medical History:  Diagnosis Date  . Anemia   .  Atypical chest pain    a. 09/2016 MV: EF 63%, normal perfusion.  . Blood transfusion without reported diagnosis   . Diastolic dysfunction    a. 2015 Echo: EF >55%;  b. 09/2016 Echo: EF 55-60%, no rwma, Gr1 DD, triv AI/MR, mild TR, PASP 54mmHg.  . Emphysema of lung ([place]) 02/08/2016  . GERD (gastroesophageal reflux disease)   . History of systemic steroid therapy 10/30/2017  . Rheumatoid arthritis [facility])    Past Surgical History:  Procedure Laterality Date  . ANTERIOR CERVICAL CORPECTOMY    . ANTERIOR CERVICAL CORPECTOMY  12/2014    for infection. this was in [place]  . CATARACT EXTRACTION     both eyes  . COLOSTOMY REVERSAL     had infection on buttocks and had to have a skin graft- had colostomy to help area stay clean and heal  . HERNIA REPAIR    . SPINE SURGERY       Current Meds  Medication Sig  . atorvastatin (LIPITOR) 10 MG tablet Take 1 tablet (10 mg total) by mouth daily.  Marland Kitchen BEVESPI AEROSPHERE 9-4.8 MCG/ACT AERO INHALE 2 PUFFS INTO THE LUNGS 2 (TWO) TIMES DAILY.  . Calcium Carb-Cholecalciferol (CALCIUM-VITAMIN D3) 600-400 MG-UNIT TABS Take by mouth.  . folic acid (FOLVITE) 1 MG tablet TAKE 2 TABLETS (2 MG TOTAL) BY MOUTH DAILY.  . methotrexate 50 MG/2ML injection INJECT 0.3 ML (7.5 MG) INTO THE SKIN ONCE A WEEK (EXPIRES IN 28 DAYS)  . MYRBETRIQ 50 MG TB24 tablet   . Tofacitinib Citrate (XELJANZ) 5 MG TABS Take 5 mg by mouth 2 (two) times daily.  . TUBERCULIN SYR 1CC/27GX1/2" (B-D TB SYRINGE 1CC/27GX1/2") 27G X 1/2" 1 ML MISC INJECT 1 SYRINGE INTO THE SKIN ONCE A WEEK. TO BE USED WITH WEEKLY METHOTREXATE INJECTIONS.     Allergies:   Patient has no known allergies.   Social History   Tobacco Use  . Smoking status: Former Smoker    Packs/day: 1.00    Years: 17.00    Pack years: 17.00    Types: Cigarettes    Last attempt to quit: 12/23/1993    Years since quitting: 25.2  . Smokeless tobacco: Never Used  Substance Use Topics  . Alcohol use: No    Alcohol/week: 0.0 standard drinks  . Drug use: No     Family Hx: The patient's family history includes Diabetes in his brother, father, and sister; Heart disease in his brother and father; Hyperlipidemia in his sister; Hypertension in his father and mother; Stroke in his brother. There is no history of Colon polyps, Esophageal cancer, Stomach cancer, Rectal cancer, or Colon cancer.  ROS:   Please see the history of present illness.    Review of Systems  Cardiovascular: Positive for dyspnea on exertion.  Musculoskeletal: Positive for arthritis.   All  other systems reviewed and are negative.   Labs/Other Tests and Data Reviewed:    Recent Labs: 01/13/2019: ALT 29; BUN 14; Creat 1.34; Hemoglobin 12.7; Platelets 321; Potassium 4.8; Sodium 138 02/09/2019: TSH 2.590   Recent Lipid Panel Lab Results  Component Value Date/Time   CHOL 169 08/03/2018 03:09 PM   TRIG 160 (H) 08/03/2018 03:09 PM   HDL 61 08/03/2018 03:09 PM   CHOLHDL 2.8 08/03/2018 03:09 PM   CHOLHDL 2.6 10/23/2017 09:32 AM   LDLCALC 76 08/03/2018 03:09 PM   LDLCALC 76 10/23/2017 09:32 AM    Wt Readings from Last 3 Encounters:  03/23/19 146 lb (66.2 kg)  02/16/19 152 lb (  68.9 kg)  02/09/19 152 lb 6.4 oz (69.1 kg)     Exam:    Vital Signs:  BP 121/75   Pulse 77   Ht 5\' 1"  (1.549 m)   Wt 146 lb (66.2 kg)   BMI 27.59 kg/m    Well nourished, well developed male in no  acute distress. No JVD.  Patient does have mild leg edema that is chronic  ASSESSMENT & PLAN:    1.  Atypical chest pain - Lexiscan Myoview 02/16/2019 without ischemia, normal LVEF-no further chest pain  Palpitations 30-day monitor normal sinus rhythm with PACs-patient has cut back on caffeine in his diet and palpitations have improved.  Follow-up as needed with Dr. Johnsie Cancel  Bilateral carotid bruits carotid Dopplers no stenosis  COVID-19 Education: The signs and symptoms of COVID-19 were discussed with the patient and how to seek care for testing (follow up with PCP or arrange E-visit).   The importance of social distancing was discussed today.  Patient Risk:   After full review of this patients clinical status, I feel that they are at least moderate risk at this time.  Time:   Today, I have spent 15 minutes with the patient with telehealth technology discussing test results.   Medication Adjustments/Labs and Tests Ordered: Current medicines are reviewed at length with the patient today.  Concerns regarding medicines are outlined above.  Tests Ordered: No orders of the defined types were  placed in this encounter.  Medication Changes: No orders of the defined types were placed in this encounter.   Disposition:  Follow up prn   Signed, Ermalinda Barrios, PA-C  03/23/2019 12:08 PM    [place]

## 2019-03-22 NOTE — Telephone Encounter (Signed)
I spoke with patient about his upcoming appointment with me scheduled for tomorrow at 10:30 AM.  Because of the coronavirus he is willing to switch to WebEx.  He does have a smart phone and is already on my chart.  Can you please call patient with instructions and set up a video visit so I can go over all his test results.  I do have holter monitor results as well.Thank you

## 2019-03-22 NOTE — Telephone Encounter (Signed)
Patient has been contacted.

## 2019-03-22 NOTE — Telephone Encounter (Signed)
Virtual Visit Pre-Appointment Phone Call   TELEPHONE CALL NOTE  Tony Mcmillan has been deemed a candidate for a follow-up tele-health visit to limit community exposure during the Covid-19 pandemic. I spoke with the patient via phone to ensure availability of phone/video source, confirm preferred email & phone number, and discuss instructions and expectations.  I reminded Tony Mcmillan to be prepared with any vital sign and/or heart rhythm information that could potentially be obtained via home monitoring, at the time of his visit. I reminded Tony Mcmillan to expect a phone call at the time of his visit if his visit.  Did the patient verbally acknowledge consent to treatment?  [name], RN 03/22/2019 2:07 PM   DOWNLOADING THE [place], go to [employer] and type in WebEx in the search bar. [place] [employer], the blue/green circle. The app is free but as with any other app downloads, their phone may require them to verify saved payment information or Apple password. The patient does NOT have to create an account.  - If Android, ask patient to go to [employer] and type in WebEx in the search bar. [place] [employer], the blue/green circle. The app is free but as with any other app downloads, their phone may require them to verify saved payment information or Android password. The patient does NOT have to create an account.   CONSENT FOR TELE-HEALTH VISIT - PLEASE REVIEW  I hereby voluntarily request, consent and authorize [facility] and its employed or contracted physicians, physician assistants, nurse practitioners or other licensed health care professionals (the Practitioner), to provide me with telemedicine health care services (the "Services") as deemed necessary by the treating Practitioner. I acknowledge and consent to receive the Services by the Practitioner via telemedicine. I understand that the telemedicine visit  will involve communicating with the Practitioner through live audiovisual communication technology and the disclosure of certain medical information by electronic transmission. I acknowledge that I have been given the opportunity to request an in-person assessment or other available alternative prior to the telemedicine visit and am voluntarily participating in the telemedicine visit.  I understand that I have the right to withhold or withdraw my consent to the use of telemedicine in the course of my care at any time, without affecting my right to future care or treatment, and that the Practitioner or I may terminate the telemedicine visit at any time. I understand that I have the right to inspect all information obtained and/or recorded in the course of the telemedicine visit and may receive copies of available information for a reasonable fee.  I understand that some of the potential risks of receiving the Services via telemedicine include:  Marland Kitchen Delay or interruption in medical evaluation due to technological equipment failure or disruption; . Information transmitted may not be sufficient (e.g. poor resolution of images) to allow for appropriate medical decision making by the Practitioner; and/or  . In rare instances, security protocols could fail, causing a breach of personal health information.  Furthermore, I acknowledge that it is my responsibility to provide information about my medical history, conditions and care that is complete and accurate to the best of my ability. I acknowledge that Practitioner's advice, recommendations, and/or decision may be based on factors not within their control, such as incomplete or inaccurate data provided by me or distortions of diagnostic images or specimens that may result from electronic transmissions. I understand that the practice of  medicine is not an Chief Strategy Officer and that Practitioner makes no warranties or guarantees regarding treatment outcomes. I acknowledge  that I will receive a copy of this consent concurrently upon execution via email to the email address I last provided but may also request a printed copy by calling the office of [place].    I understand that my insurance will be billed for this visit.   I have read or had this consent read to me. . I understand the contents of this consent, which adequately explains the benefits and risks of the Services being provided via telemedicine.  . I have been provided ample opportunity to ask questions regarding this consent and the Services and have had my questions answered to my satisfaction. . I give my informed consent for the services to be provided through the use of telemedicine in my medical care  By participating in this telemedicine visit I agree to the above.

## 2019-03-22 NOTE — Telephone Encounter (Signed)
Patient has been contacted and VIDEO Visit arranged for 3/31

## 2019-03-23 ENCOUNTER — Telehealth (INDEPENDENT_AMBULATORY_CARE_PROVIDER_SITE_OTHER): Payer: Medicare Other | Admitting: Physician Assistant

## 2019-03-23 ENCOUNTER — Other Ambulatory Visit: Payer: Self-pay

## 2019-03-23 ENCOUNTER — Telehealth: Payer: Self-pay | Admitting: Rheumatology

## 2019-03-23 ENCOUNTER — Encounter: Payer: Self-pay | Admitting: Physician Assistant

## 2019-03-23 VITALS — BP 121/75 | HR 77 | Ht 61.0 in | Wt 146.0 lb

## 2019-03-23 DIAGNOSIS — Z79899 Other long term (current) drug therapy: Secondary | ICD-10-CM

## 2019-03-23 DIAGNOSIS — R072 Precordial pain: Secondary | ICD-10-CM | POA: Diagnosis not present

## 2019-03-23 DIAGNOSIS — R002 Palpitations: Secondary | ICD-10-CM | POA: Diagnosis not present

## 2019-03-23 DIAGNOSIS — R0989 Other specified symptoms and signs involving the circulatory and respiratory systems: Secondary | ICD-10-CM | POA: Diagnosis not present

## 2019-03-23 NOTE — Telephone Encounter (Signed)
Patient going to [facility] on [address] for labs. Please release orders.

## 2019-03-23 NOTE — Telephone Encounter (Signed)
Lab Orders released.  

## 2019-03-23 NOTE — Patient Instructions (Signed)
Medication Instructions:  Your physician recommends that you continue on your current medications as directed. Please refer to the Current Medication list given to you today.  If you need a refill on your cardiac medications before your next appointment, please call your pharmacy.   Lab work: None Ordered  If you have labs (blood work) drawn today and your tests are completely normal, you will receive your results only by: Marland Kitchen MyChart Message (if you have MyChart) OR . A paper copy in the mail If you have any lab test that is abnormal or we need to change your treatment, we will call you to review the results.  Testing/Procedures: None ordered  Follow-Up: . AS NEEDED  Any Other Special Instructions Will Be Listed Below (If Applicable).

## 2019-03-24 DIAGNOSIS — Z79899 Other long term (current) drug therapy: Secondary | ICD-10-CM | POA: Diagnosis not present

## 2019-03-25 ENCOUNTER — Encounter: Payer: Self-pay | Admitting: Rheumatology

## 2019-03-25 ENCOUNTER — Other Ambulatory Visit: Payer: Self-pay | Admitting: Rheumatology

## 2019-03-25 LAB — CBC WITH DIFFERENTIAL/PLATELET
Absolute Monocytes: 540 cells/uL (ref 200–950)
Basophils Absolute: 20 cells/uL (ref 0–200)
Basophils Relative: 0.4 %
Eosinophils Absolute: 50 cells/uL (ref 15–500)
Eosinophils Relative: 1 %
HCT: 45.6 % (ref 38.5–50.0)
Hemoglobin: 15.4 g/dL (ref 13.2–17.1)
Lymphs Abs: 410 cells/uL — ABNORMAL LOW (ref 850–3900)
MCH: 29.1 pg (ref 27.0–33.0)
MCHC: 33.8 g/dL (ref 32.0–36.0)
MCV: 86 fL (ref 80.0–100.0)
MPV: 9.8 fL (ref 7.5–12.5)
Monocytes Relative: 10.8 %
Neutro Abs: 3980 cells/uL (ref 1500–7800)
Neutrophils Relative %: 79.6 %
Platelets: 317 10*3/uL (ref 140–400)
RBC: 5.3 10*6/uL (ref 4.20–5.80)
RDW: 16 % — ABNORMAL HIGH (ref 11.0–15.0)
Total Lymphocyte: 8.2 %
WBC: 5 10*3/uL (ref 3.8–10.8)

## 2019-03-25 LAB — COMPLETE METABOLIC PANEL WITH GFR
AG Ratio: 1.3 (calc) (ref 1.0–2.5)
ALT: 17 U/L (ref 9–46)
AST: 20 U/L (ref 10–35)
Albumin: 4.4 g/dL (ref 3.6–5.1)
Alkaline phosphatase (APISO): 55 U/L (ref 35–144)
BUN/Creatinine Ratio: 9 (calc) (ref 6–22)
BUN: 12 mg/dL (ref 7–25)
CO2: 23 mmol/L (ref 20–32)
Calcium: 9.8 mg/dL (ref 8.6–10.3)
Chloride: 103 mmol/L (ref 98–110)
Creat: 1.37 mg/dL — ABNORMAL HIGH (ref 0.70–1.18)
GFR, Est African American: 57 mL/min/{1.73_m2} — ABNORMAL LOW (ref >=60)
GFR, Est Non African American: 49 mL/min/{1.73_m2} — ABNORMAL LOW (ref >=60)
Globulin: 3.3 g/dL (calc) (ref 1.9–3.7)
Glucose, Bld: 90 mg/dL (ref 65–139)
Potassium: 5 mmol/L (ref 3.5–5.3)
Sodium: 138 mmol/L (ref 135–146)
Total Bilirubin: 1 mg/dL (ref 0.2–1.2)
Total Protein: 7.7 g/dL (ref 6.1–8.1)

## 2019-03-25 NOTE — Telephone Encounter (Signed)
I called patient to discuss labs.  He is a still on methotrexate and Xeljanz.  He has not started Rinvoq.  He continues to have some joint pain and joint swelling.  I have advised him to discontinue Xeljanz and switch to Rinvoq.  He stated with the coronavirus epidemic he will have to think about it he is not certain if he wants to change medication or stop them completely.  I advised in case he switch medication he should get labs in 1 month.  If he has good response to Rinvoq then we may be able to discontinue methotrexate in future.

## 2019-03-26 ENCOUNTER — Telehealth: Payer: Self-pay | Admitting: Rheumatology

## 2019-03-26 NOTE — Telephone Encounter (Signed)
Last Visit: 11/25/18 Next Visit: 04/26/19 Labs: 03/24/19 Creat. 1.37 GFR 57, RDW 16.0 Lymphs Abs 410  Okay to refill per Dr. Estanislado Pandy

## 2019-03-26 NOTE — Telephone Encounter (Signed)
Prescription sent to the pharmacy.

## 2019-03-26 NOTE — Telephone Encounter (Signed)
Patient needs refill on MTX sent to [facility].

## 2019-03-27 ENCOUNTER — Other Ambulatory Visit: Payer: Self-pay | Admitting: Rheumatology

## 2019-03-27 DIAGNOSIS — M0609 Rheumatoid arthritis without rheumatoid factor, multiple sites: Secondary | ICD-10-CM

## 2019-03-29 NOTE — Telephone Encounter (Signed)
Last Visit: 11/25/18 Next Visit: 04/26/19 Labs: 03/24/19 Creat. 1.37 GFR 57, RDW 16.0 Lymphs Abs 410  Okay to refill per Dr. Estanislado Pandy

## 2019-03-31 ENCOUNTER — Other Ambulatory Visit: Payer: Self-pay | Admitting: Rheumatology

## 2019-03-31 DIAGNOSIS — M0609 Rheumatoid arthritis without rheumatoid factor, multiple sites: Secondary | ICD-10-CM

## 2019-04-19 NOTE — Progress Notes (Signed)
Virtual Visit via Telephone Note  I connected with Tony Mcmillan on 04/19/19 at 10:45 AM EDT by telephone and verified that I am speaking with the correct person using two identifiers.   I discussed the limitations, risks, security and privacy concerns of performing an evaluation and management service by telephone and the availability of in person appointments. I also discussed with the patient that there may be a patient responsible charge related to this service. The patient expressed understanding and agreed to proceed.  This service was conducted via virtual visit.  Audio tools used.  Unable to reach patient by Webex. The patient was located at home. I was located in my office.  Consent was obtained prior to the virtual visit and is aware of possible charges through their insurance for this visit.  The patient is an established patient.  Dr. Estanislado Pandy, MD conducted the virtual visit and [name], PA-C acted as scribe during the service.  Office staff helped with scheduling follow up visits after the service was conducted.    CC: Left wrist pain and swelling  History of Present Illness: Patient is 78 year old male with a past medical history of seronegative rheumatoid arthritis, elevated CK, and DDD.  He is on Rinvoq 15 mg po daily and MTX 0.3 ml sq once weekly.  He stated on Rinvoq on 03/25/19.  He has not missed any doses of MTX.  He is experiencing left wrist pain and swelling. He had a left wrist injection on 08/26/18 which provided temporary relief. He has limited ROM.  He has complete fist formation. He has some discomfort in the right wrist joint. He has pain in both shoulder joints.  He has intermittent pain and swelling in both hands.  He has not been on prednisone recently.    Review of Systems  Constitutional: Negative for fever and malaise/fatigue.  HENT:       +Dry mouth   Eyes: Negative for photophobia, pain, discharge and redness.  Respiratory: Negative for cough, shortness of  breath and wheezing.   Cardiovascular: Negative for chest pain and palpitations.  Gastrointestinal: Negative for blood in stool, constipation and diarrhea.  Genitourinary: Negative for dysuria.  Musculoskeletal: Negative for back pain, joint pain, myalgias and neck pain.       +Morning stiffness  +Joint swelling  Skin: Negative for rash.  Neurological: Negative for dizziness and headaches.  Psychiatric/Behavioral: Negative for depression. The patient is not nervous/anxious and does not have insomnia.      Observations/Objective: Physical Exam  Constitutional: He is oriented to person, place, and time.  Neurological: He is alert and oriented to person, place, and time.  Psychiatric: Mood, memory, affect and judgment normal.   Patient reports morning stiffness for 30  minutes.   Patient reports nocturnal pain.  Difficulty dressing/grooming: Denies Difficulty climbing stairs: Denies Difficulty getting out of chair: Denies Difficulty using hands for taps, buttons, cutlery, and/or writing: Denies  Assessment and Plan: Rheumatoid arthritis of multiple sites with negative rheumatoid factor ([place]): He is experiencing pain in both wrist joints.  The pain is severe in the left wrist joint, and he has noticed increased left wrist swelling and limited ROM.  He had a left wrist cortisone injection on 08/26/18 that provided temporary relief.  He has not taken prednisone recently.  He has chronic pain in both shoulder joints and intermittent pain in both hands.  He started on Rinvoq 15 mg po daily on 03/25/19.  He continues to inject MTX 0.3 ml  sq once weekly.  He has no missed any doses recently. He will continue on this current treatment regimen.  He was advised to notify us if he develops increased joint pain or joint swelling.  We will give Rinvoq more time to see if it is effective for controlling his RA.  He will follow up in 3 months.   High risk medication use -  Rinvoq 15 mg po daily started on  03/25/19.  He continues to inject MTX 0.3 ml sq once weekly and takes folic acid 1 mg po daily.  He had an inadequate response to [place] 5 mg twice daily in the pasat. CBC and CMP were drawn on 03/24/19.  He is due to update lab work 1 month after starting on Rinvoq. Last TB gold negative on 08/31/2018.  Last lipid panel within normal limits on 08/03/2018.  He is currently taking Lipitor 10 mg managed by his PCP.  We discussed social distancing practices and the standard precautions recommended by the CDC.  He was advised to hold Rinvoq and MTX if he develops an infection to resume these medications once the infection has completely resolved.    Elevated CK - 262 on 10/13/18: At his last office visit patient complained of myalgias and CK was elevated at 262.  ESR normal and anti--[facility] CR antibody was negative.  He is currently on Lipitor 10 mg by mouth daily.    He has no muscle aches or muscle tenderness at this time.    DDD (degenerative disc disease), cervical: He has no neck pain or stiffness at this time.   DDD (degenerative disc disease), lumbar: He has no lower back pain currently.    Follow Up Instructions: He will follow up in 3 months. Standing orders are in place. He does not need any refills at this time.    I discussed the assessment and treatment plan with the patient. The patient was provided an opportunity to ask questions and all were answered. The patient agreed with the plan and demonstrated an understanding of the instructions.   The patient was advised to call back or seek an in-person evaluation if the symptoms worsen or if the condition fails to improve as anticipated.  I provided 25 minutes of non-face-to-face time during this encounter.  Bo Merino, MD   Scribed by-  Ofilia Neas, PA-C

## 2019-04-20 ENCOUNTER — Encounter: Payer: Self-pay | Admitting: Rheumatology

## 2019-04-20 ENCOUNTER — Other Ambulatory Visit: Payer: Self-pay

## 2019-04-20 ENCOUNTER — Telehealth: Payer: Self-pay | Admitting: Rheumatology

## 2019-04-20 ENCOUNTER — Telehealth (INDEPENDENT_AMBULATORY_CARE_PROVIDER_SITE_OTHER): Payer: Medicare Other | Admitting: Rheumatology

## 2019-04-20 DIAGNOSIS — R748 Abnormal levels of other serum enzymes: Secondary | ICD-10-CM

## 2019-04-20 DIAGNOSIS — Z8639 Personal history of other endocrine, nutritional and metabolic disease: Secondary | ICD-10-CM | POA: Diagnosis not present

## 2019-04-20 DIAGNOSIS — M5136 Other intervertebral disc degeneration, lumbar region: Secondary | ICD-10-CM

## 2019-04-20 DIAGNOSIS — Z8711 Personal history of peptic ulcer disease: Secondary | ICD-10-CM

## 2019-04-20 DIAGNOSIS — R3121 Asymptomatic microscopic hematuria: Secondary | ICD-10-CM | POA: Diagnosis not present

## 2019-04-20 DIAGNOSIS — Z86010 Personal history of colonic polyps: Secondary | ICD-10-CM

## 2019-04-20 DIAGNOSIS — Z8719 Personal history of other diseases of the digestive system: Secondary | ICD-10-CM

## 2019-04-20 DIAGNOSIS — M0609 Rheumatoid arthritis without rheumatoid factor, multiple sites: Secondary | ICD-10-CM

## 2019-04-20 DIAGNOSIS — Z87438 Personal history of other diseases of male genital organs: Secondary | ICD-10-CM | POA: Diagnosis not present

## 2019-04-20 DIAGNOSIS — Z8709 Personal history of other diseases of the respiratory system: Secondary | ICD-10-CM | POA: Diagnosis not present

## 2019-04-20 DIAGNOSIS — E785 Hyperlipidemia, unspecified: Secondary | ICD-10-CM

## 2019-04-20 DIAGNOSIS — M5030 Other cervical disc degeneration, unspecified cervical region: Secondary | ICD-10-CM | POA: Diagnosis not present

## 2019-04-20 DIAGNOSIS — Z79899 Other long term (current) drug therapy: Secondary | ICD-10-CM | POA: Diagnosis not present

## 2019-04-20 NOTE — Telephone Encounter (Signed)
Patient called stating he had a virtual appointment with Dr. Estanislado Pandy earlier today.  Patient states he went to [facility] today at 1:00 to have labwork, but was told they didn't have any orders.  Patient states he was confused because during his conversation with Dr. Estanislado Pandy he was told the orders were there.  Patient is requesting a return call.

## 2019-04-20 NOTE — Telephone Encounter (Signed)
Lab orders have been released for [facility]. Attempted to contact patient and left message on machine to advise patient they have been released.

## 2019-04-21 DIAGNOSIS — Z79899 Other long term (current) drug therapy: Secondary | ICD-10-CM | POA: Diagnosis not present

## 2019-04-21 DIAGNOSIS — R748 Abnormal levels of other serum enzymes: Secondary | ICD-10-CM | POA: Diagnosis not present

## 2019-04-22 LAB — CBC WITH DIFFERENTIAL/PLATELET
Absolute Monocytes: 549 cells/uL (ref 200–950)
Basophils Absolute: 18 cells/uL (ref 0–200)
Basophils Relative: 0.4 %
Eosinophils Absolute: 59 cells/uL (ref 15–500)
Eosinophils Relative: 1.3 %
HCT: 42.6 % (ref 38.5–50.0)
Hemoglobin: 14.2 g/dL (ref 13.2–17.1)
Lymphs Abs: 608 cells/uL — ABNORMAL LOW (ref 850–3900)
MCH: 29.3 pg (ref 27.0–33.0)
MCHC: 33.3 g/dL (ref 32.0–36.0)
MCV: 88 fL (ref 80.0–100.0)
MPV: 9.6 fL (ref 7.5–12.5)
Monocytes Relative: 12.2 %
Neutro Abs: 3267 cells/uL (ref 1500–7800)
Neutrophils Relative %: 72.6 %
Platelets: 278 10*3/uL (ref 140–400)
RBC: 4.84 10*6/uL (ref 4.20–5.80)
RDW: 16.2 % — ABNORMAL HIGH (ref 11.0–15.0)
Total Lymphocyte: 13.5 %
WBC: 4.5 10*3/uL (ref 3.8–10.8)

## 2019-04-22 LAB — COMPLETE METABOLIC PANEL WITH GFR
AG Ratio: 1.4 (calc) (ref 1.0–2.5)
ALT: 22 U/L (ref 9–46)
AST: 25 U/L (ref 10–35)
Albumin: 4.3 g/dL (ref 3.6–5.1)
Alkaline phosphatase (APISO): 60 U/L (ref 35–144)
BUN/Creatinine Ratio: 9 (calc) (ref 6–22)
BUN: 12 mg/dL (ref 7–25)
CO2: 24 mmol/L (ref 20–32)
Calcium: 9.3 mg/dL (ref 8.6–10.3)
Chloride: 103 mmol/L (ref 98–110)
Creat: 1.31 mg/dL — ABNORMAL HIGH (ref 0.70–1.18)
GFR, Est African American: 60 mL/min/{1.73_m2} (ref >=60)
GFR, Est Non African American: 52 mL/min/{1.73_m2} — ABNORMAL LOW (ref >=60)
Globulin: 3 g/dL (calc) (ref 1.9–3.7)
Glucose, Bld: 95 mg/dL (ref 65–99)
Potassium: 4.3 mmol/L (ref 3.5–5.3)
Sodium: 137 mmol/L (ref 135–146)
Total Bilirubin: 0.8 mg/dL (ref 0.2–1.2)
Total Protein: 7.3 g/dL (ref 6.1–8.1)

## 2019-04-22 LAB — CK: Total CK: 237 U/L — ABNORMAL HIGH (ref 44–196)

## 2019-04-22 NOTE — Progress Notes (Signed)
Labs are stable. Continue current Tt.

## 2019-04-26 ENCOUNTER — Ambulatory Visit: Payer: Medicare Other | Admitting: Physician Assistant

## 2019-05-05 ENCOUNTER — Telehealth: Payer: Self-pay | Admitting: *Deleted

## 2019-05-05 NOTE — Telephone Encounter (Signed)
Received a Prior Authorization request from [employer] for [facility]. Authorization has been submitted to patient's insurance via Cover My Meds. Will update once we receive a response.  Received a fax from [employer] regarding a prior authorization for [facility]. Authorization has been APPROVED from 04/05/19 to 05/04/2020.   [id number]

## 2019-05-25 ENCOUNTER — Encounter: Payer: Self-pay | Admitting: Rheumatology

## 2019-05-25 ENCOUNTER — Ambulatory Visit (INDEPENDENT_AMBULATORY_CARE_PROVIDER_SITE_OTHER): Payer: Medicare Other | Admitting: Rheumatology

## 2019-05-25 ENCOUNTER — Other Ambulatory Visit: Payer: Self-pay

## 2019-05-25 VITALS — BP 125/77 | HR 90 | Resp 12 | Ht 62.0 in | Wt 154.4 lb

## 2019-05-25 DIAGNOSIS — Z87438 Personal history of other diseases of male genital organs: Secondary | ICD-10-CM | POA: Diagnosis not present

## 2019-05-25 DIAGNOSIS — Z86010 Personal history of colonic polyps: Secondary | ICD-10-CM

## 2019-05-25 DIAGNOSIS — M5136 Other intervertebral disc degeneration, lumbar region: Secondary | ICD-10-CM | POA: Diagnosis not present

## 2019-05-25 DIAGNOSIS — Z8639 Personal history of other endocrine, nutritional and metabolic disease: Secondary | ICD-10-CM | POA: Diagnosis not present

## 2019-05-25 DIAGNOSIS — E785 Hyperlipidemia, unspecified: Secondary | ICD-10-CM

## 2019-05-25 DIAGNOSIS — M0609 Rheumatoid arthritis without rheumatoid factor, multiple sites: Secondary | ICD-10-CM | POA: Diagnosis not present

## 2019-05-25 DIAGNOSIS — R748 Abnormal levels of other serum enzymes: Secondary | ICD-10-CM

## 2019-05-25 DIAGNOSIS — M5030 Other cervical disc degeneration, unspecified cervical region: Secondary | ICD-10-CM

## 2019-05-25 DIAGNOSIS — Z8711 Personal history of peptic ulcer disease: Secondary | ICD-10-CM

## 2019-05-25 DIAGNOSIS — Z79899 Other long term (current) drug therapy: Secondary | ICD-10-CM

## 2019-05-25 DIAGNOSIS — Z8709 Personal history of other diseases of the respiratory system: Secondary | ICD-10-CM

## 2019-05-25 DIAGNOSIS — I2510 Atherosclerotic heart disease of native coronary artery without angina pectoris: Secondary | ICD-10-CM

## 2019-05-25 DIAGNOSIS — Z8719 Personal history of other diseases of the digestive system: Secondary | ICD-10-CM

## 2019-05-25 DIAGNOSIS — R3121 Asymptomatic microscopic hematuria: Secondary | ICD-10-CM | POA: Diagnosis not present

## 2019-05-25 MED ORDER — PREDNISONE 5 MG PO TABS: ORAL_TABLET | ORAL | 0 refills | Status: DC

## 2019-05-25 NOTE — Progress Notes (Signed)
Office Visit Note  Patient: Tony Mcmillan             Date of Birth: 16-Apr-1941           MRN: [id number]             PCP: Girtha Rm, NP-C Referring: Girtha Rm, NP-C Visit Date: 05/25/2019 Occupation: @GUAROCC @  Subjective:  Bilateral wrist joint pain    History of Present Illness: Tony Mcmillan is a 78 y.o. male with history of seronegative rheumatoid arthritis and DDD.  He is taking Rinvoq 15 mg po daily, MTX 0.3 ml sq once weekly, and folic acid 1 mg po daily.  He reports increased pain and swelling in both wrist joints.  He describes the pain as a deep ache. He feels like Morrie Sheldon was more effective than Rinvoq has been.  He has been having progressively worsening pain in both wrist joints.  He continues to walk with a cane.  He has pain in both hands.   He has been taking tylenol for pain relief. He denies any other joint pain or joint swelling. He denies any increased muscle aches or muscle weakness.   Activities of Daily Living:  Patient reports morning stiffness for 30 minutes.   Patient Reports nocturnal pain.  Difficulty dressing/grooming: Denies Difficulty climbing stairs: Denies Difficulty getting out of chair: Denies Difficulty using hands for taps, buttons, cutlery, and/or writing: Reports  Review of Systems  Constitutional: Negative for fatigue and night sweats.  HENT: Negative for mouth sores, mouth dryness and nose dryness.   Eyes: Negative for photophobia, redness, visual disturbance and dryness.  Respiratory: Negative for cough, hemoptysis, shortness of breath and difficulty breathing.   Cardiovascular: Negative for chest pain, palpitations, hypertension, irregular heartbeat and swelling in legs/feet.  Gastrointestinal: Negative for blood in stool, constipation and diarrhea.  Endocrine: Negative for increased urination.  Genitourinary: Negative for painful urination.  Musculoskeletal: Positive for arthralgias, joint pain, joint swelling and morning  stiffness. Negative for myalgias, muscle weakness, muscle tenderness and myalgias.  Skin: Negative for color change, rash, hair loss, nodules/bumps, skin tightness, ulcers and sensitivity to sunlight.  Allergic/Immunologic: Negative for susceptible to infections.  Neurological: Negative for dizziness, fainting, memory loss, night sweats and weakness.  Hematological: Negative for swollen glands.  Psychiatric/Behavioral: Negative for depressed mood and sleep disturbance. The patient is not nervous/anxious.     PMFS History:  Patient Active Problem List   Diagnosis Date Noted  . Bilateral carotid bruits 02/09/2019  . Chest pain 02/08/2019  . Intermittent palpitations 01/29/2019  . Prediabetes 10/29/2018  . History of systemic steroid therapy 10/30/2017  . Feeling of chest tightness, unclear cause, ? due to RA 02/21/2017  . Pulmonary emphysema ([place]) 12/18/2016  . Dyspnea and respiratory abnormality 11/25/2016  . 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[place]) 11/04/2016  . High risk medication use 11/04/2016  . 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 Vitamin D deficiency 11/04/2016  . 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 History of anemia 07/02/2016  . History of colonic polyps 07/02/2016  . History of gastric ulcer 07/02/2016  . Asymptomatic microscopic hematuria 04/03/2016  . Benign prostatic hyperplasia 04/03/2016  . Renal cyst, left 04/03/2016  . 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 DDD (degenerative disc disease), lumbar 04/03/2016  . Colonic polyp 02/23/2016  . Duodenal ulcer 02/23/2016    Past Medical History:  Diagnosis Date  . Anemia   . Atypical chest pain    a. 09/2016 MV: EF 63%,  normal perfusion.  . Blood transfusion without reported diagnosis   . Diastolic dysfunction    a. 2015 Echo: EF >55%;  b. 09/2016 Echo: EF 55-60%, no rwma, Gr1 DD, triv AI/MR, mild TR, PASP 77mHg.  . Emphysema of lung ([place] 02/08/2016  . GERD (gastroesophageal  reflux disease)   . History of systemic steroid therapy 10/30/2017  . [place]umatoid arthritis [place]     Family History  Problem Relation Age of Onset  . Hypertension Mother   . Hypertension Father   . Diabetes Father   . Heart disease Father        stents  . Hyperlipidemia Sister   . Diabetes Sister   . Stroke Brother   . Diabetes Brother   . Heart disease Brother        stents  . Colon polyps Neg Hx   . Esophageal cancer Neg Hx   . Stomach cancer Neg Hx   . Rectal cancer Neg Hx   . Colon cancer Neg Hx    Past Surgical History:  Procedure Laterality Date  . ANTERIOR CERVICAL CORPECTOMY    . ANTERIOR CERVICAL CORPECTOMY  12/2014   [place] . CATARACT EXTRACTION     both eyes  . COLOSTOMY REVERSAL     had infection on buttocks and had to have a skin graft- had colostomy to help area stay clean and heal  . HERNIA REPAIR    . SPINE SURGERY     Social History   Social History Narrative   Retired. Lives alone.    Immunization History  Administered Date(s) Administered  . Influenza, High Dose Seasonal PF 07/23/2017  . Influenza,inj,Quad PF,6+ Mos 08/01/2017  . Influenza-Unspecified 10/30/2015, 09/13/2016, 07/30/2018  . Pneumococcal Conjugate-13 12/05/2015  . Pneumococcal Polysaccharide-23 04/23/2017  . Tdap 12/09/2015  . Zoster 12/09/2015     Objective: Vital Signs: BP 125/77 (BP Location: Left Arm, Patient Position: Sitting, Cuff Size: Small)   Pulse 90   Resp 12   Ht 5' 2"  (1.575 m)   Wt 154 lb 6.4 oz (70 kg)   BMI 28.24 kg/m    Physical Exam Vitals signs and nursing note reviewed.  Constitutional:      Appearance: He is well-developed.  HENT:     Head: Normocephalic and atraumatic.  Eyes:     Conjunctiva/sclera: Conjunctivae normal.     Pupils: Pupils are equal, round, and reactive to light.  Neck:     Musculoskeletal: Normal range of motion and neck supple.  Cardiovascular:     Rate and Rhythm: Normal rate and regular rhythm.      Heart sounds: Normal heart sounds.  Pulmonary:     Effort: Pulmonary effort is normal.     Breath sounds: Normal breath sounds.  Abdominal:     General: Bowel sounds are normal.     Palpations: Abdomen is soft.  Lymphadenopathy:     Cervical: No cervical adenopathy.  Skin:    General: Skin is warm and dry.     Capillary Refill: Capillary refill takes less than 2 seconds.  Neurological:     Mental Status: He is alert and oriented to person, place, and time.  Psychiatric:        Behavior: Behavior normal.      Musculoskeletal Exam: C-spine limited range of motion.  Thoracic kyphosis noted.  Limited range of motion of lumbar spine with some discomfort.  Shoulder abduction to 100 degrees bilaterally.  Elbow is in good range of  motion with no tenderness or synovitis.  He has limited range of motion of bilateral wrist joints.  He has extensor tenosynovitis of the left wrist joint.  Synovial thickening of bilateral wrist joints noted.  MCPs, PIPs, DIPs good range of motion no synovitis.  Hip joints, knee joints, ankle joints, MCPs, PIPs, DIPs good range of motion no synovitis.  No warmth or effusion bilateral knee joints.  No tenderness or swelling of ankle joints.  No tenderness over trochanteric bursa bilaterally. Left   CDAI Exam: CDAI Score: Not documented Patient Global Assessment: 7 (mm); Provider Global Assessment: Not documented Swollen: 2 ; Tender: 2  Joint Exam      Right  Left  Wrist  Swollen Tender  Swollen Tender     Investigation: No additional findings.  Imaging: No results found.  Recent Labs: Lab Results  Component Value Date   WBC 4.5 04/21/2019   HGB 14.2 04/21/2019   PLT 278 04/21/2019   NA 137 04/21/2019   K 4.3 04/21/2019   CL 103 04/21/2019   CO2 24 04/21/2019   GLUCOSE 95 04/21/2019   BUN 12 04/21/2019   CREATININE 1.31 (H) 04/21/2019   BILITOT 0.8 04/21/2019   ALKPHOS 69 08/03/2018   AST 25 04/21/2019   ALT 22 04/21/2019   PROT 7.3 04/21/2019    ALBUMIN 4.4 08/03/2018   CALCIUM 9.3 04/21/2019   GFRAA 60 04/21/2019   QFTBGOLDPLUS NEGATIVE 08/31/2018    Speciality Comments: No specialty comments available.  Procedures:  No procedures performed Allergies: Patient has no known allergies.   Assessment / Plan:     Visit Diagnoses: Rheumatoid arthritis of multiple sites with negative rheumatoid factor [facility]): He has chronic left wrist extensor tenosynovitis.  He has synovial thickening of bilateral wrist joints and tenderness bilaterally.  He has no other joint pain or joint swelling at this time.  He was started on Rinvoq 15 mg 1 tablet by mouth daily on 03/25/2019 and has continued injecting methotrexate 0.3 mL subcutaneously once weekly.  He is unable to increase the dose of methotrexate due to elevated creatinine and low GFR.  He has not noticed any improvement since switching from [place] to Rinvoq.  He will be started on a prednisone taper starting at 10 mg and he will taper by 2.5 mg every week.  He will follow-up in 1 month to see his response to prednisone and rinvoq.    High risk medication use - Rinvoq 15 mg po daily started on 03/25/19.  He is on MTX 0.3 ml sq once weekly and folic acid 1 mg po daily.  Inadequate response to [employer].  CBC and CMP were drawn on 04/21/2019 about 1 month after starting on Rinvoq.  He is returning to the office in 1 month for evaluation and we will obtain lab work at that time.  Standing orders are in place.  DDD (degenerative disc disease), cervical: He has limited range of motion with lateral rotation and flexion and extension.  He has no symptoms of radiculopathy at this time.  DDD (degenerative disc disease), lumbar: He has limited range of motion some discomfort.  No midline spinal tenderness at this time.  Elevated CK - CK was elevated at 237 on 04/21/19 but it is trending down.  ESR normal and anti--HMG CR antibody was negative in the past.  He is currently on Lipitor 10 mg by mouth daily.  She has  no increased muscle tenderness or muscle weakness at this time.  History of vitamin  D deficiency: He takes a calcium and vitamin D supplement daily.  Other medical conditions are listed as follows:  Asymptomatic microscopic hematuria  Dyslipidemia  History of BPH  History of COPD  History of gastric ulcer  History of colonic polyps   Orders: No orders of the defined types were placed in this encounter.  Meds ordered this encounter  Medications  . predniSONE (DELTASONE) 5 MG tablet    Sig: Take 2 tablets by mouth daily x1wk, 1.5 tablets by mouth daily x1wk, 1 tablet by mouth daily x1wk, 1/2 tablet by mouth daily x1wk.    Dispense:  35 tablet    Refill:  0    Face-to-face time spent with patient was 30 minutes. Greater than 50% of time was spent in counseling and coordination of care.  Follow-Up Instructions: Return in about 4 weeks (around 06/22/2019) for Rheumatoid arthritis, DDD.   [name] PA-C  I examined and evaluated the patient with [name] PA.  Patient still had some synovitis on his wrist joints.  He complains of a lot of discomfort.  Although the synovitis appears to be better on current combination of Rinvoq and methotrexate.  I will give him a gradual low-dose prednisone taper to see if this combination will work.  If he has an adequate response we may consider adding Plaquenil at the follow-up visit.  The plan of care was discussed as noted above.  Bo Merino, MD  Note - This record has been created using Editor, commissioning.  Chart creation errors have been sought, but may not always  have been located. Such creation errors do not reflect on  the standard of medical care.

## 2019-06-09 ENCOUNTER — Other Ambulatory Visit: Payer: Self-pay | Admitting: Rheumatology

## 2019-06-09 DIAGNOSIS — M0609 Rheumatoid arthritis without rheumatoid factor, multiple sites: Secondary | ICD-10-CM

## 2019-06-09 NOTE — Telephone Encounter (Signed)
Last Visit: 05/25/2019 Next Visit: 06/24/2019 Labs: 04/21/2019 Labs are stable. Continue current Tt.  Okay to refill per Dr. Estanislado Pandy.

## 2019-06-10 NOTE — Progress Notes (Signed)
Office Visit Note  Patient: Tony Mcmillan             Date of Birth: 1941-05-27           MRN: [id number]             PCP: Girtha Rm, NP-C Referring: Girtha Rm, NP-C Visit Date: 06/24/2019 Occupation: @GUAROCC @  Subjective:  Pain in both wrists.  History of Present Illness: Tony Mcmillan is a 78 y.o. male with history of rheumatoid arthritis and osteoarthritis overlap.  He states he has been having pain and discomfort is in bilateral wrists.  He has been taking his medications on regular basis.  He states he had a flare a week ago with generalized pain.  He finished the prednisone taper yesterday.  Activities of Daily Living:  Patient reports morning stiffness for 2 hours.  Patient Reports nocturnal pain.  Difficulty dressing/grooming: Reports Difficulty climbing stairs: Reports Difficulty getting out of chair: Reports Difficulty using hands for taps, buttons, cutlery, and/or writing: Reports  Review of Systems  Constitutional: Negative for fatigue and night sweats.  HENT: Negative for mouth sores, mouth dryness and nose dryness.   Eyes: Negative for redness and dryness.  Respiratory: Positive for shortness of breath. Negative for difficulty breathing.   Cardiovascular: Negative for chest pain, palpitations, hypertension, irregular heartbeat and swelling in legs/feet.  Gastrointestinal: Positive for constipation. Negative for diarrhea.  Endocrine: Negative for increased urination.  Musculoskeletal: Positive for arthralgias, joint pain, joint swelling, myalgias, morning stiffness and myalgias. Negative for muscle weakness and muscle tenderness.  Skin: Negative for color change, rash, hair loss, nodules/bumps, skin tightness, ulcers and sensitivity to sunlight.  Allergic/Immunologic: Negative for susceptible to infections.  Neurological: Negative for dizziness, fainting, memory loss, night sweats and weakness ( ).  Hematological: Negative for swollen glands.   Psychiatric/Behavioral: Negative for depressed mood and sleep disturbance. The patient is not nervous/anxious.     PMFS History:  Patient Active Problem List   Diagnosis Date Noted  . Bilateral carotid bruits 02/09/2019  . Chest pain 02/08/2019  . Intermittent palpitations 01/29/2019  . Prediabetes 10/29/2018  . History of systemic steroid therapy 10/30/2017  . Feeling of chest tightness, unclear cause, ? due to RA 02/21/2017  . Pulmonary emphysema ([place]) 12/18/2016  . Dyspnea and respiratory abnormality 11/25/2016  . 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[place]) 11/04/2016  . High risk medication use 11/04/2016  . 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 Vitamin D deficiency 11/04/2016  . 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 History of anemia 07/02/2016  . History of colonic polyps 07/02/2016  . History of gastric ulcer 07/02/2016  . Asymptomatic microscopic hematuria 04/03/2016  . Benign prostatic hyperplasia 04/03/2016  . Renal cyst, left 04/03/2016  . 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 DDD (degenerative disc disease), lumbar 04/03/2016  . Colonic polyp 02/23/2016  . Duodenal ulcer 02/23/2016    Past Medical History:  Diagnosis Date  . Anemia   . Atypical chest pain    a. 09/2016 MV: EF 63%, normal perfusion.  . Blood transfusion without reported diagnosis   . Diastolic dysfunction    a. 2015 Echo: EF >55%;  b. 09/2016 Echo: EF 55-60%, no rwma, Gr1 DD, triv AI/MR, mild TR, PASP 75mHg.  . [place]sema of lung ([place] 02/08/2016  . GERD (gastroesophageal reflux disease)   . History of systemic steroid therapy 10/30/2017  . Rheumatoid arthritis ([place]     Family History  Problem Relation Age of Onset  .  Hypertension Mother   . Hypertension Father   . Diabetes Father   . Heart disease Father        stents  . Hyperlipidemia Sister   . Diabetes Sister   . Stroke Brother   . Diabetes Brother   . Heart disease Brother        stents  . Colon  polyps Neg Hx   . Esophageal cancer Neg Hx   . Stomach cancer Neg Hx   . Rectal cancer Neg Hx   . Colon cancer Neg Hx    Past Surgical History:  Procedure Laterality Date  . ANTERIOR CERVICAL CORPECTOMY    . ANTERIOR CERVICAL CORPECTOMY  12/2014   for infection. this was in [place]  . CATARACT EXTRACTION     both eyes  . COLOSTOMY REVERSAL     had infection on buttocks and had to have a skin graft- had colostomy to help area stay clean and heal  . HERNIA REPAIR    . SPINE SURGERY     Social History   Social History Narrative   Retired. Lives alone.    Immunization History  Administered Date(s) Administered  . Influenza, High Dose Seasonal PF 07/23/2017  . Influenza,inj,Quad PF,6+ Mos 08/01/2017  . Influenza-Unspecified 10/30/2015, 09/13/2016, 07/30/2018  . Pneumococcal Conjugate-13 12/05/2015  . Pneumococcal Polysaccharide-23 04/23/2017  . Tdap 12/09/2015  . Zoster 12/09/2015     Objective: Vital Signs: BP 112/77 (BP Location: Left Arm, Patient Position: Sitting, Cuff Size: Small)   Pulse (!) 101   Resp 12   Ht 5' 2"  (1.575 m)   Wt 149 lb 12.8 oz (67.9 kg)   BMI 27.40 kg/m    Physical Exam Vitals signs and nursing note reviewed.  Constitutional:      Appearance: He is well-developed.  HENT:     Head: Normocephalic and atraumatic.  Eyes:     Conjunctiva/sclera: Conjunctivae normal.     Pupils: Pupils are equal, round, and reactive to light.  Neck:     Musculoskeletal: Normal range of motion and neck supple.  Cardiovascular:     Rate and Rhythm: Normal rate and regular rhythm.     Heart sounds: Normal heart sounds.  Pulmonary:     Effort: Pulmonary effort is normal.     Breath sounds: Normal breath sounds.  Abdominal:     General: Bowel sounds are normal.     Palpations: Abdomen is soft.  Skin:    General: Skin is warm and dry.     Capillary Refill: Capillary refill takes less than 2 seconds.  Neurological:     Mental Status: He is alert and oriented to  person, place, and time.  Psychiatric:        Behavior: Behavior normal.      Musculoskeletal Exam: He has limited range of motion of the cervical and lumbar spine.  Shoulder joint abduction was limited bilaterally.  He had limited range of motion of elbows and wrist joints.  Synovitis was noted over left wrist joint.  MCP thickening with no synovitis was noted.  There was no swelling over knee joints or ankle joints.  CDAI Exam: CDAI Score: 3  Patient Global: 6 mm; Provider Global: 4 mm Swollen: 1 ; Tender: 1  Joint Exam      Right  Left  Wrist     Swollen Tender     Investigation: No additional findings.  Imaging: No results found.  Recent Labs: Lab Results  Component Value Date   WBC  4.5 04/21/2019   HGB 14.2 04/21/2019   PLT 278 04/21/2019   NA 137 04/21/2019   K 4.3 04/21/2019   CL 103 04/21/2019   CO2 24 04/21/2019   GLUCOSE 95 04/21/2019   BUN 12 04/21/2019   CREATININE 1.31 (H) 04/21/2019   BILITOT 0.8 04/21/2019   ALKPHOS 69 08/03/2018   AST 25 04/21/2019   ALT 22 04/21/2019   PROT 7.3 04/21/2019   ALBUMIN 4.4 08/03/2018   CALCIUM 9.3 04/21/2019   GFRAA 60 04/21/2019   QFTBGOLDPLUS NEGATIVE 08/31/2018    Speciality Comments: No specialty comments available.  Procedures:  No procedures performed Allergies: Patient has no known allergies.   Assessment / Plan:     Visit Diagnoses: Rheumatoid arthritis of multiple sites with negative rheumatoid factor ([place]) -patient states he continues to have pain and swelling in his joints despite being on Rinvoq and methotrexate combination.  He had recent prednisone taper.  I have noticed remarkable improvement in his arthritis although he still have some synovitis in his left wrist joint.  We will check ESR today.  High risk medication use -Rinvoq 15 mg 1 tablet daily, methotrexate 0.3 mL every 7 days and folic acid 1 mg 2 tablets daily.  Last TB gold negative on 08/31/2018 and will monitor yearly.  Last lipid panel  within normal limits except for elevated triglycerides on 08/03/2018 and will monitor yearly.  He is on Lipitor 10 mg daily managed by his PCP.  Most recent CBC/CMP stable on 04/21/2019.  Due for CBC/CMP in the July will monitor every 3 months.  Standing orders are in place.  He received the flu vaccine in August and previously Prevnar 13, Pneumovax 23, and Zostavax vaccines.  Recommend Shingrix vaccines as indicated.Inadequate response to Morrie Sheldon. - Plan: CBC and CMP today.  DDD (degenerative disc disease), cervical -he has limited range of motion.  DDD (degenerative disc disease), lumbar -patient has limited range of motion.  Elevated CK - CK was elevated at 237 on 04/21/19 but it is trending down.  ESR normal and anti--HMG CR antibody was negative in the past.  I will check at the next visit.  History of vitamin D deficiency -his vitamin D level was corrected here in August 2019.  He has been taking vitamin D supplement.  Other medical problems are listed as follows:  History of colonic polyps  Asymptomatic microscopic hematuria  History of COPD  History of gastric ulcer   Dyslipidemia   History of BPH   Orders: Orders Placed This Encounter  Procedures  . CBC with Differential/Platelet  . COMPLETE METABOLIC PANEL WITH GFR  . Sedimentation rate   No orders of the defined types were placed in this encounter.   Face-to-face time spent with patient was  minutes. Greater than 50% of time was spent in counseling and coordination of care.  Follow-Up Instructions: Return in about 3 months (around 09/24/2019) for Rheumatoid arthritis.   Bo Merino, MD  Note - This record has been created using Editor, commissioning.  Chart creation errors have been sought, but may not always  have been located. Such creation errors do not reflect on  the standard of medical care.

## 2019-06-24 ENCOUNTER — Other Ambulatory Visit: Payer: Self-pay

## 2019-06-24 ENCOUNTER — Encounter: Payer: Self-pay | Admitting: Rheumatology

## 2019-06-24 ENCOUNTER — Ambulatory Visit (INDEPENDENT_AMBULATORY_CARE_PROVIDER_SITE_OTHER): Payer: Medicare Other | Admitting: Rheumatology

## 2019-06-24 VITALS — BP 112/77 | HR 101 | Resp 12 | Ht 62.0 in | Wt 149.8 lb

## 2019-06-24 DIAGNOSIS — M5030 Other cervical disc degeneration, unspecified cervical region: Secondary | ICD-10-CM

## 2019-06-24 DIAGNOSIS — R3121 Asymptomatic microscopic hematuria: Secondary | ICD-10-CM

## 2019-06-24 DIAGNOSIS — Z79899 Other long term (current) drug therapy: Secondary | ICD-10-CM | POA: Diagnosis not present

## 2019-06-24 DIAGNOSIS — Z8709 Personal history of other diseases of the respiratory system: Secondary | ICD-10-CM | POA: Diagnosis not present

## 2019-06-24 DIAGNOSIS — Z8711 Personal history of peptic ulcer disease: Secondary | ICD-10-CM

## 2019-06-24 DIAGNOSIS — Z8719 Personal history of other diseases of the digestive system: Secondary | ICD-10-CM | POA: Diagnosis not present

## 2019-06-24 DIAGNOSIS — Z8639 Personal history of other endocrine, nutritional and metabolic disease: Secondary | ICD-10-CM | POA: Diagnosis not present

## 2019-06-24 DIAGNOSIS — I2510 Atherosclerotic heart disease of native coronary artery without angina pectoris: Secondary | ICD-10-CM

## 2019-06-24 DIAGNOSIS — M0609 Rheumatoid arthritis without rheumatoid factor, multiple sites: Secondary | ICD-10-CM | POA: Diagnosis not present

## 2019-06-24 DIAGNOSIS — R748 Abnormal levels of other serum enzymes: Secondary | ICD-10-CM

## 2019-06-24 DIAGNOSIS — E785 Hyperlipidemia, unspecified: Secondary | ICD-10-CM | POA: Diagnosis not present

## 2019-06-24 DIAGNOSIS — Z87438 Personal history of other diseases of male genital organs: Secondary | ICD-10-CM

## 2019-06-24 DIAGNOSIS — M5136 Other intervertebral disc degeneration, lumbar region: Secondary | ICD-10-CM

## 2019-06-24 DIAGNOSIS — Z86010 Personal history of colonic polyps: Secondary | ICD-10-CM | POA: Diagnosis not present

## 2019-06-24 NOTE — Patient Instructions (Signed)
Standing Labs We placed an order today for your standing lab work.    Please come back and get your standing labs in October and every 3 months   We have open lab daily Monday through Thursday from 8:30-12:30 PM and 1:30-4:30 PM and Friday from 8:30-12:30 PM and 1:30 -4:00 PM at the office of Dr.  .   You may experience shorter wait times on Monday and Friday afternoons. The office is located at [address], [address], [place], [place] [address] No appointment is necessary.   Labs are drawn by [facility].  You may receive a bill from [facility] for your lab work.  If you wish to have your labs drawn at another location, please call the office 24 hours in advance to send orders.  If you have any questions regarding directions or hours of operation,  please call [phone].   Just as a reminder please drink plenty of water prior to coming for your lab work. Thanks!   

## 2019-06-25 LAB — COMPLETE METABOLIC PANEL WITH GFR
AG Ratio: 1.6 (calc) (ref 1.0–2.5)
ALT: 20 U/L (ref 9–46)
AST: 23 U/L (ref 10–35)
Albumin: 4.2 g/dL (ref 3.6–5.1)
Alkaline phosphatase (APISO): 49 U/L (ref 35–144)
BUN/Creatinine Ratio: 12 (calc) (ref 6–22)
BUN: 16 mg/dL (ref 7–25)
CO2: 25 mmol/L (ref 20–32)
Calcium: 9.3 mg/dL (ref 8.6–10.3)
Chloride: 103 mmol/L (ref 98–110)
Creat: 1.35 mg/dL — ABNORMAL HIGH (ref 0.70–1.18)
GFR, Est African American: 58 mL/min/{1.73_m2} — ABNORMAL LOW (ref >=60)
GFR, Est Non African American: 50 mL/min/{1.73_m2} — ABNORMAL LOW (ref >=60)
Globulin: 2.6 g/dL (calc) (ref 1.9–3.7)
Glucose, Bld: 104 mg/dL — ABNORMAL HIGH (ref 65–99)
Potassium: 4.6 mmol/L (ref 3.5–5.3)
Sodium: 137 mmol/L (ref 135–146)
Total Bilirubin: 1.6 mg/dL — ABNORMAL HIGH (ref 0.2–1.2)
Total Protein: 6.8 g/dL (ref 6.1–8.1)

## 2019-06-25 LAB — CBC WITH DIFFERENTIAL/PLATELET
Absolute Monocytes: 530 cells/uL (ref 200–950)
Basophils Absolute: 10 cells/uL (ref 0–200)
Basophils Relative: 0.2 %
Eosinophils Absolute: 21 cells/uL (ref 15–500)
Eosinophils Relative: 0.4 %
HCT: 44.3 % (ref 38.5–50.0)
Hemoglobin: 15.2 g/dL (ref 13.2–17.1)
Lymphs Abs: 338 cells/uL — ABNORMAL LOW (ref 850–3900)
MCH: 29.7 pg (ref 27.0–33.0)
MCHC: 34.3 g/dL (ref 32.0–36.0)
MCV: 86.5 fL (ref 80.0–100.0)
MPV: 9.5 fL (ref 7.5–12.5)
Monocytes Relative: 10.2 %
Neutro Abs: 4300 cells/uL (ref 1500–7800)
Neutrophils Relative %: 82.7 %
Platelets: 235 10*3/uL (ref 140–400)
RBC: 5.12 10*6/uL (ref 4.20–5.80)
RDW: 18.2 % — ABNORMAL HIGH (ref 11.0–15.0)
Total Lymphocyte: 6.5 %
WBC: 5.2 10*3/uL (ref 3.8–10.8)

## 2019-06-25 LAB — SEDIMENTATION RATE: Sed Rate: 6 mm/h (ref 0–20)

## 2019-06-28 ENCOUNTER — Other Ambulatory Visit: Payer: Self-pay | Admitting: Rheumatology

## 2019-06-28 NOTE — Telephone Encounter (Signed)
Last Visit: 06/24/19  Next Visit: 09/23/19 Labs: 06/24/19 stable   Okay to refill per Dr. Estanislado Pandy

## 2019-06-28 NOTE — Progress Notes (Signed)
stable °

## 2019-07-20 ENCOUNTER — Telehealth: Payer: Self-pay | Admitting: Internal Medicine

## 2019-07-20 MED ORDER — BEVESPI AEROSPHERE 9-4.8 MCG/ACT IN AERO: 2.0000 | INHALATION_SPRAY | Freq: Two times a day (BID) | RESPIRATORY_TRACT | 3 refills | Status: DC

## 2019-07-20 NOTE — Telephone Encounter (Signed)
Called and spoke with pt letting him know that I could refill his inhaler but he was due for an appt. Pt verbalized understanding and did schedule a f/u appt. Rx sent to pt's pharmacy. Nothing further needed.

## 2019-07-22 ENCOUNTER — Ambulatory Visit: Payer: Medicare Other | Admitting: Physician Assistant

## 2019-07-25 ENCOUNTER — Other Ambulatory Visit: Payer: Self-pay | Admitting: Rheumatology

## 2019-07-25 DIAGNOSIS — M0609 Rheumatoid arthritis without rheumatoid factor, multiple sites: Secondary | ICD-10-CM

## 2019-07-26 NOTE — Telephone Encounter (Signed)
Last Visit: 06/24/2019 Next Visit: 09/23/2019 Labs: 06/24/2019 stable   Okay to refill per Dr. Estanislado Pandy.

## 2019-07-30 ENCOUNTER — Telehealth: Payer: Self-pay | Admitting: Family Medicine

## 2019-07-30 NOTE — Telephone Encounter (Signed)
This medication is being requested too soon. It was last filled in February 2020 #90 with 3 additional refills.

## 2019-07-30 NOTE — Telephone Encounter (Signed)
Recv'd fax request for Atorvastatin 10mg  #90

## 2019-07-31 NOTE — Telephone Encounter (Signed)
This request was from [facility] mail order, the original Rx was sent to local pharmacy

## 2019-08-02 NOTE — Telephone Encounter (Signed)
Will check with patient to see if he prefer mail order.

## 2019-08-02 NOTE — Telephone Encounter (Signed)
Spoke to patient and he stated his medication can stay at the local pharmacy and he will call and inform is if he wants to change pharmacy in the future.

## 2019-08-03 ENCOUNTER — Encounter: Payer: Self-pay | Admitting: Primary Care

## 2019-08-03 ENCOUNTER — Ambulatory Visit (INDEPENDENT_AMBULATORY_CARE_PROVIDER_SITE_OTHER): Payer: Medicare Other | Admitting: Primary Care

## 2019-08-03 ENCOUNTER — Other Ambulatory Visit: Payer: Self-pay

## 2019-08-03 ENCOUNTER — Ambulatory Visit (INDEPENDENT_AMBULATORY_CARE_PROVIDER_SITE_OTHER): Payer: Medicare Other

## 2019-08-03 VITALS — BP 120/72 | HR 86 | Temp 98.4°F | Ht 62.5 in | Wt 149.4 lb

## 2019-08-03 DIAGNOSIS — J439 Emphysema, unspecified: Secondary | ICD-10-CM

## 2019-08-03 DIAGNOSIS — M069 Rheumatoid arthritis, unspecified: Secondary | ICD-10-CM

## 2019-08-03 DIAGNOSIS — R05 Cough: Secondary | ICD-10-CM | POA: Diagnosis not present

## 2019-08-03 MED ORDER — BEVESPI AEROSPHERE 9-4.8 MCG/ACT IN AERO: 2.0000 | INHALATION_SPRAY | Freq: Two times a day (BID) | RESPIRATORY_TRACT | 3 refills | Status: DC

## 2019-08-03 NOTE — Addendum Note (Signed)
Addended by: Valerie Salts on: 08/03/2019 12:02 PM   Modules accepted: Orders

## 2019-08-03 NOTE — Progress Notes (Signed)
@Patient  ID: Tony Mcmillan, male    DOB: [birth year], 78 y.o.   MRN: [id number]  Chief Complaint  Patient presents with  . Follow-up    39yr f/u for emphysema. States his breathing has been ok since last visit. No increased in SOB and coughing.     Referring provider: Girtha Rm, NP-C  HPI: 78 year old male, former smoker. PMH significant for pulmonary emphysema, nocturnal hypoxia, rheumatoid arthritis. Patient of Dr. Chase Caller, last seen on 05/04/18. ONO rechecked in May 2019 and he only spent 2.57min <88%, nocturnal oxygen discontinued.  Continues Bevespi twice daily. HRCT in 2017 showed no evidence of ILD.   08/03/2019 Patient presents today for annual follow-up. Breathing is stable, continues to have right side tightness which he states he has had for years. He has a dry cough, variable.  Saw rheumatology in July. Notes reviewed, continues to have pain and swelling despite being on Rinvoq and methotrexate. Completed prednisone taper recently with improvement. Denies chest pain, shortness of breath or wheezing. CAT score is 5.  No Known Allergies  Immunization History  Administered Date(s) Administered  . Influenza, High Dose Seasonal PF 07/23/2017  . Influenza,inj,Quad PF,6+ Mos 08/01/2017  . Influenza-Unspecified 10/30/2015, 09/13/2016, 07/30/2018  . Pneumococcal Conjugate-13 12/05/2015  . Pneumococcal Polysaccharide-23 04/23/2017  . Tdap 12/09/2015  . Zoster 12/09/2015    Past Medical History:  Diagnosis Date  . Anemia   . Atypical chest pain    a. 09/2016 MV: EF 63%, normal perfusion.  . Blood transfusion without reported diagnosis   . Diastolic dysfunction    a. 2015 Echo: EF >55%;  b. 09/2016 Echo: EF 55-60%, no rwma, Gr1 DD, triv AI/MR, mild TR, PASP 57mmHg.  . Emphysema of lung ([place]) 02/08/2016  . GERD (gastroesophageal reflux disease)   . History of systemic steroid therapy 10/30/2017  . Rheumatoid arthritis ([place])     Tobacco History: Social History   Tobacco  Use  Smoking Status Former Smoker  . Packs/day: 1.00  . Years: 17.00  . Pack years: 17.00  . Types: Cigarettes  . Quit date: 12/23/1993  . Years since quitting: 25.6  Smokeless Tobacco Never Used   Counseling given: Not Answered   Outpatient Medications Prior to Visit  Medication Sig Dispense Refill  . atorvastatin (LIPITOR) 10 MG tablet Take 1 tablet (10 mg total) by mouth daily. 90 tablet 3  . Calcium Carb-Cholecalciferol (CALCIUM-VITAMIN D3) 600-400 MG-UNIT TABS Take by mouth.    . folic acid (FOLVITE) 1 MG tablet TAKE 2 TABLETS (2 MG TOTAL) BY MOUTH DAILY. 180 tablet 3  . Glycopyrrolate-Formoterol (BEVESPI AEROSPHERE) 9-4.8 MCG/ACT AERO Inhale 2 puffs into the lungs 2 (two) times daily. 10.7 g 3  . methotrexate 50 MG/2ML injection INJECT 0.3 ML (7.5 MG) INTO THE SKIN ONCE A WEEK (EXPIRES IN 28 DAYS) 8 mL 0  . MYRBETRIQ 50 MG TB24 tablet     . RINVOQ 15 MG TB24 TAKE 1 TABLET (15 MG) DAILY 90 tablet 0  . TUBERCULIN SYR 1CC/27GX1/2" (B-D TB SYRINGE 1CC/27GX1/2") 27G X 1/2" 1 ML MISC USE 1 SYRINGE TO INJECT UNDER THE SKIN ONCE A WEEK, TO BE USED WITH WEEKLY METHOTREXATE INJECTIONS 12 each 3  . predniSONE (DELTASONE) 5 MG tablet Take 2 tablets by mouth daily x1wk, 1.5 tablets by mouth daily x1wk, 1 tablet by mouth daily x1wk, 1/2 tablet by mouth daily x1wk. 35 tablet 0   No facility-administered medications prior to visit.     Review of Systems  Review  of Systems  Constitutional: Negative.   HENT: Negative.   Respiratory: Positive for cough and chest tightness. Negative for shortness of breath and wheezing.   Cardiovascular: Negative.   Musculoskeletal: Positive for arthralgias.    Physical Exam  BP 120/72 (BP Location: Left Arm, Patient Position: Sitting, Cuff Size: Normal)   Pulse 86   Temp 98.4 F (36.9 C) (Oral)   Ht 5' 2.5" (1.588 m)   Wt 149 lb 6.4 oz (67.8 kg)   SpO2 96%   BMI 26.89 kg/m  Physical Exam Constitutional:      Appearance: Normal appearance.  Neck:      Musculoskeletal: Normal range of motion and neck supple.  Cardiovascular:     Rate and Rhythm: Normal rate and regular rhythm.  Pulmonary:     Effort: Pulmonary effort is normal.     Breath sounds: Normal breath sounds.     Comments: No audible crackles  Musculoskeletal:     Comments: arthritic changes to hands   Skin:    General: Skin is warm and dry.  Neurological:     General: No focal deficit present.     Mental Status: He is alert and oriented to person, place, and time. Mental status is at baseline.  Psychiatric:        Mood and Affect: Mood normal.        Behavior: Behavior normal.        Thought Content: Thought content normal.        Judgment: Judgment normal.     Comments: Flat affect      Lab Results:  CBC    Component Value Date/Time   WBC 5.2 06/24/2019 1331   RBC 5.12 06/24/2019 1331   HGB 15.2 06/24/2019 1331   HGB 14.9 12/02/2018 1520   HCT 44.3 06/24/2019 1331   HCT 44.1 12/02/2018 1520   PLT 235 06/24/2019 1331   PLT 374 12/02/2018 1520   MCV 86.5 06/24/2019 1331   MCV 86 12/02/2018 1520   MCH 29.7 06/24/2019 1331   MCHC 34.3 06/24/2019 1331   RDW 18.2 (H) 06/24/2019 1331   RDW 17.5 (H) 12/02/2018 1520   LYMPHSABS 338 (L) 06/24/2019 1331   LYMPHSABS 0.6 (L) 12/02/2018 1520   MONOABS 392 07/14/2017 1123   EOSABS 21 06/24/2019 1331   EOSABS 0.0 12/02/2018 1520   BASOSABS 10 06/24/2019 1331   BASOSABS 0.0 12/02/2018 1520    BMET    Component Value Date/Time   NA 137 06/24/2019 1331   NA 137 08/03/2018 1509   K 4.6 06/24/2019 1331   CL 103 06/24/2019 1331   CO2 25 06/24/2019 1331   GLUCOSE 104 (H) 06/24/2019 1331   BUN 16 06/24/2019 1331   BUN 11 08/03/2018 1509   CREATININE 1.35 (H) 06/24/2019 1331   CALCIUM 9.3 06/24/2019 1331   GFRNONAA 50 (L) 06/24/2019 1331   GFRAA 58 (L) 06/24/2019 1331    BNP No results found for: BNP  ProBNP No results found for: PROBNP  Imaging: No results found.   Assessment & Plan:    Pulmonary emphysema (HCC) - Stable interval - Continue Bevespi 2 puffs twice daily - CXR today d/t dry cough  - Needs spirometry with DLCO - CAT score 5  Rheumatoid arthritis involving multiple sites [facility]) - Following with Rheumatology/ Dr. Estanislado Pandy last seen in July - Continues Methotrexate and Rinvoq  - Lung exam normal today, checking CXR - HRCT in 2017 showed no evidence of ILD - Will check spirometry with  DLCO    Martyn Ehrich, NP 08/03/2019

## 2019-08-03 NOTE — Assessment & Plan Note (Addendum)
-   Stable interval - Continue Bevespi 2 puffs twice daily - CXR today d/t dry cough  - Needs spirometry with DLCO - CAT score 5

## 2019-08-03 NOTE — Assessment & Plan Note (Signed)
-   Following with Rheumatology/ Dr. Estanislado Pandy last seen in July - Continues Methotrexate and Rinvoq  - Lung exam normal today, checking CXR - HRCT in 2017 showed no evidence of ILD - Will check spirometry with DLCO

## 2019-08-03 NOTE — Patient Instructions (Addendum)
  CXR today   Needs Spirometry with DLCO   Continue Bevespri two puffs twice daily   Follow up in 6-9 months with Dr. Chase Caller or sooner if needed

## 2019-08-04 NOTE — Progress Notes (Signed)
Please let patient know CXR showed no acute cardiopulmonary findings. Stable elevation right hemidiaphragm dating back to 2017.

## 2019-08-05 DIAGNOSIS — Z23 Encounter for immunization: Secondary | ICD-10-CM | POA: Diagnosis not present

## 2019-09-01 ENCOUNTER — Other Ambulatory Visit: Payer: Self-pay

## 2019-09-01 DIAGNOSIS — Z79899 Other long term (current) drug therapy: Secondary | ICD-10-CM

## 2019-09-02 LAB — CBC WITH DIFFERENTIAL/PLATELET
Absolute Monocytes: 497 {cells}/uL (ref 200–950)
Basophils Absolute: 22 {cells}/uL (ref 0–200)
Basophils Relative: 0.4 %
Eosinophils Absolute: 38 {cells}/uL (ref 15–500)
Eosinophils Relative: 0.7 %
HCT: 40.9 % (ref 38.5–50.0)
Hemoglobin: 13.7 g/dL (ref 13.2–17.1)
Lymphs Abs: 443 {cells}/uL — ABNORMAL LOW (ref 850–3900)
MCH: 29.9 pg (ref 27.0–33.0)
MCHC: 33.5 g/dL (ref 32.0–36.0)
MCV: 89.3 fL (ref 80.0–100.0)
MPV: 9.9 fL (ref 7.5–12.5)
Monocytes Relative: 9.2 %
Neutro Abs: 4401 {cells}/uL (ref 1500–7800)
Neutrophils Relative %: 81.5 %
Platelets: 270 Thousand/uL (ref 140–400)
RBC: 4.58 Million/uL (ref 4.20–5.80)
RDW: 16 % — ABNORMAL HIGH (ref 11.0–15.0)
Total Lymphocyte: 8.2 %
WBC: 5.4 Thousand/uL (ref 3.8–10.8)

## 2019-09-02 LAB — COMPLETE METABOLIC PANEL WITH GFR
AG Ratio: 1.4 (calc) (ref 1.0–2.5)
ALT: 18 U/L (ref 9–46)
AST: 23 U/L (ref 10–35)
Albumin: 4.1 g/dL (ref 3.6–5.1)
Alkaline phosphatase (APISO): 40 U/L (ref 35–144)
BUN/Creatinine Ratio: 11 (calc) (ref 6–22)
BUN: 14 mg/dL (ref 7–25)
CO2: 21 mmol/L (ref 20–32)
Calcium: 9.3 mg/dL (ref 8.6–10.3)
Chloride: 105 mmol/L (ref 98–110)
Creat: 1.26 mg/dL — ABNORMAL HIGH (ref 0.70–1.18)
GFR, Est African American: 63 mL/min/{1.73_m2} (ref >=60)
GFR, Est Non African American: 55 mL/min/{1.73_m2} — ABNORMAL LOW (ref >=60)
Globulin: 3 g/dL (calc) (ref 1.9–3.7)
Glucose, Bld: 91 mg/dL (ref 65–99)
Potassium: 4.4 mmol/L (ref 3.5–5.3)
Sodium: 137 mmol/L (ref 135–146)
Total Bilirubin: 1.3 mg/dL — ABNORMAL HIGH (ref 0.2–1.2)
Total Protein: 7.1 g/dL (ref 6.1–8.1)

## 2019-09-02 NOTE — Progress Notes (Signed)
stable °

## 2019-09-09 NOTE — Progress Notes (Signed)
Office Visit Note  Patient: Tony Mcmillan             Date of Birth: 11/27/1941           MRN: [id number]             PCP: Girtha Rm, NP-C Referring: Girtha Rm, NP-C Visit Date: 09/23/2019 Occupation: @GUAROCC @  Subjective:  Left wrist pain   History of Present Illness: Tony Mcmillan is a 78 y.o. male with history of seronegative rheumatoid arthritis and DDD.  He is taking Rinvoq 15 mg 1 tablet daily, Methotrexate 0.3 ml sq once weekly, and folic acid 1 mg po daily.  He denies missing any doses recently.  He continues to have persistent pain and inflammation in bilateral wrist joints.  He states the pain is worse in the left wrist.  He had a cortisone injection in the left wrist about 1 year ago which provided temporary relief.  He does not want a cortisone injection or prednisone at this he denies any other increased joint pain or joint swelling at this time.  He continues to walk with a cane to assist with ambulation.   Activities of Daily Living:  Patient reports morning stiffness for 20 minutes.   Patient Denies nocturnal pain.  Difficulty dressing/grooming: Denies Difficulty climbing stairs: Reports Difficulty getting out of chair: Reports Difficulty using hands for taps, buttons, cutlery, and/or writing: Denies  Review of Systems  Constitutional: Negative for fatigue and night sweats.  HENT: Negative for mouth sores, mouth dryness and nose dryness.   Eyes: Negative for redness and dryness.  Respiratory: Negative for cough, hemoptysis, shortness of breath and difficulty breathing.   Cardiovascular: Negative for chest pain, palpitations, hypertension, irregular heartbeat and swelling in legs/feet.  Gastrointestinal: Positive for constipation. Negative for blood in stool and diarrhea.  Endocrine: Negative for increased urination.  Genitourinary: Negative for painful urination.  Musculoskeletal: Positive for arthralgias, joint pain, joint swelling and morning  stiffness. Negative for myalgias, muscle weakness, muscle tenderness and myalgias.  Skin: Negative for color change, rash, hair loss, nodules/bumps, skin tightness, ulcers and sensitivity to sunlight.  Allergic/Immunologic: Negative for susceptible to infections.  Neurological: Negative for dizziness, fainting, memory loss, night sweats and weakness.  Hematological: Negative for swollen glands.  Psychiatric/Behavioral: Negative for depressed mood and sleep disturbance. The patient is not nervous/anxious.     PMFS History:  Patient Active Problem List   Diagnosis Date Noted  . Bilateral carotid bruits 02/09/2019  . Chest pain 02/08/2019  . Intermittent palpitations 01/29/2019  . Prediabetes 10/29/2018  . History of systemic steroid therapy 10/30/2017  . Feeling of chest tightness, unclear cause, ? due to RA 02/21/2017  . Pulmonary emphysema ([place]) 12/18/2016  . Dyspnea and respiratory abnormality 11/25/2016  . 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[place]) 11/04/2016  . High risk medication use 11/04/2016  . 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 Vitamin D deficiency 11/04/2016  . 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 History of anemia 07/02/2016  . History of colonic polyps 07/02/2016  . History of gastric ulcer 07/02/2016  . Asymptomatic microscopic hematuria 04/03/2016  . Benign prostatic hyperplasia 04/03/2016  . Renal cyst, left 04/03/2016  . 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 DDD (degenerative disc disease), lumbar 04/03/2016  . Colonic polyp 02/23/2016  . Duodenal ulcer 02/23/2016    Past Medical History:  Diagnosis Date  . Anemia   . Atypical chest pain    a. 09/2016 MV: EF 63%, normal perfusion.  Marland Kitchen  Blood transfusion without reported diagnosis   . Diastolic dysfunction    a. 2015 Echo: EF >55%;  b. 09/2016 Echo: EF 55-60%, no rwma, Gr1 DD, triv AI/MR, mild TR, PASP 6mmHg.  . Emphysema of lung ([place]) 02/08/2016  . GERD (gastroesophageal  reflux disease)   . History of systemic steroid therapy 10/30/2017  . Rheumatoid arthritis ([place])     Family History  Problem Relation Age of Onset  . Hypertension Mother   . Hypertension Father   . Diabetes Father   . Heart disease Father        stents  . Hyperlipidemia Sister   . Diabetes Sister   . Stroke Brother   . Diabetes Brother   . Heart disease Brother        stents  . Colon polyps Neg Hx   . Esophageal cancer Neg Hx   . Stomach cancer Neg Hx   . Rectal cancer Neg Hx   . Colon cancer Neg Hx    Past Surgical History:  Procedure Laterality Date  . ANTERIOR CERVICAL CORPECTOMY    . ANTERIOR CERVICAL CORPECTOMY  12/2014   for infection. this was in [place]  . CATARACT EXTRACTION     both eyes  . COLOSTOMY REVERSAL     had infection on buttocks and had to have a skin graft- had colostomy to help area stay clean and heal  . HERNIA REPAIR    . SPINE SURGERY     Social History   Social History Narrative   Retired. Lives alone.    Immunization History  Administered Date(s) Administered  . Influenza, High Dose Seasonal PF 07/23/2017, 08/05/2019  . Influenza,inj,Quad PF,6+ Mos 08/01/2017  . Influenza-Unspecified 10/30/2015, 09/13/2016, 07/30/2018, 08/05/2019  . Pneumococcal Conjugate-13 12/05/2015  . Pneumococcal Polysaccharide-23 04/23/2017  . Tdap 12/09/2015  . Zoster 12/09/2015     Objective: Vital Signs: BP 123/78 (BP Location: Left Arm, Patient Position: Sitting, Cuff Size: Small)   Pulse 80   Resp 12   Ht 5\' 2"  (1.575 m)   Wt 149 lb 12.8 oz (67.9 kg)   BMI 27.40 kg/m    Physical Exam Vitals signs and nursing note reviewed.  Constitutional:      Appearance: He is well-developed.  HENT:     Head: Normocephalic and atraumatic.  Eyes:     Conjunctiva/sclera: Conjunctivae normal.     Pupils: Pupils are equal, round, and reactive to light.  Neck:     Musculoskeletal: Normal range of motion and neck supple.  Cardiovascular:     Rate and Rhythm: Normal  rate and regular rhythm.     Heart sounds: Normal heart sounds.  Pulmonary:     Effort: Pulmonary effort is normal.     Breath sounds: Normal breath sounds.  Abdominal:     General: Bowel sounds are normal.     Palpations: Abdomen is soft.  Skin:    General: Skin is warm and dry.     Capillary Refill: Capillary refill takes less than 2 seconds.  Neurological:     Mental Status: He is alert and oriented to person, place, and time.  Psychiatric:        Behavior: Behavior normal.      Musculoskeletal Exam: Limited ROM of C-spine. Shoulder abduction to 90 degrees bilaterally. Elbow joints good ROM with no tenderness and synovitis. Limited ROM of both wrist joints. Extensor tenosynovitis of bilateral wrist joints.  Hip joints, knee joints, ankle joints, MTPs, PIPs, and DIPs good ROM with no  synovitis.  No warmth or effusion of knee joints.  No tenderness or swelling of ankle joints.    CDAI Exam: CDAI Score: 3.6  Patient Global: 3 mm; Provider Global: 3 mm Swollen: 1 ; Tender: 2  Joint Exam      Right  Left  Wrist   Tender  Swollen Tender     Investigation: No additional findings.  Imaging: No results found.  Recent Labs: Lab Results  Component Value Date   WBC 5.4 09/01/2019   HGB 13.7 09/01/2019   PLT 270 09/01/2019   NA 137 09/01/2019   K 4.4 09/01/2019   CL 105 09/01/2019   CO2 21 09/01/2019   GLUCOSE 91 09/01/2019   BUN 14 09/01/2019   CREATININE 1.26 (H) 09/01/2019   BILITOT 1.3 (H) 09/01/2019   ALKPHOS 69 08/03/2018   AST 23 09/01/2019   ALT 18 09/01/2019   PROT 7.1 09/01/2019   ALBUMIN 4.4 08/03/2018   CALCIUM 9.3 09/01/2019   GFRAA 63 09/01/2019   QFTBGOLDPLUS NEGATIVE 08/31/2018    Speciality Comments: No specialty comments available.  Procedures:  No procedures performed Allergies: Patient has no known allergies.   Assessment / Plan:     Visit Diagnoses: Rheumatoid arthritis of multiple sites with negative rheumatoid factor ([place]) - He presents  today with ongoing extensor tenosynovitis of bilateral wrist joints.  He has limited range of motion and tenderness of both wrists on exam.  He has no other joint pain or joint swelling at this time.  He is taking Rinvoq 15 mg 1 tablet by mouth daily and methotrexate 0.3 mL sq injections once weekly.  He has not missed any doses recently.  His symptoms seem to be clinically stable at this time.  He declined a prescription for prednisone at this time.  He declined scheduling an ultrasound guided cortisone zone injection in the future.  He will continue on rainbow can methotrexate as prescribed.  He does not need any refills at this time.  X-rays of both hands were obtained today to assess for interval change.  He was advised to notify us if he develops increased joint pain or joint swelling.  He will follow-up in the office in 5 months.  Plan: XR Hand 2 View Right, XR Hand 2 View Left x-rays were consistent with severe erosive rheumatoid arthritis.  I could not find the comparison x-rays in the system.  High risk medication use -Rinvoq 15 mg 1 tablet daily, methotrexate 0.3 ml every 7 days and folic acid 1 mg 2 tablets daily.  Last TB gold negative on 08/31/2018.  Due for TB gold today and will monitor yearly.  Last lipid panel within normal limits on 08/03/2018. He is on Lipitor 10 mg daily prescribed by Ermalinda Barrios PA-C. Due for lipid panel today and will monitor yearly.  Most recent CBC/CMP within normal limits except for elevated creatinine but trending down on 09/01/2019. - Plan: QuantiFERON-TB Gold Plus, Lipid panel  DDD (degenerative disc disease), cervical: He has limited range of motion of the C-spine with some discomfort.  He experiences neck stiffness on a daily basis.  He denies any symptoms of radiculopathy.  DDD (degenerative disc disease), lumbar: He has good range of motion with no discomfort at this time.  No symptoms of radiculopathy.  Osteopenia of multiple sites: He is on a calcium/vitamin D  supplement daily.  Last DEXA on 11/27/2017 ordered by Dr. Raenette Rover showed T score of -1.5 at left femur neck.  Elevated CK: He is  not having any increased muscle tenderness, muscle aches, muscle weakness at this time.  History of vitamin D deficiency: He is taking a vitamin D supplement.   Dyslipidemia -He takes Lipitor 10 mg by mouth daily.  We will obtain a lipid panel today. plan: Lipid panel  Other medical conditions are listed as follows:   History of colonic polyps  Asymptomatic microscopic hematuria  History of COPD  History of gastric ulcer  History of BPH  Orders: Orders Placed This Encounter  Procedures  . XR Hand 2 View Right  . XR Hand 2 View Left  . QuantiFERON-TB Gold Plus  . Lipid panel   No orders of the defined types were placed in this encounter.   Face-to-face time spent with patient was 30 minutes. Greater than 50% of time was spent in counseling and coordination of care.  Follow-Up Instructions: Return in about 5 months (around 02/21/2020) for Rheumatoid arthritis, DDD.  [name], PA-C  I examined and evaluated the patient with [name] PA.  He continues to complain about left wrist joint pain.  He had mild synovitis and some extensor tenosynovitis.  None of the other joints are swollen.  So far he had the best response to methotrexate and Enbrel combination.  The only option is to do low-dose prednisone which she declined.  We will continue to monitor his disease process.  The plan of care was discussed as noted above.  Bo Merino, MD Note - This record has been created using Editor, commissioning.  Chart creation errors have been sought, but may not always  have been located. Such creation errors do not reflect on  the standard of medical care.

## 2019-09-23 ENCOUNTER — Other Ambulatory Visit: Payer: Self-pay

## 2019-09-23 ENCOUNTER — Encounter: Payer: Self-pay | Admitting: Physician Assistant

## 2019-09-23 ENCOUNTER — Ambulatory Visit (INDEPENDENT_AMBULATORY_CARE_PROVIDER_SITE_OTHER): Payer: Medicare Other

## 2019-09-23 ENCOUNTER — Ambulatory Visit (INDEPENDENT_AMBULATORY_CARE_PROVIDER_SITE_OTHER): Payer: Medicare Other | Admitting: Rheumatology

## 2019-09-23 ENCOUNTER — Ambulatory Visit: Payer: Self-pay

## 2019-09-23 VITALS — BP 123/78 | HR 80 | Resp 12 | Ht 62.0 in | Wt 149.8 lb

## 2019-09-23 DIAGNOSIS — R3121 Asymptomatic microscopic hematuria: Secondary | ICD-10-CM | POA: Diagnosis not present

## 2019-09-23 DIAGNOSIS — M0609 Rheumatoid arthritis without rheumatoid factor, multiple sites: Secondary | ICD-10-CM

## 2019-09-23 DIAGNOSIS — Z79899 Other long term (current) drug therapy: Secondary | ICD-10-CM

## 2019-09-23 DIAGNOSIS — Z8719 Personal history of other diseases of the digestive system: Secondary | ICD-10-CM | POA: Diagnosis not present

## 2019-09-23 DIAGNOSIS — M8589 Other specified disorders of bone density and structure, multiple sites: Secondary | ICD-10-CM

## 2019-09-23 DIAGNOSIS — Z87438 Personal history of other diseases of male genital organs: Secondary | ICD-10-CM | POA: Diagnosis not present

## 2019-09-23 DIAGNOSIS — E785 Hyperlipidemia, unspecified: Secondary | ICD-10-CM | POA: Diagnosis not present

## 2019-09-23 DIAGNOSIS — R748 Abnormal levels of other serum enzymes: Secondary | ICD-10-CM | POA: Diagnosis not present

## 2019-09-23 DIAGNOSIS — M5136 Other intervertebral disc degeneration, lumbar region: Secondary | ICD-10-CM | POA: Diagnosis not present

## 2019-09-23 DIAGNOSIS — Z8639 Personal history of other endocrine, nutritional and metabolic disease: Secondary | ICD-10-CM

## 2019-09-23 DIAGNOSIS — Z86010 Personal history of colonic polyps: Secondary | ICD-10-CM

## 2019-09-23 DIAGNOSIS — Z8709 Personal history of other diseases of the respiratory system: Secondary | ICD-10-CM

## 2019-09-23 DIAGNOSIS — I2510 Atherosclerotic heart disease of native coronary artery without angina pectoris: Secondary | ICD-10-CM

## 2019-09-23 DIAGNOSIS — M5030 Other cervical disc degeneration, unspecified cervical region: Secondary | ICD-10-CM

## 2019-09-23 DIAGNOSIS — Z8711 Personal history of peptic ulcer disease: Secondary | ICD-10-CM

## 2019-09-23 NOTE — Patient Instructions (Signed)
Standing Labs We placed an order today for your standing lab work.    Please come back and get your standing labs in December and every 3 months   We have open lab daily Monday through Thursday from 8:30-12:30 PM and 1:30-4:30 PM and Friday from 8:30-12:30 PM and 1:30-4:00 PM at the office of Dr. Shaili Deveshwar.   You may experience shorter wait times on Monday and Friday afternoons. The office is located at [address], [address], [place], [place] [address] No appointment is necessary.   Labs are drawn by [facility].  You may receive a bill from [facility] for your lab work.  If you wish to have your labs drawn at another location, please call the office 24 hours in advance to send orders.  If you have any questions regarding directions or hours of operation,  please call [phone].   Just as a reminder please drink plenty of water prior to coming for your lab work. Thanks!   

## 2019-09-24 DIAGNOSIS — Z79899 Other long term (current) drug therapy: Secondary | ICD-10-CM | POA: Diagnosis not present

## 2019-09-24 DIAGNOSIS — E785 Hyperlipidemia, unspecified: Secondary | ICD-10-CM | POA: Diagnosis not present

## 2019-09-24 LAB — LIPID PANEL
Cholesterol: 179 mg/dL (ref <200)
HDL: 56 mg/dL (ref >=40)
LDL Cholesterol (Calc): 98 mg/dL (calc)
Non-HDL Cholesterol (Calc): 123 mg/dL (calc) (ref <130)
Total CHOL/HDL Ratio: 3.2 (calc) (ref <5.0)
Triglycerides: 158 mg/dL — ABNORMAL HIGH (ref <150)

## 2019-09-24 NOTE — Progress Notes (Signed)
Triglycerides are mildly elevated-158.  Rest of lipid panel WNL

## 2019-09-26 LAB — QUANTIFERON-TB GOLD PLUS
Mitogen-NIL: 4.13 IU/mL
NIL: 0.03 IU/mL
QuantiFERON-TB Gold Plus: NEGATIVE
TB1-NIL: 0 IU/mL
TB2-NIL: <0 IU/mL

## 2019-09-27 NOTE — Progress Notes (Signed)
TB gold negative

## 2019-10-06 DIAGNOSIS — Z20828 Contact with and (suspected) exposure to other viral communicable diseases: Secondary | ICD-10-CM | POA: Diagnosis not present

## 2019-10-07 ENCOUNTER — Other Ambulatory Visit: Payer: Self-pay | Admitting: Rheumatology

## 2019-10-07 DIAGNOSIS — M0609 Rheumatoid arthritis without rheumatoid factor, multiple sites: Secondary | ICD-10-CM

## 2019-10-11 ENCOUNTER — Other Ambulatory Visit: Payer: Self-pay | Admitting: Rheumatology

## 2019-10-11 DIAGNOSIS — M0609 Rheumatoid arthritis without rheumatoid factor, multiple sites: Secondary | ICD-10-CM

## 2019-10-15 DIAGNOSIS — N401 Enlarged prostate with lower urinary tract symptoms: Secondary | ICD-10-CM | POA: Diagnosis not present

## 2019-10-15 DIAGNOSIS — R351 Nocturia: Secondary | ICD-10-CM | POA: Diagnosis not present

## 2019-10-15 DIAGNOSIS — R300 Dysuria: Secondary | ICD-10-CM | POA: Diagnosis not present

## 2019-11-11 ENCOUNTER — Encounter: Payer: Self-pay | Admitting: Family Medicine

## 2019-11-11 ENCOUNTER — Other Ambulatory Visit: Payer: Self-pay

## 2019-11-11 ENCOUNTER — Ambulatory Visit (INDEPENDENT_AMBULATORY_CARE_PROVIDER_SITE_OTHER): Payer: Medicare Other | Admitting: Family Medicine

## 2019-11-11 VITALS — BP 114/62 | HR 100 | Temp 98.0°F | Wt 137.0 lb

## 2019-11-11 DIAGNOSIS — I2510 Atherosclerotic heart disease of native coronary artery without angina pectoris: Secondary | ICD-10-CM | POA: Diagnosis not present

## 2019-11-11 DIAGNOSIS — U071 COVID-19: Secondary | ICD-10-CM | POA: Diagnosis not present

## 2019-11-11 DIAGNOSIS — Z79899 Other long term (current) drug therapy: Secondary | ICD-10-CM | POA: Diagnosis not present

## 2019-11-11 DIAGNOSIS — J438 Other emphysema: Secondary | ICD-10-CM

## 2019-11-11 NOTE — Progress Notes (Signed)
   Subjective:  Documentation for virtual audio and video telecommunications through [facility] encounter:  The patient was located at home. 2 patient identifiers used.  The provider was located in the office. The patient did consent to this visit and is aware of possible charges through their insurance for this visit.  The other persons participating in this telemedicine service were none.    Patient ID: Tony Mcmillan, male    DOB: 1941-01-21, 78 y.o.   MRN: [id number]  HPI Chief Complaint  Patient presents with  . covid positive    covid positive - [facility]. chills,weakness,some SOB,    Complains of a 10 day history fever, chills, fatigue, body aches, cough, diarrhea.  States he still has chills and feels weak but no longer having fever, diarrhea and his cough has improved significantly.  Lost sense of smell and taste.   States he went to the [facility] last Friday for a Covid-19 test and he was informed that he was positive for Covid-19  2 days ago.   Drinking water and OJ. States he is not eating much. Appetite is poor.   Taking Theraflu.   Denies dizziness, headache, sore throat, chest pain, palpitations, N/V.  No worsening shortness of breath.   He has a pulse oximeter at home and checks his oxygen level. It is has not dropped below 93%.   States his daughters keep in touch with him and can get him supplies and food.   Reviewed allergies, medications, past medical, surgical, family, and social history.    Review of Systems Pertinent positives and negatives in the history of present illness.     Objective:   Physical Exam BP 114/62   Pulse 100   Temp 98 F (36.7 C)   Wt 137 lb (62.1 kg)   SpO2 94%   BMI 25.06 kg/m   Alert and oriented and in no acute distress. Respirations are unlabored, no sign of accessory muscle use. Speaking in complete sentences.       Assessment & Plan:  COVID-19 virus infection  Other emphysema ([place])  High risk  medication use  He has underlying lung disease. This is day 10 of symptoms and he only found out 2 days ago that he was positive for Covid-19. He does not appear to be in any distress. He is checking his vital signs including his oxygen saturation and his pulse ox has remained above 93%. He is not interested in being signed up for the [facility] monitoring program.  Encouraged him to continue staying hydrated and getting adequate nutrition. Discussed that I am not in the office next week but I would like for him to be seen virtually. He is fine to see one of the other providers in my office. Strict precautions for him to go to an urgent care if he get worse   Time spent on call was 22 minutes and in review of previous records 25 minutes total.  This virtual service is not related to other E/M service within previous 7 days.

## 2019-11-15 ENCOUNTER — Ambulatory Visit (INDEPENDENT_AMBULATORY_CARE_PROVIDER_SITE_OTHER): Payer: Medicare Other | Admitting: Medical

## 2019-11-15 ENCOUNTER — Encounter: Payer: Self-pay | Admitting: Medical

## 2019-11-15 ENCOUNTER — Other Ambulatory Visit: Payer: Self-pay

## 2019-11-15 ENCOUNTER — Telehealth: Payer: Self-pay

## 2019-11-15 VITALS — Temp 97.1°F | Ht 62.0 in | Wt 137.0 lb

## 2019-11-15 DIAGNOSIS — J438 Other emphysema: Secondary | ICD-10-CM

## 2019-11-15 DIAGNOSIS — R634 Abnormal weight loss: Secondary | ICD-10-CM | POA: Insufficient documentation

## 2019-11-15 DIAGNOSIS — R05 Cough: Secondary | ICD-10-CM | POA: Diagnosis not present

## 2019-11-15 DIAGNOSIS — R059 Cough, unspecified: Secondary | ICD-10-CM | POA: Insufficient documentation

## 2019-11-15 DIAGNOSIS — I2510 Atherosclerotic heart disease of native coronary artery without angina pectoris: Secondary | ICD-10-CM | POA: Diagnosis not present

## 2019-11-15 DIAGNOSIS — U071 COVID-19: Secondary | ICD-10-CM | POA: Diagnosis not present

## 2019-11-15 DIAGNOSIS — M069 Rheumatoid arthritis, unspecified: Secondary | ICD-10-CM | POA: Diagnosis not present

## 2019-11-15 MED ORDER — MUCINEX DM 30-600 MG PO TB12: 1.0000 | ORAL_TABLET | Freq: Two times a day (BID) | ORAL | 0 refills | Status: DC

## 2019-11-15 MED ORDER — PREDNISONE 20 MG PO TABS: 40.0000 mg | ORAL_TABLET | Freq: Every day | ORAL | 0 refills | Status: DC

## 2019-11-15 NOTE — Telephone Encounter (Signed)
Patient states he is COVID positive. Advised patient to discontinue rinvoq and methotrexate and he may resume the medications once he is completely recovered which will be at least 2 weeks. Patient verbalized understanding and states he has already discontinued both.

## 2019-11-15 NOTE — Telephone Encounter (Signed)
-----   Message from Bo Merino, MD sent at 11/15/2019  2:18 PM EST ----- Is patient is Covid positive, we will discontinue Rinvoq and methotrexate.  He may resume the medications once he is completely recovered which will be at least 2 weeks. Thank you Bo Merino, MD  ----- Message ----- From: Caryl Ada Sent: 11/15/2019  12:36 PM EST To: Bo Merino, MD

## 2019-11-15 NOTE — Progress Notes (Signed)
Subjective:     Patient ID: Tony Mcmillan, male   DOB: Jan 15, 1941, 78 y.o.   MRN: [id number]  This visit type was conducted due to national recommendations for restrictions regarding the COVID-19 Pandemic (e.g. social distancing) in an effort to limit this patient's exposure and mitigate transmission in our community.  Due to their co-morbid illnesses, this patient is at least at moderate risk for complications without adequate follow up.  This format is felt to be most appropriate for this patient at this time.    Documentation for virtual audio and video telecommunications through Zoom encounter:  The patient was located at home. The provider was located in the office. The patient did consent to this visit and is aware of possible charges through their insurance for this visit.  The other persons participating in this telemedicine service were his son Tony Mcmillan. Time spent on call was 20 minutes and in review of previous records 20 minutes total.  This virtual service is not related to other E/M service within previous 7 days.   HPI Chief Complaint  Patient presents with  . Follow-up    weakness and congestion   Medical team: PCP Girtha Rm, NP-C here at our office who is out of office today Rheum, Dr. Bo Merino Pulm, Dr. Brand Males and Geraldo Pitter, NP   Virtual consult today for follow-up on Covid.  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  He started having symptoms on November 9.  His symptoms have included fever, chills, fatigue, body aches, cough, loose stools, decreased appetite, loss of taste and smell.  His primary care provider Tony Dingwall, NP at our office talked to him on the 19th.   As of today he feels some improvement.   He is requesting some medication to help with congestion and mucus.  He does note that he is hydrating well, drinking water  throughout the day.  His pulse ox at home was 94% today which is the same or better than last week.  He is eating some throughout the day.  Taking Tylenol for fever and not feeling well, TheraFlu.  He lives alone but his son comes by and checks on him daily.  Overall he does not feel any worse, gradually improving.  Today he denies shortness of breath wheezing or extreme fatigue.  He is good about doing his Bevespi lung medication daily.  He stopped his rheumatoid drugs when he got sick due to the potential for decreasing his immune response.  No other aggravating or relieving factors. No other complaint.  Past Medical History:  Diagnosis Date  . Anemia   . Atypical chest pain    a. 09/2016 MV: EF 63%, normal perfusion.  . Blood transfusion without reported diagnosis   . Diastolic dysfunction    a. 2015 Echo: EF >55%;  b. 09/2016 Echo: EF 55-60%, no rwma, Gr1 DD, triv AI/MR, mild TR, PASP 23mmHg.  . Emphysema of lung ([place]) 02/08/2016  . GERD (gastroesophageal reflux disease)   . History of systemic steroid therapy 10/30/2017  . Rheumatoid arthritis ([place])    Current Outpatient Medications on File Prior to Visit  Medication Sig Dispense Refill  . atorvastatin (LIPITOR) 10 MG tablet Take 1 tablet (10 mg total) by mouth daily. 90 tablet 3  . Calcium Carb-Cholecalciferol (CALCIUM-VITAMIN D3) 600-400 MG-UNIT TABS Take by mouth.    . Glycopyrrolate-Formoterol (BEVESPI AEROSPHERE) 9-4.8 MCG/ACT AERO Inhale 2  puffs into the lungs 2 (two) times daily. 10.7 g 3  . methotrexate 50 MG/2ML injection INJECT 0.3 ML (7.5 MG) INTO THE SKIN ONCE A WEEK (EXPIRES IN 28 DAYS) 8 mL 0  . MYRBETRIQ 50 MG TB24 tablet     . TUBERCULIN SYR 1CC/27GX1/2" (B-D TB SYRINGE 1CC/27GX1/2") 27G X 1/2" 1 ML MISC USE 1 SYRINGE TO INJECT UNDER THE SKIN ONCE A WEEK, TO BE USED WITH WEEKLY METHOTREXATE INJECTIONS 12 each 3  . folic acid (FOLVITE) 1 MG tablet TAKE 2 TABLETS (2 MG TOTAL) BY MOUTH DAILY. (Patient not taking: Reported on  11/11/2019) 180 tablet 3  . RINVOQ 15 MG TB24 TAKE 1 TABLET (15 MG) DAILY (Patient not taking: Reported on 11/11/2019) 90 tablet 0   No current facility-administered medications on file prior to visit.     Review of Systems As in subjective    Objective:   Physical Exam Due to coronavirus pandemic stay at home measures, patient visit was virtual and they were not examined in person.   Temp (!) 97.1 F (36.2 C)   Ht 5\' 2"  (1.575 m)   Wt 137 lb (62.1 kg)   BMI 25.06 kg/m        Assessment:     Encounter Diagnoses  Name Primary?  . COVID-19 virus infection Yes  . Cough   . Other emphysema ([place])   . Rheumatoid arthritis involving multiple sites, unspecified whether rheumatoid factor present ([place])   . Weight loss        Plan:     We discussed his symptoms and concerns, his recent Covid positive status.  With his permission I called and talked to his son, Tony Mcmillan. who checks on him daily.  It is not clear what his weight is.  Mr. Azucena read all of his weight today is 133.8 pounds.  If this is true this would be a roughly 15 pound weight loss in the past 2 months.  His son does not think he is lost that much weight and does not think he appears to have lost weight in general.  His son is going to try and get an accurate weight on him.  Overall he does not appear to be any worse than he was doing last week.  I will send out medications below to help with his symptoms.  He voiced that he is doing well with his hydration.  I advised he continue good hydration and rest.  We discussed the need to get an accurate weight which his son will help him with.  If he indeed has lost 15 pounds since August, he may need additional work-up such as imaging and labs.  He seems very reluctant to be seen at the hospital unless absolutely necessary.  Currently he denies any worsening lung symptoms.  The only question is whether he is dehydrated or not in regards to the weight loss.  Based on his  report of hydration it sound like he is doing okay  I asked him to call back in the next few days if his concerns or if any additional weight loss.  We will await callback from him or his son in regards to confirmation on weight.    C/t Bevespi.  C/t to hold off on rheumatoid medications for now until he coordinates with rheumatology after he has recovered from Covid.  If he is in fact losing weight then I would recommend baseline labs such as CMET, CBC with Diff, TSH, Chest Xray  or other imaging.  He voiced understanding and agreement with plan  Kary was seen today for follow-up.  Diagnoses and all orders for this visit:  COVID-19 virus infection  Cough  Other emphysema ([facility])  Rheumatoid arthritis involving multiple sites, unspecified whether rheumatoid factor present ([place])  Weight loss  Other orders -     Dextromethorphan-guaiFENesin (MUCINEX DM) 30-600 MG TB12; Take 1 tablet by mouth 2 (two) times daily. -     predniSONE (DELTASONE) 20 MG tablet; Take 2 tablets (40 mg total) by mouth daily with breakfast.

## 2019-11-16 ENCOUNTER — Other Ambulatory Visit: Payer: Self-pay

## 2019-11-23 NOTE — Progress Notes (Signed)
Sounds like he may need a follow up with labs.

## 2019-11-24 NOTE — Progress Notes (Signed)
After 2 weeks is fine. Thanks.

## 2019-12-13 ENCOUNTER — Encounter: Payer: Self-pay | Admitting: Family Medicine

## 2019-12-13 ENCOUNTER — Ambulatory Visit (INDEPENDENT_AMBULATORY_CARE_PROVIDER_SITE_OTHER): Payer: Medicare Other | Admitting: Family Medicine

## 2019-12-13 ENCOUNTER — Other Ambulatory Visit: Payer: Self-pay

## 2019-12-13 VITALS — BP 122/88 | HR 100 | Temp 98.0°F | Resp 18 | Wt 144.6 lb

## 2019-12-13 DIAGNOSIS — I2510 Atherosclerotic heart disease of native coronary artery without angina pectoris: Secondary | ICD-10-CM

## 2019-12-13 DIAGNOSIS — H6121 Impacted cerumen, right ear: Secondary | ICD-10-CM | POA: Diagnosis not present

## 2019-12-13 DIAGNOSIS — H938X1 Other specified disorders of right ear: Secondary | ICD-10-CM

## 2019-12-13 DIAGNOSIS — Z8616 Personal history of COVID-19: Secondary | ICD-10-CM | POA: Insufficient documentation

## 2019-12-13 DIAGNOSIS — Z8619 Personal history of other infectious and parasitic diseases: Secondary | ICD-10-CM | POA: Diagnosis not present

## 2019-12-13 DIAGNOSIS — M069 Rheumatoid arthritis, unspecified: Secondary | ICD-10-CM | POA: Diagnosis not present

## 2019-12-13 DIAGNOSIS — R7989 Other specified abnormal findings of blood chemistry: Secondary | ICD-10-CM

## 2019-12-13 NOTE — Progress Notes (Signed)
   Subjective:    Patient ID: Tony Mcmillan, male    DOB: [birth year], 78 y.o.   MRN: [id number]  HPI Chief Complaint  Patient presents with  . FOLLOW-UP    follow-up on covid-19.    He is here to follow up on Covid-19 infection on from November 2020. Symptoms began on 11/01/2019.  States he is basically back to baseline and is thankful he recovered. States he was the sickest he has ever been.   Right ear feels full. No pain.   He is currently off of his medication for RA. This was advised by his rheumatologist. He will follow up with Dr. Estanislado Pandy to know when to start taking it again.   Denies any other concerns today.  Denies fever, chills, dizziness, chest pain, palpitations, shortness of breath, abdominal pain, N/V/D, urinary symptoms, LE edema.      Review of Systems Pertinent positives and negatives in the history of present illness.     Objective:   Physical Exam BP 122/88   Pulse 100   Temp 98 F (36.7 C)   Resp 18   Wt 144 lb 9.6 oz (65.6 kg)   SpO2 93%   BMI 26.45 kg/m   Alert and in no distress.  Unable to visualize right TM initially due to cerumen impaction.  Tympanic membranes and canals are normal post right ear lavage.  Cardiac exam shows a regular rhythm without murmurs or gallops. Lungs are clear to auscultation, decreased breath sounds to the right lower lobe.  Extremities without edema.  Skin is warm and dry.  Normal mood and thought process.       Assessment & Plan:  History of 2019 novel coronavirus disease (COVID-19) - Plan: Comprehensive metabolic panel, CBC with Differential, SAR CoV2 Serology (COVID 19)AB(IGG)IA  Ear fullness, right  Impacted cerumen of right ear  Rheumatoid arthritis involving multiple sites, unspecified whether rheumatoid factor present ([place]) - Plan: Comprehensive metabolic panel, CBC with Differential  Elevated serum creatinine - Plan: Comprehensive metabolic panel  He is relatively back to baseline from recent Covid  infection.  He has not started back on his rheumatologic medications.  Plans to follow-up with his rheumatologist to determine when he can start back on these. We will screen for Covid antibodies. Right ear with cerumen impaction.  Lavage performed by CMA Minette Headland.  He tolerated this well and reported improvement in symptoms post lavage. Check labs and follow-up

## 2019-12-14 ENCOUNTER — Encounter: Payer: Self-pay | Admitting: Family Medicine

## 2019-12-14 LAB — COMPREHENSIVE METABOLIC PANEL
ALT: 25 IU/L (ref 0–44)
AST: 27 IU/L (ref 0–40)
Albumin/Globulin Ratio: 1.4 (ref 1.2–2.2)
Albumin: 4.4 g/dL (ref 3.7–4.7)
Alkaline Phosphatase: 88 IU/L (ref 39–117)
BUN/Creatinine Ratio: 9 — ABNORMAL LOW (ref 10–24)
BUN: 11 mg/dL (ref 8–27)
Bilirubin Total: 0.7 mg/dL (ref 0.0–1.2)
CO2: 19 mmol/L — ABNORMAL LOW (ref 20–29)
Calcium: 9.6 mg/dL (ref 8.6–10.2)
Chloride: 102 mmol/L (ref 96–106)
Creatinine, Ser: 1.17 mg/dL (ref 0.76–1.27)
GFR calc Af Amer: 69 mL/min/{1.73_m2} (ref >59)
GFR calc non Af Amer: 60 mL/min/{1.73_m2} (ref >59)
Globulin, Total: 3.1 g/dL (ref 1.5–4.5)
Glucose: 100 mg/dL — ABNORMAL HIGH (ref 65–99)
Potassium: 4.8 mmol/L (ref 3.5–5.2)
Sodium: 139 mmol/L (ref 134–144)
Total Protein: 7.5 g/dL (ref 6.0–8.5)

## 2019-12-14 LAB — CBC WITH DIFFERENTIAL/PLATELET
Basophils Absolute: 0 10*3/uL (ref 0.0–0.2)
Basos: 1 %
EOS (ABSOLUTE): 0.1 10*3/uL (ref 0.0–0.4)
Eos: 2 %
Hematocrit: 43.7 % (ref 37.5–51.0)
Hemoglobin: 14.4 g/dL (ref 13.0–17.7)
Immature Grans (Abs): 0 10*3/uL (ref 0.0–0.1)
Immature Granulocytes: 0 %
Lymphocytes Absolute: 0.5 10*3/uL — ABNORMAL LOW (ref 0.7–3.1)
Lymphs: 11 %
MCH: 28.5 pg (ref 26.6–33.0)
MCHC: 33 g/dL (ref 31.5–35.7)
MCV: 86 fL (ref 79–97)
Monocytes Absolute: 0.5 10*3/uL (ref 0.1–0.9)
Monocytes: 12 %
Neutrophils Absolute: 3.2 10*3/uL (ref 1.4–7.0)
Neutrophils: 74 %
Platelets: 276 10*3/uL (ref 150–450)
RBC: 5.06 x10E6/uL (ref 4.14–5.80)
RDW: 16.2 % — ABNORMAL HIGH (ref 11.6–15.4)
WBC: 4.4 10*3/uL (ref 3.4–10.8)

## 2019-12-14 LAB — SAR COV2 SEROLOGY (COVID19)AB(IGG),IA: DiaSorin SARS-CoV-2 Ab, IgG: POSITIVE — AB

## 2020-02-15 ENCOUNTER — Other Ambulatory Visit: Payer: Self-pay

## 2020-02-15 MED ORDER — BEVESPI AEROSPHERE 9-4.8 MCG/ACT IN AERO: 2.0000 | INHALATION_SPRAY | Freq: Two times a day (BID) | RESPIRATORY_TRACT | 0 refills | Status: DC

## 2020-02-16 NOTE — Progress Notes (Signed)
Office Visit Note  Patient: Tony Mcmillan             Date of Birth: April 08, 1941           MRN: [id number]             PCP: Girtha Rm, NP-C Referring: Girtha Rm, NP-C Visit Date: 02/21/2020 Occupation: @GUAROCC @  Subjective:  Left wrist joint pain   History of Present Illness: Tony Mcmillan is a 79 y.o. male with history of seronegative rheumatoid arthritis and DDD.  He is prescribed Rinvoq 15 mg 1 tablet by mouth daily, MTX 0.3 ml sq once weekly, and folic acid 1 mg po daily.  He was diagnosed with COVID-19 in November 2020.  He held Rinvoq and MTX.  His symptoms resolved while taking prednisone.  He has not restarted Rinvoq or MTX.  He has chronic left wrist joint pain and swelling.  He has stiffness in both shoulder joints.  He uses a cane to assist with ambulation.   He has been experiencing abdominal pain intermittently over the past several months.  He denies any constipation.    Activities of Daily Living:  Patient reports morning stiffness for 30   minutes.   Patient Denies nocturnal pain.  Difficulty dressing/grooming: Denies Difficulty climbing stairs: Denies Difficulty getting out of chair: Denies Difficulty using hands for taps, buttons, cutlery, and/or writing: Denies  Review of Systems  Constitutional: Negative for fatigue and night sweats.  HENT: Negative for mouth sores, mouth dryness and nose dryness.   Eyes: Negative for redness and dryness.  Respiratory: Negative for cough, hemoptysis, shortness of breath and difficulty breathing.   Cardiovascular: Negative for chest pain, palpitations, hypertension, irregular heartbeat and swelling in legs/feet.  Gastrointestinal: Positive for abdominal pain. Negative for blood in stool, constipation and diarrhea.  Endocrine: Negative for increased urination.  Genitourinary: Negative for painful urination.  Musculoskeletal: Positive for arthralgias, joint pain, joint swelling and morning stiffness. Negative for  myalgias, muscle weakness, muscle tenderness and myalgias.  Skin: Negative for color change, rash, hair loss, nodules/bumps, skin tightness, ulcers and sensitivity to sunlight.  Allergic/Immunologic: Negative for susceptible to infections.  Neurological: Negative for dizziness, fainting, memory loss, night sweats and weakness.  Hematological: Negative for swollen glands.  Psychiatric/Behavioral: Negative for depressed mood and sleep disturbance. The patient is not nervous/anxious.     PMFS History:  Patient Active Problem List   Diagnosis Date Noted  . History of 2019 novel coronavirus disease (COVID-19) 12/13/2019  . COVID-19 virus infection 11/15/2019  . Cough 11/15/2019  . Weight loss 11/15/2019  . Bilateral carotid bruits 02/09/2019  . Chest pain 02/08/2019  . Intermittent palpitations 01/29/2019  . Prediabetes 10/29/2018  . History of systemic steroid therapy 10/30/2017  . Feeling of chest tightness, unclear cause, ? due to RA 02/21/2017  . Pulmonary emphysema ([place]) 12/18/2016  . Dyspnea and respiratory abnormality 11/25/2016  . 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[place]) 11/04/2016  . High risk medication use 11/04/2016  . 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 Vitamin D deficiency 11/04/2016  . 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 History of anemia 07/02/2016  . History of colonic polyps 07/02/2016  . History of gastric ulcer 07/02/2016  . Asymptomatic microscopic hematuria 04/03/2016  . Benign prostatic hyperplasia 04/03/2016  . Renal cyst, left 04/03/2016  . 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 DDD (degenerative disc disease), lumbar 04/03/2016  . Colonic polyp 02/23/2016  . Duodenal ulcer 02/23/2016    Past Medical  History:  Diagnosis Date  . Anemia   . Atypical chest pain    a. 09/2016 MV: EF 63%, normal perfusion.  . Blood transfusion without reported diagnosis   . Diastolic dysfunction    a. 2015 Echo: EF >55%;  b. 09/2016 Echo:  EF 55-60%, no rwma, Gr1 DD, triv AI/MR, mild TR, PASP 3mmHg.  . Emphysema of lung ([place]) 02/08/2016  . GERD (gastroesophageal reflux disease)   . History of systemic steroid therapy 10/30/2017  . Rheumatoid arthritis ([place])     Family History  Problem Relation Age of Onset  . Hypertension Mother   . Hypertension Father   . Diabetes Father   . Heart disease Father        stents  . Hyperlipidemia Sister   . Diabetes Sister   . Stroke Brother   . Diabetes Brother   . Heart disease Brother        stents  . Colon polyps Neg Hx   . Esophageal cancer Neg Hx   . Stomach cancer Neg Hx   . Rectal cancer Neg Hx   . Colon cancer Neg Hx    Past Surgical History:  Procedure Laterality Date  . ANTERIOR CERVICAL CORPECTOMY    . ANTERIOR CERVICAL CORPECTOMY  12/2014   for infection. this was in [place]  . CATARACT EXTRACTION     both eyes  . COLOSTOMY REVERSAL     had infection on buttocks and had to have a skin graft- had colostomy to help area stay clean and heal  . HERNIA REPAIR    . SPINE SURGERY     Social History   Social History Narrative   Retired. Lives alone.    Immunization History  Administered Date(s) Administered  . Influenza, High Dose Seasonal PF 07/23/2017, 08/05/2019  . Influenza,inj,Quad PF,6+ Mos 08/01/2017  . Influenza-Unspecified 10/30/2015, 09/13/2016, 07/30/2018, 08/05/2019  . Pneumococcal Conjugate-13 12/05/2015  . Pneumococcal Polysaccharide-23 04/23/2017  . Tdap 12/09/2015  . Zoster 12/09/2015     Objective: Vital Signs: BP (!) 147/98 (BP Location: Left Arm, Patient Position: Sitting, Cuff Size: Small)   Pulse (!) 110   Resp 12   Ht 5\' 2"  (1.575 m)   Wt 146 lb 12.8 oz (66.6 kg)   BMI 26.85 kg/m    Physical Exam Vitals and nursing note reviewed.  Constitutional:      Appearance: He is well-developed.  HENT:     Head: Normocephalic and atraumatic.  Eyes:     Conjunctiva/sclera: Conjunctivae normal.     Pupils: Pupils are equal, round, and reactive  to light.  Pulmonary:     Effort: Pulmonary effort is normal.  Abdominal:     General: Bowel sounds are normal.     Palpations: Abdomen is soft.  Musculoskeletal:     Cervical back: Normal range of motion and neck supple.  Skin:    General: Skin is warm and dry.     Capillary Refill: Capillary refill takes less than 2 seconds.  Neurological:     Mental Status: He is alert and oriented to person, place, and time.  Psychiatric:        Behavior: Behavior normal.      Musculoskeletal Exam: shoulder joint abduction to 90 degrees bilaterally. Elbow joints good ROM with no tenderness or inflammation.  Limited ROM of both wrist joints. Mild extensor tenosynovitis of left wrist.  Synovial thickening of right 1st MCP joint.  Hip joints, knee joints, ankle joints, MTPs, PIPs, and DIPs good ROM with  no synovitis.  No warmth or effusion of knee joints.  No tenderness or swelling of ankle joints.   CDAI Exam: CDAI Score: 2.8  Patient Global: 5 mm; Provider Global: 3 mm Swollen: 1 ; Tender: 1  Joint Exam 02/21/2020      Right  Left  Wrist     Swollen Tender     Investigation: No additional findings.  Imaging: No results found.  Recent Labs: Lab Results  Component Value Date   WBC 4.4 12/13/2019   HGB 14.4 12/13/2019   PLT 276 12/13/2019   NA 139 12/13/2019   K 4.8 12/13/2019   CL 102 12/13/2019   CO2 19 (L) 12/13/2019   GLUCOSE 100 (H) 12/13/2019   BUN 11 12/13/2019   CREATININE 1.17 12/13/2019   BILITOT 0.7 12/13/2019   ALKPHOS 88 12/13/2019   AST 27 12/13/2019   ALT 25 12/13/2019   PROT 7.5 12/13/2019   ALBUMIN 4.4 12/13/2019   CALCIUM 9.6 12/13/2019   GFRAA 69 12/13/2019   QFTBGOLDPLUS NEGATIVE 09/24/2019    Speciality Comments: No specialty comments available.  Procedures:  No procedures performed Allergies: Patient has no known allergies.   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  Mild inflammation and tenderness was noted on exam today.  He has limited range of motion of both wrist joints.  He is not experiencing any other joint pain or inflammation at this time.  He has been off of Rinvoq and methotrexate since the end of November 2020 due to being diagnosed with COVID-19.  He was prescribed prednisone and his COVID-19 symptoms resolved but he has not resumed Rinvoq or methotrexate yet.  He has been experiencing generalized abdominal pain over the past several months but has not had any constipation, diarrhea, blood in his stool, nausea, or vomiting.  Due to the duration of his symptoms we will place a referral to GI for further evaluation.  Although he has been holding Rinvoq over the past several months, we explained the risk of GI perforations while taking Rinvoq he will require clearance prior to restarting on Rinvoq.  He will follow-up in the office in 2 months.  High risk medication use - Rinvoq 15 mg 1 tablet by mouth daily, methotrexate 0.3 ml every 7 days and folic acid 1 mg 2 tablets daily.  CBC and CMP were drawn on 12/13/2019.  CBC and CMP were drawn today to monitor for drug toxicity.  He will return for lab work in June and every 3 months.  TB gold negative on 10-20.- Plan: Ambulatory referral to Gastroenterology, COMPLETE METABOLIC PANEL WITH GFR, CBC with Differential/Platelet  DDD (degenerative disc disease), cervical: He has limited range of motion of the C-spine.  He experiences intermittent pain and stiffness in his neck.  He has no symptoms of radiculopathy at this time.  DDD (degenerative disc disease), lumbar: He is not having any lower back pain at this time.  No midline spinal tenderness.  No symptoms of radiculopathy.  Osteopenia of multiple sites -  Last DEXA on 11/27/2017 ordered by Dr. Raenette Rover showed T score of -1.5 at left femur neck.  He is due to update DEXA, and he will reach out to his PCP to  schedule a bone density.  Elevated CK: CK was 237 on 04/21/2019.  He has not noticed any increased muscle tenderness or muscle weakness.  We  will continue to monitor CK.   History of vitamin D deficiency: He is taking calcium and vitamin D supplement.  Generalized abdominal pain -He has been experiencing generalized abdominal pain over the past several months.  He has not had any constipation or diarrhea recently.  No blood in his stool.  No nausea or vomiting.  He has not been evaluated by the GI specialist but would like a referral today.  He has prescribed Rinvoq but has been off of it since November 2020.  We discussed that there is a risk for GI perforation while taking Rinvoq.  He will require clearance to restart on Rinvoq. plan: Ambulatory referral to Gastroenterology  Other medical conditions are listed as follows:   History of colonic polyps  History of COPD  Asymptomatic microscopic hematuria  History of gastric ulcer  Dyslipidemia - He takes Lipitor 10 mg by mouth daily  History of BPH    Orders: Orders Placed This Encounter  Procedures  . COMPLETE METABOLIC PANEL WITH GFR  . CBC with Differential/Platelet  . Ambulatory referral to Gastroenterology   No orders of the defined types were placed in this encounter.    Follow-Up Instructions: Return in about 2 months (around 04/22/2020) for Rheumatoid arthritis.   Ofilia Neas, PA-C  Note - This record has been created using Dragon software.  Chart creation errors have been sought, but may not always  have been located. Such creation errors do not reflect on  the standard of medical care.

## 2020-02-21 ENCOUNTER — Encounter: Payer: Self-pay | Admitting: Gastroenterology

## 2020-02-21 ENCOUNTER — Ambulatory Visit (INDEPENDENT_AMBULATORY_CARE_PROVIDER_SITE_OTHER): Payer: Medicare HMO | Admitting: Physician Assistant

## 2020-02-21 ENCOUNTER — Encounter: Payer: Self-pay | Admitting: Physician Assistant

## 2020-02-21 ENCOUNTER — Other Ambulatory Visit: Payer: Self-pay

## 2020-02-21 VITALS — BP 147/98 | HR 110 | Resp 12 | Ht 62.0 in | Wt 146.8 lb

## 2020-02-21 DIAGNOSIS — M5136 Other intervertebral disc degeneration, lumbar region: Secondary | ICD-10-CM

## 2020-02-21 DIAGNOSIS — M5030 Other cervical disc degeneration, unspecified cervical region: Secondary | ICD-10-CM | POA: Diagnosis not present

## 2020-02-21 DIAGNOSIS — M0609 Rheumatoid arthritis without rheumatoid factor, multiple sites: Secondary | ICD-10-CM | POA: Diagnosis not present

## 2020-02-21 DIAGNOSIS — E785 Hyperlipidemia, unspecified: Secondary | ICD-10-CM

## 2020-02-21 DIAGNOSIS — Z79899 Other long term (current) drug therapy: Secondary | ICD-10-CM | POA: Diagnosis not present

## 2020-02-21 DIAGNOSIS — Z8719 Personal history of other diseases of the digestive system: Secondary | ICD-10-CM

## 2020-02-21 DIAGNOSIS — M8589 Other specified disorders of bone density and structure, multiple sites: Secondary | ICD-10-CM

## 2020-02-21 DIAGNOSIS — R3121 Asymptomatic microscopic hematuria: Secondary | ICD-10-CM

## 2020-02-21 DIAGNOSIS — Z8709 Personal history of other diseases of the respiratory system: Secondary | ICD-10-CM

## 2020-02-21 DIAGNOSIS — Z87438 Personal history of other diseases of male genital organs: Secondary | ICD-10-CM

## 2020-02-21 DIAGNOSIS — Z86010 Personal history of colonic polyps: Secondary | ICD-10-CM

## 2020-02-21 DIAGNOSIS — R748 Abnormal levels of other serum enzymes: Secondary | ICD-10-CM

## 2020-02-21 DIAGNOSIS — Z8711 Personal history of peptic ulcer disease: Secondary | ICD-10-CM

## 2020-02-21 DIAGNOSIS — R1084 Generalized abdominal pain: Secondary | ICD-10-CM

## 2020-02-21 DIAGNOSIS — Z8639 Personal history of other endocrine, nutritional and metabolic disease: Secondary | ICD-10-CM

## 2020-02-21 NOTE — Patient Instructions (Signed)
Standing Labs We placed an order today for your standing lab work.    Please come back and get your standing labs in June and every 3 months.   We have open lab daily Monday through Thursday from 8:30-12:30 PM and 1:30-4:30 PM and Friday from 8:30-12:30 PM and 1:30-4:00 PM at the office of Dr. Shaili Deveshwar.   You may experience shorter wait times on Monday and Friday afternoons. The office is located at [address], [address], [place], [place] [address] No appointment is necessary.   Labs are drawn by [facility].  You may receive a bill from [facility] for your lab work.  If you wish to have your labs drawn at another location, please call the office 24 hours in advance to send orders.  If you have any questions regarding directions or hours of operation,  please call [phone].   Just as a reminder please drink plenty of water prior to coming for your lab work. Thanks!  

## 2020-02-22 LAB — COMPLETE METABOLIC PANEL WITH GFR
AG Ratio: 1.2 (calc) (ref 1.0–2.5)
ALT: 55 U/L — ABNORMAL HIGH (ref 9–46)
AST: 53 U/L — ABNORMAL HIGH (ref 10–35)
Albumin: 3.9 g/dL (ref 3.6–5.1)
Alkaline phosphatase (APISO): 65 U/L (ref 35–144)
BUN: 14 mg/dL (ref 7–25)
CO2: 25 mmol/L (ref 20–32)
Calcium: 9.4 mg/dL (ref 8.6–10.3)
Chloride: 104 mmol/L (ref 98–110)
Creat: 1.13 mg/dL (ref 0.70–1.18)
GFR, Est African American: 72 mL/min/{1.73_m2} (ref >=60)
GFR, Est Non African American: 62 mL/min/{1.73_m2} (ref >=60)
Globulin: 3.2 g/dL (calc) (ref 1.9–3.7)
Glucose, Bld: 115 mg/dL — ABNORMAL HIGH (ref 65–99)
Potassium: 4.2 mmol/L (ref 3.5–5.3)
Sodium: 138 mmol/L (ref 135–146)
Total Bilirubin: 0.8 mg/dL (ref 0.2–1.2)
Total Protein: 7.1 g/dL (ref 6.1–8.1)

## 2020-02-22 LAB — CBC WITH DIFFERENTIAL/PLATELET
Absolute Monocytes: 451 cells/uL (ref 200–950)
Basophils Absolute: 31 cells/uL (ref 0–200)
Basophils Relative: 1.1 %
Eosinophils Absolute: 101 cells/uL (ref 15–500)
Eosinophils Relative: 3.6 %
HCT: 44.4 % (ref 38.5–50.0)
Hemoglobin: 14.9 g/dL (ref 13.2–17.1)
Lymphs Abs: 521 cells/uL — ABNORMAL LOW (ref 850–3900)
MCH: 27.9 pg (ref 27.0–33.0)
MCHC: 33.6 g/dL (ref 32.0–36.0)
MCV: 83.1 fL (ref 80.0–100.0)
MPV: 9.7 fL (ref 7.5–12.5)
Monocytes Relative: 16.1 %
Neutro Abs: 1697 cells/uL (ref 1500–7800)
Neutrophils Relative %: 60.6 %
Platelets: 225 10*3/uL (ref 140–400)
RBC: 5.34 10*6/uL (ref 4.20–5.80)
RDW: 14.6 % (ref 11.0–15.0)
Total Lymphocyte: 18.6 %
WBC: 2.8 10*3/uL — ABNORMAL LOW (ref 3.8–10.8)

## 2020-02-22 NOTE — Progress Notes (Signed)
Glucose is elevated-115.  LFTs are elevated. Please clarify if the patient has been taking NSAIDs, tylenol, or alcohol use.   WBC count is low. Lymphopenia noted.  Please advise patient to return for repeat lab work in 2 weeks. Please place future orders for CBC and CMP.

## 2020-02-22 NOTE — Telephone Encounter (Signed)
Contacted patient by phone and discussed lab results.

## 2020-03-01 ENCOUNTER — Ambulatory Visit (INDEPENDENT_AMBULATORY_CARE_PROVIDER_SITE_OTHER): Payer: Medicare HMO | Admitting: Gastroenterology

## 2020-03-01 ENCOUNTER — Encounter: Payer: Self-pay | Admitting: Gastroenterology

## 2020-03-01 ENCOUNTER — Other Ambulatory Visit: Payer: Self-pay

## 2020-03-01 VITALS — BP 130/70 | HR 84 | Temp 98.5°F | Ht 62.0 in | Wt 144.0 lb

## 2020-03-01 DIAGNOSIS — K5900 Constipation, unspecified: Secondary | ICD-10-CM

## 2020-03-01 DIAGNOSIS — R1030 Lower abdominal pain, unspecified: Secondary | ICD-10-CM

## 2020-03-01 DIAGNOSIS — R7989 Other specified abnormal findings of blood chemistry: Secondary | ICD-10-CM | POA: Diagnosis not present

## 2020-03-01 DIAGNOSIS — K2100 Gastro-esophageal reflux disease with esophagitis, without bleeding: Secondary | ICD-10-CM

## 2020-03-01 MED ORDER — PANTOPRAZOLE SODIUM 40 MG PO TBEC: 40.0000 mg | DELAYED_RELEASE_TABLET | Freq: Every day | ORAL | 3 refills | Status: DC

## 2020-03-01 NOTE — Progress Notes (Signed)
    History of Present Illness: This is a 79 year old male referred by Harland Dingwall L, NP-C for the evaluation of lower abdominal pain and constipation.  He states the problems began after he had an inguinal hernia repair in 2017 and have been persistent ever since.  He relates a dull aching sensation in his suprapubic area and sometimes a pulling sensation.  It has not increased in frequency or severity over the past few years.  He also relates difficulty with an overactive bladder and he was started on Myrbetriq. He relates ongoing problems with acid reflux typically a few time per week.  Typically has a bowel movement every 2 to 3 days.  This does not seem to have any relation to his suprapubic pain.  Colonoscopy February 2017. 1. Two sessile polyps in the transverse colon and ascending colon; polypectomies performed with a cold snare 2. Moderate diverticulosis in the descending colon, transverse colon, and ascending colon 3. Prior surgical anastomosis in the left colon 4. 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 Grade l internal hemorrhoids  EGD January 2017 1. LA Class B esophagitis 2. Gastritis in the gastric body and gastric antrum; multiple biopsies performed 3. Small hiatal hernia 4. Single non-bleeding ulcer in the duodenal bulb    Review of Systems: Pertinent positive and negative review of systems were noted in the above HPI section. All other review of systems were otherwise negative.   Physical Exam: General: Well developed, well nourished, no acute distress Head: Normocephalic and atraumatic Eyes:  sclerae anicteric, EOMI Ears: Normal auditory acuity Mouth: Not examined, mask on during Covid-19 pandemic Neck: Supple, no masses or thyromegaly Lungs: Clear throughout to auscultation Heart: Regular rate and rhythm; no murmurs, rubs or bruits Abdomen: Soft, minimal suprapubic tenderness and non distended. No masses, hepatosplenomegaly or hernias noted. Normal Bowel  sounds Rectal: Not done Musculoskeletal: Symmetrical with no gross deformities  Skin: No lesions on visible extremities Pulses:  Normal pulses noted Extremities: No clubbing, cyanosis, edema or deformities noted Neurological: Alert oriented x 4, grossly nonfocal Cervical Nodes:  No significant cervical adenopathy Inguinal Nodes: No significant inguinal adenopathy Psychological:  Alert and cooperative. Normal mood and affect   Assessment and Recommendations:  1.  Lower abdominal pain, suprapubic tenderess and constipation.  Symptoms could be GU related.  Rule out gastrointestinal etiologies.  Schedule CT AP.  Begin MiraLAX once or twice daily titrated for complete bowel movement daily.  Adequate hydration and a high-fiber diet.  2.  GERD with a history of LA class B erosive esophagitis.  Begin pantoprazole 40 mg daily.  Follow standard antireflux measures.  3.  Personal history of adenomatous colon polyps.  A 5-year interval surveillance colonoscopy was initially recommended for 2022 however given his age we will not plan for future surveillance colonoscopies.  4.  Elevated LFTs.  Mildly elevated transaminases noted on March 1 with previous LFTs entirely normal.  CT AP as above. Repeat LFTs in 2 months.    cc: Girtha Rm, NP-C [place],  [place] [id number]

## 2020-03-01 NOTE — Patient Instructions (Signed)
We have sent the following medications to your pharmacy for you to pick up at your convenience: Pantoprazole  Miralax- dissolve 17 grams in at least 8oz of water/juice daily as needed for constipation.   You have been scheduled for a CT scan of the abdomen and pelvis at [place] are scheduled on 03/08/20 at 10:00am. You should arrive 15 minutes prior to your appointment time for registration. Please follow the written instructions below on the day of your exam:  WARNING: IF YOU ARE ALLERGIC TO IODINE/X-RAY DYE, PLEASE NOTIFY RADIOLOGY IMMEDIATELY AT [phone]! YOU WILL BE GIVEN A 13 HOUR PREMEDICATION PREP.  1) Do not eat or drink anything after 6:00am (4 hours prior to your test) 2) You have been given 2 bottles of oral contrast to drink. The solution may taste better if refrigerated, but do NOT add ice or any other liquid to this solution. Shake well before drinking.    Drink 1 bottle of contrast @ 8:00am (2 hours prior to your exam)  Drink 1 bottle of contrast @ 9:00am (1 hour prior to your exam)  You may take any medications as prescribed with a small amount of water, if necessary. If you take any of the following medications: METFORMIN, GLUCOPHAGE, GLUCOVANCE, AVANDAMET, RIOMET, FORTAMET, [place] MET, JANUMET, GLUMETZA or METAGLIP, you MAY be asked to HOLD this medication 48 hours AFTER the exam.  The purpose of you drinking the oral contrast is to aid in the visualization of your intestinal tract. The contrast solution may cause some diarrhea. Depending on your individual set of symptoms, you may also receive an intravenous injection for 30 minutes or longer, depending on the type of exam you are having performed.  This test typically takes 30-45 minutes to complete.  If you have any questions regarding your exam or if you need to reschedule, you may call the CT department at [phone] between the hours of 8:00 am and 5:00 pm,  Monday-Friday.  ________________________________________________________________________  Thank you for choosing me and [facility].  Dr. Fuller Plan

## 2020-03-08 ENCOUNTER — Telehealth: Payer: Self-pay

## 2020-03-08 ENCOUNTER — Other Ambulatory Visit: Payer: Self-pay

## 2020-03-08 ENCOUNTER — Ambulatory Visit (HOSPITAL_COMMUNITY)
Admission: RE | Admit: 2020-03-08 | Discharge: 2020-03-08 | Disposition: A | Discharge status: Home / Self Care | Payer: Medicare HMO | Source: Ambulatory Visit | Attending: Gastroenterology | Admitting: Gastroenterology

## 2020-03-08 DIAGNOSIS — K5900 Constipation, unspecified: Secondary | ICD-10-CM | POA: Insufficient documentation

## 2020-03-08 DIAGNOSIS — R1030 Lower abdominal pain, unspecified: Secondary | ICD-10-CM | POA: Insufficient documentation

## 2020-03-08 DIAGNOSIS — K2100 Gastro-esophageal reflux disease with esophagitis, without bleeding: Secondary | ICD-10-CM | POA: Insufficient documentation

## 2020-03-08 DIAGNOSIS — R7989 Other specified abnormal findings of blood chemistry: Secondary | ICD-10-CM | POA: Insufficient documentation

## 2020-03-08 MED ORDER — IOHEXOL 300 MG/ML  SOLN
100.0000 mL | Freq: Once | INTRAMUSCULAR | Status: AC | PRN
  Administered 2020-03-08: 100 mL via INTRAVENOUS

## 2020-03-08 NOTE — Telephone Encounter (Signed)
Called patient and gave CT results, also let him know Dr Fuller Plan would like him to contact his PCP and Urologist about items 4 and 5. Asked him to continue his Pantoprazole-40mg  daily and that we would plan to recheck his LFTs in 2 months

## 2020-03-09 ENCOUNTER — Other Ambulatory Visit: Payer: Self-pay

## 2020-03-09 DIAGNOSIS — R7989 Other specified abnormal findings of blood chemistry: Secondary | ICD-10-CM

## 2020-03-10 ENCOUNTER — Other Ambulatory Visit: Payer: Self-pay

## 2020-03-10 ENCOUNTER — Ambulatory Visit (INDEPENDENT_AMBULATORY_CARE_PROVIDER_SITE_OTHER): Payer: Medicare HMO | Admitting: Family Medicine

## 2020-03-10 ENCOUNTER — Encounter: Payer: Self-pay | Admitting: Family Medicine

## 2020-03-10 VITALS — BP 118/64 | HR 89 | Temp 98.0°F | Wt 148.0 lb

## 2020-03-10 DIAGNOSIS — N3289 Other specified disorders of bladder: Secondary | ICD-10-CM | POA: Insufficient documentation

## 2020-03-10 DIAGNOSIS — R0989 Other specified symptoms and signs involving the circulatory and respiratory systems: Secondary | ICD-10-CM

## 2020-03-10 DIAGNOSIS — I359 Nonrheumatic aortic valve disorder, unspecified: Secondary | ICD-10-CM

## 2020-03-10 DIAGNOSIS — I2510 Atherosclerotic heart disease of native coronary artery without angina pectoris: Secondary | ICD-10-CM | POA: Diagnosis not present

## 2020-03-10 DIAGNOSIS — R002 Palpitations: Secondary | ICD-10-CM | POA: Diagnosis not present

## 2020-03-10 DIAGNOSIS — N400 Enlarged prostate without lower urinary tract symptoms: Secondary | ICD-10-CM | POA: Diagnosis not present

## 2020-03-10 HISTORY — DX: Benign prostatic hyperplasia without lower urinary tract symptoms: N40.0

## 2020-03-10 HISTORY — DX: Atherosclerotic heart disease of native coronary artery without angina pectoris: I25.10

## 2020-03-10 HISTORY — DX: Other specified disorders of bladder: N32.89

## 2020-03-10 HISTORY — DX: Nonrheumatic aortic valve disorder, unspecified: I35.9

## 2020-03-10 NOTE — Patient Instructions (Addendum)
Please call and schedule with your urologist at [facility] [phone] as discussed. Let them know you had some new findings on a cat scan which was done for chronic lower abdominal pain.   We are scheduling you to see your cardiologist for the new findings regarding your coronary artery calcifications.   If you have chest pain or feel that your heart is beating differently before you can see your cardiologist, then you will need to go the the emergency department or call 911.

## 2020-03-10 NOTE — Progress Notes (Signed)
   Subjective:    Patient ID: Tony Mcmillan, male    DOB: 08-13-1941, 79 y.o.   MRN: [id number]  HPI Chief Complaint  Patient presents with  . CT findings    CT findings   Here to follow up on abnormal findings on CT abdomen which was ordered by Dr. Fuller Plan for chronic lower abdominal pain.   Denies any new or worsening symptoms today.   He also has a history of intermittent chest pain and palpitations. Asymptomatic today.  Needs follow up with cardiology.   Having urinary symptoms. States he does not feel like he is emptying his bladder. Needs follow up with his urologist.   Findings are below:  IMPRESSION: 1. No CT findings in the abdomen or pelvis to explain lower abdominal pain. 2. Interval findings of right inguinal hernia repair. No evidence of recurrent hernia. 3. Pancolonic diverticulosis without evidence of diverticulitis. 4. Prostatomegaly. Thickening of the urinary bladder likely related to chronic outlet obstruction. 5. Three-vessel coronary artery calcifications. Aortic valve calcifications. Aortic Atherosclerosis (ICD10-I70.0).  Electronically Signed   By: Eddie Candle M.D.   On: 03/08/2020 13:17   States he received his Covid vaccines. Last one 3 weeks ago.    Review of Systems Pertinent positives and negatives in the history of present illness.     Objective:   Physical Exam BP 118/64   Pulse 89   Temp 98 F (36.7 C)   Wt 148 lb (67.1 kg)   BMI 27.07 kg/m   Alert and in no distress. Cardiac exam shows a regular rate and rhythm.  Lungs are clear to auscultation. Respirations unlabored. Extremities without edema.       Assessment & Plan:  3-vessel coronary artery disease  Aortic valve calcification  Enlarged prostate  Intermittent palpitations  Bilateral carotid bruits  Bladder wall thickening  Reviewed CT report with patient and a cardiology appointment was scheduled for next week. He will call and schedule with his urologist as well.   If he develops any cardiac symptoms, he will call 911 or go to the ED.  Follow up with me for AWV

## 2020-03-14 NOTE — Progress Notes (Signed)
Cardiology Office Note:    Date:  03/15/2020   ID:  Tony Mcmillan, DOB [birth year], MRN [id number]  PCP:  Girtha Rm, NP-C  Cardiologist:  Jenkins Rouge, MD   Electrophysiologist:  None   Referring MD: Girtha Rm, NP-C   Chief Complaint:  Coronary Artery Disease    Patient Profile:    Tony Mcmillan is a 79 y.o. male with:   Coronary artery disease   Coronary artery calcification, aortic atherosclerosis on Chest CT 11/2016, 02/2020  Emphysema  Chest pain   Myoview 09/2016: normal perfusion  Myoview 01/2019: no ischemia or scar, EF 58  GERD  Rheumatoid Arthritis   Prior CV studies: Event Monitor 02/16/2019 NSR, PACs  Myoview 02/16/2019 EF 76, no ischemia or infarction  Carotid US 02/15/2019 No ICA stenosis   Myoview 10/14/16 EF 63, normal perfusion; Low Risk  Echocardiogram 10/14/16 Mild focal basal septal hypertrophy, EF 55-60, no RWMA, Gr 1 DD, trivial MR, normal RVSF, mild TR, PASP 28   History of Present Illness:    Tony Mcmillan has been seen intermittently in our office by one of our APPs.  He has been assigned to Dr. Johnsie Cancel but has never actually seen him in the office.  He was last sen via Telemedicine by Ermalinda Barrios, PA-C in 02/2019.  He had previously been set up for a Myoview and event monitor.  His Myoview was normal and his monitor just showed some PACs.  He was to follow up with Cardiology/Dr. Johnsie Cancel as need.    He was recently seen by GI for abdominal pain.  A CT was obtained and was noted to demonstrate 3 vessel coronary artery calcification.  He was asked to follow up with Cardiology.  Today, he is here alone.  He has occasional chest discomfort.  This is left-sided.  It does not necessarily occur with exertion.  It is fairly brief only lasting minutes.  He does not have any radiating symptoms, associated shortness of breath, nausea or diaphoresis.  He has not had syncope.  He has not had orthopnea or lower extremity swelling.     Past Medical  History:  Diagnosis Date  . Anemia   . Arthritis   . Atypical chest pain    a. 09/2016 MV: EF 63%, normal perfusion.  Marland Kitchen CAD (coronary artery disease)    Coronary artery calcification on Chest CT in 09/2016 and 01/2019 // Myoview 10/17: normal perfusion // Myoview 01/2019: no ischemia or infarction, EF 58  . Carotid bruit    Carotid Dopplers 01/2019: no ICA stenosis bilaterally   . COVID-19   . Diastolic dysfunction    a. 2015 Echo: EF >55%;  b. 09/2016 Echo: EF 55-60%, no rwma, Gr1 DD, triv AI/MR, mild TR, PASP 34mmHg.  . Emphysema of lung ([place]) 02/08/2016  . GERD (gastroesophageal reflux disease)   . History of systemic steroid therapy 10/30/2017  . Hx of Blood Transfusion   . Palpitations    Echo 10/17: Mild focal basal septal hypertrophy, EF 55-60, no RWMA, Gr 1 DD, trivial MR, normal RVSF, mild TR, PASP 28 // Event monitor 01/2019: NSR, PACs  . Rheumatoid arthritis ([employer])   . Tubular adenoma of colon 01/2016    Current Medications: Current Meds  Medication Sig  . atorvastatin (LIPITOR) 10 MG tablet Take 1 tablet (10 mg total) by mouth daily.  . Calcium Carb-Cholecalciferol (CALCIUM-VITAMIN D3) 600-400 MG-UNIT TABS Take by mouth.  . Glycopyrrolate-Formoterol (BEVESPI AEROSPHERE) 9-4.8 MCG/ACT AERO Inhale 2 puffs into the lungs  2 (two) times daily.  Marland Kitchen MYRBETRIQ 50 MG TB24 tablet Take 50 mg by mouth daily.   . pantoprazole (PROTONIX) 40 MG tablet Take 1 tablet (40 mg total) by mouth daily.     Allergies:   Patient has no known allergies.   Social History   Tobacco Use  . Smoking status: Former Smoker    Packs/day: 1.00    Years: 17.00    Pack years: 17.00    Types: Cigarettes    Quit date: 12/23/1993    Years since quitting: 26.2  . Smokeless tobacco: Never Used  Substance Use Topics  . Alcohol use: No    Alcohol/week: 0.0 standard drinks  . Drug use: No     Family Hx: The patient's family history includes Diabetes in his brother, father, and sister; Heart disease in his  brother and father; Hyperlipidemia in his sister; Hypertension in his father and mother; Stroke in his brother. There is no history of Colon polyps, Esophageal cancer, Stomach cancer, Rectal cancer, or Colon cancer.  ROS   EKGs/Labs/Other Test Reviewed:    EKG:  EKG is   ordered today.  The ekg ordered today demonstrates normal sinus rhythm, heart rate 77, normal axis, possible anteroseptal Q waves, no ST-T wave changes, QTC 398  Recent Labs: 02/21/2020: ALT 55; BUN 14; Creat 1.13; Hemoglobin 14.9; Platelets 225; Potassium 4.2; Sodium 138   Recent Lipid Panel Lab Results  Component Value Date   CHOL 179 09/23/2019   TRIG 158 (H) 09/23/2019   HDL 56 09/23/2019   [place] 98 09/23/2019     Physical Exam:    VS:  BP 128/76   Pulse 77   Ht 5\' 2"  (1.575 m)   Wt 146 lb 12.8 oz (66.6 kg)   SpO2 97%   BMI 26.85 kg/m     Wt Readings from Last 3 Encounters:  03/15/20 146 lb 12.8 oz (66.6 kg)  03/10/20 148 lb (67.1 kg)  03/01/20 144 lb (65.3 kg)     Constitutional:      Appearance: Healthy appearance. Not in distress.  Neck:     Thyroid: Thyroid normal.     Vascular: JVD normal.  Pulmonary:     Effort: Pulmonary effort is normal.     Breath sounds: No wheezing. No rales.  Cardiovascular:     Normal rate. Regular rhythm. Normal S1. Normal S2.     Murmurs: There is no murmur.  Pulses:    Intact distal pulses.  Edema:    Peripheral edema absent.  Abdominal:     Palpations: Abdomen is soft. There is no hepatomegaly.  Skin:    General: Skin is warm and dry.  Neurological:     General: No focal deficit present.     Mental Status: Alert and oriented to person, place and time.     Cranial Nerves: Cranial nerves are intact.       ASSESSMENT & PLAN:    1. Coronary artery disease involving native coronary artery of native heart without angina pectoris 2. Precordial chest pain He has been evaluated several times in the past for chest discomfort.  He had a Myoview in 2017 as  well as February 2020.  These were both negative for ischemia.  Recent CT does demonstrate three-vessel coronary calcification.  He has a family history of CAD as well as a prior history of smoking.  We discussed the rationale for coronary CTA versus nuclear stress test.  Given his risk factors and prior history, I  have recommended proceeding with coronary CTA to definitively rule out obstructive coronary disease.  He is agreeable with this plan.  -Arrange coronary CTA with FFR  -Continue statin therapy  -Start aspirin 81 mg daily  -Follow-up with me in 3 months  3. Mixed hyperlipidemia Recent LDL 98.  I recommend pursuing a goal LDL of <70.  However, he recently had elevated LFTs.  I would hold off on adjusting his statin therapy until his LFTs are repeated.  We could consider changing his atorvastatin to rosuvastatin or pravastatin.   Dispo:  Return in about 3 months (around 06/15/2020) for Routine Follow Up, w/ Richardson Dopp, PA-C, in person.   Medication Adjustments/Labs and Tests Ordered: Current medicines are reviewed at length with the patient today.  Concerns regarding medicines are outlined above.  Tests Ordered: Orders Placed This Encounter  Procedures  . CT CORONARY MORPH W/CTA COR W/SCORE W/CA W/CM &/OR WO/CM  . CT CORONARY FRACTIONAL FLOW RESERVE DATA PREP  . CT CORONARY FRACTIONAL FLOW RESERVE FLUID ANALYSIS  . EKG 12-Lead   Medication Changes: Meds ordered this encounter  Medications  . aspirin EC 81 MG tablet    Sig: Take 1 tablet (81 mg total) by mouth daily.    Dispense:  90 tablet    Refill:  3  . metoprolol tartrate (LOPRESSOR) 50 MG tablet    Sig: Take 1 tablet (50 mg total) by mouth once for 1 dose. TAKE TWO HOURS BEFORE TEST    Dispense:  1 tablet    Refill:  0    Signed, Richardson Dopp, PA-C  03/15/2020 4:37 PM    [facility] [address], [place], [place]  [address] Phone: [phone]; Fax: [phone]

## 2020-03-15 ENCOUNTER — Other Ambulatory Visit: Payer: Self-pay

## 2020-03-15 ENCOUNTER — Encounter: Payer: Self-pay | Admitting: Physician Assistant

## 2020-03-15 ENCOUNTER — Ambulatory Visit (INDEPENDENT_AMBULATORY_CARE_PROVIDER_SITE_OTHER): Payer: Medicare HMO | Admitting: Physician Assistant

## 2020-03-15 VITALS — BP 128/76 | HR 77 | Ht 62.0 in | Wt 146.8 lb

## 2020-03-15 DIAGNOSIS — R072 Precordial pain: Secondary | ICD-10-CM

## 2020-03-15 DIAGNOSIS — E782 Mixed hyperlipidemia: Secondary | ICD-10-CM | POA: Diagnosis not present

## 2020-03-15 DIAGNOSIS — I2510 Atherosclerotic heart disease of native coronary artery without angina pectoris: Secondary | ICD-10-CM | POA: Diagnosis not present

## 2020-03-15 MED ORDER — ASPIRIN EC 81 MG PO TBEC: 81.0000 mg | DELAYED_RELEASE_TABLET | Freq: Every day | ORAL | 3 refills | Status: DC

## 2020-03-15 MED ORDER — METOPROLOL TARTRATE 50 MG PO TABS: 50.0000 mg | ORAL_TABLET | Freq: Once | ORAL | 0 refills | Status: DC

## 2020-03-15 NOTE — Patient Instructions (Addendum)
Medication Instructions:  Your physician has recommended you make the following change in your medication:  1 START ASPRIN 81 MG DAILY.   *If you need a refill on your cardiac medications before your next appointment, please call your pharmacy*   Lab Work: NONE If you have labs (blood work) drawn today and your tests are completely normal, you will receive your results only by: Marland Kitchen MyChart Message (if you have MyChart) OR . A paper copy in the mail If you have any lab test that is abnormal or we need to change your treatment, we will call you to review the results.   Testing/Procedures: CORONARY CT-A TO BE DONE AT [facility].  Follow-Up: At [facility], you and your health needs are our priority.  As part of our continuing mission to provide you with exceptional heart care, we have created designated Provider Care Teams.  These Care Teams include your primary Cardiologist (physician) and Advanced Practice Providers (APPs -  Physician Assistants and Nurse Practitioners) who all work together to provide you with the care you need, when you need it.  We recommend signing up for the patient portal called "MyChart".  Sign up information is provided on this After Visit Summary.  MyChart is used to connect with patients for Virtual Visits (Telemedicine).  Patients are able to view lab/test results, encounter notes, upcoming appointments, etc.  Non-urgent messages can be sent to your provider as well.   To learn more about what you can do with MyChart, go to [email or web address].    Your next appointment:   3 month(s)  The format for your next appointment:   In Person  Provider:   Richardson Dopp, PA-C   Other Instructions Your cardiac CT will be scheduled at one of the below locations:   [facility] [address] [place], [place] [address] [phone]   If scheduled at [facility], please arrive at the [facility] main entrance of [facility] 30 minutes prior to test start time. Proceed to the [facility] Radiology Department (first floor) to check-in and test prep.   Please follow these instructions carefully (unless otherwise directed):  Hold all erectile dysfunction medications at least 3 days (72 hrs) prior to test.  On the Night Before the Test: . Be sure to Drink plenty of water. . Do not consume any caffeinated/decaffeinated beverages or chocolate 12 hours prior to your test. . Do not take any antihistamines 12 hours prior to your test. . If you take Metformin do not take 24 hours prior to test.  On the Day of the Test: . Drink plenty of water. Do not drink any water within one hour of the test. . Do not eat any food 4 hours prior to the test. . You may take your regular medications prior to the test.  . Take metoprolol (Lopressor) two hours prior to test.      After the Test: . Drink plenty of water. . After receiving IV contrast, you may experience a mild flushed feeling. This is normal. . On occasion, you may experience a mild rash up to 24 hours after the test. This is not dangerous. If this occurs, you can take Benadryl 25 mg and increase your fluid intake. . If you experience trouble breathing, this can be serious. If it is severe call 911 IMMEDIATELY. If it is mild, please call our office. . If you take any of these medications: Glipizide/Metformin, Avandament, Glucavance, please do not take  48 hours after completing test unless otherwise instructed.

## 2020-04-07 ENCOUNTER — Other Ambulatory Visit: Payer: Self-pay

## 2020-04-07 ENCOUNTER — Ambulatory Visit (INDEPENDENT_AMBULATORY_CARE_PROVIDER_SITE_OTHER): Payer: Medicare HMO | Admitting: Family Medicine

## 2020-04-07 ENCOUNTER — Encounter: Payer: Self-pay | Admitting: Family Medicine

## 2020-04-07 VITALS — BP 130/70 | HR 85 | Temp 97.9°F | Ht 63.5 in | Wt 149.0 lb

## 2020-04-07 DIAGNOSIS — R7303 Prediabetes: Secondary | ICD-10-CM

## 2020-04-07 DIAGNOSIS — Z7189 Other specified counseling: Secondary | ICD-10-CM | POA: Diagnosis not present

## 2020-04-07 DIAGNOSIS — Z8616 Personal history of COVID-19: Secondary | ICD-10-CM | POA: Diagnosis not present

## 2020-04-07 DIAGNOSIS — M069 Rheumatoid arthritis, unspecified: Secondary | ICD-10-CM | POA: Diagnosis not present

## 2020-04-07 DIAGNOSIS — J438 Other emphysema: Secondary | ICD-10-CM

## 2020-04-07 DIAGNOSIS — I2510 Atherosclerotic heart disease of native coronary artery without angina pectoris: Secondary | ICD-10-CM | POA: Diagnosis not present

## 2020-04-07 DIAGNOSIS — Z0000 Encounter for general adult medical examination without abnormal findings: Secondary | ICD-10-CM

## 2020-04-07 DIAGNOSIS — N400 Enlarged prostate without lower urinary tract symptoms: Secondary | ICD-10-CM

## 2020-04-07 DIAGNOSIS — E559 Vitamin D deficiency, unspecified: Secondary | ICD-10-CM

## 2020-04-07 DIAGNOSIS — Z125 Encounter for screening for malignant neoplasm of prostate: Secondary | ICD-10-CM

## 2020-04-07 NOTE — Progress Notes (Addendum)
Tony Mcmillan is a 79 y.o. male who presents for annual wellness visit, CPE and follow-up on chronic medical conditions.  He has the following concerns:  None     Immunization History  Administered Date(s) Administered  . Influenza, High Dose Seasonal PF 07/23/2017, 08/05/2019  . Influenza,inj,Quad PF,6+ Mos 08/01/2017  . Influenza-Unspecified 10/30/2015, 09/13/2016, 07/30/2018, 08/05/2019  . PFIZER SARS-COV-2 Vaccination 01/28/2020, 02/18/2020  . Pneumococcal Conjugate-13 12/05/2015  . Pneumococcal Polysaccharide-23 04/23/2017  . Tdap 12/09/2015  . Zoster 12/09/2015   Last colonoscopy: 01/2016 Last PSA: 2019 Dentist:  dentures Ophtho: every year Exercise: some  Other doctors caring for patient include: Dr. Kathlen Mody- Cardiology Dr. Fuller Plan- GI Dr. Estanislado Pandy- Rheumatology Dr. Chase Caller -pulmonology [facility].   Denies ever being on oral steroids for a long period of time and not sure why this is in his chart.    Depression screen:  See questionnaire below.     Depression screen [facility] 2/9 04/07/2020 08/03/2018 06/12/2018 04/23/2017 04/22/2017  Decreased Interest 0 0 0 0 0  Down, Depressed, Hopeless 0 0 0 0 0  PHQ - 2 Score 0 0 0 0 0    Fall Screen: See Questionaire below.   Fall Risk  04/07/2020 11/16/2019 08/03/2018 07/24/2018 06/12/2018  Falls in the past year? 0 0 No No No  Comment - Emmi Telephone Survey: data to providers prior to load - Emmi Telephone Survey: data to providers prior to load -  Number falls in past yr: 0 - - - -  Risk for fall due to : - - - - -  Risk for fall due to: Comment - - - - -    ADL screen:  See questionnaire below.  Functional Status Survey: Is the patient deaf or have difficulty hearing?: No Does the patient have difficulty seeing, even when wearing glasses/contacts?: No Does the patient have difficulty concentrating, remembering, or making decisions?: No Does the patient have difficulty walking or climbing stairs?: Yes Does the patient  have difficulty dressing or bathing?: No Does the patient have difficulty doing errands alone such as visiting a doctor's office or shopping?: No   End of Life Discussion:  Patient does not have a living will and medical power of attorney. MOST form reviewed with patient. Forms for living will and HCPOA sent home with patient. Tony Mcmillan his son or Tony Mcmillan his daughter will make his decisions for him.     Review of Systems  Constitutional: -fever, -chills, -sweats, -unexpected weight change, -anorexia, -fatigue Allergy: -sneezing, -itching, -congestion Dermatology: denies changing moles, rash, lumps, new worrisome lesions ENT: -runny nose, -ear pain, -sore throat, -hoarseness, -sinus pain, -teeth pain, -tinnitus, -hearing loss, -epistaxis Cardiology:  -chest pain, -palpitations, -edema, -orthopnea, -paroxysmal nocturnal dyspnea Respiratory: -cough, -shortness of breath, -dyspnea on exertion, -wheezing, -hemoptysis Gastroenterology: -abdominal pain, -nausea, -vomiting, -diarrhea, -constipation, -blood in stool, -changes in bowel movement, -dysphagia Hematology: -bleeding or bruising problems Musculoskeletal: -arthralgias, -myalgias, -joint swelling, -back pain, -neck pain, -cramping, -gait changes Ophthalmology: -vision changes, -eye redness, -itching, -discharge Urology: -dysuria, -difficulty urinating, -hematuria, -urinary frequency, -urgency, incontinence Neurology: -headache, -weakness, -tingling, -numbness, -speech abnormality, -memory loss, -falls, -dizziness Psychology:  -depressed mood, -agitation, -sleep problems   PHYSICAL EXAM:  BP 130/70   Pulse 85   Temp 97.9 F (36.6 C)   Ht 5' 3.5" (1.613 m)   Wt 149 lb (67.6 kg)   BMI 25.98 kg/m   General Appearance: Alert, cooperative, no distress, appears stated age Head: Normocephalic, without obvious abnormality, atraumatic Eyes: PERRL, conjunctiva/corneas  clear, EOM's intact Ears: Normal TM's and external ear  canals Nose: mask in place  Throat: mask in place  Neck: Supple, no lymphadenopathy, thyroid:no enlargement/tenderness/nodules Back: Spine nontender, no curvature, ROM normal, no CVA tenderness Lungs: Clear to auscultation bilaterally without wheezes, rales or ronchi; respirations unlabored Chest Wall: No tenderness or deformity Heart: Regular rate and rhythm Breast Exam: No chest wall tenderness, masses or gynecomastia Abdomen: Soft, non-tender, nondistended, normoactive bowel sounds, no masses, no hepatosplenomegaly Genitalia: declines   Extremities: No clubbing, cyanosis or edema Pulses: 2+ and symmetric all extremities Skin: Skin color, texture, turgor normal, no rashes or lesions Lymph nodes: Cervical, supraclavicular, and axillary nodes normal Neurologic: CNII-XII intact, normal strength, sensation and gait; reflexes 2+ and symmetric throughout   Psych: Normal mood, affect, hygiene and grooming  ASSESSMENT/PLAN: Medicare annual wellness visit, subsequent -denies falls, depression, difficulties with ADLs or memory concerns. Doing well physically and emotionally.   Routine general medical examination at a health care facility - Plan: CBC with Differential/Platelet, Comprehensive metabolic panel -discussed preventive health care. Immunizations and safety discussed. Counseling on healthy lifestyle.   Rheumatoid arthritis involving multiple sites, unspecified whether rheumatoid factor present [facility]) -is not currently taking medication but has not consulted with his rheumatologist. Encouraged him to schedule follow up.   Prediabetes - Plan: Hemoglobin A1c -counseling on diet. Follow up pending labs.   History of 2019 novel coronavirus disease (COVID-19)  3-vessel coronary artery disease - Plan: Lipid panel -under the care of cardiology. Plans to schedule coronary CT  Other emphysema [facility]) -doing well currently. Sees pulmonology   Screening for prostate cancer - Plan: PSA -done  per request. Has not seen urology in a while. Asymptomatic   Benign prostatic hyperplasia without lower urinary tract symptoms - Plan: PSA -asymptomatic. Refer back to urology as needed.   Vitamin D deficiency - Plan: VITAMIN D 25 Hydroxy (Vit-D Deficiency, Fractures) -is taking a supplement. Adjust dose as needed   Advance directive discussed with patient -MOST form filled out. Advance directives sent home     Discussed PSA screening (risks/benefits), recommended at least 30 minutes of aerobic activity at least 5 days/week; proper sunscreen use reviewed; healthy diet and alcohol recommendations (less than or equal to 2 drinks/day) reviewed; regular seatbelt use; changing batteries in smoke detectors. Immunization recommendations discussed.  Colonoscopy recommendations reviewed.   Medicare Attestation I have personally reviewed: The patient's medical and social history Their use of alcohol, tobacco or illicit drugs Their current medications and supplements The patient's functional ability including ADLs,fall risks, home safety risks, cognitive, and hearing and visual impairment Diet and physical activities Evidence for depression or mood disorders  The patient's weight, height, and BMI have been recorded in the chart.  I have made referrals, counseling, and provided education to the patient based on review of the above and I have provided the patient with a written personalized care plan for preventive services.     Harland Dingwall, NP-C   04/07/2020

## 2020-04-07 NOTE — Patient Instructions (Addendum)
Tony Mcmillan , Thank you for taking time to come for your Medicare Wellness Visit. I appreciate your ongoing commitment to your health goals. Please review the following plan we discussed and let me know if I can assist you in the future.   These are the goals we discussed:  Call and follow up with your rheumatologist as recommended.   Call and schedule your coronary CT as recommended.   Eat a healthy diet and continue being as active.   We will be in touch with your results.    This is a list of the screening recommended for you and due dates:  Health Maintenance  Topic Date Due  . Flu Shot  07/23/2020  . Colon Cancer Screening  02/19/2021  . Tetanus Vaccine  12/08/2025  . Pneumonia vaccines  Completed    Preventive Care 20 Years and Older, Male Preventive care refers to lifestyle choices and visits with your health care provider that can promote health and wellness. This includes:  A yearly physical exam. This is also called an annual well check.  Regular dental and eye exams.  Immunizations.  Screening for certain conditions.  Healthy lifestyle choices, such as diet and exercise. What can I expect for my preventive care visit? Physical exam Your health care provider will check:  Height and weight. These may be used to calculate body mass index (BMI), which is a measurement that tells if you are at a healthy weight.  Heart rate and blood pressure.  Your skin for abnormal spots. Counseling Your health care provider may ask you questions about:  Alcohol, tobacco, and drug use.  Emotional well-being.  Home and relationship well-being.  Sexual activity.  Eating habits.  History of falls.  Memory and ability to understand (cognition).  Work and work Statistician. What immunizations do I need?  Influenza (flu) vaccine  This is recommended every year. Tetanus, diphtheria, and pertussis (Tdap) vaccine  You may need a Td booster every 10 years. Varicella  (chickenpox) vaccine  You may need this vaccine if you have not already been vaccinated. Zoster (shingles) vaccine  You may need this after age 18. Pneumococcal conjugate (PCV13) vaccine  One dose is recommended after age 1. Pneumococcal polysaccharide (PPSV23) vaccine  One dose is recommended after age 78. Measles, mumps, and rubella (MMR) vaccine  You may need at least one dose of MMR if you were born in 1957 or later. You may also need a second dose. Meningococcal conjugate (MenACWY) vaccine  You may need this if you have certain conditions. Hepatitis A vaccine  You may need this if you have certain conditions or if you travel or work in places where you may be exposed to hepatitis A. Hepatitis B vaccine  You may need this if you have certain conditions or if you travel or work in places where you may be exposed to hepatitis B. Haemophilus influenzae type b (Hib) vaccine  You may need this if you have certain conditions. You may receive vaccines as individual doses or as more than one vaccine together in one shot (combination vaccines). Talk with your health care provider about the risks and benefits of combination vaccines. What tests do I need? Blood tests  Lipid and cholesterol levels. These may be checked every 5 years, or more frequently depending on your overall health.  Hepatitis C test.  Hepatitis B test. Screening  Lung cancer screening. You may have this screening every year starting at age 75 if you have a 30-pack-year history  of smoking and currently smoke or have quit within the past 15 years.  Colorectal cancer screening. All adults should have this screening starting at age 60 and continuing until age 84. Your health care provider may recommend screening at age 83 if you are at increased risk. You will have tests every 1-10 years, depending on your results and the type of screening test.  Prostate cancer screening. Recommendations will vary depending on  your family history and other risks.  Diabetes screening. This is done by checking your blood sugar (glucose) after you have not eaten for a while (fasting). You may have this done every 1-3 years.  Abdominal aortic aneurysm (AAA) screening. You may need this if you are a current or former smoker.  Sexually transmitted disease (STD) testing. Follow these instructions at home: Eating and drinking  Eat a diet that includes fresh fruits and vegetables, whole grains, lean protein, and low-fat dairy products. Limit your intake of foods with high amounts of sugar, saturated fats, and salt.  Take vitamin and mineral supplements as recommended by your health care provider.  Do not drink alcohol if your health care provider tells you not to drink.  If you drink alcohol: ? Limit how much you have to 0-2 drinks a day. ? Be aware of how much alcohol is in your drink. In the [place] one drink equals one 12 oz bottle of beer (355 mL), one 5 oz glass of wine (148 mL), or one 1 oz glass of hard liquor (44 mL). Lifestyle  Take daily care of your teeth and gums.  Stay active. Exercise for at least 30 minutes on 5 or more days each week.  Do not use any products that contain nicotine or tobacco, such as cigarettes, e-cigarettes, and chewing tobacco. If you need help quitting, ask your health care provider.  If you are sexually active, practice safe sex. Use a condom or other form of protection to prevent STIs (sexually transmitted infections).  Talk with your health care provider about taking a low-dose aspirin or statin. What's next?  Visit your health care provider once a year for a well check visit.  Ask your health care provider how often you should have your eyes and teeth checked.  Stay up to date on all vaccines. This information is not intended to replace advice given to you by your health care provider. Make sure you discuss any questions you have with your health care provider. Document  Revised: 12/03/2018 Document Reviewed: 12/03/2018 Elsevier Patient Education  2020 [employer].

## 2020-04-08 LAB — CBC WITH DIFFERENTIAL/PLATELET
Basophils Absolute: 0 10*3/uL (ref 0.0–0.2)
Basos: 1 %
EOS (ABSOLUTE): 0.1 10*3/uL (ref 0.0–0.4)
Eos: 2 %
Hematocrit: 42.3 % (ref 37.5–51.0)
Hemoglobin: 13.8 g/dL (ref 13.0–17.7)
Immature Grans (Abs): 0 10*3/uL (ref 0.0–0.1)
Immature Granulocytes: 0 %
Lymphocytes Absolute: 0.8 10*3/uL (ref 0.7–3.1)
Lymphs: 17 %
MCH: 27.8 pg (ref 26.6–33.0)
MCHC: 32.6 g/dL (ref 31.5–35.7)
MCV: 85 fL (ref 79–97)
Monocytes Absolute: 0.4 10*3/uL (ref 0.1–0.9)
Monocytes: 10 %
Neutrophils Absolute: 3.1 10*3/uL (ref 1.4–7.0)
Neutrophils: 70 %
Platelets: 245 10*3/uL (ref 150–450)
RBC: 4.97 x10E6/uL (ref 4.14–5.80)
RDW: 15.7 % — ABNORMAL HIGH (ref 11.6–15.4)
WBC: 4.4 10*3/uL (ref 3.4–10.8)

## 2020-04-08 LAB — COMPREHENSIVE METABOLIC PANEL
ALT: 17 IU/L (ref 0–44)
AST: 24 IU/L (ref 0–40)
Albumin/Globulin Ratio: 1.3 (ref 1.2–2.2)
Albumin: 4.1 g/dL (ref 3.7–4.7)
Alkaline Phosphatase: 66 IU/L (ref 39–117)
BUN/Creatinine Ratio: 10 (ref 10–24)
BUN: 13 mg/dL (ref 8–27)
Bilirubin Total: 0.6 mg/dL (ref 0.0–1.2)
CO2: 23 mmol/L (ref 20–29)
Calcium: 9.5 mg/dL (ref 8.6–10.2)
Chloride: 102 mmol/L (ref 96–106)
Creatinine, Ser: 1.29 mg/dL — ABNORMAL HIGH (ref 0.76–1.27)
GFR calc Af Amer: 61 mL/min/{1.73_m2} (ref >59)
GFR calc non Af Amer: 53 mL/min/{1.73_m2} — ABNORMAL LOW (ref >59)
Globulin, Total: 3.2 g/dL (ref 1.5–4.5)
Glucose: 100 mg/dL — ABNORMAL HIGH (ref 65–99)
Potassium: 5 mmol/L (ref 3.5–5.2)
Sodium: 139 mmol/L (ref 134–144)
Total Protein: 7.3 g/dL (ref 6.0–8.5)

## 2020-04-08 LAB — LIPID PANEL
Chol/HDL Ratio: 2.5 ratio (ref 0.0–5.0)
Cholesterol, Total: 152 mg/dL (ref 100–199)
HDL: 60 mg/dL (ref >39)
LDL Chol Calc (NIH): 76 mg/dL (ref 0–99)
Triglycerides: 88 mg/dL (ref 0–149)
VLDL Cholesterol Cal: 16 mg/dL (ref 5–40)

## 2020-04-08 LAB — HEMOGLOBIN A1C
Est. average glucose Bld gHb Est-mCnc: 126 mg/dL
Hgb A1c MFr Bld: 6 % — ABNORMAL HIGH (ref 4.8–5.6)

## 2020-04-08 LAB — VITAMIN D 25 HYDROXY (VIT D DEFICIENCY, FRACTURES): Vit D, 25-Hydroxy: 43.5 ng/mL (ref 30.0–100.0)

## 2020-04-08 LAB — PSA: Prostate Specific Ag, Serum: 1.6 ng/mL (ref 0.0–4.0)

## 2020-04-10 ENCOUNTER — Other Ambulatory Visit: Payer: Self-pay

## 2020-04-10 DIAGNOSIS — R002 Palpitations: Secondary | ICD-10-CM

## 2020-04-10 DIAGNOSIS — R072 Precordial pain: Secondary | ICD-10-CM

## 2020-04-11 ENCOUNTER — Other Ambulatory Visit: Payer: Self-pay

## 2020-04-11 ENCOUNTER — Other Ambulatory Visit: Payer: Medicare HMO

## 2020-04-11 DIAGNOSIS — R002 Palpitations: Secondary | ICD-10-CM

## 2020-04-11 DIAGNOSIS — R072 Precordial pain: Secondary | ICD-10-CM

## 2020-04-12 ENCOUNTER — Encounter (HOSPITAL_COMMUNITY): Payer: Self-pay

## 2020-04-12 ENCOUNTER — Telehealth (HOSPITAL_COMMUNITY): Payer: Self-pay | Admitting: Emergency Medicine

## 2020-04-12 LAB — BASIC METABOLIC PANEL
BUN/Creatinine Ratio: 15 (ref 10–24)
BUN: 17 mg/dL (ref 8–27)
CO2: 23 mmol/L (ref 20–29)
Calcium: 9.5 mg/dL (ref 8.6–10.2)
Chloride: 101 mmol/L (ref 96–106)
Creatinine, Ser: 1.14 mg/dL (ref 0.76–1.27)
GFR calc Af Amer: 71 mL/min/{1.73_m2} (ref >59)
GFR calc non Af Amer: 61 mL/min/{1.73_m2} (ref >59)
Glucose: 95 mg/dL (ref 65–99)
Potassium: 4.8 mmol/L (ref 3.5–5.2)
Sodium: 138 mmol/L (ref 134–144)

## 2020-04-12 NOTE — Telephone Encounter (Signed)
Reaching out to patient to offer assistance regarding upcoming cardiac imaging study; pt verbalizes understanding of appt date/time, parking situation and where to check in, pre-test NPO status and medications ordered, and verified current allergies; name and call back number provided for further questions should they arise   RN Navigator Cardiac Imaging [facility] [phone] office [phone] cell 

## 2020-04-13 ENCOUNTER — Ambulatory Visit (HOSPITAL_COMMUNITY)
Admission: RE | Admit: 2020-04-13 | Discharge: 2020-04-13 | Disposition: A | Discharge status: Home / Self Care | Payer: Medicare HMO | Source: Ambulatory Visit | Attending: Physician Assistant | Admitting: Physician Assistant

## 2020-04-13 ENCOUNTER — Other Ambulatory Visit: Payer: Self-pay

## 2020-04-13 DIAGNOSIS — I2510 Atherosclerotic heart disease of native coronary artery without angina pectoris: Secondary | ICD-10-CM | POA: Diagnosis not present

## 2020-04-13 DIAGNOSIS — R072 Precordial pain: Secondary | ICD-10-CM | POA: Diagnosis present

## 2020-04-13 MED ORDER — NITROGLYCERIN 0.4 MG SL SUBL
0.8000 mg | SUBLINGUAL_TABLET | Freq: Once | SUBLINGUAL | Status: AC
  Administered 2020-04-13: 09:00:00 0.8 mg via SUBLINGUAL

## 2020-04-13 MED ORDER — NITROGLYCERIN 0.4 MG SL SUBL
SUBLINGUAL_TABLET | SUBLINGUAL | Status: AC
  Filled 2020-04-13: qty 2

## 2020-04-13 MED ORDER — IOHEXOL 350 MG/ML SOLN
80.0000 mL | Freq: Once | INTRAVENOUS | Status: AC | PRN
  Administered 2020-04-13: 80 mL via INTRAVENOUS

## 2020-04-14 ENCOUNTER — Telehealth: Payer: Self-pay

## 2020-04-14 ENCOUNTER — Encounter: Payer: Self-pay | Admitting: Physician Assistant

## 2020-04-14 DIAGNOSIS — I2510 Atherosclerotic heart disease of native coronary artery without angina pectoris: Secondary | ICD-10-CM | POA: Insufficient documentation

## 2020-04-14 DIAGNOSIS — E7800 Pure hypercholesterolemia, unspecified: Secondary | ICD-10-CM

## 2020-04-14 MED ORDER — ATORVASTATIN CALCIUM 40 MG PO TABS: 40.0000 mg | ORAL_TABLET | Freq: Every day | ORAL | 1 refills | Status: DC

## 2020-04-14 NOTE — Telephone Encounter (Signed)
-----   Message from Liliane Shi, [place] sent at 04/14/2020 12:15 PM EDT ----- The coronary CTA demonstrates evidence of coronary artery disease or hardening of the arteries.  The calcium score is elevated.  However, there is no evidence of significant obstruction or a blockage that is dangerous.  This is good news.  He does need aggressive management of risk factors to lower the chance of these blockages becoming a problem down the road.  This includes blood pressure and cholesterol, diet, exercise, not smoking. PLAN:   -Increase Atorvastatin to 40 mg once daily   -Lipids and LFTs in 3 mos  -Send copy to PCP. Richardson Dopp, PA-C    04/14/2020 12:03 PM

## 2020-04-20 NOTE — Progress Notes (Signed)
Office Visit Note  Patient: Tony Mcmillan             Date of Birth: 1941-08-23           MRN: [id number]             PCP: Girtha Rm, NP-C Referring: Girtha Rm, NP-C Visit Date: 04/24/2020 Occupation: @GUAROCC @  Subjective:  Discuss restarting medications   History of Present Illness: Tony Mcmillan is a 79 y.o. male with history of seronegative rheumatoid arthritis and DDD.  He was previously taking Rinvoq 15 mg 1 tablet by mouth daily, methotrexate 0.3 mL subcutaneously once weekly and folic acid 1 mg by mouth daily.  He has been holding the Invokana methotrexate for about 2 months due to low white blood cell count.  He had updated lab work on 04/07/2020 and white blood cell count returned to within normal limits.  He states he does not notice much increased joint pain or inflammation since being off of both medications.  He is apprehensive to restart on methotrexate due to his abnormal kidney function.  He is interested in restarting on Rinvoq.  He continues to have pain and inflammation in both wrist joints.  He denies any other joint pain or joint swelling at this time. He has received both COVID-19 vaccinations.  He denies any recent infections.   Activities of Daily Living:  Patient reports joint stiffness all day  Patient Denies nocturnal pain.  Difficulty dressing/grooming: Denies Difficulty climbing stairs: Denies Difficulty getting out of chair: Reports Difficulty using hands for taps, buttons, cutlery, and/or writing: Reports  Review of Systems  Constitutional: Negative for fatigue and night sweats.  HENT: Negative for mouth sores, mouth dryness and nose dryness.   Eyes: Negative for redness and dryness.  Respiratory: Positive for shortness of breath. Negative for difficulty breathing.   Cardiovascular: Negative for chest pain, palpitations, hypertension, irregular heartbeat and swelling in legs/feet.  Gastrointestinal: Positive for constipation. Negative for  diarrhea.  Genitourinary: Negative for difficulty urinating and painful urination.  Musculoskeletal: Positive for arthralgias, gait problem, joint pain, joint swelling and morning stiffness. Negative for myalgias, muscle weakness, muscle tenderness and myalgias.  Skin: Negative for color change, rash, hair loss, nodules/bumps, skin tightness, ulcers and sensitivity to sunlight.  Allergic/Immunologic: Negative for susceptible to infections.  Neurological: Negative for dizziness, fainting, numbness, memory loss, night sweats and weakness.  Hematological: Negative for bruising/bleeding tendency and swollen glands.  Psychiatric/Behavioral: Negative for depressed mood and sleep disturbance. The patient is not nervous/anxious.     PMFS History:  Patient Active Problem List   Diagnosis Date Noted  . CAD (coronary artery disease)   . Enlarged prostate 03/10/2020  . Aortic valve calcification 03/10/2020  . 3-vessel coronary artery disease 03/10/2020  . Bladder wall thickening 03/10/2020  . History of 2019 novel coronavirus disease (COVID-19) 12/13/2019  . COVID-19 virus infection 11/15/2019  . Cough 11/15/2019  . Weight loss 11/15/2019  . Bilateral carotid bruits 02/09/2019  . Chest pain 02/08/2019  . Intermittent palpitations 01/29/2019  . Prediabetes 10/29/2018  . History of systemic steroid therapy 10/30/2017  . Feeling of chest tightness, unclear cause, ? due to RA 02/21/2017  . Pulmonary emphysema ([place]) 12/18/2016  . Dyspnea and respiratory abnormality 11/25/2016  . 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[place]) 11/04/2016  . High risk medication use 11/04/2016  . 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 Vitamin D deficiency 11/04/2016  . 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 History of anemia 07/02/2016  .  History of colonic polyps 07/02/2016  . History of gastric ulcer 07/02/2016  . Asymptomatic microscopic hematuria 04/03/2016  . Benign prostatic hyperplasia  04/03/2016  . Renal cyst, left 04/03/2016  . 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 DDD (degenerative disc disease), lumbar 04/03/2016  . Colonic polyp 02/23/2016  . Duodenal ulcer 02/23/2016    Past Medical History:  Diagnosis Date  . Anemia   . Arthritis   . Atypical chest pain    a. 09/2016 MV: EF 63%, normal perfusion.  Marland Kitchen CAD (coronary artery disease)    Coronary artery calcification on Chest CT in 09/2016 and 01/2019 // Myoview 10/17: normal perfusion // Myoview 01/2019: no ischemia or infarction, EF 58 // Coronary CTA 03/2020: Calcium Score 1067; non-obstructive CAD [oLM 1-24, LAD ost 25049, mid and dist 1-24; D1 1-24; LCx prox and mid 1-24, OM1 and OM2 25-49, RCA and PLB 1-24; PLA 1-24]   . Carotid bruit    Carotid Dopplers 01/2019: no ICA stenosis bilaterally   . COVID-19   . Diastolic dysfunction    a. 2015 Echo: EF >55%;  b. 09/2016 Echo: EF 55-60%, no rwma, Gr1 DD, triv AI/MR, mild TR, PASP 4mmHg.  . Emphysema of lung ([place]) 02/08/2016  . GERD (gastroesophageal reflux disease)   . History of systemic steroid therapy 10/30/2017  . Hx of Blood Transfusion   . Palpitations    Echo 10/17: Mild focal basal septal hypertrophy, EF 55-60, no RWMA, Gr 1 DD, trivial MR, normal RVSF, mild TR, PASP 28 // Event monitor 01/2019: NSR, PACs  . Rheumatoid arthritis ([place])   . Tubular adenoma of colon 01/2016    Family History  Problem Relation Age of Onset  . Hypertension Mother   . Hypertension Father   . Diabetes Father   . Heart disease Father        stents  . Hyperlipidemia Sister   . Diabetes Sister   . Stroke Brother   . Diabetes Brother   . Heart disease Brother        stents  . Colon polyps Neg Hx   . Esophageal cancer Neg Hx   . Stomach cancer Neg Hx   . Rectal cancer Neg Hx   . Colon cancer Neg Hx    Past Surgical History:  Procedure Laterality Date  . ANTERIOR CERVICAL CORPECTOMY    . ANTERIOR CERVICAL CORPECTOMY  12/2014   for infection. this was in [place]  .  CATARACT EXTRACTION     both eyes  . COLOSTOMY REVERSAL     had infection on buttocks and had to have a skin graft- had colostomy to help area stay clean and heal  . HERNIA REPAIR    . SPINE SURGERY     Social History   Social History Narrative   Retired. Lives alone.    Immunization History  Administered Date(s) Administered  . Influenza, High Dose Seasonal PF 07/23/2017, 08/05/2019  . Influenza,inj,Quad PF,6+ Mos 08/01/2017  . Influenza-Unspecified 10/30/2015, 09/13/2016, 07/30/2018, 08/05/2019  . PFIZER SARS-COV-2 Vaccination 01/28/2020, 02/18/2020  . Pneumococcal Conjugate-13 12/05/2015  . Pneumococcal Polysaccharide-23 04/23/2017  . Tdap 12/09/2015  . Zoster 12/09/2015     Objective: Vital Signs: BP 112/62 (BP Location: Left Arm, Patient Position: Sitting, Cuff Size: Normal)   Pulse 76   Resp 16   Ht 5\' 3"  (1.6 m)   Wt 147 lb 6.4 oz (66.9 kg)   BMI 26.11 kg/m    Physical Exam Vitals and nursing note reviewed.  Constitutional:  Appearance: He is well-developed.  HENT:     Head: Normocephalic and atraumatic.  Eyes:     Conjunctiva/sclera: Conjunctivae normal.     Pupils: Pupils are equal, round, and reactive to light.  Pulmonary:     Effort: Pulmonary effort is normal.  Abdominal:     General: Bowel sounds are normal.     Palpations: Abdomen is soft.  Musculoskeletal:     Cervical back: Normal range of motion and neck supple.  Skin:    General: Skin is warm and dry.     Capillary Refill: Capillary refill takes less than 2 seconds.  Neurological:     Mental Status: He is alert and oriented to person, place, and time.  Psychiatric:        Behavior: Behavior normal.      Musculoskeletal Exam: C-spine limited ROM.  Thoracic kyphosis noted.  No midline spinal tenderness.  No SI joint tenderness.  Shoulder joint abduction to about 120 degrees bilaterally.  Right elbow joint contracture.  No tenderness or synovitis of elbow joints noted.  Very slight limited  range of motion of both wrist joints.  He has tenderness and mild extensor tenosynovitis of the right wrist.  He has extensive extensor tenosynovitis of the left wrist joint.  MCPs, PIPs, DIPs have good range of motion with no synovitis.  He has complete fist formation bilaterally.  Knee joints have good range of motion with no warmth or effusion.  Ankle joints have good range of motion with no tenderness or inflammation.  CDAI Exam: CDAI Score: 5.4  Patient Global: 8 mm; Provider Global: 6 mm Swollen: 2 ; Tender: 2  Joint Exam 04/24/2020      Right  Left  Wrist  Swollen Tender  Swollen Tender     Investigation: No additional findings.  Imaging: CT CORONARY MORPH W/CTA COR W/SCORE W/CA W/CM &/OR WO/CM  Addendum Date: 04/13/2020   ADDENDUM REPORT: 04/13/2020 12:07 CLINICAL DATA:  Chest pain EXAM: Cardiac CTA MEDICATIONS: Sub lingual nitro. 4 mg TECHNIQUE: The patient was scanned on a [employer] 192 scanner. Gantry rotation speed was 250 msecs. Collimation was. 6 mm . A 120 kV prospective scan was triggered in the ascending thoracic aorta at 140 HU's with full mA between 30-70% of the R-R interval . Average HR during the scan was 71 bpm. The 3D data set was interpreted on a dedicated work station using MPR, MIP and VRT modes. A total of 80 cc of contrast was used. FINDINGS: Non-cardiac: See separate report from [facility]. No significant findings on limited lung and soft tissue windows. Calcium score: 3 vessel coronary calcium noted Coronary Arteries: Right dominant with no anomalies LM: 1-24% ostial calcific plaque LAD: 25-49% calcific plaque proximally 1-24% calcific plaque in mid and distal LAD D1: 1-24% calcific plaque D2: Normal D3: Normal Circumflex: 1-24% calcific plaque in proximal and mid vessel OM1: 25-49% ostial calcific plaque OM2: 25-49% ostial calcific plaque RCA: 1-24% calcific plaque through out the RCA and posterior lateral branch PDA: Normal PLA: 1-24% calcific plaque  IMPRESSION: 1.  Calcium score 1067 which is 93 rd percentile for age and sex 2.  Normal aortic root diameter with atherosclerosis 3.3 cm 3.  CAD: CAD RADS 2 non obstructive see description above [place] Electronically Signed   By: Jenkins Rouge M.D.   On: 04/13/2020 12:07   Result Date: 04/13/2020 EXAM: OVER-READ INTERPRETATION  CT CHEST The following report is an over-read performed by radiologist Dr. Vinnie Langton of [facility] on 04/13/2020. This over-read does not include interpretation of cardiac or coronary anatomy or pathology. The coronary calcium score/coronary CTA interpretation by the cardiologist is attached. COMPARISON:  Chest CT 12/03/2016. FINDINGS: Aortic atherosclerosis. Within the visualized portions of the thorax there are no suspicious appearing pulmonary nodules or masses, there is no acute consolidative airspace disease, no pleural effusions, no pneumothorax and no lymphadenopathy. Visualized portions of the upper abdomen are unremarkable. There are no aggressive appearing lytic or blastic lesions noted in the visualized portions of the skeleton. IMPRESSION: 1.  Aortic Atherosclerosis (ICD10-I70.0). Electronically Signed: By: Vinnie Langton M.D. On: 04/13/2020 10:30    Recent Labs: Lab Results  Component Value Date   WBC 4.4 04/07/2020   HGB 13.8 04/07/2020   PLT 245 04/07/2020   NA 138 04/11/2020   K 4.8 04/11/2020   CL 101 04/11/2020   CO2 23 04/11/2020   GLUCOSE 95 04/11/2020   BUN 17 04/11/2020   CREATININE 1.14 04/11/2020   BILITOT 0.6 04/07/2020   ALKPHOS 66 04/07/2020   AST 24 04/07/2020   ALT 17 04/07/2020   PROT 7.3 04/07/2020   ALBUMIN 4.1 04/07/2020   CALCIUM 9.5 04/11/2020   GFRAA 71 04/11/2020   QFTBGOLDPLUS NEGATIVE 09/24/2019    Speciality Comments: No specialty comments available.  Procedures:  No procedures performed Allergies: Patient has no known allergies.   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  He experiences intermittent pain and inflammation in both wrists, left greater than right.  He has very limited range of motion of both wrist joints on exam.  He is not experiencing any other joint pain or inflammation at this time.  He has been holding Rinvoq and methotrexate for the past 2 months.  On 02/21/2020 his white blood cell count was 2.8 and absolute lymphocytes were 521.  He has not noticed any increased joint pain or inflammation since being off Rinvoq or methotrexate.   His GFR and WBC count returned to WNL while off of MTX and Rinvoq. We discussed permanently discontinuing methotrexate due to history of abnormal kidney function.  He was advised to restart taking Rinvoq 15 mg 1 tablet by mouth daily and return in 1 month for repeat lab work.  He is in agreement.  He was advised to notify us if he develops increased joint pain or joint swelling while off of methotrexate.  He will follow-up in the office in 5 months.  High risk medication use - Rinvoq 15 mg 1 tablet by mouth daily.  Discontinue methotrexate 0.3 mL subcutaneous injections once weekly due to abnormal kidney function and low white blood cell count.  CBC and CMP were updated on 04/07/2020.  He was advised to return for lab work in 1 month.  Standing orders for CBC and CMP are in place.  TB gold was negative on 10-20.  Lymphopenia: White blood cell count was 2.8 on 02/21/2020.  His white blood cell count has returned within normal limits-4.4 on 04/07/2020 since holding Rinvoq and methotrexate.  He will not restart on methotrexate at this time.  He was advised to restart taking Rinvoq 15 mg 1 tablet by mouth daily.  He will return for lab work in 38 month.  Standing orders are in place.  DDD (degenerative disc disease), cervical: He has limited  range of motion  with lateral rotation and extension of the C-spine.  He has no symptoms of radiculopathy currently.  DDD (degenerative disc disease), lumbar: He has painful limited range of motion of lumbar spine.  He has no symptoms of radiculopathy at this time.  He uses a cane to assist with ambulation.   Osteopenia of multiple sites: He is taking a calcium and vitamin D supplement daily.  Elevated CK: CK was 237 on 04/21/2019.  He is not experiencing any muscle aches or muscle tenderness at this time.  He has not developed any muscle weakness.  History of vitamin D deficiency: He is taking calcium and vitamin D supplement daily.  Other medical conditions are listed as follows:  History of colonic polyps  History of COPD  Asymptomatic microscopic hematuria  History of gastric ulcer  Dyslipidemia  History of BPH  Orders: No orders of the defined types were placed in this encounter.  No orders of the defined types were placed in this encounter.    Follow-Up Instructions: Return in about 5 months (around 09/24/2020) for Rheumatoid arthritis, DDD.   Ofilia Neas, PA-C  Note - This record has been created using Dragon software.  Chart creation errors have been sought, but may not always  have been located. Such creation errors do not reflect on  the standard of medical care.

## 2020-04-24 ENCOUNTER — Other Ambulatory Visit: Payer: Self-pay

## 2020-04-24 ENCOUNTER — Ambulatory Visit (INDEPENDENT_AMBULATORY_CARE_PROVIDER_SITE_OTHER): Payer: Medicare HMO | Admitting: Physician Assistant

## 2020-04-24 ENCOUNTER — Encounter: Payer: Self-pay | Admitting: Physician Assistant

## 2020-04-24 VITALS — BP 112/62 | HR 76 | Resp 16 | Ht 63.0 in | Wt 147.4 lb

## 2020-04-24 DIAGNOSIS — R3121 Asymptomatic microscopic hematuria: Secondary | ICD-10-CM

## 2020-04-24 DIAGNOSIS — Z8639 Personal history of other endocrine, nutritional and metabolic disease: Secondary | ICD-10-CM

## 2020-04-24 DIAGNOSIS — M5030 Other cervical disc degeneration, unspecified cervical region: Secondary | ICD-10-CM | POA: Diagnosis not present

## 2020-04-24 DIAGNOSIS — M8589 Other specified disorders of bone density and structure, multiple sites: Secondary | ICD-10-CM

## 2020-04-24 DIAGNOSIS — E785 Hyperlipidemia, unspecified: Secondary | ICD-10-CM

## 2020-04-24 DIAGNOSIS — Z79899 Other long term (current) drug therapy: Secondary | ICD-10-CM | POA: Diagnosis not present

## 2020-04-24 DIAGNOSIS — D72810 Lymphocytopenia: Secondary | ICD-10-CM

## 2020-04-24 DIAGNOSIS — Z86010 Personal history of colonic polyps: Secondary | ICD-10-CM

## 2020-04-24 DIAGNOSIS — Z8711 Personal history of peptic ulcer disease: Secondary | ICD-10-CM

## 2020-04-24 DIAGNOSIS — Z8709 Personal history of other diseases of the respiratory system: Secondary | ICD-10-CM

## 2020-04-24 DIAGNOSIS — Z8719 Personal history of other diseases of the digestive system: Secondary | ICD-10-CM

## 2020-04-24 DIAGNOSIS — M0609 Rheumatoid arthritis without rheumatoid factor, multiple sites: Secondary | ICD-10-CM | POA: Diagnosis not present

## 2020-04-24 DIAGNOSIS — M5136 Other intervertebral disc degeneration, lumbar region: Secondary | ICD-10-CM

## 2020-04-24 DIAGNOSIS — Z87438 Personal history of other diseases of male genital organs: Secondary | ICD-10-CM

## 2020-04-24 DIAGNOSIS — R748 Abnormal levels of other serum enzymes: Secondary | ICD-10-CM

## 2020-04-24 NOTE — Patient Instructions (Addendum)
Standing Labs We placed an order today for your standing lab work.    Please come back and get your standing labs in June and every 3 months   We have open lab daily Monday through Thursday from 8:30-12:30 PM and 1:30-4:30 PM and Friday from 8:30-12:30 PM and 1:30-4:00 PM at the office of Dr. Shaili Deveshwar.   You may experience shorter wait times on Monday and Friday afternoons. The office is located at [address], [address], [place],  [address] No appointment is necessary.   Labs are drawn by [facility].  You may receive a bill from [facility] for your lab work.  If you wish to have your labs drawn at another location, please call the office 24 hours in advance to send orders.  If you have any questions regarding directions or hours of operation,  please call [phone].   Just as a reminder please drink plenty of water prior to coming for your lab work. Thanks!   

## 2020-04-25 MED ORDER — RINVOQ 15 MG PO TB24: 15.0000 mg | ORAL_TABLET | Freq: Every day | ORAL | 0 refills | Status: DC

## 2020-04-25 NOTE — Telephone Encounter (Signed)
Last Visit: 04/24/2020 Next Visit: 09/26/2020 Labs: 04/07/2020 TB Gold: 09/24/2019 negative   Per office note on 04/24/2020, He was advised to restart taking Rinvoq 15 mg 1 tablet by mouth daily and return in 1 month for repeat lab work.  Okay to refill per Dr. Estanislado Pandy.

## 2020-05-03 ENCOUNTER — Other Ambulatory Visit (INDEPENDENT_AMBULATORY_CARE_PROVIDER_SITE_OTHER): Payer: Medicare HMO

## 2020-05-03 DIAGNOSIS — R7989 Other specified abnormal findings of blood chemistry: Secondary | ICD-10-CM

## 2020-05-03 LAB — HEPATIC FUNCTION PANEL
ALT: 18 U/L (ref 0–53)
AST: 22 U/L (ref 0–37)
Albumin: 4.3 g/dL (ref 3.5–5.2)
Alkaline Phosphatase: 52 U/L (ref 39–117)
Bilirubin, Direct: 0.2 mg/dL (ref 0.0–0.3)
Total Bilirubin: 0.8 mg/dL (ref 0.2–1.2)
Total Protein: 7.4 g/dL (ref 6.0–8.3)

## 2020-05-24 ENCOUNTER — Other Ambulatory Visit: Payer: Self-pay

## 2020-05-24 DIAGNOSIS — Z79899 Other long term (current) drug therapy: Secondary | ICD-10-CM

## 2020-05-24 LAB — COMPLETE METABOLIC PANEL WITH GFR
AG Ratio: 1.5 (calc) (ref 1.0–2.5)
ALT: 22 U/L (ref 9–46)
AST: 26 U/L (ref 10–35)
Albumin: 4.1 g/dL (ref 3.6–5.1)
Alkaline phosphatase (APISO): 47 U/L (ref 35–144)
BUN: 16 mg/dL (ref 7–25)
CO2: 26 mmol/L (ref 20–32)
Calcium: 9.5 mg/dL (ref 8.6–10.3)
Chloride: 105 mmol/L (ref 98–110)
Creat: 1.18 mg/dL (ref 0.70–1.18)
GFR, Est African American: 68 mL/min/{1.73_m2} (ref >=60)
GFR, Est Non African American: 59 mL/min/{1.73_m2} — ABNORMAL LOW (ref >=60)
Globulin: 2.7 g/dL (calc) (ref 1.9–3.7)
Glucose, Bld: 96 mg/dL (ref 65–99)
Potassium: 4.8 mmol/L (ref 3.5–5.3)
Sodium: 139 mmol/L (ref 135–146)
Total Bilirubin: 1 mg/dL (ref 0.2–1.2)
Total Protein: 6.8 g/dL (ref 6.1–8.1)

## 2020-05-24 LAB — CBC WITH DIFFERENTIAL/PLATELET
Absolute Monocytes: 479 cells/uL (ref 200–950)
Basophils Absolute: 21 cells/uL (ref 0–200)
Basophils Relative: 0.5 %
Eosinophils Absolute: 59 cells/uL (ref 15–500)
Eosinophils Relative: 1.4 %
HCT: 38.9 % (ref 38.5–50.0)
Hemoglobin: 12.9 g/dL — ABNORMAL LOW (ref 13.2–17.1)
Lymphs Abs: 886 cells/uL (ref 850–3900)
MCH: 28.4 pg (ref 27.0–33.0)
MCHC: 33.2 g/dL (ref 32.0–36.0)
MCV: 85.7 fL (ref 80.0–100.0)
MPV: 9.5 fL (ref 7.5–12.5)
Monocytes Relative: 11.4 %
Neutro Abs: 2755 cells/uL (ref 1500–7800)
Neutrophils Relative %: 65.6 %
Platelets: 249 10*3/uL (ref 140–400)
RBC: 4.54 10*6/uL (ref 4.20–5.80)
RDW: 16.3 % — ABNORMAL HIGH (ref 11.0–15.0)
Total Lymphocyte: 21.1 %
WBC: 4.2 10*3/uL (ref 3.8–10.8)

## 2020-05-25 NOTE — Progress Notes (Signed)
Mild anemia noted.  GFR is mildly decreased.  We will continue to monitor.  Please forward labs to his PCP.

## 2020-05-25 NOTE — Telephone Encounter (Signed)
Contacted patient and reviewed lab results with patient.  

## 2020-06-11 ENCOUNTER — Encounter: Payer: Self-pay | Admitting: Physician Assistant

## 2020-06-11 NOTE — Progress Notes (Signed)
Cardiology Office Note:    Date:  06/12/2020   ID:  Tony Mcmillan, DOB 1941/08/14, MRN [id number]  PCP:  Tony Rm, NP-C  Cardiologist:  Tony Rouge, MD   Electrophysiologist:  None   Referring MD: Tony Rm, NP-C   Chief Complaint:  Follow-up (CAD)    Patient Profile:    Tony Mcmillan is a 79 y.o. male with:   Coronary artery disease, non-obstructive  ? Coronary artery calcification, aortic atherosclerosis on Chest CT 11/2016, 02/2020 ? Cor CTA 03/2020: Ca score 1067; LM 1-24, oLAD 25-49, m+dLAD 1-24, D1 1-24; p+mLCx 1-24; OM1 and OM2 24-59; RCA and PLB 1-24; PLA 1-24  Emphysema  Chest pain  ? Myoview 09/2016: normal perfusion ? Myoview 01/2019: no ischemia or scar, EF 58  GERD  Rheumatoid Arthritis   Prior CV studies: Coronary CTA 03/2020:  Calcium Score 1067; non-obstructive CAD [oLM 1-24, LAD ost 25-49, mid and dist 1-24; D1 1-24; LCx prox and mid 1-24, OM1 and OM2 25-49, RCA and PLB 1-24; PLA 1-24]   Event Monitor 02/16/2019 NSR, PACs  Myoview 02/16/2019 EF 58, no ischemia or infarction  Carotid US 02/15/2019 No ICA stenosis   Myoview 10/14/16 EF 63, normal perfusion; Low Risk  Echocardiogram 10/14/16 Mild focal basal septal hypertrophy, EF 55-60, no RWMA, Gr 1 DD, trivial MR, normal RVSF, mild TR, PASP 28  History of Present Illness:    Tony Mcmillan was last seen in clinic in 02/2020.  He had been referred by GI due to evidence of coronary artery calcification done for abdominal pain.  The patient had symptoms of occasional chest pain.  I recommended proceeding with a coronary CTA for further evaluation.  This demonstrated a calcium score of 1067 and mild nonobstructive disease.  I adjusted his atorvastatin.  He returns for follow-up.  He is here alone.   He has occasional chest pain that is very brief and not related to exertion.  This is unchanged.  He has chronic shortness of breath that is also unchanged.  He has not had syncope, orthopnea,  significant leg swelling.    Past Medical History:  Diagnosis Date   3-vessel coronary artery disease 03/10/2020   Anemia    Aortic valve calcification 03/10/2020   Arthritis    Asymptomatic microscopic hematuria 04/03/2016   Cystoscopy 02/207 Dr. Pilar Jarvis [facility].     Atypical chest pain    a. 09/2016 MV: EF 63%, normal perfusion.   Benign prostatic hyperplasia 04/03/2016   Asymptomatic. Normal PSA and exam by Dr. Pilar Jarvis [facility]an 01/2016.     Bilateral carotid bruits 02/09/2019   Bladder wall thickening 03/10/2020   CAD (coronary artery disease)    Coronary artery calcification on Chest CT in 09/2016 and 01/2019 // Myoview 10/17: normal perfusion // Myoview 01/2019: no ischemia or infarction, EF 58 // Coronary CTA 03/2020: Calcium Score 1067; non-obstructive CAD [oLM 1-24, LAD ost 25049, mid and dist 1-24; D1 1-24; LCx prox and mid 1-24, OM1 and OM2 25-49, RCA and PLB 1-24; PLA 1-24]    Carotid bruit    Carotid Dopplers 01/2019: no ICA stenosis bilaterally    Colonic polyp 02/23/2016   COVID-19    DDD (degenerative disc disease), 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 L4-5 and I7-P8    Diastolic dysfunction    a. 2015 Echo: EF >55%;  b. 09/2016 Echo: EF 55-60%, no rwma, Gr1 DD, triv AI/MR, mild TR, PASP 25mmHg.   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   On CT 01/2016    Palpitations    Echo 10/17: Mild focal basal septal hypertrophy, EF 55-60, no RWMA, Gr 1 DD, trivial MR, normal RVSF, mild TR, PASP 28 // Event monitor 01/2019: NSR, PACs   Prediabetes 10/29/2018   Pulmonary emphysema ([place]) 12/18/2016   Renal cyst, left 04/03/2016   On CT, benign     Rheumatoid arthritis ([place])    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   atorvastatin (LIPITOR) 40 MG tablet Take 1 tablet (40 mg total) by mouth daily.   Calcium Carb-Cholecalciferol (CALCIUM-VITAMIN D3) 600-400 MG-UNIT TABS Take by mouth.   Glycopyrrolate-Formoterol (BEVESPI AEROSPHERE) 9-4.8 MCG/ACT AERO Inhale 2 puffs into the lungs 2 (two) times daily.   metoprolol tartrate (LOPRESSOR) 50 MG tablet Take 50 mg by mouth daily.   MYRBETRIQ 50 MG TB24 tablet Take 50 mg by mouth daily.    pantoprazole (PROTONIX) 40 MG tablet Take 1 tablet (40 mg total) by mouth daily.   Upadacitinib ER (RINVOQ) 15 MG TB24 Take 15 mg by mouth daily.     Allergies:   Patient has no known allergies.   Social History   Tobacco Use   Smoking status: Former Smoker    Packs/day: 1.00    Years: 17.00    Pack years: 17.00    Types: Cigarettes    Quit date: 12/23/1993    Years since quitting: 26.4   Smokeless tobacco: Never Used  Vaping Use   Vaping Use: Never used  Substance Use Topics   Alcohol use: No    Alcohol/week: 0.0 standard drinks   Drug use: No     Family Hx: The patient's family history includes Diabetes in his brother, father, and sister; Heart disease in his brother and father; Hyperlipidemia in his sister; Hypertension in his father and mother; Stroke in his brother. There is no history of Colon polyps, Esophageal cancer, Stomach cancer, Rectal cancer, or Colon cancer.  ROS   EKGs/Labs/Other Test Reviewed:    EKG:  EKG is not ordered today.  The ekg ordered today demonstrates n/a  Recent Labs: 05/24/2020: ALT 22; BUN 16; Creat 1.18; Hemoglobin 12.9; Platelets 249; Potassium 4.8; Sodium 139   Recent  Lipid Panel Lab Results  Component Value Date/Time   CHOL 152 04/07/2020 11:49 AM   TRIG 88 04/07/2020 11:49 AM   HDL 60 04/07/2020 11:49 AM   CHOLHDL 2.5 04/07/2020 11:49 AM   CHOLHDL 3.2 09/23/2019 01:30 PM   LDLCALC 76 04/07/2020 11:49 AM   LDLCALC 98 09/23/2019 01:30 PM    Physical Exam:    VS:  BP (!) 100/58    Pulse 78    Ht 5\' 3"  (1.6 m)    Wt 149 lb (67.6 kg)    SpO2 98%    BMI 26.39 kg/m     Wt Readings from Last 3 Encounters:  06/12/20 149 lb (67.6 kg)  04/24/20 147 lb 6.4 oz (66.9 kg)  04/07/20 149 lb (67.6 kg)     Constitutional:      Appearance: Healthy appearance. Not in distress.  Pulmonary:     Effort: Pulmonary effort is normal.     Breath sounds: No wheezing. No rales.  Cardiovascular:     Normal rate. Regular rhythm. Normal S1. Normal S2.     Murmurs: There is no murmur.  Edema:    Peripheral edema absent.  Abdominal:     Palpations: Abdomen is soft.  Musculoskeletal:     Cervical back: Neck supple. Skin:    General: Skin is warm and dry.  Neurological:     General: No focal deficit present.     Mental Status: Alert and oriented to person, place and time.     Cranial Nerves: Cranial nerves are intact.       ASSESSMENT & PLAN:    1. Coronary artery disease involving native coronary artery of native heart without angina pectoris Mild to mod non-obstructive coronary artery disease by coronary CTA in 03/2020.  He has some occasional chest pain but no symptoms suggestive of progressive angina.  Continue ASA, high intensity statin.  I will give him a Rx for NTG to use prn.  I have asked him to contact us if he has symptoms that require NTG for relief.  FU in 1 year or sooner if needed.   2. Mixed hyperlipidemia He remains on high intensity statin.  Fasting lipids are scheduled to be rechecked today.  Goal LDL < 70.       Dispo:  Return in about 1 year (around 06/12/2021) for Routine Follow Up w/ Dr. Johnsie Cancel, or Richardson Dopp, PA-C, in person.    Medication Adjustments/Labs and Tests Ordered: Current medicines are reviewed at length with the patient today.  Concerns regarding medicines are outlined above.  Tests Ordered: Orders Placed This Encounter  Procedures   Lipid panel   Medication Changes: Meds ordered this encounter  Medications   nitroGLYCERIN (NITROSTAT) 0.4 MG SL tablet    Sig: Place 1 tablet (0.4 mg total) under the tongue every 5 (five) minutes as needed for chest pain.    Dispense:  25 tablet    Refill:  6    Signed, Richardson Dopp, PA-C  06/12/2020 12:25 PM    [facility] [address], [place], [place]  [address] Phone: [phone]; Fax: [phone]

## 2020-06-12 ENCOUNTER — Encounter: Payer: Self-pay | Admitting: Physician Assistant

## 2020-06-12 ENCOUNTER — Ambulatory Visit (INDEPENDENT_AMBULATORY_CARE_PROVIDER_SITE_OTHER): Payer: Medicare HMO | Admitting: Physician Assistant

## 2020-06-12 ENCOUNTER — Other Ambulatory Visit: Payer: Self-pay

## 2020-06-12 VITALS — BP 100/58 | HR 78 | Ht 63.0 in | Wt 149.0 lb

## 2020-06-12 DIAGNOSIS — E782 Mixed hyperlipidemia: Secondary | ICD-10-CM

## 2020-06-12 DIAGNOSIS — I2510 Atherosclerotic heart disease of native coronary artery without angina pectoris: Secondary | ICD-10-CM

## 2020-06-12 LAB — LIPID PANEL
Chol/HDL Ratio: 2.1 ratio (ref 0.0–5.0)
Cholesterol, Total: 154 mg/dL (ref 100–199)
HDL: 74 mg/dL (ref >39)
LDL Chol Calc (NIH): 65 mg/dL (ref 0–99)
Triglycerides: 79 mg/dL (ref 0–149)
VLDL Cholesterol Cal: 15 mg/dL (ref 5–40)

## 2020-06-12 MED ORDER — NITROGLYCERIN 0.4 MG SL SUBL: 0.4000 mg | SUBLINGUAL_TABLET | SUBLINGUAL | 6 refills | Status: DC | PRN

## 2020-06-12 NOTE — Patient Instructions (Signed)
Medication Instructions:   Your physician has recommended you make the following change in your medication:   1) Start Nitroglycerin 0.4 mg sublingual as needed for chest pain  *If you need a refill on your cardiac medications before your next appointment, please call your pharmacy*  Lab Work:  You will have labs drawn today: Lipid  If you have labs (blood work) drawn today and your tests are completely normal, you will receive your results only by: Marland Kitchen MyChart Message (if you have MyChart) OR . A paper copy in the mail If you have any lab test that is abnormal or we need to change your treatment, we will call you to review the results.  Testing/Procedures:  None ordered today  Follow-Up: At [facility], you and your health needs are our priority.  As part of our continuing mission to provide you with exceptional heart care, we have created designated Provider Care Teams.  These Care Teams include your primary Cardiologist (physician) and Advanced Practice Providers (APPs -  Physician Assistants and Nurse Practitioners) who all work together to provide you with the care you need, when you need it.  We recommend signing up for the patient portal called "MyChart".  Sign up information is provided on this After Visit Summary.  MyChart is used to connect with patients for Virtual Visits (Telemedicine).  Patients are able to view lab/test results, encounter notes, upcoming appointments, etc.  Non-urgent messages can be sent to your provider as well.   To learn more about what you can do with MyChart, go to [email or web address].    Your next appointment:   12 month(s)  The format for your next appointment:   In Person  Provider:   You may see Jenkins Rouge, MD or Richardson Dopp, PA-C

## 2020-07-06 ENCOUNTER — Other Ambulatory Visit: Payer: Self-pay | Admitting: Rheumatology

## 2020-07-06 NOTE — Telephone Encounter (Signed)
Last Visit: 04/24/2020 Next visit: 09/26/2020 Labs: 05/24/2020 Mild anemia noted. GFR is mildly decreased  Current Dose per office note on 04/24/2020: Rinvoq 15 mg 1 tablet by mouth daily  Okay to refill Rinvoq?

## 2020-07-14 ENCOUNTER — Other Ambulatory Visit: Payer: Medicare HMO

## 2020-07-26 ENCOUNTER — Telehealth: Payer: Self-pay | Admitting: Rheumatology

## 2020-07-26 NOTE — Telephone Encounter (Signed)
A PA is needed for [place]. Case # [id number]

## 2020-07-27 NOTE — Telephone Encounter (Signed)
Submitted a Prior Authorization request to [employer] for [facility] via Cover My Meds. Will update once we receive a response.   (Key: [id number])

## 2020-07-27 NOTE — Telephone Encounter (Signed)
Called and submitted PA to [employer].  Received notification from [employer] regarding a prior authorization for [facility]. Authorization has been APPROVED from 06/27/20 to 07/27/21.   Authorization # [id number] Phone # [phone]  Called [facility] and advised.

## 2020-09-01 ENCOUNTER — Other Ambulatory Visit: Payer: Self-pay | Admitting: *Deleted

## 2020-09-01 DIAGNOSIS — Z9225 Personal history of immunosupression therapy: Secondary | ICD-10-CM

## 2020-09-01 DIAGNOSIS — Z79899 Other long term (current) drug therapy: Secondary | ICD-10-CM

## 2020-09-01 DIAGNOSIS — Z111 Encounter for screening for respiratory tuberculosis: Secondary | ICD-10-CM

## 2020-09-03 LAB — COMPLETE METABOLIC PANEL WITH GFR
AG Ratio: 1.5 (calc) (ref 1.0–2.5)
ALT: 25 U/L (ref 9–46)
AST: 26 U/L (ref 10–35)
Albumin: 4.2 g/dL (ref 3.6–5.1)
Alkaline phosphatase (APISO): 55 U/L (ref 35–144)
BUN/Creatinine Ratio: 10 (calc) (ref 6–22)
BUN: 16 mg/dL (ref 7–25)
CO2: 26 mmol/L (ref 20–32)
Calcium: 9.2 mg/dL (ref 8.6–10.3)
Chloride: 101 mmol/L (ref 98–110)
Creat: 1.63 mg/dL — ABNORMAL HIGH (ref 0.70–1.18)
GFR, Est African American: 46 mL/min/{1.73_m2} — ABNORMAL LOW (ref >=60)
GFR, Est Non African American: 40 mL/min/{1.73_m2} — ABNORMAL LOW (ref >=60)
Globulin: 2.8 g/dL (calc) (ref 1.9–3.7)
Glucose, Bld: 105 mg/dL — ABNORMAL HIGH (ref 65–99)
Potassium: 4.6 mmol/L (ref 3.5–5.3)
Sodium: 137 mmol/L (ref 135–146)
Total Bilirubin: 0.9 mg/dL (ref 0.2–1.2)
Total Protein: 7 g/dL (ref 6.1–8.1)

## 2020-09-03 LAB — CBC WITH DIFFERENTIAL/PLATELET
Absolute Monocytes: 625 cells/uL (ref 200–950)
Basophils Absolute: 18 cells/uL (ref 0–200)
Basophils Relative: 0.3 %
Eosinophils Absolute: 53 cells/uL (ref 15–500)
Eosinophils Relative: 0.9 %
HCT: 38.9 % (ref 38.5–50.0)
Hemoglobin: 12.9 g/dL — ABNORMAL LOW (ref 13.2–17.1)
Lymphs Abs: 643 cells/uL — ABNORMAL LOW (ref 850–3900)
MCH: 29.2 pg (ref 27.0–33.0)
MCHC: 33.2 g/dL (ref 32.0–36.0)
MCV: 88 fL (ref 80.0–100.0)
MPV: 9.5 fL (ref 7.5–12.5)
Monocytes Relative: 10.6 %
Neutro Abs: 4561 cells/uL (ref 1500–7800)
Neutrophils Relative %: 77.3 %
Platelets: 310 10*3/uL (ref 140–400)
RBC: 4.42 10*6/uL (ref 4.20–5.80)
RDW: 14.7 % (ref 11.0–15.0)
Total Lymphocyte: 10.9 %
WBC: 5.9 10*3/uL (ref 3.8–10.8)

## 2020-09-03 LAB — QUANTIFERON-TB GOLD PLUS
Mitogen-NIL: 3.97 IU/mL
NIL: 0.01 IU/mL
QuantiFERON-TB Gold Plus: NEGATIVE
TB1-NIL: 0 IU/mL
TB2-NIL: 0.01 IU/mL

## 2020-09-03 NOTE — Progress Notes (Signed)
Elevated creatinine noted.  Rinvoq should not affect his creatinine level.  Please evaluate for other causes of elevated creatinine.

## 2020-09-04 NOTE — Telephone Encounter (Signed)
Spoke with patient and reviewed lab results. 

## 2020-09-04 NOTE — Progress Notes (Signed)
His kidney function was elevated at his visit with his rheumatologist. Let's ask him to hydrate for a few days and come in for an office visit to discuss and recheck this.

## 2020-09-06 ENCOUNTER — Ambulatory Visit (INDEPENDENT_AMBULATORY_CARE_PROVIDER_SITE_OTHER): Payer: Medicare HMO | Admitting: Family Medicine

## 2020-09-06 ENCOUNTER — Encounter: Payer: Self-pay | Admitting: Family Medicine

## 2020-09-06 ENCOUNTER — Other Ambulatory Visit: Payer: Self-pay

## 2020-09-06 VITALS — BP 120/70 | HR 74 | Wt 150.0 lb

## 2020-09-06 DIAGNOSIS — R7989 Other specified abnormal findings of blood chemistry: Secondary | ICD-10-CM

## 2020-09-06 DIAGNOSIS — M069 Rheumatoid arthritis, unspecified: Secondary | ICD-10-CM

## 2020-09-06 LAB — POCT URINALYSIS DIP (PROADVANTAGE DEVICE)
Bilirubin, UA: NEGATIVE
Blood, UA: NEGATIVE
Glucose, UA: NEGATIVE mg/dL
Ketones, POC UA: NEGATIVE mg/dL
Leukocytes, UA: NEGATIVE
Nitrite, UA: NEGATIVE
Protein Ur, POC: NEGATIVE mg/dL
Specific Gravity, Urine: 1.015
Urobilinogen, Ur: NEGATIVE
pH, UA: 6 (ref 5.0–8.0)

## 2020-09-06 NOTE — Progress Notes (Signed)
° °  Subjective:    Patient ID: Tony Mcmillan, male    DOB: 05-Mar-1941, 79 y.o.   MRN: [id number]  HPI Chief Complaint  Patient presents with   follow-up on kidney function    follow-up on kidney function   He is here to follow-up on recently elevated serum creatinine at his rheumatology office. States he has not had any water prior to his visit and he thinks he was feeling dehydrated.  He denies taking any NSAIDs.  Reports feeling well and denies any symptoms today.  Denies fever, chills, dizziness, chest pain, palpitations, shortness of breath, abdominal pain, N/V/D, urinary symptoms, LE edema.     Review of Systems Pertinent positives and negatives in the history of present illness.     Objective:   Physical Exam BP 120/70    Pulse 74    Wt 150 lb (68 kg)    BMI 26.57 kg/m   Alert and oriented and in no acute distress.  Respirations unlabored.  Not otherwise examined.      Assessment & Plan:  Elevated serum creatinine - Plan: Basic metabolic panel, POCT Urinalysis DIP (Proadvantage Device)  Rheumatoid arthritis involving multiple sites, unspecified whether rheumatoid factor present (HCC)  Urinalysis dipstick is negative. He is in his usual state of health. He reports feeling well hydrated and has been consuming water this morning.  We will recheck renal function and follow-up.  He has a history of benign renal cysts per EMR.

## 2020-09-07 LAB — BASIC METABOLIC PANEL
BUN/Creatinine Ratio: 15 (ref 10–24)
BUN: 21 mg/dL (ref 8–27)
CO2: 24 mmol/L (ref 20–29)
Calcium: 9.4 mg/dL (ref 8.6–10.2)
Chloride: 100 mmol/L (ref 96–106)
Creatinine, Ser: 1.41 mg/dL — ABNORMAL HIGH (ref 0.76–1.27)
GFR calc Af Amer: 55 mL/min/{1.73_m2} — ABNORMAL LOW (ref >59)
GFR calc non Af Amer: 47 mL/min/{1.73_m2} — ABNORMAL LOW (ref >59)
Glucose: 108 mg/dL — ABNORMAL HIGH (ref 65–99)
Potassium: 5.1 mmol/L (ref 3.5–5.2)
Sodium: 136 mmol/L (ref 134–144)

## 2020-09-12 NOTE — Progress Notes (Signed)
Office Visit Note  Patient: Tony Mcmillan             Date of Birth: 12-Nov-1941           MRN: [id number]             PCP: Girtha Rm, NP-C Referring: Girtha Rm, NP-C Visit Date: 09/26/2020 Occupation: @GUAROCC @  Subjective:  Left wrist joint pain   History of Present Illness: Tony Mcmillan is a 79 y.o. male with history of seronegative rheumatoid arthritis and DDD. He is taking rinvoq 15 mg 1 tablet by mouth daily. He continues to tolerate rinvoq without any side effects.  He has persistent pain, inflammation, and stiffness in the left wrist joint.  He has also noticed intermittent discomfort in the left shoulder, which has been tolerable. He states at night he experiences muscle cramps on occasion but denies any muscle spasms or muscle weakness.  He has been avoiding the use of NSAIDs.  He has received all 3 covid-19 vaccine doses.    Activities of Daily Living:  Patient reports morning stiffness for 30  minutes.   Patient Reports nocturnal pain.  Difficulty dressing/grooming: Denies Difficulty climbing stairs: Denies Difficulty getting out of chair: Denies Difficulty using hands for taps, buttons, cutlery, and/or writing: Reports  Review of Systems  Constitutional: Negative for fatigue and night sweats.  HENT: Negative for mouth sores, mouth dryness and nose dryness.   Eyes: Negative for pain, redness, itching and dryness.  Respiratory: Positive for cough and shortness of breath. Negative for difficulty breathing.   Cardiovascular: Negative for chest pain, palpitations, hypertension, irregular heartbeat and swelling in legs/feet.  Gastrointestinal: Positive for constipation. Negative for blood in stool and diarrhea.  Endocrine: Negative for increased urination.  Genitourinary: Negative for difficulty urinating and painful urination.  Musculoskeletal: Positive for arthralgias, joint pain, joint swelling and morning stiffness. Negative for myalgias, muscle weakness,  muscle tenderness and myalgias.  Skin: Negative for color change, rash, hair loss, nodules/bumps, redness, skin tightness, ulcers and sensitivity to sunlight.  Allergic/Immunologic: Negative for susceptible to infections.  Neurological: Negative for fainting, numbness, headaches, memory loss, night sweats and weakness.  Hematological: Negative for bruising/bleeding tendency and swollen glands.  Psychiatric/Behavioral: Negative for depressed mood, confusion and sleep disturbance. The patient is not nervous/anxious.     PMFS History:  Patient Active Problem List   Diagnosis Date Noted  . CAD (coronary artery disease)   . Enlarged prostate 03/10/2020  . Aortic valve calcification 03/10/2020  . 3-vessel coronary artery disease 03/10/2020  . Bladder wall thickening 03/10/2020  . History of 2019 novel coronavirus disease (COVID-19) 12/13/2019  . COVID-19 virus infection 11/15/2019  . Cough 11/15/2019  . Weight loss 11/15/2019  . Bilateral carotid bruits 02/09/2019  . Chest pain 02/08/2019  . Intermittent palpitations 01/29/2019  . Prediabetes 10/29/2018  . History of systemic steroid therapy 10/30/2017  . Feeling of chest tightness, unclear cause, ? due to RA 02/21/2017  . Pulmonary emphysema ([place]) 12/18/2016  . Dyspnea and respiratory abnormality 11/25/2016  . 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[place]) 11/04/2016  . High risk medication use 11/04/2016  . 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 Vitamin D deficiency 11/04/2016  . 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 History of anemia 07/02/2016  . History of colonic polyps 07/02/2016  . History of gastric ulcer 07/02/2016  . Asymptomatic microscopic hematuria 04/03/2016  . Benign prostatic hyperplasia 04/03/2016  . Renal cyst, left 04/03/2016  . 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  DDD (degenerative disc disease), lumbar 04/03/2016  . Colonic polyp 02/23/2016  . Duodenal ulcer 02/23/2016    Past  Medical History:  Diagnosis Date  . 3-vessel coronary artery disease 03/10/2020  . Anemia   . Aortic valve calcification 03/10/2020  . Arthritis   . Asymptomatic microscopic hematuria 04/03/2016   Cystoscopy 02/207 Dr. Pilar Jarvis [facility].    Marland Kitchen Atypical chest pain    a. 09/2016 MV: EF 63%, normal perfusion.  . Benign prostatic hyperplasia 04/03/2016   Asymptomatic. Normal PSA and exam by Dr. Pilar Jarvis [facility] 01/2016.    . Bilateral carotid bruits 02/09/2019  . Bladder wall thickening 03/10/2020  . CAD (coronary artery disease)    Coronary artery calcification on Chest CT in 09/2016 and 01/2019 // Myoview 10/17: normal perfusion // Myoview 01/2019: no ischemia or infarction, EF 58 // Coronary CTA 03/2020: Calcium Score 1067; non-obstructive CAD [oLM 1-24, LAD ost 25049, mid and dist 1-24; D1 1-24; LCx prox and mid 1-24, OM1 and OM2 25-49, RCA and PLB 1-24; PLA 1-24]   . Carotid bruit    Carotid Dopplers 01/2019: no ICA stenosis bilaterally   . Colonic polyp 02/23/2016  . COVID-19   . DDD (degenerative disc disease), 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 L4-5 and L5-S1   . Diastolic dysfunction    a. 2015 Echo: EF >55%;  b. 09/2016 Echo: EF 55-60%, no rwma, Gr1 DD, triv AI/MR, mild TR, PASP 70mmHg.  Marland Kitchen 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  . Duodenal ulcer 02/23/2016  . Emphysema of lung ([place]) 02/08/2016  . Enlarged prostate 03/10/2020  . GERD (gastroesophageal reflux disease)   . History of systemic steroid therapy 10/30/2017  . Hx of Blood Transfusion   . Intermittent palpitations 01/29/2019  . 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 Palpitations    Echo 10/17: Mild focal basal septal hypertrophy, EF 55-60, no RWMA, Gr 1 DD, trivial MR, normal RVSF, mild TR, PASP 28 // Event monitor 01/2019: NSR, PACs  . Prediabetes 10/29/2018  . Pulmonary emphysema ([place]) 12/18/2016  . Renal cyst, left 04/03/2016   On CT, benign   . Rheumatoid arthritis ([place])   .  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[place]) 11/04/2016   -RF,-CCP,-1433eta, erosive disease with contractures  . Tubular adenoma of colon 01/2016  . Vitamin D deficiency 11/04/2016    Family History  Problem Relation Age of Onset  . Hypertension Mother   . Hypertension Father   . Diabetes Father   . Heart disease Father        stents  . Hyperlipidemia Sister   . Diabetes Sister   . Stroke Brother   . Diabetes Brother   . Heart disease Brother        stents  . Colon polyps Neg Hx   . Esophageal cancer Neg Hx   . Stomach cancer Neg Hx   . Rectal cancer Neg Hx   . Colon cancer Neg Hx    Past Surgical History:  Procedure Laterality Date  . ANTERIOR CERVICAL CORPECTOMY    . ANTERIOR CERVICAL CORPECTOMY  12/2014   for infection. this was in [place]  . CATARACT EXTRACTION     both eyes  . COLOSTOMY REVERSAL     had infection on buttocks and had to have a skin graft- had colostomy to help area stay clean and heal  . HERNIA REPAIR    . SPINE SURGERY     Social History  Social History Narrative   Retired. Lives alone.    Immunization History  Administered Date(s) Administered  . Influenza, High Dose Seasonal PF 07/23/2017, 08/05/2019, 08/30/2020  . Influenza,inj,Quad PF,6+ Mos 08/01/2017  . Influenza-Unspecified 10/30/2015, 09/13/2016, 07/30/2018, 08/05/2019  . PFIZER SARS-COV-2 Vaccination 01/28/2020, 02/18/2020  . Pneumococcal Conjugate-13 12/05/2015  . Pneumococcal Polysaccharide-23 04/23/2017  . Tdap 12/09/2015  . Zoster 12/09/2015     Objective: Vital Signs: BP 118/76 (BP Location: Left Arm, Patient Position: Sitting, Cuff Size: Normal)   Pulse 82   Resp 16   Ht 5\' 2"  (1.575 m)   Wt 151 lb 3.2 oz (68.6 kg)   BMI 27.65 kg/m    Physical Exam Vitals and nursing note reviewed.  Constitutional:      Appearance: He is well-developed.  HENT:     Head: Normocephalic and atraumatic.  Eyes:     Conjunctiva/sclera: Conjunctivae normal.     Pupils: Pupils are  equal, round, and reactive to light.  Pulmonary:     Effort: Pulmonary effort is normal.  Abdominal:     Palpations: Abdomen is soft.  Musculoskeletal:     Cervical back: Normal range of motion and neck supple.  Skin:    General: Skin is warm and dry.     Capillary Refill: Capillary refill takes less than 2 seconds.  Neurological:     Mental Status: He is alert and oriented to person, place, and time.  Psychiatric:        Behavior: Behavior normal.      Musculoskeletal Exam: C-spine limited ROM.  Thoracic and lumbar spine slightly limited ROM.  Shoulder joints full abduction with some discomfort and stiffness bilaterally.  Bilateral elbow joint contractures noted. No tenderness or inflammation of elbow joints.  Right wrist limited ROM with synovial thickening.  Left wrist limited ROM with extensor tenosynovitis of the left wrist.  Synovial thickening of the left wrist noted.  Knee joints good ROM with no warmth or effusion.  Ankle joints good ROM with no tenderness or inflammation.   CDAI Exam: CDAI Score: 2.9  Patient Global: 6 mm; Provider Global: 3 mm Swollen: 1 ; Tender: 1  Joint Exam 09/26/2020      Right  Left  Wrist     Swollen Tender     Investigation: No additional findings.  Imaging: No results found.  Recent Labs: Lab Results  Component Value Date   WBC 5.9 09/01/2020   HGB 12.9 (L) 09/01/2020   PLT 310 09/01/2020   NA 136 09/06/2020   K 5.1 09/06/2020   CL 100 09/06/2020   CO2 24 09/06/2020   GLUCOSE 108 (H) 09/06/2020   BUN 21 09/06/2020   CREATININE 1.41 (H) 09/06/2020   BILITOT 0.9 09/01/2020   ALKPHOS 52 05/03/2020   AST 26 09/01/2020   ALT 25 09/01/2020   PROT 7.0 09/01/2020   ALBUMIN 4.3 05/03/2020   CALCIUM 9.4 09/06/2020   GFRAA 55 (L) 09/06/2020   QFTBGOLDPLUS NEGATIVE 09/01/2020    Speciality Comments: No specialty comments available.  Procedures:  No procedures performed Allergies: Patient has no known allergies.   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 Ongoing tenderness and inflammation was noted on exam.  He has limited ROM and synovial thickening of both wrist joints.  He is currently taking Rinvoq 15 mg 1 tablet by mouth daily.  He is tolerating rinvoq  and has not missed any doses recently.  He has started to experience intermittent discomfort in his left shoulder joint but has good ROM on exam today with no tenderness.  He declined x-rays of the left shoulder for further evaluation.  He will be scheduled for an ultrasound guided left wrist injection.  His last left intercarpal injection was performed on 08/26/18, which alleviated some of his discomfort and inflammation.  He will continue taking rinvoq 15 mg 1 tablet by mouth daily.  He was advised to notify us if he develops increased joint pain or joint swelling.  He will follow up in 5 months.  - Plan: Ambulatory referral to Pulmonology  High risk medication use - Rinvoq 15 mg 1 tablet by mouth daily.  Discontinue methotrexate 0.3 mL subcutaneous injections once weekly due to abnormal kidney function and low white blood cell count. WBC count was 5.9 on 09/01/20.  Absolute lymphocytes remain low but stable-643.  We will continue to monitor lab work closely.  He will be due to update lab work in December and every 3 months to monitor for drug toxicity.  TB gold negative on 09/01/2020 and will continue to be monitored yearly. He has not had any recent infections.  He was advised to hold Rinvoq if he develops signs or symptoms of an infection and to resume once infection has completely cleared.  He has received all 3 doses of the COVID-19 vaccine.  He was advised to notify us or his PCP if he develops a COVID-19 infection in order to receive the antibody infusion.  He voiced understanding.   Lymphopenia: Absolute lymphocyte count was 643.  White  blood cell count was 5.9.  We will continue to monitor CBC closely.  DDD (degenerative disc disease), cervical: He has limited range of motion with lateral rotation.  No symptoms of radiculopathy at this time.  DDD (degenerative disc disease), lumbar: He experiences intermittent discomfort and stiffness in his lower back.  He has no symptoms of radiculopathy.  He uses a cane to assist with ambulation.  Osteopenia of multiple sites: He continues to take a calcium and vitamin D supplement as recommended.  Elevated CK: CK was 237 on 04/21/2019.  He is not experiencing any muscle weakness at this time.  He has intermittent muscle cramps and was encouraged to try magnesium malate 250 mg at bedtime as needed.  He can increase the dose as tolerated.  We discussed diarrhea and drowsiness or potential side effects.  History of vitamin D deficiency: Vitamin D was 43.5 on 04/07/2020.  He continues to take a calcium and vitamin D supplement.  Cough -He has been experiencing a worsening cough recently.  He was previously evaluated by Dr. Chase Caller but has not followed up since 2019.  A referral to Dr. Chase Caller will be placed today for further evaluation and management.  plan: Ambulatory referral to Pulmonology  SOB (shortness of breath) on exertion -He has noticed increased shortness of breath with exertion.  He has known history of COPD and was previously followed by Dr. Chase Caller.  Due to his history of rheumatoid arthritis and experiencing a worsening cough and increased shortness of breath on exertion a referral to Dr. Chase Caller will be placed today for further evaluation and management.  plan: Ambulatory referral to Pulmonology  Other medical conditions are listed as follows:   History of colonic polyps  History of gastric ulcer  History of COPD - Plan: Ambulatory referral to Pulmonology  Asymptomatic microscopic hematuria  History of  BPH  Dyslipidemia    Orders: Orders Placed This Encounter   Procedures  . Ambulatory referral to Pulmonology   No orders of the defined types were placed in this encounter.     Follow-Up Instructions: Return in about 5 months (around 02/24/2021) for Rheumatoid arthritis, DDD.   Ofilia Neas, PA-C  Note - This record has been created using Dragon software.  Chart creation errors have been sought, but may not always  have been located. Such creation errors do not reflect on  the standard of medical care.

## 2020-09-26 ENCOUNTER — Other Ambulatory Visit: Payer: Self-pay

## 2020-09-26 ENCOUNTER — Ambulatory Visit (INDEPENDENT_AMBULATORY_CARE_PROVIDER_SITE_OTHER): Payer: Medicare HMO | Admitting: Physician Assistant

## 2020-09-26 ENCOUNTER — Encounter: Payer: Self-pay | Admitting: Physician Assistant

## 2020-09-26 VITALS — BP 118/76 | HR 82 | Resp 16 | Ht 62.0 in | Wt 151.2 lb

## 2020-09-26 DIAGNOSIS — Z860100 Personal history of colon polyps, unspecified: Secondary | ICD-10-CM

## 2020-09-26 DIAGNOSIS — R059 Cough, unspecified: Secondary | ICD-10-CM

## 2020-09-26 DIAGNOSIS — R748 Abnormal levels of other serum enzymes: Secondary | ICD-10-CM

## 2020-09-26 DIAGNOSIS — Z87438 Personal history of other diseases of male genital organs: Secondary | ICD-10-CM

## 2020-09-26 DIAGNOSIS — Z8711 Personal history of peptic ulcer disease: Secondary | ICD-10-CM

## 2020-09-26 DIAGNOSIS — D72810 Lymphocytopenia: Secondary | ICD-10-CM | POA: Diagnosis not present

## 2020-09-26 DIAGNOSIS — M5030 Other cervical disc degeneration, unspecified cervical region: Secondary | ICD-10-CM

## 2020-09-26 DIAGNOSIS — M8589 Other specified disorders of bone density and structure, multiple sites: Secondary | ICD-10-CM

## 2020-09-26 DIAGNOSIS — M51369 Other intervertebral disc degeneration, lumbar region without mention of lumbar back pain or lower extremity pain: Secondary | ICD-10-CM

## 2020-09-26 DIAGNOSIS — Z79899 Other long term (current) drug therapy: Secondary | ICD-10-CM

## 2020-09-26 DIAGNOSIS — R3121 Asymptomatic microscopic hematuria: Secondary | ICD-10-CM

## 2020-09-26 DIAGNOSIS — E785 Hyperlipidemia, unspecified: Secondary | ICD-10-CM

## 2020-09-26 DIAGNOSIS — M0609 Rheumatoid arthritis without rheumatoid factor, multiple sites: Secondary | ICD-10-CM

## 2020-09-26 DIAGNOSIS — Z8639 Personal history of other endocrine, nutritional and metabolic disease: Secondary | ICD-10-CM

## 2020-09-26 DIAGNOSIS — R0602 Shortness of breath: Secondary | ICD-10-CM

## 2020-09-26 DIAGNOSIS — M5136 Other intervertebral disc degeneration, lumbar region: Secondary | ICD-10-CM

## 2020-09-26 DIAGNOSIS — Z8709 Personal history of other diseases of the respiratory system: Secondary | ICD-10-CM

## 2020-09-26 NOTE — Patient Instructions (Addendum)
Magnesium malate 250 mg at bedtime  May cause drowsiness and diarrhea Start at low dose and increase as tolerated    COVID-19 vaccine recommendations:   COVID-19 vaccine is recommended for everyone (unless you are allergic to a vaccine component), even if you are on a medication that suppresses your immune system.   If you are on Methotrexate, Cellcept (mycophenolate), Rinvoq, Morrie Sheldon, and Olumiant- hold the medication for 1 week after each vaccine. Hold Methotrexate for 2 weeks after the single dose COVID-19 vaccine.   If you are on Orencia subcutaneous injection - hold medication one week prior to and one week after the first COVID-19 vaccine dose (only).   If you are on Orencia IV infusions- time vaccination administration so that the first COVID-19 vaccination will occur four weeks after the infusion and postpone the subsequent infusion by one week.   If you are on Cyclophosphamide or Rituxan infusions please contact your doctor prior to receiving the COVID-19 vaccine.   Do not take Tylenol or any anti-inflammatory medications (NSAIDs) 24 hours prior to the COVID-19 vaccination.   There is no direct evidence about the efficacy of the COVID-19 vaccine in individuals who are on medications that suppress the immune system.   Even if you are fully vaccinated, and you are on any medications that suppress your immune system, please continue to wear a mask, maintain at least six feet social distance and practice hand hygiene.   If you develop a COVID-19 infection, please contact your PCP or our office to determine if you need antibody infusion.  The booster vaccine is now available for immunocompromised patients. It is advised that if you had Pfizer vaccine you should get [employer] booster.  If you had a Moderna vaccine then you should get a Moderna booster. Johnson and Wynetta Emery does not have a booster vaccine at this time.  Please see the following web sites for updated information.    https://[email or web address]/[email or web address]/[email or web address]-Vaccination-Patient-Resources.[email or web address]  [email or web address]-[email or web address]/[email or web address]  Standing Labs We placed an order today for your standing lab work.   Please have your standing labs drawn in December and every 3 months  If possible, please have your labs drawn 2 weeks prior to your appointment so that the provider can discuss your results at your appointment.  We have open lab daily Monday through Thursday from 8:30-12:30 PM and 1:30-4:30 PM and Friday from 8:30-12:30 PM and 1:30-4:00 PM at the office of Dr. Bo Merino, [facility].   Please be advised, patients with office appointments requiring lab work will take precedents over walk-in lab work.  If possible, please come for your lab work on Monday and Friday afternoons, as you may experience shorter wait times. The office is located at [address] [place], [place], [place] [address] No appointment is necessary.   Labs are drawn by [facility]. Please bring your co-pay at the time of your lab draw.  You may receive a bill from [facility] for your lab work.  If you wish to have your labs drawn at another location, please call the office 24 hours in advance to send orders.  If you have any questions regarding directions or hours of operation,  please call [phone].   As a reminder, please drink plenty of water prior to coming for your lab work. Thanks!

## 2020-10-04 ENCOUNTER — Ambulatory Visit (INDEPENDENT_AMBULATORY_CARE_PROVIDER_SITE_OTHER): Payer: Medicare HMO | Admitting: Rheumatology

## 2020-10-04 ENCOUNTER — Other Ambulatory Visit: Payer: Self-pay

## 2020-10-04 ENCOUNTER — Ambulatory Visit: Payer: Self-pay

## 2020-10-04 DIAGNOSIS — M25532 Pain in left wrist: Secondary | ICD-10-CM | POA: Diagnosis not present

## 2020-10-04 NOTE — Progress Notes (Signed)
   Procedure Note  Patient: Tony Mcmillan             Date of Birth: September 27, 1941           MRN: [id number]             Visit Date: 10/04/2020  Procedures: Visit Diagnoses:  1. Pain in left wrist     Medium Joint Inj: L radiocarpal on 10/04/2020 3:33 PM Indications: pain and joint swelling Details: 27 G 1.5 in needle, ultrasound-guided dorsal approach Medications: 1 mL lidocaine 1 %; 30 mg triamcinolone acetonide 40 MG/ML Aspirate: 0 mL Procedure, treatment alternatives, risks and benefits explained, specific risks discussed. Immediately prior to procedure a time out was called to verify the correct patient, procedure, equipment, support staff and site/side marked as required. Patient was prepped and draped in the usual sterile fashion.     Patient tolerated the procedure well.  Postprocedure instructions were given.  Bo Merino, MD

## 2020-10-05 ENCOUNTER — Other Ambulatory Visit: Payer: Self-pay | Admitting: Physician Assistant

## 2020-10-05 ENCOUNTER — Other Ambulatory Visit: Payer: Medicare HMO | Admitting: Rheumatology

## 2020-10-05 DIAGNOSIS — E7800 Pure hypercholesterolemia, unspecified: Secondary | ICD-10-CM

## 2020-10-05 MED ORDER — LIDOCAINE HCL 1 % IJ SOLN
1.0000 mL | INTRAMUSCULAR | Status: AC | PRN
  Administered 2020-10-04: 1 mL

## 2020-10-05 MED ORDER — TRIAMCINOLONE ACETONIDE 40 MG/ML IJ SUSP
30.0000 mg | INTRAMUSCULAR | Status: AC | PRN
  Administered 2020-10-04: 30 mg via INTRA_ARTICULAR

## 2020-10-09 ENCOUNTER — Ambulatory Visit (INDEPENDENT_AMBULATORY_CARE_PROVIDER_SITE_OTHER): Payer: Medicare HMO | Admitting: Family Medicine

## 2020-10-09 ENCOUNTER — Encounter: Payer: Self-pay | Admitting: Pulmonary Disease

## 2020-10-09 ENCOUNTER — Encounter: Payer: Self-pay | Admitting: Family Medicine

## 2020-10-09 ENCOUNTER — Other Ambulatory Visit: Payer: Self-pay

## 2020-10-09 ENCOUNTER — Ambulatory Visit (INDEPENDENT_AMBULATORY_CARE_PROVIDER_SITE_OTHER): Payer: Medicare HMO | Admitting: Pulmonary Disease

## 2020-10-09 ENCOUNTER — Ambulatory Visit (INDEPENDENT_AMBULATORY_CARE_PROVIDER_SITE_OTHER): Payer: Medicare HMO

## 2020-10-09 VITALS — BP 142/70 | HR 78 | Temp 98.2°F | Ht 62.0 in | Wt 151.8 lb

## 2020-10-09 VITALS — BP 120/68 | HR 68 | Temp 97.9°F | Resp 16 | Wt 152.0 lb

## 2020-10-09 DIAGNOSIS — R0602 Shortness of breath: Secondary | ICD-10-CM

## 2020-10-09 DIAGNOSIS — E559 Vitamin D deficiency, unspecified: Secondary | ICD-10-CM

## 2020-10-09 DIAGNOSIS — R7303 Prediabetes: Secondary | ICD-10-CM

## 2020-10-09 DIAGNOSIS — I2510 Atherosclerotic heart disease of native coronary artery without angina pectoris: Secondary | ICD-10-CM

## 2020-10-09 DIAGNOSIS — R7989 Other specified abnormal findings of blood chemistry: Secondary | ICD-10-CM | POA: Diagnosis not present

## 2020-10-09 DIAGNOSIS — Z8616 Personal history of COVID-19: Secondary | ICD-10-CM

## 2020-10-09 DIAGNOSIS — M069 Rheumatoid arthritis, unspecified: Secondary | ICD-10-CM

## 2020-10-09 DIAGNOSIS — J439 Emphysema, unspecified: Secondary | ICD-10-CM

## 2020-10-09 DIAGNOSIS — R0689 Other abnormalities of breathing: Secondary | ICD-10-CM

## 2020-10-09 MED ORDER — ALBUTEROL SULFATE HFA 108 (90 BASE) MCG/ACT IN AERS: 2.0000 | INHALATION_SPRAY | Freq: Four times a day (QID) | RESPIRATORY_TRACT | 6 refills | Status: DC | PRN

## 2020-10-09 NOTE — Assessment & Plan Note (Signed)
Left lower lobe crackles on exam today Progressive/worsening dyspnea over the last 6 to 12 months Followed by rheumatology, maintained on methotrexate and Rinvoq HRCT in 2017 did not show evidence of ILD  Plan: Walk today in office Chest x-ray today We will order pulmonary function testing 6-week follow-up with Dr. Chase Caller Continue follow-up with rheumatology Continue medications as managed by rheumatology

## 2020-10-09 NOTE — Patient Instructions (Addendum)
   Let Dr. Fuller Plan know about your acid reflux medication. [facility] phone number [phone]

## 2020-10-09 NOTE — Progress Notes (Signed)
@Patient  ID: Tony Mcmillan, male    DOB: [birth year], 79 y.o.   MRN: [id number]  Chief Complaint  Patient presents with  . Follow-up    Patient has shortness of breath with exertion and feels some tighness in chest on right side, dry cough. Went to family doc this morning and was told she didn't hear lung sounds on lower right side    Referring provider: Girtha Rm, NP-C  HPI:  79 year old male former smoker followed in our office for COPD and history of COVID-19 infection  PMH: Degenerative joint disease, vitamin D deficiency, rheumatoid arthritis, history of COVID-19 infection, palpitations Smoker/ Smoking History: Former smoker.  Quit 1995.  17-pack-year smoking history Maintenance: Bevespi Pt of: Dr. Chase Caller  10/09/2020  - Visit   79 year old male former smoker followed in our office for COPD/pulmonary emphysema and history of COVID-19.  He is followed by Dr. Chase Caller.  He was last seen in our office in August/2020 by EW NP.  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 Ongoing tenderness and inflammation was noted on exam.  He has limited ROM and synovial thickening of both wrist joints.  He is currently taking Rinvoq 15 mg 1 tablet by mouth daily.  He is tolerating rinvoq and has not missed any doses recently.  He has started to experience intermittent discomfort in his left shoulder joint but has good ROM on exam today with no tenderness.  He declined x-rays of the left shoulder for further evaluation.  He will be scheduled for an ultrasound guided left wrist injection.  His last left intercarpal injection was performed on 08/26/18, which alleviated some of his discomfort and inflammation.  He will continue taking  rinvoq 15 mg 1 tablet by mouth daily.  He was advised to notify us if he develops increased joint pain or joint swelling.  He will follow up in 5 months.  - Plan: Ambulatory referral to Pulmonology  High risk medication use - Rinvoq 15 mg 1 tablet by mouth daily.  Discontinue methotrexate 0.3 mL subcutaneous injections once weekly due to abnormal kidney function and low white blood cell count. WBC count was 5.9 on 09/01/20.  Absolute lymphocytes remain low but stable-643.  We will continue to monitor lab work closely.  He will be due to update lab work in December and every 3 months to monitor for drug toxicity.  TB gold negative on 09/01/2020 and will continue to be monitored yearly. He has not had any recent infections.  He was advised to hold Rinvoq if he develops signs or symptoms of an infection and to resume once infection has completely cleared.  He has received all 3 doses of the COVID-19 vaccine.  He was advised to notify us or his PCP if he develops a COVID-19 infection in order to receive the antibody infusion.  He voiced understanding.   Lymphopenia: Absolute lymphocyte count was 643.  White blood cell count was 5.9.  We will continue to monitor CBC closely.  DDD (degenerative disc disease), cervical: He has limited range of motion with lateral rotation.  No symptoms of radiculopathy at this time.  DDD (degenerative disc disease), lumbar: He experiences intermittent discomfort and stiffness in his lower back.  He has no symptoms of  radiculopathy.  He uses a cane to assist with ambulation.  Osteopenia of multiple sites: He continues to take a calcium and vitamin D supplement as recommended.  Elevated CK: CK was 237 on 04/21/2019.  He is not experiencing any muscle weakness at this time.  He has intermittent muscle cramps and was encouraged to try magnesium malate 250 mg at bedtime as needed.  He can increase the dose as tolerated.  We discussed diarrhea and drowsiness or potential side  effects.  History of vitamin D deficiency: Vitamin D was 43.5 on 04/07/2020.  He continues to take a calcium and vitamin D supplement.  Cough -He has been experiencing a worsening cough recently.  He was previously evaluated by Dr. Chase Caller but has not followed up since 2019.  A referral to Dr. Chase Caller will be placed today for further evaluation and management.  plan: Ambulatory referral to Pulmonology  SOB (shortness of breath) on exertion -He has noticed increased shortness of breath with exertion.  He has known history of COPD and was previously followed by Dr. Chase Caller.  Due to his history of rheumatoid arthritis and experiencing a worsening cough and increased shortness of breath on exertion a referral to Dr. Chase Caller will be placed today for further evaluation and management.  plan: Ambulatory referral to Pulmonology  Patient saw primary care today.  They wanted the patient to present back to our office.  Patient was then scheduled.  Primary care felt that patient had diminished breath sounds right lower lobe.  Patient reports that he had COVID-19 in October/2020.  He is now fully vaccinated with his booster shot for COVID-19.  He did not require hospitalization with his COVID-19 illness in October/2020.  Patient is noticed progressive dyspnea over the last 6 to 12 months.  He continues to report adherence to [facility].  Questionaires / Pulmonary Flowsheets:   ACT:  No flowsheet data found.  MMRC: mMRC Dyspnea Scale mMRC Score  10/09/2020 2    Epworth:  No flowsheet data found.  Tests:   11/26/2016-pulmonary function test-FVC 1.79 (65% predicted), postbronchodilator ratio 82, postbronchodilator FEV1 1.81 (65% predicted), no bronchodilator response, DLCO 12.16 (53% predicted)  12/03/2016-CT chest high-res-no findings to suggest interstitial lung disease, mild diffuse bronchial wall thickening with mild centrilobular emphysema, imaging findings suggestive of underlying COPD,  focal area of scarring in the posterior aspect of the right upper lobe likely from prior infection, aortic arthrosclerosis, mild calcifications of aortic valve  03/22/2019-cardiac telemetry-normal sinus rhythm, occasional PACs  10/14/2016-echocardiogram-LV ejection fraction 55 to 67%, grade 1 diastolic dysfunction, right systolic pressure 28  FENO:  No results found for: NITRICOXIDE  PFT: PFT Results Latest Ref Rng & Units 11/26/2016  FVC-Pre L 1.79  FVC-Predicted Pre % 65  FVC-Post L 1.81  FVC-Predicted Post % 65  Pre FEV1/FVC % % 81  Post FEV1/FCV % % 82  FEV1-Pre L 1.46  FEV1-Predicted Pre % 73  FEV1-Post L 1.48  DLCO uncorrected ml/min/mmHg 12.16  DLCO UNC% % 53  DLCO corrected ml/min/mmHg 12.31  DLCO COR %Predicted % 53  DLVA Predicted % 98  TLC L 3.64  TLC % Predicted % 64  RV % Predicted % 86    WALK:  SIX MIN WALK 10/09/2020 04/22/2017 01/16/2017 11/25/2016  Supplimental Oxygen during Test? (L/min) No - - No  2 Minute Oxygen Saturation % - 95 94 -  2 Minute HR - 122 126 -  4 Minute Oxygen Saturation % - 95 95 -  4 Minute HR - 124  118 -  6 Minute Oxygen Saturation % - 94 96 -  6 Minute HR - 109 127 -  Tech Comments: Patient walked slow pace with cane and maintained good sats, expressed shortness of breath after first lap but was able to complete 2 laps total - - Pt ambulated at a slow pace.     Imaging: US Guided Needle Placement  Result Date: 10/05/2020 Please refer to the procedure note.   Lab Results:  CBC    Component Value Date/Time   WBC 5.9 09/01/2020 1449   RBC 4.42 09/01/2020 1449   HGB 12.9 (L) 09/01/2020 1449   HGB 13.8 04/07/2020 1149   HCT 38.9 09/01/2020 1449   HCT 42.3 04/07/2020 1149   PLT 310 09/01/2020 1449   PLT 245 04/07/2020 1149   MCV 88.0 09/01/2020 1449   MCV 85 04/07/2020 1149   MCH 29.2 09/01/2020 1449   MCHC 33.2 09/01/2020 1449   RDW 14.7 09/01/2020 1449   RDW 15.7 (H) 04/07/2020 1149   LYMPHSABS 643 (L) 09/01/2020 1449    LYMPHSABS 0.8 04/07/2020 1149   MONOABS 392 07/14/2017 1123   EOSABS 53 09/01/2020 1449   EOSABS 0.1 04/07/2020 1149   BASOSABS 18 09/01/2020 1449   BASOSABS 0.0 04/07/2020 1149    BMET    Component Value Date/Time   NA 136 09/06/2020 1141   K 5.1 09/06/2020 1141   CL 100 09/06/2020 1141   CO2 24 09/06/2020 1141   GLUCOSE 108 (H) 09/06/2020 1141   GLUCOSE 105 (H) 09/01/2020 1449   BUN 21 09/06/2020 1141   CREATININE 1.41 (H) 09/06/2020 1141   CREATININE 1.63 (H) 09/01/2020 1449   CALCIUM 9.4 09/06/2020 1141   GFRNONAA 47 (L) 09/06/2020 1141   GFRNONAA 40 (L) 09/01/2020 1449   GFRAA 55 (L) 09/06/2020 1141   GFRAA 46 (L) 09/01/2020 1449    BNP No results found for: BNP  ProBNP No results found for: PROBNP  Specialty Problems      Pulmonary Problems   Shortness of breath   Pulmonary emphysema (HCC)   Cough      No Known Allergies  Immunization History  Administered Date(s) Administered  . Influenza, High Dose Seasonal PF 07/23/2017, 08/05/2019, 08/30/2020  . Influenza,inj,Quad PF,6+ Mos 08/01/2017  . Influenza-Unspecified 10/30/2015, 09/13/2016, 07/30/2018, 08/05/2019  . PFIZER SARS-COV-2 Vaccination 01/28/2020, 02/18/2020, 09/19/2020  . Pneumococcal Conjugate-13 12/05/2015  . Pneumococcal Polysaccharide-23 04/23/2017  . Tdap 12/09/2015  . Zoster 12/09/2015    Past Medical History:  Diagnosis Date  . 3-vessel coronary artery disease 03/10/2020  . Anemia   . Aortic valve calcification 03/10/2020  . Arthritis   . Asymptomatic microscopic hematuria 04/03/2016   Cystoscopy 02/207 Dr. Pilar Jarvis [facility].    Marland Kitchen Atypical chest pain    a. 09/2016 MV: EF 63%, normal perfusion.  . Benign prostatic hyperplasia 04/03/2016   Asymptomatic. Normal PSA and exam by Dr. Pilar Jarvis [facility] 01/2016.    . Bilateral carotid bruits 02/09/2019  . Bladder wall thickening 03/10/2020  . CAD (coronary artery disease)    Coronary artery calcification on Chest CT in  09/2016 and 01/2019 // Myoview 10/17: normal perfusion // Myoview 01/2019: no ischemia or infarction, EF 58 // Coronary CTA 03/2020: Calcium Score 1067; non-obstructive CAD [oLM 1-24, LAD ost 25049, mid and dist 1-24; D1 1-24; LCx prox and mid 1-24, OM1 and OM2 25-49, RCA and PLB 1-24; PLA 1-24]   . Carotid bruit    Carotid Dopplers 01/2019: no ICA stenosis bilaterally   .  Colonic polyp 02/23/2016  . COVID-19   . DDD (degenerative disc disease), 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 L4-5 and L5-S1   . Diastolic dysfunction    a. 2015 Echo: EF >55%;  b. 09/2016 Echo: EF 55-60%, no rwma, Gr1 DD, triv AI/MR, mild TR, PASP 29mmHg.  Marland Kitchen 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  . Duodenal ulcer 02/23/2016  . Emphysema of lung ([place]) 02/08/2016  . Enlarged prostate 03/10/2020  . GERD (gastroesophageal reflux disease)   . History of systemic steroid therapy 10/30/2017  . Hx of Blood Transfusion   . Intermittent palpitations 01/29/2019  . 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 Palpitations    Echo 10/17: Mild focal basal septal hypertrophy, EF 55-60, no RWMA, Gr 1 DD, trivial MR, normal RVSF, mild TR, PASP 28 // Event monitor 01/2019: NSR, PACs  . Prediabetes 10/29/2018  . Pulmonary emphysema ([place]) 12/18/2016  . Renal cyst, left 04/03/2016   On CT, benign   . Rheumatoid arthritis ([place])   . 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[place]) 11/04/2016   -RF,-CCP,-[id number], erosive disease with contractures  . Tubular adenoma of colon 01/2016  . Vitamin D deficiency 11/04/2016    Tobacco History: Social History   Tobacco Use  Smoking Status Former Smoker  . Packs/day: 1.00  . Years: 17.00  . Pack years: 17.00  . Types: Cigarettes  . Quit date: 12/23/1993  . Years since quitting: 26.8  Smokeless Tobacco Never Used   Counseling given: Yes   Continue to not smoke  Outpatient Encounter Medications as of 10/09/2020  Medication Sig  . Ascorbic Acid (VITAMIN C PO) Take by  mouth daily.  Marland Kitchen aspirin EC 81 MG tablet Take 1 tablet (81 mg total) by mouth daily.  Marland Kitchen atorvastatin (LIPITOR) 40 MG tablet TAKE 1 TABLET BY MOUTH EVERY DAY  . Calcium Carb-Cholecalciferol (CALCIUM-VITAMIN D3) 600-400 MG-UNIT TABS Take by mouth.  . Ferrous Sulfate (IRON PO) Take by mouth daily.  . Glycopyrrolate-Formoterol (BEVESPI AEROSPHERE) 9-4.8 MCG/ACT AERO Inhale 2 puffs into the lungs 2 (two) times daily.  . metoprolol tartrate (LOPRESSOR) 50 MG tablet Take 50 mg by mouth daily.  Marland Kitchen MYRBETRIQ 50 MG TB24 tablet Take 50 mg by mouth daily.   . nitroGLYCERIN (NITROSTAT) 0.4 MG SL tablet Place 1 tablet (0.4 mg total) under the tongue every 5 (five) minutes as needed for chest pain.  . pantoprazole (PROTONIX) 40 MG tablet Take 1 tablet (40 mg total) by mouth daily.  Marland Kitchen Upadacitinib ER (RINVOQ) 15 MG TB24 Take 1 tablet by mouth daily.   No facility-administered encounter medications on file as of 10/09/2020.     Review of Systems  Review of Systems  Constitutional: Positive for fatigue. Negative for activity change, chills, fever and unexpected weight change.  HENT: Negative for postnasal drip, rhinorrhea, sinus pressure, sinus pain and sore throat.   Eyes: Negative.   Respiratory: Positive for cough (dry ) and shortness of breath. Negative for wheezing.   Cardiovascular: Negative for chest pain and palpitations.  Gastrointestinal: Negative for constipation, diarrhea, nausea and vomiting.  Endocrine: Negative.   Genitourinary: Negative.   Musculoskeletal: Negative.   Skin: Negative.   Neurological: Negative for dizziness and headaches.  Psychiatric/Behavioral: Negative.  Negative for dysphoric mood. The patient is not nervous/anxious.   All other systems reviewed and are negative.    Physical Exam  BP (!) 142/70 (BP Location: Left Arm, Patient Position: Sitting, Cuff Size: Normal)   Pulse 78  Temp 98.2 F (36.8 C) (Temporal)   Ht 5\' 2"  (1.575 m)   Wt 151 lb 12.8 oz (68.9 kg)    SpO2 94%   BMI 27.76 kg/m   Wt Readings from Last 5 Encounters:  10/09/20 151 lb 12.8 oz (68.9 kg)  10/09/20 152 lb (68.9 kg)  09/26/20 151 lb 3.2 oz (68.6 kg)  09/06/20 150 lb (68 kg)  06/12/20 149 lb (67.6 kg)    BMI Readings from Last 5 Encounters:  10/09/20 27.76 kg/m  10/09/20 27.80 kg/m  09/26/20 27.65 kg/m  09/06/20 26.57 kg/m  06/12/20 26.39 kg/m     Physical Exam Vitals and nursing note reviewed.  Constitutional:      General: He is not in acute distress.    Appearance: Normal appearance. He is normal weight.  HENT:     Head: Normocephalic and atraumatic.     Right Ear: Hearing and external ear normal.     Left Ear: Hearing and external ear normal.     Nose: Nose normal. No mucosal edema or rhinorrhea.     Right Turbinates: Not enlarged.     Left Turbinates: Not enlarged.     Mouth/Throat:     Mouth: Mucous membranes are dry.     Pharynx: Oropharynx is clear. No oropharyngeal exudate.  Eyes:     Pupils: Pupils are equal, round, and reactive to light.  Cardiovascular:     Rate and Rhythm: Normal rate and regular rhythm.     Pulses: Normal pulses.     Heart sounds: Normal heart sounds. No murmur heard.   Pulmonary:     Effort: Pulmonary effort is normal.     Breath sounds: Examination of the right-lower field reveals decreased breath sounds. Examination of the left-lower field reveals rales. Decreased breath sounds and rales present. No wheezing.  Musculoskeletal:     Cervical back: Normal range of motion.     Right lower leg: No edema.     Left lower leg: No edema.  Lymphadenopathy:     Cervical: No cervical adenopathy.  Skin:    General: Skin is warm and dry.     Capillary Refill: Capillary refill takes less than 2 seconds.     Findings: No erythema or rash.  Neurological:     General: No focal deficit present.     Mental Status: He is alert and oriented to person, place, and time.     Motor: No weakness.     Coordination: Coordination normal.      Gait: Gait abnormal.  Psychiatric:        Mood and Affect: Mood normal.        Behavior: Behavior normal. Behavior is cooperative.        Thought Content: Thought content normal.        Judgment: Judgment normal.       Assessment & Plan:   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[place]) Left lower lobe crackles on exam today Progressive/worsening dyspnea over the last 6 to 12 months Followed by rheumatology, maintained on methotrexate and Rinvoq HRCT in 2017 did not show evidence of ILD  Plan: Walk today in office Chest x-ray today We will order pulmonary function testing 6-week follow-up with Dr. Chase Caller Continue follow-up with rheumatology Continue medications as managed by rheumatology  Pulmonary emphysema [facility]) Plan: Walk today in office Chest x-ray today Continue Bevespi We will order pulmonary function testing  Shortness of breath Progressive worsening dyspnea This is likely multifactorial -known emphysema, rheumatoid arthritis, physical  deconditioning, history of COVID-19 infection Have concerns the patient may have new onset RA ILD with left lower lobe crackles on exam today  Plan: Walk today in office Chest x-ray today Order pulmonary function testing   History of 2019 novel coronavirus disease (COVID-19) October/2020 COVID-19 infection Did not require hospitalization Did not receive monoclonal antibody infusion Fully vaccinated for COVID-19  Plan: Walk today in office Chest x-ray today We will order pulmonary function testing    Return in about 6 weeks (around 11/20/2020), or if symptoms worsen or fail to improve, for Follow up with Dr. Purnell Shoemaker, Follow up for FULL PFT - 60 min.   Lauraine Rinne, NP 10/09/2020   This appointment required 32 minutes of patient care (this includes precharting, chart review, review of results, face-to-face care, etc.).

## 2020-10-09 NOTE — Assessment & Plan Note (Signed)
Progressive worsening dyspnea This is likely multifactorial -known emphysema, rheumatoid arthritis, physical deconditioning, history of COVID-19 infection Have concerns the patient may have new onset RA ILD with left lower lobe crackles on exam today  Plan: Walk today in office Chest x-ray today Order pulmonary function testing

## 2020-10-09 NOTE — Assessment & Plan Note (Signed)
Plan: Walk today in office Chest x-ray today Continue Bevespi We will order pulmonary function testing

## 2020-10-09 NOTE — Assessment & Plan Note (Signed)
October/2020 COVID-19 infection Did not require hospitalization Did not receive monoclonal antibody infusion Fully vaccinated for COVID-19  Plan: Walk today in office Chest x-ray today We will order pulmonary function testing

## 2020-10-09 NOTE — Progress Notes (Signed)
Established Patient Office Visit  Subjective:  Patient ID: Tony Mcmillan, male    DOB: 1941-09-13  Age: 79 y.o. MRN: [id number]  CC:  Chief Complaint  Patient presents with   med check    med check,     HPI Tony Mcmillan presents for his 6 month follow-up and medication check.   Reports dry cough when he "gets hot" but states it goes away. Denies chest pain, shortness of breath, orthopnea, LE edema. His pulmonary emphysema and has not followed up with his pulmonologist.  States his rheumatologist told him he should call and schedule but he has not.  Prediabetes- last Hgb A1c 6.0%  States diet is unchanged.  Drinks diet soda.  States it has been over a month since checked sugars at home Increased urination x2 months- drinks a lot of water before bed. Gets up 3 times at night to go to the bathroom- states this is baseline Denies hypoglycemia s/s  RA: got a steroid injection 1 week ago with Dr. Estanislado Pandy (wrist)  GERD- states his Protonix is over $300 now. He will contact GI who prescribed this.    Flu shot received in August.  He also received his Covid booster.    Outpatient Medications Prior to Visit  Medication Sig Dispense Refill   Ascorbic Acid (VITAMIN C PO) Take by mouth daily.     aspirin EC 81 MG tablet Take 1 tablet (81 mg total) by mouth daily. 90 tablet 3   atorvastatin (LIPITOR) 40 MG tablet TAKE 1 TABLET BY MOUTH EVERY DAY 90 tablet 2   Calcium Carb-Cholecalciferol (CALCIUM-VITAMIN D3) 600-400 MG-UNIT TABS Take by mouth.     Ferrous Sulfate (IRON PO) Take by mouth daily.     Glycopyrrolate-Formoterol (BEVESPI AEROSPHERE) 9-4.8 MCG/ACT AERO Inhale 2 puffs into the lungs 2 (two) times daily. 10.7 g 0   metoprolol tartrate (LOPRESSOR) 50 MG tablet Take 50 mg by mouth daily.     MYRBETRIQ 50 MG TB24 tablet Take 50 mg by mouth daily.      nitroGLYCERIN (NITROSTAT) 0.4 MG SL tablet Place 1 tablet (0.4 mg total) under the tongue every 5 (five) minutes as  needed for chest pain. 25 tablet 6   pantoprazole (PROTONIX) 40 MG tablet Take 1 tablet (40 mg total) by mouth daily. 90 tablet 3   Upadacitinib ER (RINVOQ) 15 MG TB24 Take 1 tablet by mouth daily. 90 tablet 0   No facility-administered medications prior to visit.    No Known Allergies  ROS Review of Systems  Constitutional: Negative for appetite change, chills, fatigue and fever.  HENT: Negative for congestion and ear pain.   Eyes: Negative for itching.  Respiratory: Positive for cough. Negative for chest tightness and shortness of breath.   Cardiovascular: Negative for chest pain.  Gastrointestinal: Negative for abdominal pain, diarrhea and nausea.  Endocrine: Negative for polydipsia.  Genitourinary: Positive for frequency. Negative for hematuria.  Neurological: Negative for dizziness, weakness and headaches.  Psychiatric/Behavioral: Negative for sleep disturbance.      Objective:    Physical Exam Constitutional:      General: He is not in acute distress.    Appearance: Normal appearance.  HENT:     Head: Normocephalic and atraumatic.     Nose: No congestion or rhinorrhea.  Cardiovascular:     Rate and Rhythm: Normal rate and regular rhythm.     Pulses: Normal pulses.     Heart sounds: Normal heart sounds. No murmur heard.   Pulmonary:  Effort: Pulmonary effort is normal.     Comments: Decreased breath sounds, worse on right  Musculoskeletal:        General: No swelling, tenderness or signs of injury.     Right lower leg: No edema.     Left lower leg: No edema.  Skin:    General: Skin is warm and dry.  Neurological:     Mental Status: He is alert and oriented to person, place, and time. Mental status is at baseline.     Motor: No weakness.  Psychiatric:        Mood and Affect: Mood normal.     BP 120/68    Pulse 68    Temp 97.9 F (36.6 C) (Oral)    Resp 16    Wt 152 lb (68.9 kg)    SpO2 97%    BMI 27.80 kg/m  Wt Readings from Last 3 Encounters:    10/09/20 151 lb 12.8 oz (68.9 kg)  10/09/20 152 lb (68.9 kg)  09/26/20 151 lb 3.2 oz (68.6 kg)     There are no preventive care reminders to display for this patient.  Lab Results  Component Value Date   HGBA1C 6.0 (H) 04/07/2020     Assessment & Plan:   Problem List Items Addressed This Visit      Cardiovascular and Mediastinum   CAD (coronary artery disease) Continue on statin and aspirin. Followed by cardiology.      Respiratory   Pulmonary emphysema ([place])   Relevant Orders   CBC with Differential/Platelet   DG Chest 2 View Referral back to pulmonologist      Musculoskeletal and Integument   Rheumatoid arthritis involving multiple sites [facility]) Followed by rheumatology     Other   Prediabetes - Primary   Relevant Orders   CBC with Differential/Platelet   Comprehensive metabolic panel   Hemoglobin A1c   Vitamin D deficiency    Other Visit Diagnoses    Elevated serum creatinine       Relevant Orders   Comprehensive metabolic panel   Decreased breath sounds at right lung base       Relevant Orders   CBC with Differential/Platelet   DG Chest 2 View Schedule with pulmonology later today      No orders of the defined types were placed in this encounter.   Follow-up: Return in about 6 months (around 04/09/2021). pending chest XR and lab results.    Tony Dingwall, NP-C

## 2020-10-09 NOTE — Patient Instructions (Addendum)
You were seen today by Lauraine Rinne, NP  for:   1. Shortness of breath  - DG Chest 2 View; Future  Walk today in office  Chest x-ray today  We will order pulmonary function testing  2. Pulmonary emphysema, unspecified emphysema type ([place])  - DG Chest 2 View; Future - Pulmonary function test; Future  Bevespi Aerosphere inhaler >>>2 puffs daily twice a day (4 puffs total daily) >>>This is not a rescue inhaler >>>You take this daily no matter what  Only use your albuterol as a rescue medication to be used if you can't catch your breath by resting or doing a relaxed purse lip breathing pattern.  - The less you use it, the better it will work when you need it. - Ok to use up to 2 puffs  every 4 hours if you must but call for immediate appointment if use goes up over your usual need - Don't leave home without it !!  (think of it like the spare tire for your car)   Note your daily symptoms > remember "red flags" for COPD:   >>>Increase in cough >>>increase in sputum production >>>increase in shortness of breath or activity  intolerance.   If you notice these symptoms, please call the office to be seen.   3. History of 2019 novel coronavirus disease (COVID-19)  Chest Xray today   Pulmonary function testing ordered   We recommend today:  Orders Placed This Encounter  Procedures  . DG Chest 2 View    Standing Status:   Future    Number of Occurrences:   1    Standing Expiration Date:   02/09/2021    Order Specific Question:   Reason for Exam (SYMPTOM  OR DIAGNOSIS REQUIRED)    Answer:   doe, hx of copd / rheumatoid arth    Order Specific Question:   Preferred imaging location?    Answer:   Internal    Order Specific Question:   Radiology Contrast Protocol - do NOT remove file path    Answer:   [email or web address]  . Pulmonary function test    Standing Status:   Future    Standing Expiration Date:   10/09/2021    Order  Specific Question:   Where should this test be performed?    Answer:   [facility]    Order Specific Question:   Full PFT: includes the following: basic spirometry, spirometry pre & post bronchodilator, diffusion capacity (DLCO), lung volumes    Answer:   Full PFT   Orders Placed This Encounter  Procedures  . DG Chest 2 View  . Pulmonary function test   No orders of the defined types were placed in this encounter.   Follow Up:    Return in about 6 weeks (around 11/20/2020), or if symptoms worsen or fail to improve, for Follow up with Dr. Purnell Shoemaker, Follow up for FULL PFT - 60 min. If nothing available in this timeframe with Dr. Chase Caller please go ahead and schedule the pulmonary function testing.  And schedule next available with Dr. Chase Caller after pulmonary function test is complete.   Notification of test results are managed in the following manner: If there are  any recommendations or changes to the  plan of care discussed in office today,  we will contact you and let you know what they are. If you do not hear from [place], then your results are normal and you can view them through your  MyChart account , or  a letter will be sent to you. Thank you again for trusting [place] with your care  - Thank you, [facility]    It is flu season:   >>> Best ways to protect herself from the flu: Receive the yearly flu vaccine, practice good hand hygiene washing with soap and also using hand sanitizer when available, eat a nutritious meals, get adequate rest, hydrate appropriately       Please contact the office if your symptoms worsen or you have concerns that you are not improving.   Thank you for choosing [facility] for your healthcare, and for allowing [place] to partner with you on your healthcare journey. I am thankful to be able to provide care to you today.   Wyn Quaker FNP-C

## 2020-10-10 ENCOUNTER — Encounter: Payer: Self-pay | Admitting: Family Medicine

## 2020-10-10 LAB — CBC WITH DIFFERENTIAL/PLATELET
Basophils Absolute: 0 10*3/uL (ref 0.0–0.2)
Basos: 0 %
EOS (ABSOLUTE): 0 10*3/uL (ref 0.0–0.4)
Eos: 1 %
Hematocrit: 38.5 % (ref 37.5–51.0)
Hemoglobin: 13 g/dL (ref 13.0–17.7)
Immature Grans (Abs): 0 10*3/uL (ref 0.0–0.1)
Immature Granulocytes: 0 %
Lymphocytes Absolute: 0.8 10*3/uL (ref 0.7–3.1)
Lymphs: 14 %
MCH: 29.2 pg (ref 26.6–33.0)
MCHC: 33.8 g/dL (ref 31.5–35.7)
MCV: 87 fL (ref 79–97)
Monocytes Absolute: 0.7 10*3/uL (ref 0.1–0.9)
Monocytes: 12 %
Neutrophils Absolute: 4.1 10*3/uL (ref 1.4–7.0)
Neutrophils: 73 %
Platelets: 345 10*3/uL (ref 150–450)
RBC: 4.45 x10E6/uL (ref 4.14–5.80)
RDW: 14.6 % (ref 11.6–15.4)
WBC: 5.7 10*3/uL (ref 3.4–10.8)

## 2020-10-10 LAB — COMPREHENSIVE METABOLIC PANEL
ALT: 26 IU/L (ref 0–44)
AST: 21 IU/L (ref 0–40)
Albumin/Globulin Ratio: 1.4 (ref 1.2–2.2)
Albumin: 4.3 g/dL (ref 3.7–4.7)
Alkaline Phosphatase: 57 IU/L (ref 44–121)
BUN/Creatinine Ratio: 13 (ref 10–24)
BUN: 18 mg/dL (ref 8–27)
Bilirubin Total: 0.7 mg/dL (ref 0.0–1.2)
CO2: 24 mmol/L (ref 20–29)
Calcium: 9.8 mg/dL (ref 8.6–10.2)
Chloride: 101 mmol/L (ref 96–106)
Creatinine, Ser: 1.38 mg/dL — ABNORMAL HIGH (ref 0.76–1.27)
GFR calc Af Amer: 56 mL/min/{1.73_m2} — ABNORMAL LOW (ref >59)
GFR calc non Af Amer: 49 mL/min/{1.73_m2} — ABNORMAL LOW (ref >59)
Globulin, Total: 3 g/dL (ref 1.5–4.5)
Glucose: 106 mg/dL — ABNORMAL HIGH (ref 65–99)
Potassium: 4.9 mmol/L (ref 3.5–5.2)
Sodium: 138 mmol/L (ref 134–144)
Total Protein: 7.3 g/dL (ref 6.0–8.5)

## 2020-10-10 LAB — HEMOGLOBIN A1C
Est. average glucose Bld gHb Est-mCnc: 128 mg/dL
Hgb A1c MFr Bld: 6.1 % — ABNORMAL HIGH (ref 4.8–5.6)

## 2020-10-23 ENCOUNTER — Other Ambulatory Visit: Payer: Self-pay | Admitting: Physician Assistant

## 2020-10-23 NOTE — Telephone Encounter (Signed)
Last Visit: 09/26/2020 Next Visit: 02/26/2021 Labs: 10/09/2020 Creat. 1.38, GFR 56, Glucose 106,   Current Dose per office note on 09/26/2020: Rinvoq 15 mg 1 tablet by mouth daily Dx: Rheumatoid arthritis of multiple sites with negative rheumatoid factor   Okay to refill Rinvoq?

## 2020-10-23 NOTE — Telephone Encounter (Signed)
TB gold negative on 09/01/20.

## 2020-11-20 ENCOUNTER — Ambulatory Visit (INDEPENDENT_AMBULATORY_CARE_PROVIDER_SITE_OTHER): Payer: Medicare HMO | Admitting: Pulmonary Disease

## 2020-11-20 ENCOUNTER — Encounter: Payer: Self-pay | Admitting: Pulmonary Disease

## 2020-11-20 ENCOUNTER — Ambulatory Visit (INDEPENDENT_AMBULATORY_CARE_PROVIDER_SITE_OTHER): Payer: Medicare HMO | Admitting: Internal Medicine

## 2020-11-20 ENCOUNTER — Other Ambulatory Visit: Payer: Self-pay

## 2020-11-20 VITALS — BP 116/60 | HR 90 | Temp 97.1°F | Ht 62.0 in | Wt 148.4 lb

## 2020-11-20 DIAGNOSIS — M069 Rheumatoid arthritis, unspecified: Secondary | ICD-10-CM

## 2020-11-20 DIAGNOSIS — R0602 Shortness of breath: Secondary | ICD-10-CM | POA: Diagnosis not present

## 2020-11-20 DIAGNOSIS — Z8616 Personal history of COVID-19: Secondary | ICD-10-CM | POA: Diagnosis not present

## 2020-11-20 DIAGNOSIS — J439 Emphysema, unspecified: Secondary | ICD-10-CM | POA: Diagnosis not present

## 2020-11-20 LAB — PULMONARY FUNCTION TEST
DL/VA % pred: 99 %
DL/VA: 4.04 ml/min/mmHg/L
DLCO cor % pred: 63 %
DLCO cor: 12.43 ml/min/mmHg
DLCO unc % pred: 60 %
DLCO unc: 11.83 ml/min/mmHg
FEF 25-75 Pre: 1.57 L/sec
FEF2575-%Pred-Pre: 103 %
FEV1-%Pred-Pre: 80 %
FEV1-Pre: 1.55 L
FEV1FVC-%Pred-Pre: 108 %
FEV6-%Pred-Pre: 76 %
FEV6-Pre: 1.91 L
FEV6FVC-%Pred-Pre: 107 %
FVC-%Pred-Pre: 71 %
FVC-Pre: 1.91 L
Pre FEV1/FVC ratio: 81 %
Pre FEV6/FVC Ratio: 100 %

## 2020-11-20 NOTE — Assessment & Plan Note (Signed)
Reviewed pulmonary function testing today  Plan: We will order high-resolution CT chest

## 2020-11-20 NOTE — Assessment & Plan Note (Signed)
Progressive worsening dyspnea This is likely multifactorial -known emphysema, rheumatoid arthritis, physical deconditioning, history of COVID-19 infection Reviewed pulmonary function testing which is stable in comparison to 2017 Have concerns the patient may have new onset RA ILD with left lower lobe crackles on exam today  Plan: High-resolution CT chest ordered Encourage patient to work on increasing overall physical mobility

## 2020-11-20 NOTE — Assessment & Plan Note (Signed)
Stable walk in office in October/2021 Reviewed pulmonary function testing  plan: Continue Bevespi We will order high-resolution CT chest

## 2020-11-20 NOTE — Progress Notes (Signed)
Spirometry and Dlco done today. 

## 2020-11-20 NOTE — Progress Notes (Signed)
@Patient  ID: Tony Mcmillan, male    DOB: 04/29/1941, 79 y.o.   MRN: [id number]  Chief Complaint  Patient presents with  . Follow-up    PFT results, dry cough for 3 weeks, mostly at night, sometimes during the day.    Referring provider: Girtha Rm, NP-C  HPI:  79 year old male former smoker followed in our office for COPD and history of COVID-19 infection  PMH: Degenerative joint disease, vitamin D deficiency, rheumatoid arthritis, history of COVID-19 infection, palpitations Smoker/ Smoking History: Former smoker.  Quit 1995.  17-pack-year smoking history Maintenance: Bevespi Pt of: Dr. Chase Caller  11/20/2020  - Visit   79 year old male former smoker followed in our office for shortness of breath.  Patient completing follow-up with our office after last being seen on 10/09/2020.  Plan of care from that office visit was: Walk in office, chest x-ray, order pulmonary function testing, continue follow-up with rheumatology.  Patient completing pulmonary function testing today.  Those results are listed below:  11/20/2020-pulmonary function test-FVC 1.91 (71% predicted), ratio 81, FEV1 1.55 (80% predicted), DLCO 11.83 (60% predicted)  Patient reports has been doing well since last being seen.  He still does have shortness of breath.  He was previously managed on methotrexate.  This was changed at his last rheumatology visit.  He is on Rinvoq.  He will follow up with him in another couple of months.   Questionaires / Pulmonary Flowsheets:   ACT:  No flowsheet data found.  MMRC: mMRC Dyspnea Scale mMRC Score  10/09/2020 2    Epworth:  No flowsheet data found.  Tests:   11/26/2016-pulmonary function test-FVC 1.79 (65% predicted), postbronchodilator ratio 82, postbronchodilator FEV1 1.81 (65% predicted), no bronchodilator response, DLCO 12.16 (53% predicted)  12/03/2016-CT chest high-res-no findings to suggest interstitial lung disease, mild diffuse bronchial wall thickening  with mild centrilobular emphysema, imaging findings suggestive of underlying COPD, focal area of scarring in the posterior aspect of the right upper lobe likely from prior infection, aortic arthrosclerosis, mild calcifications of aortic valve  03/22/2019-cardiac telemetry-normal sinus rhythm, occasional PACs  10/14/2016-echocardiogram-LV ejection fraction 55 to 40%, grade 1 diastolic dysfunction, right systolic pressure 28   FENO:  No results found for: NITRICOXIDE  PFT: PFT Results Latest Ref Rng & Units 11/20/2020 11/26/2016  FVC-Pre L 1.91 1.79  FVC-Predicted Pre % 71 65  FVC-Post L - 1.81  FVC-Predicted Post % - 65  Pre FEV1/FVC % % 81 81  Post FEV1/FCV % % - 82  FEV1-Pre L 1.55 1.46  FEV1-Predicted Pre % 80 73  FEV1-Post L - 1.48  DLCO uncorrected ml/min/mmHg 11.83 12.16  DLCO UNC% % 60 53  DLCO corrected ml/min/mmHg 12.43 12.31  DLCO COR %Predicted % 63 53  DLVA Predicted % 99 98  TLC L - 3.64  TLC % Predicted % - 64  RV % Predicted % - 86    WALK:  SIX MIN WALK 10/09/2020 04/22/2017 01/16/2017 11/25/2016  Supplimental Oxygen during Test? (L/min) No - - No  2 Minute Oxygen Saturation % - 95 94 -  2 Minute HR - 122 126 -  4 Minute Oxygen Saturation % - 95 95 -  4 Minute HR - 124 118 -  6 Minute Oxygen Saturation % - 94 96 -  6 Minute HR - 109 127 -  Tech Comments: Patient walked slow pace with cane and maintained good sats, expressed shortness of breath after first lap but was able to complete 2 laps total - - Pt ambulated  at a slow pace.     Imaging: No results found.  Lab Results:  CBC    Component Value Date/Time   WBC 5.7 10/09/2020 1133   WBC 5.9 09/01/2020 1449   RBC 4.45 10/09/2020 1133   RBC 4.42 09/01/2020 1449   HGB 13.0 10/09/2020 1133   HCT 38.5 10/09/2020 1133   PLT 345 10/09/2020 1133   MCV 87 10/09/2020 1133   MCH 29.2 10/09/2020 1133   MCH 29.2 09/01/2020 1449   MCHC 33.8 10/09/2020 1133   MCHC 33.2 09/01/2020 1449   RDW 14.6 10/09/2020  1133   LYMPHSABS 0.8 10/09/2020 1133   MONOABS 392 07/14/2017 1123   EOSABS 0.0 10/09/2020 1133   BASOSABS 0.0 10/09/2020 1133    BMET    Component Value Date/Time   NA 138 10/09/2020 1133   K 4.9 10/09/2020 1133   CL 101 10/09/2020 1133   CO2 24 10/09/2020 1133   GLUCOSE 106 (H) 10/09/2020 1133   GLUCOSE 105 (H) 09/01/2020 1449   BUN 18 10/09/2020 1133   CREATININE 1.38 (H) 10/09/2020 1133   CREATININE 1.63 (H) 09/01/2020 1449   CALCIUM 9.8 10/09/2020 1133   GFRNONAA 49 (L) 10/09/2020 1133   GFRNONAA 40 (L) 09/01/2020 1449   GFRAA 56 (L) 10/09/2020 1133   GFRAA 46 (L) 09/01/2020 1449    BNP No results found for: BNP  ProBNP No results found for: PROBNP  Specialty Problems      Pulmonary Problems   Shortness of breath   Pulmonary emphysema (HCC)   Cough      No Known Allergies  Immunization History  Administered Date(s) Administered  . Influenza, High Dose Seasonal PF 07/23/2017, 08/05/2019, 08/30/2020  . Influenza,inj,Quad PF,6+ Mos 08/01/2017  . Influenza-Unspecified 10/30/2015, 09/13/2016, 07/30/2018, 08/05/2019  . PFIZER SARS-COV-2 Vaccination 01/28/2020, 02/18/2020, 09/19/2020  . Pneumococcal Conjugate-13 12/05/2015  . Pneumococcal Polysaccharide-23 04/23/2017  . Tdap 12/09/2015  . Zoster 12/09/2015    Past Medical History:  Diagnosis Date  . 3-vessel coronary artery disease 03/10/2020  . Anemia   . Aortic valve calcification 03/10/2020  . Arthritis   . Asymptomatic microscopic hematuria 04/03/2016   Cystoscopy 02/207 Dr. Pilar Jarvis [facility].    Marland Kitchen Atypical chest pain    a. 09/2016 MV: EF 63%, normal perfusion.  . Benign prostatic hyperplasia 04/03/2016   Asymptomatic. Normal PSA and exam by Dr. Pilar Jarvis [facility] 01/2016.    . Bilateral carotid bruits 02/09/2019  . Bladder wall thickening 03/10/2020  . CAD (coronary artery disease)    Coronary artery calcification on Chest CT in 09/2016 and 01/2019 // Myoview 10/17: normal perfusion //  Myoview 01/2019: no ischemia or infarction, EF 58 // Coronary CTA 03/2020: Calcium Score 1067; non-obstructive CAD [oLM 1-24, LAD ost 25049, mid and dist 1-24; D1 1-24; LCx prox and mid 1-24, OM1 and OM2 25-49, RCA and PLB 1-24; PLA 1-24]   . Carotid bruit    Carotid Dopplers 01/2019: no ICA stenosis bilaterally   . Colonic polyp 02/23/2016  . COVID-19   . DDD (degenerative disc disease), 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 L4-5 and L5-S1   . Diastolic dysfunction    a. 2015 Echo: EF >55%;  b. 09/2016 Echo: EF 55-60%, no rwma, Gr1 DD, triv AI/MR, mild TR, PASP 65mmHg.  Marland Kitchen 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  . Duodenal ulcer 02/23/2016  . Emphysema of lung ([place]) 02/08/2016  . Enlarged prostate 03/10/2020  . GERD (gastroesophageal reflux disease)   . History of  systemic steroid therapy 10/30/2017  . Hx of Blood Transfusion   . Intermittent palpitations 01/29/2019  . 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 Palpitations    Echo 10/17: Mild focal basal septal hypertrophy, EF 55-60, no RWMA, Gr 1 DD, trivial MR, normal RVSF, mild TR, PASP 28 // Event monitor 01/2019: NSR, PACs  . Prediabetes 10/29/2018  . Pulmonary emphysema ([place]) 12/18/2016  . Renal cyst, left 04/03/2016   On CT, benign   . Rheumatoid arthritis ([place])   . 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[place]) 11/04/2016   -RF,-CCP,-1433eta, erosive disease with contractures  . Tubular adenoma of colon 01/2016  . Vitamin D deficiency 11/04/2016    Tobacco History: Social History   Tobacco Use  Smoking Status Former Smoker  . Packs/day: 1.00  . Years: 17.00  . Pack years: 17.00  . Types: Cigarettes  . Quit date: 12/23/1993  . Years since quitting: 26.9  Smokeless Tobacco Never Used   Counseling given: Yes   Continue to not smoke  Outpatient Encounter Medications as of 11/20/2020  Medication Sig  . albuterol (VENTOLIN HFA) 108 (90 Base) MCG/ACT inhaler Inhale 2 puffs into the lungs every 6  (six) hours as needed for wheezing or shortness of breath.  . Ascorbic Acid (VITAMIN C PO) Take by mouth daily.  Marland Kitchen aspirin EC 81 MG tablet Take 1 tablet (81 mg total) by mouth daily.  Marland Kitchen atorvastatin (LIPITOR) 40 MG tablet TAKE 1 TABLET BY MOUTH EVERY DAY  . Calcium Carb-Cholecalciferol (CALCIUM-VITAMIN D3) 600-400 MG-UNIT TABS Take by mouth.  . Ferrous Sulfate (IRON PO) Take by mouth daily.  . Glycopyrrolate-Formoterol (BEVESPI AEROSPHERE) 9-4.8 MCG/ACT AERO Inhale 2 puffs into the lungs 2 (two) times daily.  Marland Kitchen MYRBETRIQ 50 MG TB24 tablet Take 50 mg by mouth daily.   . nitroGLYCERIN (NITROSTAT) 0.4 MG SL tablet Place 1 tablet (0.4 mg total) under the tongue every 5 (five) minutes as needed for chest pain.  . pantoprazole (PROTONIX) 40 MG tablet Take 1 tablet (40 mg total) by mouth daily.  Marland Kitchen RINVOQ 15 MG TB24 TAKE 1 TABLET DAILY  . [DISCONTINUED] metoprolol tartrate (LOPRESSOR) 50 MG tablet Take 50 mg by mouth daily. (Patient not taking: Reported on 11/20/2020)   No facility-administered encounter medications on file as of 11/20/2020.     Review of Systems  Review of Systems  Constitutional: Negative for activity change, chills, fatigue, fever and unexpected weight change.  HENT: Negative for postnasal drip, rhinorrhea, sinus pressure, sinus pain and sore throat.   Eyes: Negative.   Respiratory: Positive for cough (dry cough ) and chest tightness. Negative for shortness of breath and wheezing.   Cardiovascular: Negative for chest pain and palpitations.  Gastrointestinal: Negative for constipation, diarrhea, nausea and vomiting.  Endocrine: Negative.   Genitourinary: Negative.   Musculoskeletal: Negative.   Skin: Negative.   Neurological: Negative for dizziness and headaches.  Psychiatric/Behavioral: Negative.  Negative for dysphoric mood. The patient is not nervous/anxious.   All other systems reviewed and are negative.    Physical Exam  BP 116/60 (BP Location: Left Arm, Patient  Position: Sitting, Cuff Size: Normal)   Pulse 90   Temp (!) 97.1 F (36.2 C) (Temporal)   Ht 5\' 2"  (1.575 m)   Wt 148 lb 6.4 oz (67.3 kg)   SpO2 94%   BMI 27.14 kg/m   Wt Readings from Last 5 Encounters:  11/20/20 148 lb 6.4 oz (67.3 kg)  10/09/20 151 lb 12.8  oz (68.9 kg)  10/09/20 152 lb (68.9 kg)  09/26/20 151 lb 3.2 oz (68.6 kg)  09/06/20 150 lb (68 kg)    BMI Readings from Last 5 Encounters:  11/20/20 27.14 kg/m  10/09/20 27.76 kg/m  10/09/20 27.80 kg/m  09/26/20 27.65 kg/m  09/06/20 26.57 kg/m     Physical Exam Vitals and nursing note reviewed.  Constitutional:      General: He is not in acute distress.    Appearance: Normal appearance. He is normal weight.  HENT:     Head: Normocephalic and atraumatic.     Right Ear: Hearing and external ear normal.     Left Ear: Hearing and external ear normal.     Nose: Nose normal. No mucosal edema or rhinorrhea.     Right Turbinates: Not enlarged.     Left Turbinates: Not enlarged.     Mouth/Throat:     Mouth: Mucous membranes are dry.     Pharynx: Oropharynx is clear. No oropharyngeal exudate.  Eyes:     Pupils: Pupils are equal, round, and reactive to light.  Cardiovascular:     Rate and Rhythm: Normal rate and regular rhythm.     Pulses: Normal pulses.     Heart sounds: Normal heart sounds. No murmur heard.   Pulmonary:     Effort: Pulmonary effort is normal.     Breath sounds: Rales (LL crackles ) present. No decreased breath sounds or wheezing.  Musculoskeletal:     Cervical back: Normal range of motion.     Right lower leg: No edema.     Left lower leg: No edema.  Lymphadenopathy:     Cervical: No cervical adenopathy.  Skin:    General: Skin is warm and dry.     Capillary Refill: Capillary refill takes less than 2 seconds.     Findings: No erythema or rash.  Neurological:     General: No focal deficit present.     Mental Status: He is alert and oriented to person, place, and time.     Motor: No  weakness.     Coordination: Coordination normal.     Gait: Gait is intact. Gait normal.  Psychiatric:        Mood and Affect: Mood normal.        Behavior: Behavior normal. Behavior is cooperative.        Thought Content: Thought content normal.        Judgment: Judgment normal.       Assessment & Plan:   Pulmonary emphysema (HCC)   Stable walk in office in October/2021 Reviewed pulmonary function testing  plan: Continue Bevespi We will order high-resolution CT chest   Rheumatoid arthritis involving multiple sites [facility]) Left lower lobe crackles on exam today Progressive/worsening dyspnea over the last 6 to 12 months Followed by rheumatology, maintained previously on methotrexate and now Rinvoq HRCT in 2017 did not show evidence of ILD Still having ongoing shortness of breath Physical deconditioning  Plan: 6 to 8-week follow-up with Dr. Chase Caller We will order high-resolution CT chest Continue follow-up with rheumatology Continue medications as managed by rheumatology  History of 2019 novel coronavirus disease (COVID-19) Reviewed pulmonary function testing today  Plan: We will order high-resolution CT chest  Shortness of breath Progressive worsening dyspnea This is likely multifactorial -known emphysema, rheumatoid arthritis, physical deconditioning, history of COVID-19 infection Reviewed pulmonary function testing which is stable in comparison to 2017 Have concerns the patient may have new onset RA ILD with left  lower lobe crackles on exam today  Plan: High-resolution CT chest ordered Encourage patient to work on increasing overall physical mobility     Return in about 6 weeks (around 01/01/2021), or if symptoms worsen or fail to improve, for Follow up with Dr. Purnell Shoemaker.   Lauraine Rinne, NP 11/20/2020   This appointment required 32 minutes of patient care (this includes precharting, chart review, review of results, face-to-face care, etc.).

## 2020-11-20 NOTE — Patient Instructions (Addendum)
You were seen today by Lauraine Rinne, NP  for:   1. Shortness of breath  We will order a high-resolution CT chest  2. Pulmonary emphysema, unspecified emphysema type (HCC)  Bevespi Aerosphere inhaler >>>2 puffs daily twice a day (4 puffs total daily) >>>This is not a rescue inhaler >>>You take this daily no matter what  Note your daily symptoms > remember "red flags" for COPD:   >>>Increase in cough >>>increase in sputum production >>>increase in shortness of breath or activity  intolerance.   If you notice these symptoms, please call the office to be seen.   3. History of 2019 novel coronavirus disease (COVID-19)  We will continue to monitor clinically  4. Rheumatoid arthritis involving multiple sites, unspecified whether rheumatoid factor present ([place])  Continue medications as managed by rheumatology  Follow Up:    Return in about 6 weeks (around 01/01/2021), or if symptoms worsen or fail to improve, for Follow up with Dr. Purnell Shoemaker.   Notification of test results are managed in the following manner: If there are  any recommendations or changes to the  plan of care discussed in office today,  we will contact you and let you know what they are. If you do not hear from [place], then your results are normal and you can view them through your  MyChart account , or a letter will be sent to you. Thank you again for trusting [place] with your care  - Thank you, [facility]    It is flu season:   >>> Best ways to protect herself from the flu: Receive the yearly flu vaccine, practice good hand hygiene washing with soap and also using hand sanitizer when available, eat a nutritious meals, get adequate rest, hydrate appropriately       Please contact the office if your symptoms worsen or you have concerns that you are not improving.   Thank you for choosing [facility] for your healthcare, and for allowing [place] to partner with you on your healthcare journey. I am thankful  to be able to provide care to you today.   Wyn Quaker FNP-C

## 2020-11-20 NOTE — Assessment & Plan Note (Signed)
Left lower lobe crackles on exam today Progressive/worsening dyspnea over the last 6 to 12 months Followed by rheumatology, maintained previously on methotrexate and now Rinvoq HRCT in 2017 did not show evidence of ILD Still having ongoing shortness of breath Physical deconditioning  Plan: 6 to 8-week follow-up with Dr. Chase Caller We will order high-resolution CT chest Continue follow-up with rheumatology Continue medications as managed by rheumatology

## 2020-12-27 ENCOUNTER — Other Ambulatory Visit: Payer: Self-pay | Admitting: *Deleted

## 2020-12-27 DIAGNOSIS — Z79899 Other long term (current) drug therapy: Secondary | ICD-10-CM

## 2020-12-28 LAB — COMPLETE METABOLIC PANEL WITH GFR
AG Ratio: 1.4 (calc) (ref 1.0–2.5)
ALT: 23 U/L (ref 9–46)
AST: 28 U/L (ref 10–35)
Albumin: 4.2 g/dL (ref 3.6–5.1)
Alkaline phosphatase (APISO): 53 U/L (ref 35–144)
BUN/Creatinine Ratio: 10 (calc) (ref 6–22)
BUN: 15 mg/dL (ref 7–25)
CO2: 25 mmol/L (ref 20–32)
Calcium: 9.5 mg/dL (ref 8.6–10.3)
Chloride: 101 mmol/L (ref 98–110)
Creat: 1.43 mg/dL — ABNORMAL HIGH (ref 0.70–1.18)
GFR, Est African American: 54 mL/min/{1.73_m2} — ABNORMAL LOW (ref >=60)
GFR, Est Non African American: 47 mL/min/{1.73_m2} — ABNORMAL LOW (ref >=60)
Globulin: 2.9 g/dL (calc) (ref 1.9–3.7)
Glucose, Bld: 90 mg/dL (ref 65–99)
Potassium: 5.1 mmol/L (ref 3.5–5.3)
Sodium: 135 mmol/L (ref 135–146)
Total Bilirubin: 1 mg/dL (ref 0.2–1.2)
Total Protein: 7.1 g/dL (ref 6.1–8.1)

## 2020-12-28 LAB — CBC WITH DIFFERENTIAL/PLATELET
Absolute Monocytes: 540 cells/uL (ref 200–950)
Basophils Absolute: 11 cells/uL (ref 0–200)
Basophils Relative: 0.2 %
Eosinophils Absolute: 22 cells/uL (ref 15–500)
Eosinophils Relative: 0.4 %
HCT: 39.7 % (ref 38.5–50.0)
Hemoglobin: 13.6 g/dL (ref 13.2–17.1)
Lymphs Abs: 518 cells/uL — ABNORMAL LOW (ref 850–3900)
MCH: 29.8 pg (ref 27.0–33.0)
MCHC: 34.3 g/dL (ref 32.0–36.0)
MCV: 87.1 fL (ref 80.0–100.0)
MPV: 9.7 fL (ref 7.5–12.5)
Monocytes Relative: 10 %
Neutro Abs: 4309 cells/uL (ref 1500–7800)
Neutrophils Relative %: 79.8 %
Platelets: 309 10*3/uL (ref 140–400)
RBC: 4.56 10*6/uL (ref 4.20–5.80)
RDW: 14.9 % (ref 11.0–15.0)
Total Lymphocyte: 9.6 %
WBC: 5.4 10*3/uL (ref 3.8–10.8)

## 2020-12-28 NOTE — Progress Notes (Signed)
GFR is low and stable.  Lymphocyte count is low.  We will continue to monitor.  No change in therapy advised.

## 2021-01-01 ENCOUNTER — Other Ambulatory Visit: Payer: Self-pay

## 2021-01-01 ENCOUNTER — Ambulatory Visit (INDEPENDENT_AMBULATORY_CARE_PROVIDER_SITE_OTHER)
Admission: RE | Admit: 2021-01-01 | Discharge: 2021-01-01 | Disposition: A | Discharge status: Home / Self Care | Payer: Medicare HMO | Source: Ambulatory Visit | Attending: Pulmonary Disease | Admitting: Pulmonary Disease

## 2021-01-01 DIAGNOSIS — R0602 Shortness of breath: Secondary | ICD-10-CM

## 2021-01-11 ENCOUNTER — Ambulatory Visit (INDEPENDENT_AMBULATORY_CARE_PROVIDER_SITE_OTHER): Payer: Medicare HMO | Admitting: Internal Medicine

## 2021-01-11 ENCOUNTER — Encounter: Payer: Self-pay | Admitting: Internal Medicine

## 2021-01-11 ENCOUNTER — Other Ambulatory Visit: Payer: Self-pay

## 2021-01-11 VITALS — BP 114/78 | HR 78 | Ht 62.0 in | Wt 153.6 lb

## 2021-01-11 DIAGNOSIS — J439 Emphysema, unspecified: Secondary | ICD-10-CM | POA: Diagnosis not present

## 2021-01-11 MED ORDER — BREZTRI AEROSPHERE 160-9-4.8 MCG/ACT IN AERO: 2.0000 | INHALATION_SPRAY | Freq: Two times a day (BID) | RESPIRATORY_TRACT | 0 refills | Status: DC

## 2021-01-11 NOTE — Patient Instructions (Addendum)
    ICD-10-CM   1. Pulmonary emphysema, unspecified emphysema type ([place])  J43.9      Clinically stable with some amount of symptoms  Plan -   - try sample breztril 2 puff twice daily to see if we can improve symptoms further   -  If this inhaler works better for you then please call us back in a couple of weeks and we will send in a prescription . Otherwise go back to BEVESPI  -Use albuterol as needed  Follow-up - Return in 6 months to see nurse practitioner; CAT score r

## 2021-01-11 NOTE — Progress Notes (Signed)
OV 02/21/2017  Chief Complaint  Patient presents with  . Follow-up    Pt states his breathing is unchanged. Pt c/o right sided chest tightness that is persistent  - since 11/2016. Pt c/o prod cough with clear mucus.     80 year old African-American male with RA on methotrexate. Has dyspnea on exertion. Workup over the last few months as showing emphysema with isolated reduction in diffusion capacity. He's been started on Spiriva and nocturnal oxygen. He returns for follow-up after institution of these therapies. He tells me that he's not sure if these therapies have helped. He thinks it might have helped. However he is reporting new onset in the last month or 2 but definitely postdating starting of the Spiriva therapy is constant chest tightness on the right breast area. There is no clear cut aggravating factor or relieving factor. It is not a pain but it as a chest tightness. This no radiation. It is mild to moderate but definitely annoying. This no fever or chills or wheezing is definitely seeking relief for this.     03/24/2017 Follow Up Appointment: Pt. Presents today for follow up. He was seen by Dr. Chase Caller 02/25/2017. He had been switched from Spiriva to [facility] for continued chest tightness.  Pt. States he does not really notice much of a change. He still is experiencing  some very mild chest tightness every now and again.There is no patten to the chest tightness.He states it is not bad chest tightness and that it self resolves.He was seen by cardiology 01/2017 normal ECG, and previously normal ischemic evaluation. They signed off for follow up as needed. He is agreeable to continuing on the [place] as he has notes a slight improvement. We did not start a rescue inhaler as there was no BD response on PFT. He denies any chest pain, fever, orthopnea or hemoptysis. Minimal secretions with cough.He has added calcium and Vitamin D3 as treatment to his medication regimen while on methotrexate. He  states he is complaint with his nocturnal oxygen.Saturations today are 94% on RA today.He is compliant with his Pulmonary rehab, and feels he is benefiting from treatment.  Tests 2-D echo October 2017 showed grade 1 diastolic dysfunction. Cardiac stress test negative in October 2017 Walk test in the office December 2017 did not show any type of desaturation. Chest x-ray October 2017 showed clear lungs High resolution CT chest 12/03/2016 showed no ILD, mild emphysema, mild diffuse bronchial wall thickening, focal area of scarring in the right upper lobe ono 11/29/16 shows pulse ox </= 88% at 46 min. Start 1L [facility]  Pulmonary function test done 11/26/2016 showed an FEV1 at 74%, ratio 82, FVC 65%, no bronchodilator response, ERV 138%, DLCO 53%  OV 07/01/2017  Chief Complaint  Patient presents with  . Follow-up    Pt states he has right sided chest tightness that is constant. Pt denies significant cough, f/c/s, and SOB.    Follow-up rheumatoid arthritis patient with emphysema and associated right-sided chest tightness  This is a routine follow-up. Last summer nurse partition April 2018. He is on dual inhaler therapy scheduled long-acting beta agonist and anticholinergic may by [employer]. This is working well for him. He is stable in terms of dyspnea. In fact is no dyspnea. He is attending pulmonary rehabilitation and has no problems. His main issues that he still continues to have moderate amount of right-sided chest tightness between the right breast and the right sternal area. The circular area around 5 or 7 cm in  size. The constant feeling of tightness. Is moderate in severity. Present for several months without any progression. No radiation. No diaphoresis. He is frustrated by it. The inhalers are not helping.   OV 10/28/2017 Chief Complaint  Patient presents with  . Follow-up    Pt here due to [place] stating pt needing to be re-evaluated. States that he wears 1L O2 at night. States that he  has occ. coughing with occ. chest tightness.    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 He wants to be followed For his nocturnal oxygen. He is stable in terms of dyspnea. He is on Bevespi which he is working well for him. He is up-to-date with his flu shot. He does have some random episodes of cough particularly when the weather [place] mother does want it is mild and manageable but he does want to add on inhaler for this. Walking desaturation to as 185 feet 3 laps on room air: Resting pulse ox 100%. Final pulse ox 96%. Resting heart rate 87/m. Final heart rate 114/m.  Unexplained right-sided chest tightness: This persists without change  OV 05/04/2018  Chief Complaint  Patient presents with  . Follow-up    Pt states he has been doing good since last visit.  Pt still has some pain midsternal, some mild SOB, and a mild cough.   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  Presents for routine follow-up.  Since I last saw him his methotrexate has been reduced because of a rising creatinine.  Morrie Sheldon has been added.  This is in April 2019.  I personally reviewed the chart by Dr. D and confirmed the findings.  I also visualized the results.  In terms of his emphysema and dyspnea this is stable.  He continues to be on inhaler Bevespi 2 puffs 2 times daily and is compliant with it.  There are no new issues.  He has upcoming travel for family vacation for a few to several days.  He wants to know if he can discontinue his nocturnal oxygen.  Overall and the time that he has spent since he started the nocturnal oxygen in terms of his dyspnea he is better.  He has never desaturated with exertion in the past.  There is no cough.  His unexplained right-sided chest tightness  which is constant versus intermittent without any reproducible tenderness is still ongoing.    11/20/2020  - Visit   80 year old male former smoker followed in our office for shortness of breath.  Patient completing follow-up with our office after last being seen on 10/09/2020.  Plan of care from that office visit was: Walk in office, chest x-ray, order pulmonary function testing, continue follow-up with rheumatology.  Patient completing pulmonary function testing today.  Those results are listed below:  11/20/2020-pulmonary function test-FVC 1.91 (71% predicted), ratio 81, FEV1 1.55 (80% predicted), DLCO 11.83 (60% predicted)  Patient reports has been doing well since last being seen.  He still does have shortness of breath.  He was previously managed on methotrexate.  This was changed at his last rheumatology visit.  He is on Rinvoq.  He will follow up with him in another couple of months.   Questionaires / Pulmonary Flowsheets:   ACT:  No flowsheet data found.  MMRC: mMRC Dyspnea Scale mMRC Score  10/09/2020 2    Epworth:  No flowsheet data found.  Tests:   11/26/2016-pulmonary function test-FVC 1.79 (65% predicted), postbronchodilator ratio 82, postbronchodilator FEV1 1.81 (65% predicted), no bronchodilator response, DLCO 12.16 (53% predicted)  12/03/2016-CT chest high-res-no findings to suggest interstitial lung disease, mild diffuse bronchial wall thickening with mild centrilobular emphysema, imaging findings suggestive of underlying COPD, focal area of scarring in the posterior aspect of the right upper lobe likely from prior infection, aortic arthrosclerosis, mild calcifications of aortic valve  03/22/2019-cardiac telemetry-normal sinus rhythm, occasional PACs  10/14/2016-echocardiogram-LV ejection fraction 55 to 14%, grade 1 diastolic dysfunction, right systolic pressure 28  OV 7/82/9562  Subjective:  Patient ID: Tony Mcmillan, male , DOB: 30-Mar-1941 , age 52 y.o. , MRN:  [id number] , ADDRESS: [address] [place] [address] PCP Girtha Rm, NP-C Patient Care Team: Girtha Rm, NP-C as PCP - General (Family Medicine) Josue Hector, MD as PCP - Cardiology (Cardiology)  This Provider for this visit: Treatment Team:  Attending Provider: Brand Males, MD    01/11/2021 -   Chief Complaint  Patient presents with  . Follow-up    Pt states he has been doing okay since last visit and denies any complaints.    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 -returns for follow-up.  I personally last saw him in May 2019.  Then towards the end of 2020 when he saw a nurse practitioner.  He had a CT scan of the chest that shows elevated right hemidiaphragm but no fibrosis.  He has emphysema.  He continues to feel stable.  He still has an unexplained right-sided chest tightness.  He uses a cane.  He drives around.  His COPD CAT score is 9 but he wants to improve his symptoms further.  Filled out DME form at his request.    CAT Score 01/11/2021 11/20/2020 08/03/2019  Total CAT Score 9 11 5      CT Chest data  No results found.   Plan - CT chest IMPRESSION: 1. No evidence of fibrotic interstitial lung disease. 2. Elevation of the right hemidiaphragm with associated scarring and or atelectasis of the right lung base. 3. Emphysema. 4. Coronary artery disease.  Aortic Atherosclerosis (ICD10-I70.0) and Emphysema (ICD10-J43.9).   Electronically Signed   By: Eddie Candle M.D.   On: 01/01/2021 10:24  PFT  PFT Results Latest Ref Rng & Units 11/20/2020 11/26/2016  FVC-Pre L 1.91 1.79  FVC-Predicted Pre % 71 65  FVC-Post L - 1.81  FVC-Predicted Post % - 65  Pre FEV1/FVC % % 81 81  Post FEV1/FCV % % - 82  FEV1-Pre L 1.55 1.46  FEV1-Predicted Pre % 80 73   FEV1-Post L - 1.48  DLCO uncorrected ml/min/mmHg 11.83 12.16  DLCO UNC% % 60 53  DLCO corrected ml/min/mmHg 12.43 12.31  DLCO COR %Predicted % 63 53  DLVA Predicted % 99 98  TLC L - 3.64  TLC % Predicted % - 64  RV % Predicted % - 86       has a past medical history of 3-vessel coronary artery disease (03/10/2020), Anemia, Aortic valve calcification (03/10/2020), Arthritis, Asymptomatic microscopic hematuria (04/03/2016), Atypical chest pain, Benign prostatic hyperplasia (04/03/2016), Bilateral carotid bruits (02/09/2019), Bladder wall thickening (03/10/2020), CAD (coronary artery disease), Carotid bruit, Colonic polyp (02/23/2016), COVID-19, DDD (degenerative disc disease), lumbar (05/20/4131), Diastolic dysfunction, DJD (degenerative joint disease), cervical (11/04/2016), Duodenal  ulcer (02/23/2016), Emphysema of lung ([place]) (02/08/2016), Enlarged prostate (03/10/2020), GERD (gastroesophageal reflux disease), History of systemic steroid therapy (10/30/2017), Hx of Blood Transfusion, Intermittent palpitations (01/29/2019), 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   reports that he quit smoking about 27 years ago. His smoking use included cigarettes. He has a 17.00 pack-year smoking history. He has never used smokeless tobacco.  Past Surgical History:  Procedure Laterality Date  . ANTERIOR CERVICAL CORPECTOMY    . ANTERIOR CERVICAL CORPECTOMY  12/2014   for infection. this was in [place]  . CATARACT EXTRACTION     both eyes  . COLOSTOMY REVERSAL     had infection on buttocks and had to have a skin graft- had colostomy to help area stay clean and heal  . HERNIA REPAIR    . SPINE SURGERY      No Known Allergies  Immunization  History  Administered Date(s) Administered  . Influenza, High Dose Seasonal PF 07/23/2017, 08/05/2019, 08/30/2020  . Influenza,inj,Quad PF,6+ Mos 08/01/2017  . Influenza-Unspecified 10/30/2015, 09/13/2016, 07/30/2018, 08/05/2019  . PFIZER(Purple Top)SARS-COV-2 Vaccination 01/28/2020, 02/18/2020, 09/19/2020  . Pneumococcal Conjugate-13 12/05/2015  . Pneumococcal Polysaccharide-23 04/23/2017  . Tdap 12/09/2015  . Zoster 12/09/2015    Family History  Problem Relation Age of Onset  . Hypertension Mother   . Hypertension Father   . Diabetes Father   . Heart disease Father        stents  . Hyperlipidemia Sister   . Diabetes Sister   . Stroke Brother   . Diabetes Brother   . Heart disease Brother        stents  . Colon polyps Neg Hx   . Esophageal cancer Neg Hx   . Stomach cancer Neg Hx   . Rectal cancer Neg Hx   . Colon cancer Neg Hx      Current Outpatient Medications:  .  albuterol (VENTOLIN HFA) 108 (90 Base) MCG/ACT inhaler, Inhale 2 puffs into the lungs every 6 (six) hours as needed for wheezing or shortness of breath., Disp: 8 g, Rfl: 6 .  Ascorbic Acid (VITAMIN C PO), Take by mouth daily., Disp: , Rfl:  .  aspirin EC 81 MG tablet, Take 1 tablet (81 mg total) by mouth daily., Disp: 90 tablet, Rfl: 3 .  atorvastatin (LIPITOR) 40 MG tablet, TAKE 1 TABLET BY MOUTH EVERY DAY, Disp: 90 tablet, Rfl: 2 .  Budeson-Glycopyrrol-Formoterol (BREZTRI AEROSPHERE) 160-9-4.8 MCG/ACT AERO, Inhale 2 puffs into the lungs in the morning and at bedtime., Disp: 5.9 g, Rfl: 0 .  Calcium Carb-Cholecalciferol (CALCIUM-VITAMIN D3) 600-400 MG-UNIT TABS, Take by mouth., Disp: , Rfl:  .  Ferrous Sulfate (IRON PO), Take by mouth daily., Disp: , Rfl:  .  MYRBETRIQ 50 MG TB24 tablet, Take 50 mg by mouth daily. , Disp: , Rfl:  .  pantoprazole (PROTONIX) 40 MG tablet, Take 1 tablet (40 mg total) by mouth daily., Disp: 90 tablet, Rfl: 3 .  RINVOQ 15 MG TB24, TAKE 1 TABLET DAILY, Disp: 90 tablet, Rfl: 0 .   nitroGLYCERIN (NITROSTAT) 0.4 MG SL tablet, Place 1 tablet (0.4 mg total) under the tongue every 5 (five) minutes as needed for chest pain. (Patient not taking: Reported on 01/11/2021), Disp: 25 tablet, Rfl: 6      Objective:   Vitals:   01/11/21 0931  BP:  114/78  Pulse: 78  SpO2: 96%  Weight: 153 lb 9.6 oz (69.7 kg)  Height: 5\' 2"  (1.575 m)    Estimated body mass index is 28.09 kg/m as calculated from the following:   Height as of this encounter: 5\' 2"  (1.575 m).   Weight as of this encounter: 153 lb 9.6 oz (69.7 kg).  @WEIGHTCHANGE @  [employer]   01/11/21 0931  Weight: 153 lb 9.6 oz (69.7 kg)     Physical Exam   General: No distress. Looks well. Has cane Neuro: Alert and Oriented x 3. GCS 15. Speech normal Psych: Pleasant Resp:  Barrel Chest - no.  Wheeze - no, Crackles - no, No overt respiratory distress CVS: Normal heart sounds. Murmurs - no Ext: Stigmata of Connective Tissue Disease - no HEENT: Normal upper airway. PEERL +. No post nasal drip        Assessment:       ICD-10-CM   1. Pulmonary emphysema, unspecified emphysema type ([place])  J43.9        Plan:     Patient Instructions      ICD-10-CM   1. Pulmonary emphysema, unspecified emphysema type ([place])  J43.9      Clinically stable with some amount of symptoms  Plan -   - try sample breztril 2 puff twice daily to see if we can improve symptoms further   -  If this inhaler works better for you then please call us back in a couple of weeks and we will send in a prescription . Otherwise go back to BEVESPI  -Use albuterol as needed  Follow-up - Return in 6 months to see nurse practitioner; CAT score r     SIGNATURE    Dr. Brand Males, M.D., F.C.C.P,  Pulmonary and Critical Care Medicine Staff Physician, [place] Director - Interstitial Lung Disease  Program  Pulmonary [place] [facility] [place], [place], [address]  Pager:  [phone], If no answer or between  15:00h - 7:00h: call [phone] Telephone: [phone]  10:32 AM 01/11/2021

## 2021-01-19 ENCOUNTER — Other Ambulatory Visit: Payer: Self-pay | Admitting: *Deleted

## 2021-01-19 MED ORDER — RINVOQ 15 MG PO TB24: 1.0000 | ORAL_TABLET | Freq: Every day | ORAL | 0 refills | Status: DC

## 2021-01-19 NOTE — Telephone Encounter (Signed)
RX faxed from [facility]  Last Visit: 09/26/2020 Next Visit: 02/26/2021 Labs: 12/27/2020, GFR is low and stable. Lymphocyte count is low. We will continue to monitor. No change in therapy advised. TB Gold: 09/01/2020, negative  Current Dose per office note 09/26/2020, Rinvoq 15 mg 1 tablet by mouth daily  PX:TGGYIRSWNI arthritis of multiple sites with negative rheumatoid factor   Okay to refill Rinvoq?

## 2021-02-12 NOTE — Progress Notes (Deleted)
Office Visit Note  Patient: Tony Mcmillan             Date of Birth: 1941/04/21           MRN: [id number]             PCP: Girtha Rm, NP-C Referring: Girtha Rm, NP-C Visit Date: 02/26/2021 Occupation: @GUAROCC @  Subjective:  No chief complaint on file.   History of Present Illness: Tony Mcmillan is a 80 y.o. male ***   Activities of Daily Living:  Patient reports morning stiffness for *** {minute/hour:19697}.   Patient {ACTIONS;DENIES/REPORTS:21021675::"Denies"} nocturnal pain.  Difficulty dressing/grooming: {ACTIONS;DENIES/REPORTS:21021675::"Denies"} Difficulty climbing stairs: {ACTIONS;DENIES/REPORTS:21021675::"Denies"} Difficulty getting out of chair: {ACTIONS;DENIES/REPORTS:21021675::"Denies"} Difficulty using hands for taps, buttons, cutlery, and/or writing: {ACTIONS;DENIES/REPORTS:21021675::"Denies"}  No Rheumatology ROS completed.   PMFS History:  Patient Active Problem List   Diagnosis Date Noted  . CAD (coronary artery disease)   . Enlarged prostate 03/10/2020  . Aortic valve calcification 03/10/2020  . 3-vessel coronary artery disease 03/10/2020  . Bladder wall thickening 03/10/2020  . History of 2019 novel coronavirus disease (COVID-19) 12/13/2019  . COVID-19 virus infection 11/15/2019  . Cough 11/15/2019  . Weight loss 11/15/2019  . Bilateral carotid bruits 02/09/2019  . Chest pain 02/08/2019  . Intermittent palpitations 01/29/2019  . Prediabetes 10/29/2018  . History of systemic steroid therapy 10/30/2017  . Feeling of chest tightness, unclear cause, ? due to RA 02/21/2017  . Pulmonary emphysema ([place]) 12/18/2016  . Shortness of breath 11/25/2016  . 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[place]) 11/04/2016  . High risk medication use 11/04/2016  . 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 Vitamin D deficiency 11/04/2016  . 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 History of anemia 07/02/2016  . History of colonic polyps  07/02/2016  . History of gastric ulcer 07/02/2016  . Asymptomatic microscopic hematuria 04/03/2016  . Benign prostatic hyperplasia 04/03/2016  . Renal cyst, left 04/03/2016  . 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 DDD (degenerative disc disease), lumbar 04/03/2016  . Colonic polyp 02/23/2016  . Duodenal ulcer 02/23/2016    Past Medical History:  Diagnosis Date  . 3-vessel coronary artery disease 03/10/2020  . Anemia   . Aortic valve calcification 03/10/2020  . Arthritis   . Asymptomatic microscopic hematuria 04/03/2016   Cystoscopy 02/207 Dr. Pilar Jarvis [facility].    Marland Kitchen Atypical chest pain    a. 09/2016 MV: EF 63%, normal perfusion.  . Benign prostatic hyperplasia 04/03/2016   Asymptomatic. Normal PSA and exam by Dr. Pilar Jarvis [facility] 01/2016.    . Bilateral carotid bruits 02/09/2019  . Bladder wall thickening 03/10/2020  . CAD (coronary artery disease)    Coronary artery calcification on Chest CT in 09/2016 and 01/2019 // Myoview 10/17: normal perfusion // Myoview 01/2019: no ischemia or infarction, EF 58 // Coronary CTA 03/2020: Calcium Score 1067; non-obstructive CAD [oLM 1-24, LAD ost 25049, mid and dist 1-24; D1 1-24; LCx prox and mid 1-24, OM1 and OM2 25-49, RCA and PLB 1-24; PLA 1-24]   . Carotid bruit    Carotid Dopplers 01/2019: no ICA stenosis bilaterally   . Colonic polyp 02/23/2016  . COVID-19   . DDD (degenerative disc disease), 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 L4-5 and L5-S1   . Diastolic dysfunction    a. 2015 Echo: EF >55%;  b. 09/2016 Echo: EF 55-60%, no rwma, Gr1 DD, triv AI/MR, mild TR, PASP 73mmHg.  Marland Kitchen 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  . Duodenal  ulcer 02/23/2016  . Emphysema of lung ([place]) 02/08/2016  . Enlarged prostate 03/10/2020  . GERD (gastroesophageal reflux disease)   . History of systemic steroid therapy 10/30/2017  . Hx of Blood Transfusion   . Intermittent palpitations 01/29/2019  . Osteoarthritis of lumbar spine 04/03/2016   On  CT 01/2016   . Palpitations    Echo 10/17: Mild focal basal septal hypertrophy, EF 55-60, no RWMA, Gr 1 DD, trivial MR, normal RVSF, mild TR, PASP 28 // Event monitor 01/2019: NSR, PACs  . Prediabetes 10/29/2018  . Pulmonary emphysema ([place]) 12/18/2016  . Renal cyst, left 04/03/2016   On CT, benign   . Rheumatoid arthritis ([place])   . 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[place]) 11/04/2016   -RF,-CCP,-1433eta, erosive disease with contractures  . Tubular adenoma of colon 01/2016  . Vitamin D deficiency 11/04/2016    Family History  Problem Relation Age of Onset  . Hypertension Mother   . Hypertension Father   . Diabetes Father   . Heart disease Father        stents  . Hyperlipidemia Sister   . Diabetes Sister   . Stroke Brother   . Diabetes Brother   . Heart disease Brother        stents  . Colon polyps Neg Hx   . Esophageal cancer Neg Hx   . Stomach cancer Neg Hx   . Rectal cancer Neg Hx   . Colon cancer Neg Hx    Past Surgical History:  Procedure Laterality Date  . ANTERIOR CERVICAL CORPECTOMY    . ANTERIOR CERVICAL CORPECTOMY  12/2014   for infection. this was in [place]  . CATARACT EXTRACTION     both eyes  . COLOSTOMY REVERSAL     had infection on buttocks and had to have a skin graft- had colostomy to help area stay clean and heal  . HERNIA REPAIR    . SPINE SURGERY     Social History   Social History Narrative   Retired. Lives alone.    Immunization History  Administered Date(s) Administered  . Influenza, High Dose Seasonal PF 07/23/2017, 08/05/2019, 08/30/2020  . Influenza,inj,Quad PF,6+ Mos 08/01/2017  . Influenza-Unspecified 10/30/2015, 09/13/2016, 07/30/2018, 08/05/2019  . PFIZER(Purple Top)SARS-COV-2 Vaccination 01/28/2020, 02/18/2020, 09/19/2020  . Pneumococcal Conjugate-13 12/05/2015  . Pneumococcal Polysaccharide-23 04/23/2017  . Tdap 12/09/2015  . Zoster 12/09/2015     Objective: Vital Signs: There were no vitals taken for this visit.    Physical Exam   Musculoskeletal Exam: ***  CDAI Exam: CDAI Score: -- Patient Global: --; Provider Global: -- Swollen: --; Tender: -- Joint Exam 02/26/2021   No joint exam has been documented for this visit   There is currently no information documented on the homunculus. Go to the Rheumatology activity and complete the homunculus joint exam.  Investigation: No additional findings.  Imaging: No results found.  Recent Labs: Lab Results  Component Value Date   WBC 5.4 12/27/2020   HGB 13.6 12/27/2020   PLT 309 12/27/2020   NA 135 12/27/2020   K 5.1 12/27/2020   CL 101 12/27/2020   CO2 25 12/27/2020   GLUCOSE 90 12/27/2020   BUN 15 12/27/2020   CREATININE 1.43 (H) 12/27/2020   BILITOT 1.0 12/27/2020   ALKPHOS 57 10/09/2020   AST 28 12/27/2020   ALT 23 12/27/2020   PROT 7.1 12/27/2020   ALBUMIN 4.3 10/09/2020   CALCIUM 9.5 12/27/2020   GFRAA 54 (L) 12/27/2020   QFTBGOLDPLUS NEGATIVE 09/01/2020  Speciality Comments: No specialty comments available.  Procedures:  No procedures performed Allergies: Patient has no known allergies.   Assessment / Plan:     Visit Diagnoses: No diagnosis found.  Orders: No orders of the defined types were placed in this encounter.  No orders of the defined types were placed in this encounter.   Face-to-face time spent with patient was *** minutes. Greater than 50% of time was spent in counseling and coordination of care.  Follow-Up Instructions: No follow-ups on file.   Earnestine Mealing, CMA  Note - This record has been created using Editor, commissioning.  Chart creation errors have been sought, but may not always  have been located. Such creation errors do not reflect on  the standard of medical care.

## 2021-02-13 ENCOUNTER — Other Ambulatory Visit: Payer: Self-pay

## 2021-02-13 ENCOUNTER — Ambulatory Visit (INDEPENDENT_AMBULATORY_CARE_PROVIDER_SITE_OTHER): Payer: Medicare HMO | Admitting: Family Medicine

## 2021-02-13 ENCOUNTER — Encounter: Payer: Self-pay | Admitting: Family Medicine

## 2021-02-13 VITALS — BP 124/82 | HR 93 | Temp 96.9°F | Wt 146.0 lb

## 2021-02-13 DIAGNOSIS — M069 Rheumatoid arthritis, unspecified: Secondary | ICD-10-CM | POA: Diagnosis not present

## 2021-02-13 DIAGNOSIS — I2510 Atherosclerotic heart disease of native coronary artery without angina pectoris: Secondary | ICD-10-CM | POA: Diagnosis not present

## 2021-02-13 DIAGNOSIS — J439 Emphysema, unspecified: Secondary | ICD-10-CM

## 2021-02-13 DIAGNOSIS — I209 Angina pectoris, unspecified: Secondary | ICD-10-CM | POA: Diagnosis not present

## 2021-02-13 LAB — LIPID PANEL
Chol/HDL Ratio: 2.3 ratio (ref 0.0–5.0)
Cholesterol, Total: 144 mg/dL (ref 100–199)
HDL: 63 mg/dL (ref >39)
LDL Chol Calc (NIH): 60 mg/dL (ref 0–99)
Triglycerides: 121 mg/dL (ref 0–149)
VLDL Cholesterol Cal: 21 mg/dL (ref 5–40)

## 2021-02-13 NOTE — Progress Notes (Signed)
   Subjective:    Patient ID: Tony Mcmillan, male    DOB: Jan 08, 1941, 80 y.o.   MRN: [id number]  HPI He is here for consult concerning left arm pain. He states on Saturday he noted more chest tightness than normal and then on Sunday he had left arm pain as well as chest tightness and shortness of breath. He took a nitroglycerin but is unsure how quickly it helped. It did take the pain away but the tightness is continued. He had no diaphoresis or weakness. He has a history of COPD with difficulty with shortness of breath from that. He had no difficulty at Monday or today. He states that he did use nitroglycerin approximately 3 weeks ago. He also has underlying history of rheumatoid arthritis and is unhappy with the care that he is getting would like to switch.   Review of Systems     Objective:   Physical Exam Alert and in no distress. Cardiac exam shows regular rhythm without murmurs or gallops. Medical record was reviewed and does show a large cardiac calcium score with evidence of three-vessel disease. He has not seen cardiology in quite some time. EKG read by me today shows a ventricular rate of 89 with other parameters normal. No ST changes. Normal R wave progression.       Assessment & Plan:  Coronary artery disease involving native heart without angina pectoris, unspecified vessel or lesion type - Plan: Lipid panel, Ambulatory referral to Cardiology, EKG 12-Lead  Pulmonary emphysema, unspecified emphysema type ([place])  3-vessel coronary artery disease - Plan: Lipid panel  Angina pectoris ([place]) - Plan: Ambulatory referral to Cardiology, EKG 12-Lead  Rheumatoid arthritis involving multiple sites, unspecified whether rheumatoid factor present ([place]) - Plan: Ambulatory referral to Rheumatology The next time you had the pain go ahead and take the nitroglycerin and if your symptoms are gone in5 minutes take another 1 and it is still not gone call an ambulance

## 2021-02-13 NOTE — Patient Instructions (Addendum)
The next time you had the pain go ahead and take the nitroglycerin and if your symptoms are gone in5 minutes take another 1 and it is still not gone call an ambulance

## 2021-02-14 ENCOUNTER — Encounter: Payer: Self-pay | Admitting: Family Medicine

## 2021-02-15 ENCOUNTER — Other Ambulatory Visit: Payer: Self-pay | Admitting: Gastroenterology

## 2021-02-15 ENCOUNTER — Encounter: Payer: Self-pay | Admitting: Family Medicine

## 2021-02-15 ENCOUNTER — Telehealth: Payer: Self-pay | Admitting: Cardiovascular Disease

## 2021-02-15 NOTE — Telephone Encounter (Signed)
Call transferred from scheduler to triage nurse.  Spoke with Pt.  Pt recently saw his PCP for chest tightness and SOB.  His PCP requested Pt be seen to evaluate (see PCP note from 02/13/21).  Pt's symptoms concerning with history of CAD.  Pt denies active chest pain at this time.  Complaints include chest tightness and SOB with associated left arm pain a few days ago.    Will schedule to see DOD 02/16/21 at 11:00 am for assessment.    Pt aware to call 911 for worsening symptoms.

## 2021-02-15 NOTE — Telephone Encounter (Signed)
Pt c/o of Chest Pain: STAT if CP now or developed within 24 hours  1. Are you having CP right now? Chest tightness right now  2. Are you experiencing any other symptoms (ex. SOB, nausea, vomiting, sweating)? no  3. How long have you been experiencing CP? "a while ago"  4. Is your CP continuous or coming and going? continuous  5. Have you taken Nitroglycerin? Not today.   Called to schedule referral, I offered patient 03/16/21 with Jory Sims but patient wanted to be seen sooner. He stated he was having active chest tightness.

## 2021-02-16 ENCOUNTER — Encounter: Payer: Self-pay | Admitting: *Deleted

## 2021-02-16 ENCOUNTER — Encounter: Payer: Self-pay | Admitting: Cardiovascular Disease

## 2021-02-16 ENCOUNTER — Other Ambulatory Visit: Payer: Self-pay

## 2021-02-16 ENCOUNTER — Ambulatory Visit (INDEPENDENT_AMBULATORY_CARE_PROVIDER_SITE_OTHER): Payer: Medicare HMO | Admitting: Cardiovascular Disease

## 2021-02-16 DIAGNOSIS — R072 Precordial pain: Secondary | ICD-10-CM

## 2021-02-16 NOTE — Progress Notes (Signed)
Chief Complaint  Patient presents with  . Follow-up    CAD    History of Present Illness: 80 yo male with history of CAD by coronary CTA, COPD, GERD, RA, PACs who is added on to my schedule today as the DOD for evaluation of chest pain. His primary cardiologist is Dr. Johnsie Cancel. Mild non-obstructive CAD by coronary CTA in April 2021. Normal stress test in April 2020. He has had chest pressure in the center of his chest for two weeks, never getting better or worse. No change with exertion. No change in baseline dyspnea. He has COPD. He was seen in primary care 02/13/21 and EKG was normal. No palpitations. No dizziness or near syncope.   Primary Care Physician: Girtha Rm, NP-C  Primary Cardiologist: Dr. Johnsie Cancel   Past Medical History:  Diagnosis Date  . 3-vessel coronary artery disease 03/10/2020  . Anemia   . Aortic valve calcification 03/10/2020  . Arthritis   . Asymptomatic microscopic hematuria 04/03/2016   Cystoscopy 02/207 Dr. Pilar [facility].    Marland Kitchen Atypical chest pain    a. 09/2016 MV: EF 63%, normal perfusion.  . Benign prostatic hyperplasia 04/03/2016   Asymptomatic. Normal PSA and exam by Dr. Pilar [facility]vis [facility] 01/2016.    . Bilateral carotid bruits 02/09/2019  . Bladder wall thickening 03/10/2020  . CAD (coronary artery disease)    Coronary artery calcification on Chest CT in 09/2016 and 01/2019 // Myoview 10/17: normal perfusion // Myoview 01/2019: no ischemia or infarction, EF 58 // Coronary CTA 03/2020: Calcium Score 1067; non-obstructive CAD [oLM 1-24, LAD ost 25049, mid and dist 1-24; D1 1-24; LCx prox and mid 1-24, OM1 and OM2 25-49, RCA and PLB 1-24; PLA 1-24]   . Carotid bruit    Carotid Dopplers 01/2019: no ICA stenosis bilaterally   . Colonic polyp 02/23/2016  . COVID-19   . DDD (degenerative disc disease), 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 L4-5 and L5-S1   . Diastolic dysfunction    a. 2015 Echo: EF >55%;  b. 09/2016 Echo: EF 55-60%, no rwma, Gr1 DD, triv  AI/MR, mild TR, PASP 62mmHg.  Marland Kitchen 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  . Duodenal ulcer 02/23/2016  . Emphysema of lung ([place]) 02/08/2016  . Enlarged prostate 03/10/2020  . GERD (gastroesophageal reflux disease)   . History of systemic steroid therapy 10/30/2017  . Hx of Blood Transfusion   . Intermittent palpitations 01/29/2019  . 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 Palpitations    Echo 10/17: Mild focal basal septal hypertrophy, EF 55-60, no RWMA, Gr 1 DD, trivial MR, normal RVSF, mild TR, PASP 28 // Event monitor 01/2019: NSR, PACs  . Prediabetes 10/29/2018  . Pulmonary emphysema ([place]) 12/18/2016  . Renal cyst, left 04/03/2016   On CT, benign   . Rheumatoid arthritis ([place])   . 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[place]) 11/04/2016   -RF,-CCP,-1433eta, erosive disease with contractures  . Tubular adenoma of colon 01/2016  . Vitamin D deficiency 11/04/2016    Past Surgical History:  Procedure Laterality Date  . ANTERIOR CERVICAL CORPECTOMY    . ANTERIOR CERVICAL CORPECTOMY  12/2014   for infection. this was in [place]  . CATARACT EXTRACTION     both eyes  . COLOSTOMY REVERSAL     had infection on buttocks and had to have a skin graft- had colostomy to help area stay clean and heal  . HERNIA REPAIR    .  SPINE SURGERY      Current Outpatient Medications  Medication Sig Dispense Refill  . albuterol (VENTOLIN HFA) 108 (90 Base) MCG/ACT inhaler Inhale 2 puffs into the lungs every 6 (six) hours as needed for wheezing or shortness of breath. 8 g 6  . Ascorbic Acid (VITAMIN C PO) Take by mouth daily.    Marland Kitchen aspirin EC 81 MG tablet Take 1 tablet (81 mg total) by mouth daily. 90 tablet 3  . atorvastatin (LIPITOR) 40 MG tablet TAKE 1 TABLET BY MOUTH EVERY DAY 90 tablet 2  . Budeson-Glycopyrrol-Formoterol (BREZTRI AEROSPHERE) 160-9-4.8 MCG/ACT AERO Inhale 2 puffs into the lungs in the morning and at bedtime. 5.9 g 0  . Calcium Carb-Cholecalciferol  (CALCIUM-VITAMIN D3) 600-400 MG-UNIT TABS Take by mouth.    . Ferrous Sulfate (IRON PO) Take by mouth daily.    Marland Kitchen MYRBETRIQ 50 MG TB24 tablet Take 50 mg by mouth daily.     . nitroGLYCERIN (NITROSTAT) 0.4 MG SL tablet Place 1 tablet (0.4 mg total) under the tongue every 5 (five) minutes as needed for chest pain. 25 tablet 6  . pantoprazole (PROTONIX) 40 MG tablet TAKE 1 TABLET BY MOUTH EVERY DAY 90 tablet 0  . Upadacitinib ER (RINVOQ) 15 MG TB24 Take 1 tablet by mouth daily. 90 tablet 0   No current facility-administered medications for this visit.    No Known Allergies  Social History   Socioeconomic History  . Marital status: Widowed    Spouse name: Not on file  . Number of children: Not on file  . Years of education: Not on file  . Highest education level: Not on file  Occupational History  . Not on file  Tobacco Use  . Smoking status: Former Smoker    Packs/day: 1.00    Years: 17.00    Pack years: 17.00    Types: Cigarettes    Quit date: 12/23/1993    Years since quitting: 27.1  . Smokeless tobacco: Never Used  Vaping Use  . Vaping Use: Never used  Substance and Sexual Activity  . Alcohol use: No    Alcohol/week: 0.0 standard drinks  . Drug use: No  . Sexual activity: Yes  Other Topics Concern  . Not on file  Social History Narrative   Retired. Lives alone.    Social Determinants of Health   Financial Resource Strain: Not on file  Food Insecurity: Not on file  Transportation Needs: Not on file  Physical Activity: Not on file  Stress: Not on file  Social Connections: Not on file  Intimate Partner Violence: Not on file    Family History  Problem Relation Age of Onset  . Hypertension Mother   . Hypertension Father   . Diabetes Father   . Heart disease Father        stents  . Hyperlipidemia Sister   . Diabetes Sister   . Stroke Brother   . Diabetes Brother   . Heart disease Brother        stents  . Colon polyps Neg Hx   . Esophageal cancer Neg Hx   .  Stomach cancer Neg Hx   . Rectal cancer Neg Hx   . Colon cancer Neg Hx     Review of Systems:  As stated in the HPI and otherwise negative.   BP 126/70   Pulse 95   Ht 5\' 2"  (1.575 m)   Wt 147 lb (66.7 kg)   SpO2 94%   BMI 26.89 kg/m  Physical Examination: General: Well developed, well nourished, NAD  HEENT: OP clear, mucus membranes moist  SKIN: warm, dry. No rashes. Neuro: No focal deficits  Musculoskeletal: Muscle strength 5/5 all ext  Psychiatric: Mood and affect normal  Neck: No JVD, no carotid bruits, no thyromegaly, no lymphadenopathy.  Lungs:Clear bilaterally, no wheezes, rhonci, crackles Cardiovascular: Regular rate and rhythm. No murmurs, gallops or rubs. Abdomen:Soft. Bowel sounds present. Non-tender.  Extremities: No lower extremity edema. Pulses are 2 + in the bilateral DP/PT.  EKG:  EKG is not ordered today. The ekg from 02/13/21 is reviewed by me and shows sinus rhythm without ischemic changes EKG today with sinus with no ischemic changes  Recent Labs: 12/27/2020: ALT 23; BUN 15; Creat 1.43; Hemoglobin 13.6; Platelets 309; Potassium 5.1; Sodium 135   Lipid Panel    Component Value Date/Time   CHOL 144 02/13/2021 0904   TRIG 121 02/13/2021 0904   HDL 63 02/13/2021 0904   CHOLHDL 2.3 02/13/2021 0904   CHOLHDL 3.2 09/23/2019 1330   VLDL 28 07/31/2017 1053   LDLCALC 60 02/13/2021 0904   LDLCALC 98 09/23/2019 1330     Wt Readings from Last 3 Encounters:  02/16/21 147 lb (66.7 kg)  02/13/21 146 lb (66.2 kg)  01/11/21 153 lb 9.6 oz (69.7 kg)    Assessment and Plan:   1. CAD with chest pain: He has had atypical chest pain that has not been felt to be cardiac for years. He has mild CAD by coronary CTA in April 2021. Normal stress test in 2020. His pain today seems atypical and not cardiac related. EKG today shows sinus rhythm with no ischemic changes. He does have mild CAD so wll arrange a nuclear stress test today to exclude ischemia although this is most  likely not cardiac chest pain.   Risks and benefits of stress test reviewed with pt. Informed consent placed on chart.    Disposition:   F/U with Dr. Johnsie Cancel or Richardson Dopp in 4-6 weeks.    Signed, Lauree Chandler, MD 02/16/2021 11:32 AM    [facility] [place], [place], [place]  [address] Phone: [phone]; Fax: [phone]

## 2021-02-16 NOTE — Patient Instructions (Addendum)
Medication Instructions:  No changes *If you need a refill on your cardiac medications before your next appointment, please call your pharmacy*   Lab Work: none If you have labs (blood work) drawn today and your tests are completely normal, you will receive your results only by: Marland Kitchen MyChart Message (if you have MyChart) OR . A paper copy in the mail If you have any lab test that is abnormal or we need to change your treatment, we will call you to review the results.   Testing/Procedures: Your physician has requested that you have a lexiscan myoview. For further information please visit [email or web address]. Please follow instruction sheet, as given.   Follow-Up: In 4-6 weeks with Richardson Dopp or Dr. Johnsie Cancel   Other Instructions

## 2021-02-26 ENCOUNTER — Telehealth: Payer: Self-pay | Admitting: Family Medicine

## 2021-02-26 ENCOUNTER — Ambulatory Visit: Payer: Medicare HMO | Admitting: Rheumatology

## 2021-02-26 ENCOUNTER — Encounter (HOSPITAL_COMMUNITY): Payer: Medicare HMO

## 2021-02-26 DIAGNOSIS — M0609 Rheumatoid arthritis without rheumatoid factor, multiple sites: Secondary | ICD-10-CM

## 2021-02-26 DIAGNOSIS — Z79899 Other long term (current) drug therapy: Secondary | ICD-10-CM

## 2021-02-26 DIAGNOSIS — M8589 Other specified disorders of bone density and structure, multiple sites: Secondary | ICD-10-CM

## 2021-02-26 DIAGNOSIS — R748 Abnormal levels of other serum enzymes: Secondary | ICD-10-CM

## 2021-02-26 DIAGNOSIS — Z8709 Personal history of other diseases of the respiratory system: Secondary | ICD-10-CM

## 2021-02-26 DIAGNOSIS — M5136 Other intervertebral disc degeneration, lumbar region: Secondary | ICD-10-CM

## 2021-02-26 DIAGNOSIS — Z8639 Personal history of other endocrine, nutritional and metabolic disease: Secondary | ICD-10-CM

## 2021-02-26 DIAGNOSIS — Z87438 Personal history of other diseases of male genital organs: Secondary | ICD-10-CM

## 2021-02-26 DIAGNOSIS — R3121 Asymptomatic microscopic hematuria: Secondary | ICD-10-CM

## 2021-02-26 DIAGNOSIS — Z86010 Personal history of colonic polyps: Secondary | ICD-10-CM

## 2021-02-26 DIAGNOSIS — D72810 Lymphocytopenia: Secondary | ICD-10-CM

## 2021-02-26 DIAGNOSIS — R059 Cough, unspecified: Secondary | ICD-10-CM

## 2021-02-26 DIAGNOSIS — Z8711 Personal history of peptic ulcer disease: Secondary | ICD-10-CM

## 2021-02-26 DIAGNOSIS — M5030 Other cervical disc degeneration, unspecified cervical region: Secondary | ICD-10-CM

## 2021-02-26 DIAGNOSIS — R0602 Shortness of breath: Secondary | ICD-10-CM

## 2021-02-26 DIAGNOSIS — E785 Hyperlipidemia, unspecified: Secondary | ICD-10-CM

## 2021-02-26 NOTE — Telephone Encounter (Signed)
Spoke to pt he  wanted to know about his gaps in care. [place]

## 2021-02-26 NOTE — Telephone Encounter (Signed)
Patient left 2 messages on my phone trying to return your call.  Please call him at [phone]

## 2021-02-27 ENCOUNTER — Telehealth (HOSPITAL_COMMUNITY): Payer: Self-pay

## 2021-02-27 NOTE — Telephone Encounter (Signed)
Spoke with the patient, detailed instructions given. He stated that he would be here for his test. Asked to call back with any questions. S. EMTP 

## 2021-03-01 ENCOUNTER — Ambulatory Visit (HOSPITAL_COMMUNITY): Payer: Medicare HMO | Attending: Cardiology

## 2021-03-01 ENCOUNTER — Other Ambulatory Visit: Payer: Self-pay

## 2021-03-01 VITALS — Ht 62.0 in | Wt 147.0 lb

## 2021-03-01 DIAGNOSIS — R072 Precordial pain: Secondary | ICD-10-CM | POA: Diagnosis present

## 2021-03-01 DIAGNOSIS — I2510 Atherosclerotic heart disease of native coronary artery without angina pectoris: Secondary | ICD-10-CM | POA: Diagnosis present

## 2021-03-01 LAB — MYOCARDIAL PERFUSION IMAGING
LV dias vol: 39 mL (ref 62–150)
LV sys vol: 11 mL
Peak HR: 114 {beats}/min
Rest HR: 66 {beats}/min
SDS: 0
SRS: 0
SSS: 0
TID: 1.01

## 2021-03-01 MED ORDER — REGADENOSON 0.4 MG/5ML IV SOLN
0.4000 mg | Freq: Once | INTRAVENOUS | Status: AC
  Administered 2021-03-01: 0.4 mg via INTRAVENOUS

## 2021-03-01 MED ORDER — TECHNETIUM TC 99M TETROFOSMIN IV KIT
10.1000 | PACK | Freq: Once | INTRAVENOUS | Status: AC | PRN
  Administered 2021-03-01: 10.1 via INTRAVENOUS
  Filled 2021-03-01: qty 11

## 2021-03-01 MED ORDER — TECHNETIUM TC 99M TETROFOSMIN IV KIT
31.1000 | PACK | Freq: Once | INTRAVENOUS | Status: AC | PRN
  Administered 2021-03-01: 31.1 via INTRAVENOUS
  Filled 2021-03-01: qty 32

## 2021-03-22 NOTE — Progress Notes (Addendum)
Cardiology Office Note:    Date:  03/23/2021   ID:  Tony Mcmillan, DOB February 01, 1941, MRN [id number]  PCP:  Girtha Rm, NP-C   [facility]  Cardiologist:  Jenkins Rouge, MD   Advanced Practice Provider:  Liliane Shi, PA-C Electrophysiologist:  None       Referring MD: Girtha Rm, NP-C   Chief Complaint:  Follow-up (Chest pain, recent stress test)    Patient Profile:     Tony Mcmillan is a 80 y.o. male with:   Coronary artery disease, non-obstructive  ? Coronary artery calcification, aortic atherosclerosis on Chest CT 11/2016, 02/2020 ? Cor CTA 03/2020: Ca score 1067; LM 1-24, oLAD 25-49, m+dLAD 1-24, D1 1-24; p+mLCx 1-24; OM1 and OM2 24-59; RCA and PLB 1-24; PLA 1-24  Emphysema  Chest pain  ? Myoview 09/2016: normal perfusion ? Myoview 01/2019: no ischemia or scar, EF 58  GERD  Rheumatoid Arthritis  Prior CV studies: Myoview 03/01/21 EF 72, inf artifact, normal perfusion, low risk   Coronary CTA 03/2020:  Calcium Score 1067; non-obstructive CAD [oLM 1-24, LAD ost 25-49, mid and dist 1-24; D1 1-24; LCx prox and mid 1-24, OM1 and OM2 25-49, RCA and PLB 1-24; PLA 1-24]   Event Monitor 02/16/2019 NSR, PACs  Myoview 02/16/2019 EF 58, no ischemia or infarction  Carotid US 02/15/2019 No ICA stenosis   Myoview 10/14/16 EF 63, normal perfusion; Low Risk  Echocardiogram 10/14/16 Mild focal basal septal hypertrophy, EF 55-60, no RWMA, Gr 1 DD, trivial MR, normal RVSF, mild TR, PASP 28     History of Present Illness:    Tony Mcmillan was last seen in clinic by Dr. Angelena Form for chest pain.  A nuclear stress test showed no ischemia and was low risk.  He returns for f/u.  He is here alone.  He continues to have episodes of left-sided chest discomfort.  It is sharp and sometimes goes away on its own.  He sometimes takes nitroglycerin to make it go away.  He has not had exertional symptoms.  He has not had significant shortness of breath,  orthopnea, leg edema, syncope.         Past Medical History:  Diagnosis Date  . 3-vessel coronary artery disease 03/10/2020  . Anemia   . Aortic valve calcification 03/10/2020  . Arthritis   . Asymptomatic microscopic hematuria 04/03/2016   Cystoscopy 02/207 Dr. Pilar [facility]vis [facility].    Marland Kitchen Atypical chest pain    a. 09/2016 MV: EF 63%, normal perfusion.  . Benign prostatic hyperplasia 04/03/2016   Asymptomatic. Normal PSA and exam by Dr. Pilar [facility]vis [facility] 01/2016.    . Bilateral carotid bruits 02/09/2019  . Bladder wall thickening 03/10/2020  . CAD (coronary artery disease)    Coronary artery calcification on Chest CT in 09/2016 and 01/2019 // Myoview 10/17: normal perfusion // Myoview 01/2019: no ischemia or infarction, EF 58 // Coronary CTA 03/2020: Calcium Score 1067; non-obstructive CAD [oLM 1-24, LAD ost 25049, mid and dist 1-24; D1 1-24; LCx prox and mid 1-24, OM1 and OM2 25-49, RCA and PLB 1-24; PLA 1-24]   . Carotid bruit    Carotid Dopplers 01/2019: no ICA stenosis bilaterally   . Colonic polyp 02/23/2016  . COVID-19   . DDD (degenerative disc disease), 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 L4-5 and L5-S1   . Diastolic dysfunction    a. 2015 Echo: EF >55%;  b. 09/2016 Echo: EF 55-60%, no rwma, Gr1 DD, triv AI/MR,  mild TR, PASP 35mmHg.  Marland Kitchen 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  . Duodenal ulcer 02/23/2016  . Emphysema of lung ([place]) 02/08/2016  . Enlarged prostate 03/10/2020  . GERD (gastroesophageal reflux disease)   . History of systemic steroid therapy 10/30/2017  . Hx of Blood Transfusion   . Intermittent palpitations 01/29/2019  . 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 Palpitations    Echo 10/17: Mild focal basal septal hypertrophy, EF 55-60, no RWMA, Gr 1 DD, trivial MR, normal RVSF, mild TR, PASP 28 // Event monitor 01/2019: NSR, PACs  . Prediabetes 10/29/2018  . Pulmonary emphysema ([place]) 12/18/2016  . Renal cyst, left 04/03/2016   On CT,  benign   . Rheumatoid arthritis ([place])   . 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[place]) 11/04/2016   -RF,-CCP,-1433eta, erosive disease with contractures  . Tubular adenoma of colon 01/2016  . Vitamin D deficiency 11/04/2016    Current Medications: Current Meds  Medication Sig  . albuterol (VENTOLIN HFA) 108 (90 Base) MCG/ACT inhaler Inhale 2 puffs into the lungs every 6 (six) hours as needed for wheezing or shortness of breath.  . Ascorbic Acid (VITAMIN C PO) Take 1 tablet by mouth daily.  Marland Kitchen aspirin EC 81 MG tablet Take 1 tablet (81 mg total) by mouth daily.  Marland Kitchen atorvastatin (LIPITOR) 40 MG tablet TAKE 1 TABLET BY MOUTH EVERY DAY  . Budeson-Glycopyrrol-Formoterol (BREZTRI AEROSPHERE) 160-9-4.8 MCG/ACT AERO Inhale 2 puffs into the lungs in the morning and at bedtime.  . Calcium Carb-Cholecalciferol (CALCIUM-VITAMIN D3) 600-400 MG-UNIT TABS Take 1 tablet by mouth 2 (two) times daily.  . metoprolol succinate (TOPROL XL) 25 MG 24 hr tablet Take 1 tablet (25 mg total) by mouth at bedtime.  Marland Kitchen MYRBETRIQ 50 MG TB24 tablet Take 50 mg by mouth daily.   . nitroGLYCERIN (NITROSTAT) 0.4 MG SL tablet Place 1 tablet (0.4 mg total) under the tongue every 5 (five) minutes as needed for chest pain.  . pantoprazole (PROTONIX) 40 MG tablet TAKE 1 TABLET BY MOUTH EVERY DAY     Allergies:   Patient has no known allergies.   Social History   Tobacco Use  . Smoking status: Former Smoker    Packs/day: 1.00    Years: 17.00    Pack years: 17.00    Types: Cigarettes    Quit date: 12/23/1993    Years since quitting: 27.2  . Smokeless tobacco: Never Used  Vaping Use  . Vaping Use: Never used  Substance Use Topics  . Alcohol use: No    Alcohol/week: 0.0 standard drinks  . Drug use: No     Family Hx: The patient's family history includes Diabetes in his brother, father, and sister; Heart disease in his brother and father; Hyperlipidemia in his sister; Hypertension in his father and mother; Stroke in  his brother. There is no history of Colon polyps, Esophageal cancer, Stomach cancer, Rectal cancer, or Colon cancer.  Review of Systems  Gastrointestinal: Negative for dysphagia, heartburn, hematemesis, hematochezia and melena.     EKGs/Labs/Other Test Reviewed:    EKG:  EKG is not ordered today.  The ekg ordered today demonstrates n/a  Recent Labs: 12/27/2020: ALT 23; BUN 15; Creat 1.43; Hemoglobin 13.6; Platelets 309; Potassium 5.1; Sodium 135   Recent Lipid Panel Lab Results  Component Value Date/Time   CHOL 144 02/13/2021 09:04 AM   TRIG 121 02/13/2021 09:04 AM   HDL 63 02/13/2021 09:04 AM   CHOLHDL 2.3 02/13/2021  09:04 AM   CHOLHDL 3.2 09/23/2019 01:30 PM   LDLCALC 60 02/13/2021 09:04 AM   LDLCALC 98 09/23/2019 01:30 PM      Risk Assessment/Calculations:      Physical Exam:    VS:  BP 120/60   Pulse 93   Ht 5\' 2"  (1.575 m)   Wt 146 lb (66.2 kg)   SpO2 95%   BMI 26.70 kg/m     Wt Readings from Last 3 Encounters:  03/23/21 146 lb (66.2 kg)  03/01/21 147 lb (66.7 kg)  02/16/21 147 lb (66.7 kg)     Constitutional:      Appearance: Healthy appearance. Not in distress.  Neck:     Vascular: JVD normal.  Pulmonary:     Effort: Pulmonary effort is normal.     Breath sounds: No wheezing. No rales.  Cardiovascular:     Normal rate. Regular rhythm. Normal S1. Normal S2.     Murmurs: There is no murmur.  Edema:    Peripheral edema absent.  Abdominal:     Palpations: Abdomen is soft. There is no hepatomegaly.  Skin:    General: Skin is warm and dry.  Neurological:     General: No focal deficit present.     Mental Status: Alert and oriented to person, place and time.     Cranial Nerves: Cranial nerves are intact.         ASSESSMENT & PLAN:    1. Coronary artery disease involving native coronary artery of native heart with angina pectoris [facility]) He has a history of mild to moderate nonobstructive coronary disease by coronary CTA in 4/21.  Recent Myoview was  low risk without ischemia.  He continues to have left-sided chest discomfort.  He has relief with nitroglycerin at times.  Question if he could have microvascular angina.  His heart rate tends to run in the 80s to 90s.  I will try him on metoprolol succinate 25 mg daily to see if this improves any of his symptoms.  Continue aspirin, atorvastatin.  Follow-up in 6 months.  2. Mixed hyperlipidemia LDL optimal on most recent lab work.  Continue current Rx.       Dispo:  Return in about 6 months (around 09/22/2021) for Routine Follow Up, w/ Richardson Dopp, PA-C.   Medication Adjustments/Labs and Tests Ordered: Current medicines are reviewed at length with the patient today.  Concerns regarding medicines are outlined above.  Tests Ordered: No orders of the defined types were placed in this encounter.  Medication Changes: Meds ordered this encounter  Medications  . metoprolol succinate (TOPROL XL) 25 MG 24 hr tablet    Sig: Take 1 tablet (25 mg total) by mouth at bedtime.    Dispense:  90 tablet    Refill:  1    Signed, Richardson Dopp, PA-C  03/23/2021 12:56 PM    [facility] [place], [place], [place]  [address] Phone: [phone]; Fax: [phone]

## 2021-03-23 ENCOUNTER — Ambulatory Visit (INDEPENDENT_AMBULATORY_CARE_PROVIDER_SITE_OTHER): Payer: Medicare HMO | Admitting: Physician Assistant

## 2021-03-23 ENCOUNTER — Encounter: Payer: Self-pay | Admitting: Physician Assistant

## 2021-03-23 ENCOUNTER — Other Ambulatory Visit: Payer: Self-pay

## 2021-03-23 VITALS — BP 120/60 | HR 93 | Ht 62.0 in | Wt 146.0 lb

## 2021-03-23 DIAGNOSIS — E782 Mixed hyperlipidemia: Secondary | ICD-10-CM

## 2021-03-23 DIAGNOSIS — I2510 Atherosclerotic heart disease of native coronary artery without angina pectoris: Secondary | ICD-10-CM

## 2021-03-23 DIAGNOSIS — R072 Precordial pain: Secondary | ICD-10-CM

## 2021-03-23 DIAGNOSIS — I25119 Atherosclerotic heart disease of native coronary artery with unspecified angina pectoris: Secondary | ICD-10-CM

## 2021-03-23 MED ORDER — METOPROLOL SUCCINATE ER 25 MG PO TB24: 25.0000 mg | ORAL_TABLET | Freq: Every evening | ORAL | 1 refills | Status: DC

## 2021-03-23 NOTE — Patient Instructions (Addendum)
Medication Instructions:  Your physician has recommended you make the following change in your medication:  1.  START Toprol Xl 25 mg taking 1 tablet at bed time  *If you need a refill on your cardiac medications before your next appointment, please call your pharmacy*   Lab Work: None ordered  If you have labs (blood work) drawn today and your tests are completely normal, you will receive your results only by: Marland Kitchen MyChart Message (if you have MyChart) OR . A paper copy in the mail If you have any lab test that is abnormal or we need to change your treatment, we will call you to review the results.   Testing/Procedures: None ordered     Follow-Up: At [facility], you and your health needs are our priority.  As part of our continuing mission to provide you with exceptional heart care, we have created designated Provider Care Teams.  These Care Teams include your primary Cardiologist (physician) and Advanced Practice Providers (APPs -  Physician Assistants and Nurse Practitioners) who all work together to provide you with the care you need, when you need it.  We recommend signing up for the patient portal called "MyChart".  Sign up information is provided on this After Visit Summary.  MyChart is used to connect with patients for Virtual Visits (Telemedicine).  Patients are able to view lab/test results, encounter notes, upcoming appointments, etc.  Non-urgent messages can be sent to your provider as well.   To learn more about what you can do with MyChart, go to [email or web address].    Your next appointment:   6 month(s)  The format for your next appointment:   In Person  Provider:   You may see Richardson Dopp, PA-C   Other Instructions

## 2021-03-30 ENCOUNTER — Encounter: Payer: Self-pay | Admitting: Internal Medicine

## 2021-04-09 ENCOUNTER — Ambulatory Visit (INDEPENDENT_AMBULATORY_CARE_PROVIDER_SITE_OTHER): Payer: Medicare HMO | Admitting: Family Medicine

## 2021-04-09 ENCOUNTER — Encounter: Payer: Self-pay | Admitting: Family Medicine

## 2021-04-09 VITALS — BP 110/68 | HR 80 | Wt 145.2 lb

## 2021-04-09 DIAGNOSIS — R7303 Prediabetes: Secondary | ICD-10-CM

## 2021-04-09 DIAGNOSIS — M069 Rheumatoid arthritis, unspecified: Secondary | ICD-10-CM

## 2021-04-09 LAB — POCT GLYCOSYLATED HEMOGLOBIN (HGB A1C): Hemoglobin A1C: 5.9 % — AB (ref 4.0–5.6)

## 2021-04-09 NOTE — Patient Instructions (Signed)
Please call Dr. Lynne Leader office at [facility] [phone]

## 2021-04-09 NOTE — Progress Notes (Signed)
   Subjective:    Patient ID: Tony Mcmillan, male    DOB: 1941-08-15, 80 y.o.   MRN: [id number]  HPI Chief Complaint  Patient presents with  . med check    Med check. Would like colonoscopy   He is here for a medication management visit.  He sees several specialists.   Other doctors caring for patient include: Dr. Kathlen Mody- Cardiology Dr. Fuller Plan- GI Dr. Estanislado Pandy- Rheumatology Dr. Chase Caller -pulmonology [facility]  Prediabetes- last Hgb A1c 6.1% in October 2021.  He does not check BS at home. Highest A1c appears to have been 6.4% in 2018.    Denies fever, chills, dizziness, chest pain, palpitations, shortness of breath, abdominal pain, N/V/D, LE edema.    Reviewed allergies, medications, past medical, surgical, family, and social history.    Review of Systems Pertinent positives and negatives in the history of present illness.     Objective:   Physical Exam BP 110/68   Pulse 80   Wt 145 lb 3.2 oz (65.9 kg)   SpO2 98%   BMI 26.56 kg/m   Alert and in no distress.  Respirations unlabored. Extremities without edema. Skin is warm and dry. Mood  Is good.       Assessment & Plan:  Prediabetes - Plan: HgB A1c  Rheumatoid arthritis involving multiple sites, unspecified whether rheumatoid factor present ([place])  Reports being in his usual state of health. No new concerns today.  Hgb A1c 5.9% today and improved prediabetes.  Continue watching sugar and carbohydrates. Stay active.  Continue seeing specialists as recommended.  Follow up for AWV

## 2021-04-22 ENCOUNTER — Other Ambulatory Visit: Payer: Self-pay | Admitting: Cardiovascular Disease

## 2021-04-22 DIAGNOSIS — E7800 Pure hypercholesterolemia, unspecified: Secondary | ICD-10-CM

## 2021-05-12 ENCOUNTER — Other Ambulatory Visit: Payer: Self-pay | Admitting: Gastroenterology

## 2021-06-11 ENCOUNTER — Telehealth: Payer: Self-pay | Admitting: *Deleted

## 2021-06-11 NOTE — Telephone Encounter (Signed)
I called patient and gave him Dr.Stark's recommendations. Patient wants to proceed with the colonoscopy as scheduled.

## 2021-06-11 NOTE — Telephone Encounter (Signed)
Dr.Stark,  This 80 y.o. patient had an OV with you on 03/01/2020, per OV notes " Personal history of adenomatous colon polyps.  A 5-year interval surveillance colonoscopy was initially recommended for 2022 however given his age we will not plan for future surveillance colonoscopies."   Looks like the patient was contacted by his PCP on 03/30/21 about needing a colonoscopy so he called and made a recall colon appointment. Please review. Ok to proceed with colonoscopy or OV?  Please advise.Thank you,  pv

## 2021-06-11 NOTE — Telephone Encounter (Signed)
It's the patients choice on proceeding with colonoscopy or not. An office visit is not needed. Given his age he can discontinue surveillance colonoscopies. If he would like to proceed with one more colonoscopy that is fine and we will schedule it.

## 2021-06-15 ENCOUNTER — Ambulatory Visit: Payer: Medicare HMO | Admitting: Family Medicine

## 2021-06-18 ENCOUNTER — Ambulatory Visit: Payer: Medicare HMO | Admitting: Family Medicine

## 2021-06-28 ENCOUNTER — Other Ambulatory Visit: Payer: Self-pay

## 2021-06-28 ENCOUNTER — Ambulatory Visit (AMBULATORY_SURGERY_CENTER): Payer: Medicare HMO | Admitting: *Deleted

## 2021-06-28 VITALS — Ht 62.0 in | Wt 138.6 lb

## 2021-06-28 DIAGNOSIS — Z86010 Personal history of colonic polyps: Secondary | ICD-10-CM

## 2021-06-28 MED ORDER — PEG 3350-KCL-NA BICARB-NACL 420 G PO SOLR: 4000.0000 mL | Freq: Once | ORAL | 0 refills | Status: AC

## 2021-06-28 NOTE — Progress Notes (Signed)
No egg or soy allergy known to patient  No issues with past sedation with any surgeries or procedures Patient denies ever being told they had issues or difficulty with intubation  No FH of Malignant Hyperthermia No diet pills per patient No home 02 use per patient  No blood thinners per patient  Pt states  issues with constipation daily- hard stools , pt has a BM every day -  issues x 1 yr per pt  - he doesn't use stool softener - 2 day golytely prep   No A fib or A flutter  EMMI video to pt or via [place] 19 guidelines implemented in [place] today with Pt and RN  Pt is fully vaccinated  for Covid   Due to the COVID-19 pandemic we are asking patients to follow certain guidelines.  Pt aware of COVID protocols and [facility] guidelines

## 2021-07-09 ENCOUNTER — Encounter: Payer: Self-pay | Admitting: Gastroenterology

## 2021-07-09 ENCOUNTER — Other Ambulatory Visit: Payer: Self-pay

## 2021-07-09 ENCOUNTER — Ambulatory Visit (AMBULATORY_SURGERY_CENTER): Payer: Medicare HMO | Admitting: Gastroenterology

## 2021-07-09 VITALS — BP 114/70 | HR 68 | Temp 98.7°F | Resp 16 | Ht 62.0 in | Wt 138.0 lb

## 2021-07-09 DIAGNOSIS — Z86010 Personal history of colonic polyps: Secondary | ICD-10-CM

## 2021-07-09 MED ORDER — SODIUM CHLORIDE 0.9 % IV SOLN: 500.0000 mL | Freq: Once | INTRAVENOUS | Status: DC

## 2021-07-09 NOTE — Progress Notes (Signed)
No problems noted in the recovery room. maw 

## 2021-07-09 NOTE — Patient Instructions (Signed)
HANDOUT ON DIVERTICULOSIS & HIGH FIBER DIET GIVEN TO YOU TODAY    YOU HAD AN ENDOSCOPIC PROCEDURE TODAY AT [facility]:   Refer to the procedure report that was given to you for any specific questions about what was found during the examination.  If the procedure report does not answer your questions, please call your gastroenterologist to clarify.  If you requested that your care partner not be given the details of your procedure findings, then the procedure report has been included in a sealed envelope for you to review at your convenience later.  YOU SHOULD EXPECT: Some feelings of bloating in the abdomen. Passage of more gas than usual.  Walking can help get rid of the air that was put into your GI tract during the procedure and reduce the bloating. If you had a lower endoscopy (such as a colonoscopy or flexible sigmoidoscopy) you may notice spotting of blood in your stool or on the toilet paper. If you underwent a bowel prep for your procedure, you may not have a normal bowel movement for a few days.  Please Note:  You might notice some irritation and congestion in your nose or some drainage.  This is from the oxygen used during your procedure.  There is no need for concern and it should clear up in a day or so.  SYMPTOMS TO REPORT IMMEDIATELY:  Following lower endoscopy (colonoscopy or flexible sigmoidoscopy):  Excessive amounts of blood in the stool  Significant tenderness or worsening of abdominal pains  Swelling of the abdomen that is new, acute  Fever of 100F or higher   For urgent or emergent issues, a gastroenterologist can be reached at any hour by calling [phone]. Do not use MyChart messaging for urgent concerns.    DIET:  We do recommend a small meal at first, but then you may proceed to your regular diet.  Drink plenty of fluids but you should avoid alcoholic beverages for 24 hours.  ACTIVITY:  You should plan to take it easy for the rest of today  and you should NOT DRIVE or use heavy machinery until tomorrow (because of the sedation medicines used during the test).    FOLLOW UP: Our staff will call the number listed on your records 48-72 hours following your procedure to check on you and address any questions or concerns that you may have regarding the information given to you following your procedure. If we do not reach you, we will leave a message.  We will attempt to reach you two times.  During this call, we will ask if you have developed any symptoms of COVID 19. If you develop any symptoms (ie: fever, flu-like symptoms, shortness of breath, cough etc.) before then, please call [phone].  If you test positive for Covid 19 in the 2 weeks post procedure, please call and report this information to [place].    If any biopsies were taken you will be contacted by phone or by letter within the next 1-3 weeks.  Please call us at [phone] if you have not heard about the biopsies in 3 weeks.    SIGNATURES/CONFIDENTIALITY: You and/or your care partner have signed paperwork which will be entered into your electronic medical record.  These signatures attest to the fact that that the information above on your After Visit Summary has been reviewed and is understood.  Full responsibility of the confidentiality of this discharge information lies with you and/or your care-partner.

## 2021-07-09 NOTE — Progress Notes (Signed)
Report given to PACU, vss 

## 2021-07-09 NOTE — Op Note (Signed)
[facility] Patient Name: Tony Mcmillan Procedure Date: 07/09/2021 11:33 AM MRN: [id number] Endoscopist: Ladene Artist , MD Age: 80 Referring MD:  Date of Birth: 04/15/1941 Gender: Male Account #: [id number] Procedure:                Colonoscopy Indications:              Surveillance: Personal history of adenomatous                            polyps on last colonoscopy 5 years ago Medicines:                Monitored Anesthesia Care Procedure:                Pre-Anesthesia Assessment:                           - Prior to the procedure, a History and Physical                            was performed, and patient medications and                            allergies were reviewed. The patient's tolerance of                            previous anesthesia was also reviewed. The risks                            and benefits of the procedure and the sedation                            options and risks were discussed with the patient.                            All questions were answered, and informed consent                            was obtained. Prior Anticoagulants: The patient has                            taken no previous anticoagulant or antiplatelet                            agents. ASA Grade Assessment: III - A patient with                            severe systemic disease. After reviewing the risks                            and benefits, the patient was deemed in                            satisfactory condition to undergo the procedure.  After obtaining informed consent, the colonoscope                            was passed under direct vision. Throughout the                            procedure, the patient's blood pressure, pulse, and                            oxygen saturations were monitored continuously. The                            CF [id number] #[id number] was introduced through the anus                            and advanced to the the  cecum, identified by                            appendiceal orifice and ileocecal valve. The                            ileocecal valve, appendiceal orifice, and rectum                            were photographed. The quality of the bowel                            preparation was adequate. The colonoscopy was                            performed without difficulty. The patient tolerated                            the procedure well. Scope In: 11:39:23 AM Scope Out: 11:48:39 AM Scope Withdrawal Time: 0 hours 7 minutes 23 seconds  Total Procedure Duration: 0 hours 9 minutes 16 seconds  Findings:                 The perianal and digital rectal examinations were                            normal.                           Many small-mouthed diverticula were found in the                            entire colon. There was no evidence of diverticular                            bleeding.                           A single medium-sized localized angiodysplastic  lesion without bleeding was found in the cecum.                           There was evidence of a prior end-to-end                            colo-colonic anastomosis in the sigmoid colon. This                            was patent and was characterized by healthy                            appearing mucosa. The anastomosis was traversed.                           The exam was otherwise without abnormality on                            direct and retroflexion views. Complications:            No immediate complications. Estimated blood loss:                            None. Estimated Blood Loss:     Estimated blood loss: none. Impression:               - Moderate diverticulosis in the entire examined                            colon.                           - A single non-bleeding colonic angiodysplastic                            lesion.                           - Prior end-to-end colo-colonic sigmoid  colon                            anastomosis, healthy appreaing.                           - The examination was otherwise normal on direct                            and retroflexion views.                           - No specimens collected. Recommendation:           - Patient has a contact number available for                            emergencies. The signs and symptoms of potential  delayed complications were discussed with the                            patient. Return to normal activities tomorrow.                            Written discharge instructions were provided to the                            patient.                           - High fiber diet.                           - Continue present medications.                           - No repeat colonoscopy due to age and the absence                            of colonic polyps. Ladene Artist, MD 07/09/2021 11:54:09 AM This report has been signed electronically.

## 2021-07-09 NOTE — Progress Notes (Signed)
Pt's states no medical or surgical changes since previsit or office visit. 

## 2021-07-11 ENCOUNTER — Telehealth: Payer: Self-pay | Admitting: *Deleted

## 2021-07-11 ENCOUNTER — Ambulatory Visit: Payer: Medicare HMO | Admitting: Primary Care

## 2021-07-11 NOTE — Telephone Encounter (Signed)
  Follow up Call-  Call back number 07/09/2021  Post procedure Call Back phone  # [phone]  Permission to leave phone message Yes  Some recent data might be hidden     Patient questions:  Do you have a fever, pain , or abdominal swelling? No. Pain Score  0 *  Have you tolerated food without any problems?  Yes.  Have you been able to return to your normal activities? Yes.    Do you have any questions about your discharge instructions: Diet   No. Medications  No. Follow up visit  No.  Do you have questions or concerns about your Care? No.  Actions: * If pain score is 4 or above: No action needed, pain <4.

## 2021-07-16 ENCOUNTER — Other Ambulatory Visit: Payer: Self-pay

## 2021-07-16 ENCOUNTER — Encounter: Payer: Self-pay | Admitting: Primary Care

## 2021-07-16 ENCOUNTER — Ambulatory Visit (INDEPENDENT_AMBULATORY_CARE_PROVIDER_SITE_OTHER): Payer: Medicare HMO | Admitting: Primary Care

## 2021-07-16 VITALS — BP 112/68 | HR 77 | Ht 62.0 in | Wt 136.0 lb

## 2021-07-16 DIAGNOSIS — M069 Rheumatoid arthritis, unspecified: Secondary | ICD-10-CM | POA: Diagnosis not present

## 2021-07-16 DIAGNOSIS — R0602 Shortness of breath: Secondary | ICD-10-CM | POA: Diagnosis not present

## 2021-07-16 DIAGNOSIS — J439 Emphysema, unspecified: Secondary | ICD-10-CM | POA: Diagnosis not present

## 2021-07-16 MED ORDER — BEVESPI AEROSPHERE 9-4.8 MCG/ACT IN AERO: 2.0000 | INHALATION_SPRAY | Freq: Two times a day (BID) | RESPIRATORY_TRACT | 6 refills | Status: DC

## 2021-07-16 NOTE — Assessment & Plan Note (Addendum)
Stable interval. Patient experiences occasional chest tightness and wheezing. He is not currently on any maintenance inhalers. Rare SABA use 1-2 times a month. He prefers Agricultural consultant. No recent exacerbations requiring abx or oral steroids. CAT score 8. Advised patient to resume Bevespi (LABA/LAMA) 2 puffs twice daily and start using incentive spirometer daily.   Plan:  Resume Bevespi two puffs morning and evening Use incentive spirometer every hour while awake to encourage deep breathing   Orders: Incentive spirometer  Follow-up: 6 month follow-up with Dr. Chase Caller or sooner if needed

## 2021-07-16 NOTE — Assessment & Plan Note (Signed)
-   No evidence of ILD on HRCT imaging in January 2022  - Currently on Remicade and Arava for RA - Following with rheumatology

## 2021-07-16 NOTE — Patient Instructions (Addendum)
Nice seeing you today Tony Mcmillan  Recommendations: Resume Bevespi two puffs morning and evening Use incentive spirometer every hour while awake to encourage deep breathing   Orders: Incentive spirometer  Follow-up: 6 month follow-up with Dr. Chase Caller or sooner if needed    Chronic Obstructive Pulmonary Disease  Chronic obstructive pulmonary disease (COPD) is a long-term (chronic) lung problem. When you have COPD, it is hard for air to get in and out ofyour lungs. Usually the condition gets worse over time, and your lungs will never return tonormal. There are things you can do to keep yourself as healthy as possible. What are the causes? Smoking. This is the most common cause. Certain genes passed from parent to child (inherited). What increases the risk? Being exposed to secondhand smoke from cigarettes, pipes, or cigars. Being exposed to chemicals and other irritants, such as fumes and dust in the work environment. Having chronic lung conditions or infections. What are the signs or symptoms? Shortness of breath, especially during physical activity. A long-term cough with a large amount of thick mucus. Sometimes, the cough may not have any mucus (dry cough). Wheezing. Breathing quickly. Skin that looks gray or blue, especially in the fingers, toes, or lips. Feeling tired (fatigue). Weight loss. Chest tightness. Having infections often. Episodes when breathing symptoms become much worse (exacerbations). At the later stages of this disease, you may have swelling in the ankles, feet,or legs. How is this treated? Taking medicines. Quitting smoking, if you smoke. Rehabilitation. This includes steps to make your body work better. It may involve a team of specialists. Doing exercises. Making changes to your diet. Using oxygen. Lung surgery. Lung transplant. Comfort measures (palliative care). Follow these instructions at home: Medicines Take over-the-counter and prescription  medicines only as told by your doctor. Talk to your doctor before taking any cough or allergy medicines. You may need to avoid medicines that cause your lungs to be dry. Lifestyle If you smoke, stop smoking. Smoking makes the problem worse. Do not smoke or use any products that contain nicotine or tobacco. If you need help quitting, ask your doctor. Avoid being around things that make your breathing worse. This may include smoke, chemicals, and fumes. Stay active, but remember to rest as well. Learn and use tips on how to manage stress and control your breathing. Make sure you get enough sleep. Most adults need at least 7 hours of sleep every night. Eat healthy foods. Eat smaller meals more often. Rest before meals. Controlled breathing Learn and use tips on how to control your breathing as told by your doctor. Try: Breathing in (inhaling) through your nose for 1 second. Then, pucker your lips and breath out (exhale) through your lips for 2 seconds. Putting one hand on your belly (abdomen). Breathe in slowly through your nose for 1 second. Your hand on your belly should move out. Pucker your lips and breathe out slowly through your lips. Your hand on your belly should move in as you breathe out.  Controlled coughing Learn and use controlled coughing to clear mucus from your lungs. Follow these steps: Lean your head a little forward. Breathe in deeply. Try to hold your breath for 3 seconds. Keep your mouth slightly open while coughing 2 times. Spit any mucus out into a tissue. Rest and do the steps again 1 or 2 times as needed. General instructions Make sure you get all the shots (vaccines) that your doctor recommends. Ask your doctor about a flu shot and a pneumonia shot.  Use oxygen therapy and pulmonary rehabilitation if told by your doctor. If you need home oxygen therapy, ask your doctor if you should buy a tool to measure your oxygen level (oximeter). Make a COPD action plan with your  doctor. This helps you to know what to do if you feel worse than usual. Manage any other conditions you have as told by your doctor. Avoid going outside when it is very hot, cold, or humid. Avoid people who have a sickness you can catch (contagious). Keep all follow-up visits. Contact a doctor if: You cough up more mucus than usual. There is a change in the color or thickness of the mucus. It is harder to breathe than usual. Your breathing is faster than usual. You have trouble sleeping. You need to use your medicines more often than usual. You have trouble doing your normal activities such as getting dressed or walking around the house. Get help right away if: You have shortness of breath while resting. You have shortness of breath that stops you from: Being able to talk. Doing normal activities. Your chest hurts for longer than 5 minutes. Your skin color is more blue than usual. Your pulse oximeter shows that you have low oxygen for longer than 5 minutes. You have a fever. You feel too tired to breathe normally. These symptoms may represent a serious problem that is an emergency. Do not wait to see if the symptoms will go away. Get medical help right away. Call your local emergency services (911 in the [place] Do not drive yourself to the hospital. Summary Chronic obstructive pulmonary disease (COPD) is a long-term lung problem. The way your lungs work will never return to normal. Usually the condition gets worse over time. There are things you can do to keep yourself as healthy as possible. Take over-the-counter and prescription medicines only as told by your doctor. If you smoke, stop. Smoking makes the problem worse. This information is not intended to replace advice given to you by your health care provider. Make sure you discuss any questions you have with your healthcare provider. Document Revised: 10/17/2020 Document Reviewed: 10/17/2020 Elsevier Patient Education  2022  [employer].    How to Use an Chiropodist An incentive spirometer is a tool that measures how well you are filling your lungs with each breath. Learning to take long, deep breaths using this tool can help you keep your lungs clear and active. This may help to reverse or lessen your chance of developing breathing (pulmonary) problems, especially infection. You may be asked to use a spirometer: After a surgery. If you have a lung problem or a history of smoking. After a long period of time when you have been unable to move or be active. If the spirometer includes an indicator to show the highest number that you have reached, your health care provider or respiratory therapist will help youset a goal. Keep a log of your progress as told by your health care provider. What are the risks? Breathing too quickly may cause dizziness or cause you to pass out. Take your time so you do not get dizzy or light-headed. If you are in pain, you may need to take pain medicine before doing incentive spirometry. It is harder to take a deep breath if you are having pain. How to use your incentive spirometer  Sit up on the edge of your bed or on a chair. Hold the incentive spirometer so that it is in an upright position. Before you  use the spirometer, breathe out normally. Place the mouthpiece in your mouth. Make sure your lips are closed tightly around it. Breathe in slowly and as deeply as you can through your mouth, causing the piston or the ball to rise toward the top of the chamber. Hold your breath for 3-5 seconds, or for as long as possible. If the spirometer includes a coach indicator, use this to guide you in breathing. Slow down your breathing if the indicator goes above the marked areas. Remove the mouthpiece from your mouth and breathe out normally. The piston or ball will return to the bottom of the chamber. Rest for a few seconds, then repeat the steps 10 or more times. Take your time and  take a few normal breaths between deep breaths so that you do not get dizzy or light-headed. Do this every 1-2 hours when you are awake. If the spirometer includes a goal marker to show the highest number you have reached (best effort), use this as a goal to work toward during each repetition. After each set of 10 deep breaths, cough a few times. This will help to make sure that your lungs are clear. If you have an incision on your chest or abdomen from surgery, place a pillow or a rolled-up towel firmly against the incision when you cough. This can help to reduce pain while taking deep breaths and coughing. General tips When you are able to get out of bed: Walk around often. Continue to take deep breaths and cough in order to clear your lungs. Keep using the incentive spirometer until your health care provider says it is okay to stop using it. If you have been in the hospital, you may be told to keep using the spirometer at home. Contact a health care provider if: You are having difficulty using the spirometer. You have trouble using the spirometer as often as instructed. Your pain medicine is not giving enough relief for you to use the spirometer as told. You have a fever. Get help right away if: You develop shortness of breath. You develop a cough with bloody mucus from the lungs. You have fluid or blood coming from an incision site after you cough. Summary An incentive spirometer is a tool that can help you learn to take long, deep breaths to keep your lungs clear and active. You may be asked to use a spirometer after a surgery, if you have a lung problem or a history of smoking, or if you have been inactive for a long period of time. Use your incentive spirometer as instructed every 1-2 hours while you are awake. If you have an incision on your chest or abdomen, place a pillow or a rolled-up towel firmly against your incision when you cough. This will help to reduce pain. Get help right  away if you have shortness of breath, you cough up bloody mucus, or blood comes from your incision when you cough. This information is not intended to replace advice given to you by your health care provider. Make sure you discuss any questions you have with your healthcare provider. Document Revised: 02/28/2020 Document Reviewed: 02/28/2020 Elsevier Patient Education  [employer].

## 2021-07-16 NOTE — Progress Notes (Signed)
$'@Patient'Q$  ID: Tony Mcmillan, male    DOB: Nov 15, 1941, 80 y.o.   MRN: [id number]  Chief Complaint  Patient presents with   COPD    Referring provider: Girtha Rm, NP-C   HPI: 80 year old male, former smoker. PMH significant for pulmonary emphysema, nocturnal hypoxia, rheumatoid arthritis. Patient of Dr. Chase Caller. ONO rechecked in May 2019 and he only spent 2.54mn <88%, nocturnal oxygen discontinued.  Continues Bevespi twice daily. HRCT in 2017 showed no evidence of ILD.   Previous [facility] pulmonary encounters: 01/11/21- Dr. RChase CallerFollow-up emphysema on nocturnal oxygen the patient with rheumatoid arthritis  Follow-up unexplained right-sided chest tightness -associated with elevation of right hemidiaphragm  HPI Tony [id number]. -returns for follow-up.  I personally last saw him in May 2019.  Then towards the end of 2020 when he saw a nurse practitioner.  He had a CT scan of the chest that shows elevated right hemidiaphragm but no fibrosis.  He has emphysema.  He continues to feel stable.  He still has an unexplained right-sided chest tightness.  He uses a cane.  He drives around.  His COPD CAT score is 9 but he wants to improve his symptoms further.  Filled out DME form at his request.  CAT Score 01/11/2021 11/20/2020 08/03/2019  Total CAT Score '9 11 5    '$ 07/16/2021- Interim hx  Patient presents today for 6 month follow-up emphysema, hx RA. During his last visit he was felt to be clinically stable, he was given sample of Breztri. He is doing fairly well today. He reports some occasional chest tightness and wheezing. He does not currently have an active cough. He stopped using Breztri after reading drug information, he prefers [place] feels this works well for him. He will need a prescription as he has ran out of medication. He uses his Albuterol rescue inhaler 1-2 times a month. HRCT in January 2022 showed no evidence of ILD, scarring/atelectasis right lung base and emphysema. He is  on Remicade and Arava for his rheumatoid arthritis.  He uses a cane to walk. He does his own shopping. No falls.   CAT score 8   01/01/21 CT chest IMPRESSION: 1. No evidence of fibrotic interstitial lung disease. 2. Elevation of the right hemidiaphragm with associated scarring and or atelectasis of the right lung base. 3. Emphysema. 4. Coronary artery disease.   Aortic Atherosclerosis (ICD10-I70.0) and Emphysema (ICD10-J43.9).     Electronically Signed   By: AEddie CandleM.D.   On: 01/01/2021 10:24  PFT  PFT Results Latest Ref Rng & Units 11/20/2020 11/26/2016  FVC-Pre L 1.91 1.79  FVC-Predicted Pre % 71 65  FVC-Post L - 1.81  FVC-Predicted Post % - 65  Pre FEV1/FVC % % 81 81  Post FEV1/FCV % % - 82  FEV1-Pre L 1.55 1.46  FEV1-Predicted Pre % 80 73  FEV1-Post L - 1.48  DLCO uncorrected ml/min/mmHg 11.83 12.16  DLCO UNC% % 60 53  DLCO corrected ml/min/mmHg 12.43 12.31  DLCO COR %Predicted % 63 53  DLVA Predicted % 99 98  TLC L - 3.64  TLC % Predicted % - 64  RV % Predicted % - 86       No Known Allergies  Immunization History  Administered Date(s) Administered   Influenza, High Dose Seasonal PF 07/23/2017, 08/05/2019, 08/30/2020   Influenza,inj,Quad PF,6+ Mos 08/01/2017   Influenza-Unspecified 10/30/2015, 09/13/2016, 07/30/2018, 08/05/2019   PFIZER(Purple Top)SARS-COV-2 Vaccination 01/28/2020, 02/18/2020, 09/19/2020   Pneumococcal Conjugate-13 12/05/2015  Pneumococcal Polysaccharide-23 04/23/2017   Tdap 12/09/2015   Zoster, Live 12/09/2015    Past Medical History:  Diagnosis Date   3-vessel coronary artery disease 03/10/2020   Allergy    Anemia    Aortic valve calcification 03/10/2020   Arthritis    Asymptomatic microscopic hematuria 04/03/2016   Cystoscopy 02/207 Dr. Pilar [facility]vis [facility].     Atypical chest pain    a. 09/2016 MV: EF 63%, normal perfusion.   Benign prostatic hyperplasia 04/03/2016   Asymptomatic. Normal PSA and exam by Dr.  Pilar Jarvis [facility] 01/2016.     Bilateral carotid bruits 02/09/2019   Bladder wall thickening 03/10/2020   CAD (coronary artery disease)    Coronary artery calcification on Chest CT in 09/2016 and 01/2019 // Myoview 10/17: normal perfusion // Myoview 01/2019: no ischemia or infarction, EF 58 // Coronary CTA 03/2020: Calcium Score 1067; non-obstructive CAD [oLM 1-24, LAD ost 25049, mid and dist 1-24; D1 1-24; LCx prox and mid 1-24, OM1 and OM2 25-49, RCA and PLB 1-24; PLA 1-24]    Carotid bruit    Carotid Dopplers 01/2019: no ICA stenosis bilaterally    Colonic polyp 02/23/2016   COVID-19    DDD (degenerative disc disease), 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 L4-5 and [id number]    Diastolic dysfunction    a. 2015 Echo: EF >55%;  b. 09/2016 Echo: EF 55-60%, no rwma, Gr1 DD, triv AI/MR, mild TR, PASP 88mHg.   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   On CT 01/2016    Palpitations    Echo 10/17: Mild focal basal septal hypertrophy, EF 55-60, no RWMA, Gr 1 DD, trivial MR, normal RVSF, mild TR, PASP 28 // Event monitor 01/2019: NSR, PACs   Prediabetes 10/29/2018   Pulmonary emphysema ([place] 12/18/2016   Renal cyst, left 04/03/2016   On CT, benign    [place]heumatoid arthritis ([place]    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[place] 11/04/2016   -RF,-CCP,-[id number], erosive disease with contractures   Tubular adenoma of colon 01/2016   Vitamin D deficiency 11/04/2016    Tobacco History: Social History   Tobacco Use  Smoking Status Former   Packs/day: 1.00   Years: 17.00   Pack years: 17.00   Types: Cigarettes   Quit date: 12/23/1993   Years since quitting: 27.5   Smokeless Tobacco Never   Counseling given: Not Answered   Outpatient Medications Prior to Visit  Medication Sig Dispense Refill   albuterol (VENTOLIN HFA) 108 (90 Base) MCG/ACT inhaler Inhale 2 puffs into the lungs every 6 (six) hours as needed for wheezing or shortness of breath. 8 g 6   Ascorbic Acid (VITAMIN C PO) Take 1 tablet by mouth daily.     aspirin EC 81 MG tablet Take 1 tablet (81 mg total) by mouth daily. 90 tablet 3   atorvastatin (LIPITOR) 40 MG tablet TAKE 1 TABLET BY MOUTH EVERY DAY 90 tablet 2   Calcium Carb-Cholecalciferol (CALCIUM-VITAMIN D3) 600-400 MG-UNIT TABS Take 1 tablet by mouth 2 (two) times daily.     inFLIXimab (REMICADE IV) Inject into the vein.     leflunomide (ARAVA) 20 MG tablet 1 tablet  metoprolol succinate (TOPROL XL) 25 MG 24 hr tablet Take 1 tablet (25 mg total) by mouth at bedtime. 90 tablet 1   MYRBETRIQ 50 MG TB24 tablet Take 50 mg by mouth daily.      nitroGLYCERIN (NITROSTAT) 0.4 MG SL tablet Place 1 tablet (0.4 mg total) under the tongue every 5 (five) minutes as needed for chest pain. 25 tablet 6   pantoprazole (PROTONIX) 40 MG tablet TAKE 1 TABLET BY MOUTH EVERY DAY 90 tablet 0   Glycopyrrolate-Formoterol (BEVESPI IN) Inhale 2 puffs into the lungs in the morning and at bedtime.     Budeson-Glycopyrrol-Formoterol (BREZTRI AEROSPHERE) 160-9-4.8 MCG/ACT AERO Inhale 2 puffs into the lungs in the morning and at bedtime. (Patient not taking: Reported on 07/16/2021) 5.9 g 0   No facility-administered medications prior to visit.    Review of Systems  Review of Systems  Constitutional: Negative.   HENT: Negative.    Respiratory:  Positive for chest tightness and wheezing. Negative for cough and shortness of breath.   Cardiovascular: Negative.     Physical Exam  BP 112/68   Pulse 77   Ht '5\' 2"'$  (1.575 m)   Wt 136 lb (61.7 kg)   SpO2 97%   BMI 24.87 kg/m  Physical Exam Constitutional:      General: He is not in acute distress.     Appearance: Normal appearance. He is not ill-appearing or toxic-appearing.  HENT:     Head: Normocephalic and atraumatic.     Mouth/Throat:     Mouth: Mucous membranes are moist.     Pharynx: Oropharynx is clear.  Cardiovascular:     Rate and Rhythm: Normal rate and regular rhythm.  Pulmonary:     Effort: Pulmonary effort is normal.     Breath sounds: Normal breath sounds. No wheezing, rhonchi or rales.     Comments: CTA, diminished right base  Musculoskeletal:     Comments: Steady gait, uses cane just for balance   Skin:    General: Skin is warm and dry.  Neurological:     General: No focal deficit present.     Mental Status: He is alert and oriented to person, place, and time. Mental status is at baseline.  Psychiatric:        Mood and Affect: Mood normal.        Behavior: Behavior normal.        Thought Content: Thought content normal.        Judgment: Judgment normal.     Lab Results:  CBC    Component Value Date/Time   WBC 5.4 12/27/2020 0000   RBC 4.56 12/27/2020 0000   HGB 13.6 12/27/2020 0000   HGB 13.0 10/09/2020 1133   HCT 39.7 12/27/2020 0000   HCT 38.5 10/09/2020 1133   PLT 309 12/27/2020 0000   PLT 345 10/09/2020 1133   MCV 87.1 12/27/2020 0000   MCV 87 10/09/2020 1133   MCH 29.8 12/27/2020 0000   MCHC 34.3 12/27/2020 0000   RDW 14.9 12/27/2020 0000   RDW 14.6 10/09/2020 1133   LYMPHSABS 518 (L) 12/27/2020 0000   LYMPHSABS 0.8 10/09/2020 1133   MONOABS 392 07/14/2017 1123   EOSABS 22 12/27/2020 0000   EOSABS 0.0 10/09/2020 1133   BASOSABS 11 12/27/2020 0000   BASOSABS 0.0 10/09/2020 1133    BMET    Component Value Date/Time   NA 135 12/27/2020 0000   NA 138 10/09/2020 1133   K 5.1 12/27/2020 0000   CL  101 12/27/2020 0000   CO2 25 12/27/2020 0000   GLUCOSE 90 12/27/2020 0000   BUN 15 12/27/2020 0000   BUN 18 10/09/2020 1133   CREATININE 1.43 (H) 12/27/2020 0000   CALCIUM 9.5 12/27/2020 0000   GFRNONAA 47 (L) 12/27/2020 0000   GFRAA 54  (L) 12/27/2020 0000    BNP No results found for: BNP  ProBNP No results found for: PROBNP  Imaging: No results found.   Assessment & Plan:   Rheumatoid arthritis involving multiple sites [facility]) - No evidence of ILD on HRCT imaging in January 2022  - Currently on Remicade and Arava for RA - Following with rheumatology   Pulmonary emphysema ([place]) Stable interval. Patient experiences occasional chest tightness and wheezing. He is not currently on any maintenance inhalers. Rare SABA use 1-2 times a month. He prefers Agricultural consultant. No recent exacerbations requiring abx or oral steroids. CAT score 8. Advised patient to resume Bevespi (LABA/LAMA) 2 puffs twice daily and start using incentive spirometer daily.   Plan:  Resume Bevespi two puffs morning and evening Use incentive spirometer every hour while awake to encourage deep breathing   Orders: Incentive spirometer  Follow-up: 6 month follow-up with Dr. Chase Caller or sooner if needed      Martyn Ehrich, NP 07/16/2021

## 2021-07-18 ENCOUNTER — Telehealth: Payer: Self-pay | Admitting: Primary Care

## 2021-07-18 MED ORDER — BEVESPI AEROSPHERE 9-4.8 MCG/ACT IN AERO: 2.0000 | INHALATION_SPRAY | Freq: Two times a day (BID) | RESPIRATORY_TRACT | 3 refills | Status: DC

## 2021-07-18 NOTE — Telephone Encounter (Signed)
Lm for patient.  

## 2021-07-18 NOTE — Telephone Encounter (Signed)
90 day supply of Bevespi has been sent to preferred pharmacy. Left detailed message for patient.  Nothing further needed at this time.

## 2021-07-18 NOTE — Telephone Encounter (Signed)
Spoke to patient, who states that Tony Mcmillan is not covered by insurance. I have recommended that he contact insurance and request a list of covered alternatives.  He will call back with update.  Nothing further needed at this time.

## 2021-08-09 ENCOUNTER — Other Ambulatory Visit: Payer: Self-pay | Admitting: Gastroenterology

## 2021-08-27 ENCOUNTER — Other Ambulatory Visit: Payer: Self-pay | Admitting: Physician Assistant

## 2021-09-03 ENCOUNTER — Telehealth: Payer: Self-pay | Admitting: Physician Assistant

## 2021-09-03 MED ORDER — ISOSORBIDE MONONITRATE ER 30 MG PO TB24: 15.0000 mg | ORAL_TABLET | Freq: Every day | ORAL | 3 refills | Status: DC

## 2021-09-03 NOTE — Telephone Encounter (Signed)
Pt c/o of Chest Pain: STAT if CP now or developed within 24 hours  1. Are you having CP right now? Not at this minutes, but had some this morning  2. Are you experiencing any other symptoms (ex. SOB, nausea, vomiting, sweating)?  Short of breath until he took the nitroglycerin ,also felt warm  3. How long have you been experiencing CP? yesterday  4. Is your CP continuous or coming and going? Comes and goes  5. Have you taken Nitroglycerin? yes ?

## 2021-09-03 NOTE — Telephone Encounter (Signed)
Spoke with the patient and he is willing to try Imdur 15 mg daily.  I have scheduled him for a follow up with Richardson Dopp, PA-C on 10/05.

## 2021-09-03 NOTE — Telephone Encounter (Signed)
Spoke with the patient who states that yesterday he woke up with some chest tightness. Patient states that he took a nitroglycerin and pain was relieved. He reports he did become hot and had some shortness of breath. He states that this morning he woke up again with some chest tightness. No other symptoms. He did take a nitroglycerin this morning. He is no longer having any chest pain. He states that he is not lightheaded or dizzy. Blood pressure is 137/77. He states that he thinks that the pain is similar to episodes he has had in the past. Stress test in 02/2021 was normal. Patient advised that if pain returns with associated symptoms and no relief after nitro use that he needs to call 911. Patient verbalized understanding.  Patient is due to see Richardson Dopp, PA-C in October but does not have an appointment scheduled. Will see if I can find him a sooner appointment and route to primary cardiologist for further advisement.

## 2021-09-15 ENCOUNTER — Other Ambulatory Visit: Payer: Self-pay | Admitting: Physician Assistant

## 2021-09-26 ENCOUNTER — Encounter: Payer: Self-pay | Admitting: Physician Assistant

## 2021-09-26 ENCOUNTER — Ambulatory Visit (INDEPENDENT_AMBULATORY_CARE_PROVIDER_SITE_OTHER): Payer: Medicare HMO | Admitting: Physician Assistant

## 2021-09-26 ENCOUNTER — Other Ambulatory Visit: Payer: Self-pay

## 2021-09-26 ENCOUNTER — Ambulatory Visit
Admission: RE | Admit: 2021-09-26 | Discharge: 2021-09-26 | Disposition: A | Discharge status: Home / Self Care | Payer: Medicare HMO | Source: Ambulatory Visit | Attending: Physician Assistant | Admitting: Physician Assistant

## 2021-09-26 ENCOUNTER — Telehealth: Payer: Self-pay | Admitting: *Deleted

## 2021-09-26 VITALS — BP 102/50 | HR 100 | Ht 62.0 in | Wt 134.6 lb

## 2021-09-26 DIAGNOSIS — E782 Mixed hyperlipidemia: Secondary | ICD-10-CM

## 2021-09-26 DIAGNOSIS — R059 Cough, unspecified: Secondary | ICD-10-CM

## 2021-09-26 DIAGNOSIS — I25119 Atherosclerotic heart disease of native coronary artery with unspecified angina pectoris: Secondary | ICD-10-CM

## 2021-09-26 DIAGNOSIS — J441 Chronic obstructive pulmonary disease with (acute) exacerbation: Secondary | ICD-10-CM | POA: Diagnosis not present

## 2021-09-26 MED ORDER — AZITHROMYCIN 250 MG PO TABS: ORAL_TABLET | ORAL | 0 refills | Status: DC

## 2021-09-26 NOTE — Telephone Encounter (Signed)
S/w Chantel at Dr. Golden Pop office to schedule appointment for pt today or tomorrow for COPD exacerbation. Pt is seeing Rexene Edison, NP tomorrow at 4.

## 2021-09-26 NOTE — Patient Instructions (Signed)
Medication Instructions:   START Z-Pak two tablets by mouth ( 500 mg ) today.  Than take one tablet by mouth (250 mg) everyday till gone.   *If you need a refill on your cardiac medications before your next appointment, please call your pharmacy*   Lab Work: TODAY!!!!!! BMET/PRO BNP/CBC W DIFF  If you have labs (blood work) drawn today and your tests are completely normal, you will receive your results only by: [place] (if you have MyChart) OR A paper copy in the mail If you have any lab test that is abnormal or we need to change your treatment, we will call you to review the results.   Testing/Procedures: A chest x-ray takes a picture of the organs and structures inside the chest, including the heart, lungs, and blood vessels. This test can show several things, including, whether the heart is enlarges; whether fluid is building up in the lungs; and whether pacemaker / defibrillator leads are still in place. [place].  Go out of parking lot make left, second stop light [place] make right, [address] on [address] [place] make left, go to front of building and walk in to [place].     Follow-Up: At [facility], you and your health needs are our priority.  As part of our continuing mission to provide you with exceptional heart care, we have created designated Provider Care Teams.  These Care Teams include your primary Cardiologist (physician) and Advanced Practice Providers (APPs -  Physician Assistants and Nurse Practitioners) who all work together to provide you with the care you need, when you need it.  We recommend signing up for the patient portal called "MyChart".  Sign up information is provided on this After Visit Summary.  MyChart is used to connect with patients for Virtual Visits (Telemedicine).  Patients are able to view lab/test results, encounter notes, upcoming appointments, etc.  Non-urgent messages can be sent to your provider as well.   To  learn more about what you can do with MyChart, go to [email or web address].    Your next appointment:   2 week(s)  The format for your next appointment:   In Person  Provider:   Richardson Dopp, PA-C   Other Instructions  GO TO ED IF ANY SYMPTOMS GET WORSE!!!!!!    [place].

## 2021-09-26 NOTE — Progress Notes (Addendum)
Cardiology Office Note:    Date:  09/26/2021   ID:  Tony Mcmillan, DOB Apr 18, 1941, MRN [id number]  PCP:  Girtha Rm, NP-C   [facility] Providers Cardiologist:  Freada Bergeron, MD Cardiology APP:  [facility]     Referring MD: Girtha Rm, NP-C   Chief Complaint:  F/u for CAD, chest pain    Patient Profile:   Tony Mcmillan is a 80 y.o. male with:  Coronary artery disease, non-obstructive  Coronary artery calcification, aortic atherosclerosis on Chest CT 11/2016, 02/2020 Cor CTA 03/2020: Ca score 1067; LM 1-24, oLAD 25-49, m+dLAD 1-24, D1 1-24; p+mLCx 1-24; OM1 and OM2 24-59; RCA and PLB 1-24; PLA 1-24 Emphysema Chest pain  Myoview 09/2016: normal perfusion Myoview 01/2019: no ischemia or scar, EF 58 CT 4/21: non-obs CAD Myoview 3/22: Low risk Rx: Toprol XL, Imdur GERD Rheumatoid Arthritis  PACs   Prior CV studies: Myoview 03/01/21 EF 72, inf artifact, normal perfusion, low risk    Coronary CTA 03/2020:  Calcium Score 1067; non-obstructive CAD [oLM 1-24, LAD ost 25-49, mid and dist 1-24; D1 1-24; LCx prox and mid 1-24, OM1 and OM2 25-49, RCA and PLB 1-24; PLA 1-24]    Event Monitor 02/16/2019 NSR, PACs   Myoview 02/16/2019 EF 58, no ischemia or infarction   Carotid US 02/15/2019 No ICA stenosis    Myoview 10/14/16 EF 63, normal perfusion; Low Risk   Echocardiogram 10/14/16 Mild focal basal septal hypertrophy, EF 55-60, no RWMA, Gr 1 DD, trivial MR, normal RVSF, mild TR, PASP 28   History of Present Illness: Tony Mcmillan was last seen in 4/22.  He continued to have L sided chest pain.  I put him on Metoprolol succinate.  He called in recently with recurrent chest pain and Dr. Angelena Form recommended Isosorbide.  He returns for f/u.  He is here alone.  He notes L sided sharp chest pain over the past 2 weeks.  It is not related to exertion.  He does take NTG with relief.  He notes chronic dyspnea on exertion and this seems to be worse.  He notes recent  congestion and cough.  He is coughing up more sputum than usual.  No hemoptysis.  He has not had orthopnea, leg edema, syncope.  He sees his PCP tomorrow.      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 Pilar Jarvis [facility].     Atypical chest pain    a. 09/2016 MV: EF 63%, normal perfusion.   Benign prostatic hyperplasia 04/03/2016   Asymptomatic. Normal PSA and exam by Dr. Pilar Jarvis [facility] 01/2016.     Bilateral carotid bruits 02/09/2019   Bladder wall thickening 03/10/2020   CAD (coronary artery disease)    Coronary artery calcification on Chest CT in 09/2016 and 01/2019 // Myoview 10/17: normal perfusion // Myoview 01/2019: no ischemia or infarction, EF 58 // Coronary CTA 03/2020: Calcium Score 1067; non-obstructive CAD [oLM 1-24, LAD ost 25049, mid and dist 1-24; D1 1-24; LCx prox and mid 1-24, OM1 and OM2 25-49, RCA and PLB 1-24; PLA 1-24]    Carotid bruit    Carotid Dopplers 01/2019: no ICA stenosis bilaterally    Colonic polyp 02/23/2016   COVID-19    DDD (degenerative disc disease), lumbar 04/03/2016   On  CT 01/2016. L4-5 and F6-C1    Diastolic dysfunction    a. 2015 Echo: EF >55%;  b. 09/2016 Echo: EF 55-60%, no rwma, Gr1 DD, triv AI/MR, mild TR, PASP 32mmHg.   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   On CT 01/2016    Palpitations    Echo 10/17: Mild focal basal septal hypertrophy, EF 55-60, no RWMA, Gr 1 DD, trivial MR, normal RVSF, mild TR, PASP 28 //  Event monitor 01/2019: NSR, PACs   Prediabetes 10/29/2018   Pulmonary emphysema ([place]) 12/18/2016   Renal cyst, left 04/03/2016   On CT, benign    Rheumatoid arthritis ([place])    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   Ascorbic Acid (VITAMIN C PO) Take 1 tablet by mouth daily.   aspirin EC 81 MG tablet Take 1 tablet (81 mg total) by mouth daily.   atorvastatin (LIPITOR) 40 MG tablet TAKE 1 TABLET BY MOUTH EVERY DAY   azithromycin (ZITHROMAX) 250 MG tablet Take 2 pills today than 1 pill everyday X 4 days till all.   Calcium Carb-Cholecalciferol (CALCIUM-VITAMIN D3) 600-400 MG-UNIT TABS Take 1 tablet by mouth 2 (two) times daily.   Glycopyrrolate-Formoterol (BEVESPI AEROSPHERE) 9-4.8 MCG/ACT AERO Inhale 2 puffs into the lungs in the morning and at bedtime.   inFLIXimab (REMICADE IV) Inject 100 mg into the vein as needed (for arthrithis).   isosorbide mononitrate (IMDUR) 30 MG 24 hr tablet Take 0.5 tablets (15 mg total) by mouth daily.   leflunomide (ARAVA) 20 MG tablet 1 tablet   metoprolol succinate (TOPROL-XL) 25 MG 24 hr tablet TAKE 1 TABLET BY MOUTH EVERYDAY AT BEDTIME   MYRBETRIQ 50 MG TB24 tablet Take 50 mg by mouth daily.    nitroGLYCERIN (NITROSTAT) 0.4 MG SL tablet PLACE 1 TABLET UNDER THE TONGUE EVERY 5 MINUTES AS NEEDED FOR CHEST PAIN.   pantoprazole (PROTONIX) 40 MG tablet TAKE 1 TABLET BY MOUTH EVERY DAY    Allergies:   Patient has no known allergies.   Social History   Tobacco Use   Smoking status: Former    Packs/day: 1.00    Years: 17.00    Pack years: 17.00    Types: Cigarettes    Quit date: 12/23/1993    Years since  quitting: 27.7   Smokeless tobacco: Never  Vaping Use   Vaping Use: Never used  Substance Use Topics   Alcohol use: No    Alcohol/week: 0.0 standard drinks   Drug use: No    Family Hx: The patient's family history includes Diabetes in his brother, father, and sister; Heart disease in his brother and father; Hyperlipidemia in his sister; Hypertension in his father and mother; Stroke in his brother. There is no history of Colon polyps, Esophageal cancer, Stomach cancer, Rectal cancer, or Colon cancer.  Review of Systems  Constitutional: Negative for  fever.  HENT:  Positive for congestion.   Respiratory:  Positive for cough. Negative for hemoptysis.   Gastrointestinal:  Negative for diarrhea, hematochezia and vomiting.  Genitourinary:  Negative for hematuria.    EKGs/Labs/Other Test Reviewed:    EKG:  EKG is   ordered today.  The ekg ordered today demonstrates sinus tachycardia, HR 108, normal axis, septal Qs, no ST-TW changes, QTc 426 ms, no change from prior tracing.   Recent Labs: 12/27/2020: ALT 23; BUN 15; Creat 1.43; Hemoglobin 13.6; Platelets 309; Potassium 5.1; Sodium 135   Recent Lipid Panel Lab Results  Component Value Date/Time   CHOL 144 02/13/2021 09:04 AM   TRIG 121 02/13/2021 09:04 AM   HDL 63 02/13/2021 09:04 AM   LDLCALC 60 02/13/2021 09:04 AM   LDLCALC 98 09/23/2019 01:30 PM     Risk Assessment/Calculations:          Physical Exam:    VS:  BP (!) 102/50   Pulse 100   Ht 5\' 2"  (1.575 m)   Wt 134 lb 9.6 oz (61.1 kg)   SpO2 96%   BMI 24.62 kg/m     Wt Readings from Last 3 Encounters:  09/26/21 134 lb 9.6 oz (61.1 kg)  07/16/21 136 lb (61.7 kg)  07/09/21 138 lb (62.6 kg)    Constitutional:      Appearance: Healthy appearance. Not in distress.  Neck:     Vascular: No JVR. JVD normal.  Pulmonary:     Effort: Pulmonary effort is normal.     Breath sounds: Wheezing (diffuse ins early wheezes) present. Rales (Exp L base) present.  Cardiovascular:      Tachycardia present. Regular rhythm. Normal S1. Normal S2.      Murmurs: There is no murmur.  Edema:    Peripheral edema absent.  Abdominal:     Palpations: Abdomen is soft.  Skin:    General: Skin is warm and dry.  Neurological:     General: No focal deficit present.     Mental Status: Alert and oriented to person, place and time.     Cranial Nerves: Cranial nerves are intact.     ASSESSMENT & PLAN:   1. Chronic obstructive pulmonary disease with acute exacerbation ([place]) I suspect his left-sided chest discomfort is related to his ongoing lung infection.  He has increased shortness of breath, congestion and productive cough.  He is tachycardic.  His O2 sats are normal on room air.  At this time, he seems appropriate for outpatient management.  I will send him for a chest x-ray.  I will obtain a BMET, BNP, CBC with differential.  He has an appointment with primary care tomorrow.  I have also arranged for him to see pulmonology tomorrow afternoon.  He knows to go the emergency room if any of his symptoms should seem worse.  I will start him on azithromycin 500 mg x 1, then 250 mg daily x4.  If his chest x-ray demonstrates pneumonia, I will add Augmentin to his medical regimen.  Further management per pulmonology tomorrow.  2. Coronary artery disease involving native coronary artery of native heart with angina pectoris ([place]) Coronary CTA in 4/21 demonstrated mild to moderate nonobstructive CAD.  Myoview in 3/22 was low risk.  EKG today does demonstrate tachycardia but no ST-T wave changes.  Overall, his symptoms seem to be noncardiac.  It is not clear why nitroglycerin improves his symptoms at this time.  I suspect all of his chest symptoms are related  to his ongoing lung infection.  If he continues to have chest discomfort after his infection has resolved, we will likely need to consider further evaluation.  If his BNP is elevated, I will arrange for an echocardiogram.  As noted above, he knows to  go to the emergency room if any of his symptoms should worsen.  I will see him back in 2 weeks.  3. Mixed hyperlipidemia Continue atorvastatin 40 mg daily.  LDL in 01/2021 was optimal.   Dispo:  Return in about 2 weeks (around 10/10/2021) for Close Follow Up, w/ Richardson Dopp, PA-C.  He was originally assigned to Dr. Johnsie Cancel but has never seen him.  I will follow him going forward along with Dr. Johney Frame.   Medication Adjustments/Labs and Tests Ordered: Current medicines are reviewed at length with the patient today.  Concerns regarding medicines are outlined above.  Tests Ordered: Orders Placed This Encounter  Procedures   DG Chest 2 View   Basic Metabolic Panel (BMET)   Pro b natriuretic peptide   CBC w/Diff   EKG 12-Lead    Medication Changes: Meds ordered this encounter  Medications   azithromycin (ZITHROMAX) 250 MG tablet    Sig: Take 2 pills today than 1 pill everyday X 4 days till all.    Dispense:  6 each    Refill:  0    Signed, Richardson Dopp, PA-C  09/26/2021 5:45 PM    [facility] [address], [place], [place]  [address] Phone: [phone]; Fax: [phone]

## 2021-09-26 NOTE — Progress Notes (Deleted)
Tony Mcmillan is a 80 y.o. male who presents for annual wellness visit and follow-up on chronic medical conditions.  He has the following concerns:   Immunization History  Administered Date(s) Administered   Influenza, High Dose Seasonal PF 07/23/2017, 08/05/2019, 08/30/2020   Influenza,inj,Quad PF,6+ Mos 08/01/2017   Influenza-Unspecified 10/30/2015, 09/13/2016, 07/30/2018, 08/05/2019   PFIZER(Purple Top)SARS-COV-2 Vaccination 01/28/2020, 02/18/2020, 09/19/2020   Pneumococcal Conjugate-13 12/05/2015   Pneumococcal Polysaccharide-23 04/23/2017   Tdap 12/09/2015   Zoster, Live 12/09/2015   Last colonoscopy: Last PSA: Dentist: Ophtho: Exercise:  Other doctors caring for patient include:   Depression screen:  See questionnaire below.     Depression screen [facility] 2/9 04/09/2021 09/06/2020 04/07/2020 08/03/2018 06/12/2018  Decreased Interest 0 0 0 0 0  Down, Depressed, Hopeless 0 0 0 0 0  PHQ - 2 Score 0 0 0 0 0  Some recent data might be hidden    Fall Screen: See Questionaire below.   Fall Risk  04/09/2021 09/06/2020 04/07/2020 11/16/2019 08/03/2018  Falls in the past year? 0 0 0 0 No  Comment - - - Emmi Telephone Survey: data to providers prior to load -  Number falls in past yr: 0 0 0 - -  Injury with Fall? 0 0 - - -  Risk for fall due to : No Fall Risks - - - -  Risk for fall due to: Comment - - - - -  Follow up Falls evaluation completed - - - -    ADL screen:  See questionnaire below.  Functional Status Survey:     End of Life Discussion:  Patient {ACTIONS; HAS/DOES NOT HAVE:19233} a living will and medical power of attorney   Review of Systems  Constitutional: -fever, -chills, -sweats, -unexpected weight change, -anorexia, -fatigue Allergy: -sneezing, -itching, -congestion Dermatology: denies changing moles, rash, lumps, new worrisome lesions ENT: -runny nose, -ear pain, -sore throat, -hoarseness, -sinus pain, -teeth pain, -tinnitus, -hearing loss,  -epistaxis Cardiology:  -chest pain, -palpitations, -edema, -orthopnea, -paroxysmal nocturnal dyspnea Respiratory: -cough, -shortness of breath, -dyspnea on exertion, -wheezing, -hemoptysis Gastroenterology: -abdominal pain, -nausea, -vomiting, -diarrhea, -constipation, -blood in stool, -changes in bowel movement, -dysphagia Hematology: -bleeding or bruising problems Musculoskeletal: -arthralgias, -myalgias, -joint swelling, -back pain, -neck pain, -cramping, -gait changes Ophthalmology: -vision changes, -eye redness, -itching, -discharge Urology: -dysuria, -difficulty urinating, -hematuria, -urinary frequency, -urgency, incontinence Neurology: -headache, -weakness, -tingling, -numbness, -speech abnormality, -memory loss, -falls, -dizziness Psychology:  -depressed mood, -agitation, -sleep problems   PHYSICAL EXAM:  There were no vitals taken for this visit.  General Appearance: Alert, cooperative, no distress, appears stated age Head: Normocephalic, without obvious abnormality, atraumatic Eyes: PERRL, conjunctiva/corneas clear, EOM's intact, fundi benign Ears: Normal TM's and external ear canals Nose: Nares normal, mucosa normal, no drainage or sinus   tenderness Throat: Lips, mucosa, and tongue normal; teeth and gums normal Neck: Supple, no lymphadenopathy, thyroid:no enlargement/tenderness/nodules; no carotid bruit or JVD Back: Spine nontender, no curvature, ROM normal, no CVA tenderness Lungs: Clear to auscultation bilaterally without wheezes, rales or ronchi; respirations unlabored Chest Wall: No tenderness or deformity Heart: Regular rate and rhythm, S1 and S2 normal, no murmur, rub or gallop Breast Exam: No chest wall tenderness, masses or gynecomastia Abdomen: Soft, non-tender, nondistended, normoactive bowel sounds, no masses, no hepatosplenomegaly Genitalia: Normal male external genitalia without lesions.  Testicles without masses.  No inguinal hernias. Rectal: Normal sphincter  tone, no masses or tenderness; guaiac negative stool.  Prostate smooth, no nodules, not enlarged. Extremities: No clubbing, cyanosis or edema Pulses:  2+ and symmetric all extremities Skin: Skin color, texture, turgor normal, no rashes or lesions Lymph nodes: Cervical, supraclavicular, and axillary nodes normal Neurologic: CNII-XII intact, normal strength, sensation and gait; reflexes 2+ and symmetric throughout   Psych: Normal mood, affect, hygiene and grooming  ASSESSMENT/PLAN: Medicare annual wellness visit, subsequent    Discussed PSA screening (risks/benefits), recommended at least 30 minutes of aerobic activity at least 5 days/week; proper sunscreen use reviewed; healthy diet and alcohol recommendations (less than or equal to 2 drinks/day) reviewed; regular seatbelt use; changing batteries in smoke detectors. Immunization recommendations discussed.  Colonoscopy recommendations reviewed.   Medicare Attestation I have personally reviewed: The patient's medical and social history Their use of alcohol, tobacco or illicit drugs Their current medications and supplements The patient's functional ability including ADLs,fall risks, home safety risks, cognitive, and hearing and visual impairment Diet and physical activities Evidence for depression or mood disorders  The patient's weight, height, and BMI have been recorded in the chart.  I have made referrals, counseling, and provided education to the patient based on review of the above and I have provided the patient with a written personalized care plan for preventive services.     Harland Dingwall, NP-C   09/26/2021

## 2021-09-27 ENCOUNTER — Ambulatory Visit: Payer: Medicare HMO | Admitting: Family Medicine

## 2021-09-27 ENCOUNTER — Encounter: Payer: Self-pay | Admitting: Adult Health

## 2021-09-27 ENCOUNTER — Ambulatory Visit (INDEPENDENT_AMBULATORY_CARE_PROVIDER_SITE_OTHER): Payer: Medicare HMO | Admitting: Adult Health

## 2021-09-27 DIAGNOSIS — J441 Chronic obstructive pulmonary disease with (acute) exacerbation: Secondary | ICD-10-CM | POA: Diagnosis not present

## 2021-09-27 DIAGNOSIS — Z0000 Encounter for general adult medical examination without abnormal findings: Secondary | ICD-10-CM

## 2021-09-27 LAB — CBC WITH DIFFERENTIAL/PLATELET
Basophils Absolute: 0 10*3/uL (ref 0.0–0.2)
Basos: 1 %
EOS (ABSOLUTE): 0.1 10*3/uL (ref 0.0–0.4)
Eos: 1 %
Hematocrit: 33.3 % — ABNORMAL LOW (ref 37.5–51.0)
Hemoglobin: 11 g/dL — ABNORMAL LOW (ref 13.0–17.7)
Immature Grans (Abs): 0 10*3/uL (ref 0.0–0.1)
Immature Granulocytes: 0 %
Lymphocytes Absolute: 0.5 10*3/uL — ABNORMAL LOW (ref 0.7–3.1)
Lymphs: 11 %
MCH: 25.9 pg — ABNORMAL LOW (ref 26.6–33.0)
MCHC: 33 g/dL (ref 31.5–35.7)
MCV: 78 fL — ABNORMAL LOW (ref 79–97)
Monocytes Absolute: 0.8 10*3/uL (ref 0.1–0.9)
Monocytes: 17 %
Neutrophils Absolute: 3.2 10*3/uL (ref 1.4–7.0)
Neutrophils: 70 %
Platelets: 273 10*3/uL (ref 150–450)
RBC: 4.25 x10E6/uL (ref 4.14–5.80)
RDW: 15.7 % — ABNORMAL HIGH (ref 11.6–15.4)
WBC: 4.5 10*3/uL (ref 3.4–10.8)

## 2021-09-27 LAB — BASIC METABOLIC PANEL
BUN/Creatinine Ratio: 9 — ABNORMAL LOW (ref 10–24)
BUN: 11 mg/dL (ref 8–27)
CO2: 18 mmol/L — ABNORMAL LOW (ref 20–29)
Calcium: 8.8 mg/dL (ref 8.6–10.2)
Chloride: 99 mmol/L (ref 96–106)
Creatinine, Ser: 1.16 mg/dL (ref 0.76–1.27)
Glucose: 100 mg/dL — ABNORMAL HIGH (ref 70–99)
Potassium: 4.3 mmol/L (ref 3.5–5.2)
Sodium: 135 mmol/L (ref 134–144)
eGFR: 64 mL/min/{1.73_m2} (ref >59)

## 2021-09-27 LAB — PRO B NATRIURETIC PEPTIDE: NT-Pro BNP: 66 pg/mL (ref 0–486)

## 2021-09-27 MED ORDER — PREDNISONE 10 MG PO TABS: ORAL_TABLET | ORAL | 0 refills | Status: DC

## 2021-09-27 NOTE — Patient Instructions (Signed)
Finish Zpack as directed  Mucinex DM Twice daily  As needed  cough/congestion  Prednisone taper over next week  Continue on BEVESPI 2 puffs Twice daily Albuterol inhaler As needed   Follow up with Dr. Chase Caller in 6-8 weeks and As needed   Please contact office for sooner follow up if symptoms do not improve or worsen or seek emergency care

## 2021-09-27 NOTE — Progress Notes (Signed)
@Patient  ID: Tony Mcmillan, male    DOB: 04-Dec-1941, 80 y.o.   MRN: [id number]  Chief Complaint  Patient presents with   Follow-up    Referring provider: Girtha Rm, NP-C  HPI: 80 year old male former smoker followed for COPD with emphysema Previously on nocturnal oxygen .  Medical history significant for rheumatoid arthritis (on Remicade and Arava)  TEST/EVENTS :  CT chest January 2022 negative for ILD, elevation of right hemidiaphragm with associated scarring and atelectasis in the right base, emphysema  09/27/2021 Follow up ; COPD  Patient returns for a follow-up visit.  Patient was recently seen by cardiology yesterday and treated for COPD exacerbation.  He was having some increased cough shortness of breath intermittent chest pains for last 3 days . Patient was given a Z-Pak. Says it has helped slightly with cough .  Patient complains of increased cough, congestion and wheezing . Cough is minimally productive.  Covid test at home negative .  He remains on Bevespi inhaler twice daily. Eating well w/ no n/v/d.     No Known Allergies  Immunization History  Administered Date(s) Administered   Influenza, High Dose Seasonal PF 07/23/2017, 08/05/2019, 08/30/2020   Influenza,inj,Quad PF,6+ Mos 08/01/2017   Influenza-Unspecified 10/30/2015, 09/13/2016, 07/30/2018, 08/05/2019   PFIZER(Purple Top)SARS-COV-2 Vaccination 01/28/2020, 02/18/2020, 09/19/2020   Pneumococcal Conjugate-13 12/05/2015   Pneumococcal Polysaccharide-23 04/23/2017   Tdap 12/09/2015   Zoster, Live 12/09/2015    Past Medical History:  Diagnosis Date   3-vessel coronary artery disease 03/10/2020   Allergy    Anemia    Aortic valve calcification 03/10/2020   Arthritis    Asymptomatic microscopic hematuria 04/03/2016   Cystoscopy 02/207 Dr. Pilar [facility].     Atypical chest pain    a. 09/2016 MV: EF 63%, normal perfusion.   Benign prostatic hyperplasia 04/03/2016   Asymptomatic. Normal PSA  and exam by Dr. Pilar Jarvis [facility] 01/2016.     Bilateral carotid bruits 02/09/2019   Bladder wall thickening 03/10/2020   CAD (coronary artery disease)    Coronary artery calcification on Chest CT in 09/2016 and 01/2019 // Myoview 10/17: normal perfusion // Myoview 01/2019: no ischemia or infarction, EF 58 // Coronary CTA 03/2020: Calcium Score 1067; non-obstructive CAD [oLM 1-24, LAD ost 25049, mid and dist 1-24; D1 1-24; LCx prox and mid 1-24, OM1 and OM2 25-49, RCA and PLB 1-24; PLA 1-24]    Carotid bruit    Carotid Dopplers 01/2019: no ICA stenosis bilaterally    Colonic polyp 02/23/2016   COVID-19    DDD (degenerative disc disease), 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 L4-5 and J3-H5    Diastolic dysfunction    a. 2015 Echo: EF >55%;  b. 09/2016 Echo: EF 55-60%, no rwma, Gr1 DD, triv AI/MR, mild TR, PASP 69mmHg.   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   On CT 01/2016    Palpitations    Echo 10/17: Mild focal basal septal hypertrophy, EF 55-60, no RWMA, Gr 1 DD, trivial MR, normal RVSF, mild TR, PASP 28 // Event monitor 01/2019: NSR, PACs   Prediabetes 10/29/2018   Pulmonary emphysema ([place]) 12/18/2016   Renal cyst, left 04/03/2016  On CT, benign    Rheumatoid arthritis ([place])    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[place]) 11/04/2016   -RF,-CCP,-1433eta, erosive disease with contractures   Tubular adenoma of colon 01/2016   Vitamin D deficiency 11/04/2016    Tobacco History: Social History   Tobacco Use  Smoking Status Former   Packs/day: 1.00   Years: 17.00   Pack years: 17.00   Types: Cigarettes   Quit date: 12/23/1993   Years since  quitting: 27.7  Smokeless Tobacco Never   Counseling given: Not Answered   Outpatient Medications Prior to Visit  Medication Sig Dispense Refill   albuterol (VENTOLIN HFA) 108 (90 Base) MCG/ACT inhaler Inhale 2 puffs into the lungs every 6 (six) hours as needed for wheezing or shortness of breath. 8 g 6   Ascorbic Acid (VITAMIN C PO) Take 1 tablet by mouth daily.     aspirin EC 81 MG tablet Take 1 tablet (81 mg total) by mouth daily. 90 tablet 3   atorvastatin (LIPITOR) 40 MG tablet TAKE 1 TABLET BY MOUTH EVERY DAY 90 tablet 2   azithromycin (ZITHROMAX) 250 MG tablet Take 2 pills today than 1 pill everyday X 4 days till all. 6 each 0   Calcium Carb-Cholecalciferol (CALCIUM-VITAMIN D3) 600-400 MG-UNIT TABS Take 1 tablet by mouth 2 (two) times daily.     Glycopyrrolate-Formoterol (BEVESPI AEROSPHERE) 9-4.8 MCG/ACT AERO Inhale 2 puffs into the lungs in the morning and at bedtime. 32.1 g 3   inFLIXimab (REMICADE IV) Inject 100 mg into the vein as needed (for arthrithis).     isosorbide mononitrate (IMDUR) 30 MG 24 hr tablet Take 0.5 tablets (15 mg total) by mouth daily. 45 tablet 3   leflunomide (ARAVA) 20 MG tablet 1 tablet     metoprolol succinate (TOPROL-XL) 25 MG 24 hr tablet TAKE 1 TABLET BY MOUTH EVERYDAY AT BEDTIME 90 tablet 2   MYRBETRIQ 50 MG TB24 tablet Take 50 mg by mouth daily.      nitroGLYCERIN (NITROSTAT) 0.4 MG SL tablet PLACE 1 TABLET UNDER THE TONGUE EVERY 5 MINUTES AS NEEDED FOR CHEST PAIN. 25 tablet 6   pantoprazole (PROTONIX) 40 MG tablet TAKE 1 TABLET BY MOUTH EVERY DAY 90 tablet 0   leflunomide (ARAVA) 20 MG tablet Take 1 tablet by mouth daily.     No facility-administered medications prior to visit.     Review of Systems:   Constitutional:   No  weight loss, night sweats,  Fevers, chills, fatigue, or  lassitude.  HEENT:   No headaches,  Difficulty swallowing,  Tooth/dental problems, or  Sore throat,                No sneezing, itching, ear ache, nasal congestion,  post nasal drip,   CV:  No chest pain,  Orthopnea, PND, swelling in lower extremities, anasarca, dizziness, palpitations, syncope.   GI  No heartburn, indigestion, abdominal pain, nausea, vomiting, diarrhea, change in bowel habits, loss of appetite, bloody stools.   Resp: No shortness of breath with exertion or at rest.  No excess mucus, no productive cough,  No non-productive cough,  No coughing up of blood.  No change in color of mucus.  No wheezing.  No chest wall deformity  Skin: no rash or lesions.  GU: no dysuria, change in color of urine, no urgency or frequency.  No flank pain, no hematuria   MS:  No joint pain or swelling.  No decreased range of motion.  No back pain.  Physical Exam  BP 110/62 (BP Location: Left Arm, Patient Position: Sitting, Cuff Size: Normal)   Pulse 96   Temp 98.6 F (37 C) (Oral)   Ht 5\' 2"  (1.575 m)   Wt 134 lb (60.8 kg)   SpO2 97%   BMI 24.51 kg/m   GEN: A/Ox3; pleasant , NAD, eldelry , cane    HEENT:  [place]/AT,  NOSE-clear, THROAT-clear, no lesions, no postnasal drip or exudate noted.   NECK:  Supple w/ fair ROM; no JVD; normal carotid impulses w/o bruits; no thyromegaly or nodules palpated; no lymphadenopathy.    RESP  scattered rhonchi   no accessory muscle use, no dullness to percussion  CARD:  RRR, no m/r/g, no peripheral edema, pulses intact, no cyanosis or clubbing.  GI:   Soft & nt; nml bowel sounds; no organomegaly or masses detected.   Musco: Warm bil, no deformities or joint swelling noted.   Neuro: alert, no focal deficits noted.    Skin: Warm, no lesions or rashes    Lab Results:  CBC    Component Value Date/Time   WBC 4.5 09/26/2021 1436   WBC 5.4 12/27/2020 0000   RBC 4.25 09/26/2021 1436   RBC 4.56 12/27/2020 0000   HGB 11.0 (L) 09/26/2021 1436   HCT 33.3 (L) 09/26/2021 1436   PLT 273 09/26/2021 1436   MCV 78 (L) 09/26/2021 1436   MCH 25.9 (L) 09/26/2021 1436   MCH 29.8 12/27/2020 0000   MCHC 33.0  09/26/2021 1436   MCHC 34.3 12/27/2020 0000   RDW 15.7 (H) 09/26/2021 1436   LYMPHSABS 0.5 (L) 09/26/2021 1436   MONOABS 392 07/14/2017 1123   EOSABS 0.1 09/26/2021 1436   BASOSABS 0.0 09/26/2021 1436    BMET    Component Value Date/Time   NA 135 09/26/2021 1436   K 4.3 09/26/2021 1436   CL 99 09/26/2021 1436   CO2 18 (L) 09/26/2021 1436   GLUCOSE 100 (H) 09/26/2021 1436   GLUCOSE 90 12/27/2020 0000   BUN 11 09/26/2021 1436   CREATININE 1.16 09/26/2021 1436   CREATININE 1.43 (H) 12/27/2020 0000   CALCIUM 8.8 09/26/2021 1436   GFRNONAA 47 (L) 12/27/2020 0000   GFRAA 54 (L) 12/27/2020 0000    BNP No results found for: BNP  ProBNP    Component Value Date/Time   PROBNP 66 09/26/2021 1436    Imaging: No results found.    PFT Results Latest Ref Rng & Units 11/20/2020 11/26/2016  FVC-Pre L 1.91 1.79  FVC-Predicted Pre % 71 65  FVC-Post L - 1.81  FVC-Predicted Post % - 65  Pre FEV1/FVC % % 81 81  Post FEV1/FCV % % - 82  FEV1-Pre L 1.55 1.46  FEV1-Predicted Pre % 80 73  FEV1-Post L - 1.48  DLCO uncorrected ml/min/mmHg 11.83 12.16  DLCO UNC% % 60 53  DLCO corrected ml/min/mmHg 12.43 12.31  DLCO COR %Predicted % 63 53  DLVA Predicted % 99 98  TLC L - 3.64  TLC % Predicted % - 64  RV % Predicted % - 86    No results found for: NITRICOXIDE      Assessment & Plan:   COPD with acute exacerbation (HCC) Flare -  Finish abx , add steroids  Chest xray is pending   Plan  Patient Instructions  Finish Zpack as directed  Mucinex DM Twice daily  As needed  cough/congestion  Prednisone taper over next week  Continue on BEVESPI 2 puffs Twice daily Albuterol inhaler  As needed   Follow up with Dr. Chase Caller in 6-8 weeks and As needed   Please contact office for sooner follow up if symptoms do not improve or worsen or seek emergency care         Rexene Edison, NP 09/27/2021

## 2021-09-27 NOTE — Assessment & Plan Note (Signed)
Flare -  Finish abx , add steroids  Chest xray is pending   Plan  Patient Instructions  Finish Zpack as directed  Mucinex DM Twice daily  As needed  cough/congestion  Prednisone taper over next week  Continue on BEVESPI 2 puffs Twice daily Albuterol inhaler As needed   Follow up with Dr. Chase Caller in 6-8 weeks and As needed   Please contact office for sooner follow up if symptoms do not improve or worsen or seek emergency care

## 2021-10-02 ENCOUNTER — Telehealth: Payer: Self-pay | Admitting: Family Medicine

## 2021-10-02 ENCOUNTER — Other Ambulatory Visit: Payer: Self-pay

## 2021-10-02 ENCOUNTER — Encounter: Payer: Self-pay | Admitting: Family Medicine

## 2021-10-02 ENCOUNTER — Ambulatory Visit (INDEPENDENT_AMBULATORY_CARE_PROVIDER_SITE_OTHER): Payer: Medicare HMO | Admitting: Family Medicine

## 2021-10-02 VITALS — BP 136/80 | HR 104 | Wt 135.8 lb

## 2021-10-02 DIAGNOSIS — R0602 Shortness of breath: Secondary | ICD-10-CM

## 2021-10-02 DIAGNOSIS — R0989 Other specified symptoms and signs involving the circulatory and respiratory systems: Secondary | ICD-10-CM

## 2021-10-02 DIAGNOSIS — R051 Acute cough: Secondary | ICD-10-CM

## 2021-10-02 DIAGNOSIS — Z8719 Personal history of other diseases of the digestive system: Secondary | ICD-10-CM | POA: Diagnosis not present

## 2021-10-02 DIAGNOSIS — D509 Iron deficiency anemia, unspecified: Secondary | ICD-10-CM

## 2021-10-02 DIAGNOSIS — K5730 Diverticulosis of large intestine without perforation or abscess without bleeding: Secondary | ICD-10-CM | POA: Diagnosis not present

## 2021-10-02 DIAGNOSIS — J438 Other emphysema: Secondary | ICD-10-CM

## 2021-10-02 MED ORDER — AMOXICILLIN-POT CLAVULANATE 875-125 MG PO TABS: 1.0000 | ORAL_TABLET | Freq: Two times a day (BID) | ORAL | 0 refills | Status: DC

## 2021-10-02 NOTE — Telephone Encounter (Signed)
Tony Mcmillan,  I saw Mr Patry in my office today and he is still having significant shortness of breath with scattered rhonchi and chest congestion. Pulse ox fine at 98%. No acute distress. He completed a Z-pak and has 2 days of prednisone left. Would you recommend another antibiotic at this point? I am fine to prescribe this for him or if you want to check in with him that would be good too. Thanks for taking good care of him.  

## 2021-10-02 NOTE — Progress Notes (Signed)
   Subjective:    Patient ID: Tony Mcmillan, male    DOB: [birth year], 80 y.o.   MRN: [id number]  HPI Chief Complaint  Patient presents with   Anemia   He is here to follow up on anemia. Recent labs at cardiology office showed Hgb 11.0 and Hct 33.3. previously in 2022 his Hgb was 13.6, 13.0.  States he stopped taking iron several months ago for no particular reason.  Denies fatigue, dizziness, chest pain. No bleeding. Denies abdominal pain, N/V/D or constipation.  States he has not been taking aspirin for the past week.   Colonoscopy UTD. Diverticula and hx of duodenal ulcer   He has also been having cough and shortness of breath. Completed Z-pak which was prescribed by cardiologist. He also saw his pulmonologist for current symptoms and was started on steroid dose pak which he is still taking.  Taking Mucinex also and using inhaler. States he still has chest congestion and cough. Shortness of breath improved.   No fever, chills, chest pain, or palpitations.   Reviewed allergies, medications, past medical, surgical, family, and social history.    Review of Systems Pertinent positives and negatives in the history of present illness.     Objective:   Physical Exam BP 136/80 (BP Location: Left Arm, Patient Position: Sitting)   Pulse (!) 104   Wt 135 lb 12.8 oz (61.6 kg)   SpO2 98%   BMI 24.84 kg/m         Assessment & Plan:  Microcytic anemia - Plan: Fecal occult blood, imunochemical([facility]), CBC with Differential/Platelet, Iron, TIBC and Ferritin Panel  Diverticula of colon - Plan: Fecal occult blood, imunochemical([facility]), CBC with Differential/Platelet  History of duodenal ulcer - Plan: CBC with Differential/Platelet  Acute cough - Plan: CBC with Differential/Platelet  Shortness of breath - Plan: CBC with Differential/Platelet  Other emphysema (HCC)  Abnormal lung sounds  Anemia- no obvious bleeding per patient. He stopped his iron and may  need to start back on it. I will recheck his CBC and also iron studies. Stool cards done and he will return it. Consider referral back to GI  Cough and congestion with significant rhonchi throughout. He will complete the steroids. Discussed patient with pulmonologist Tammy and I will prescribe additional steroids. He will use inhalers as needed and follow up with pulmonology if worsening.

## 2021-10-02 NOTE — Telephone Encounter (Signed)
Yes definitely can given him another abx. , may need something stronger than Zpack .  Hate that he is not better yet .

## 2021-10-03 LAB — CBC WITH DIFFERENTIAL/PLATELET
Basophils Absolute: 0 10*3/uL (ref 0.0–0.2)
Basos: 0 %
EOS (ABSOLUTE): 0 10*3/uL (ref 0.0–0.4)
Eos: 0 %
Hematocrit: 36.8 % — ABNORMAL LOW (ref 37.5–51.0)
Hemoglobin: 11.6 g/dL — ABNORMAL LOW (ref 13.0–17.7)
Immature Grans (Abs): 0 10*3/uL (ref 0.0–0.1)
Immature Granulocytes: 1 %
Lymphocytes Absolute: 0.4 10*3/uL — ABNORMAL LOW (ref 0.7–3.1)
Lymphs: 5 %
MCH: 25.6 pg — ABNORMAL LOW (ref 26.6–33.0)
MCHC: 31.5 g/dL (ref 31.5–35.7)
MCV: 81 fL (ref 79–97)
Monocytes Absolute: 0.5 10*3/uL (ref 0.1–0.9)
Monocytes: 7 %
Neutrophils Absolute: 6.3 10*3/uL (ref 1.4–7.0)
Neutrophils: 87 %
Platelets: 372 10*3/uL (ref 150–450)
RBC: 4.54 x10E6/uL (ref 4.14–5.80)
RDW: 15.7 % — ABNORMAL HIGH (ref 11.6–15.4)
WBC: 7.2 10*3/uL (ref 3.4–10.8)

## 2021-10-03 LAB — IRON,TIBC AND FERRITIN PANEL
Ferritin: 44 ng/mL (ref 30–400)
Iron Saturation: 6 % — CL (ref 15–55)
Iron: 19 ug/dL — ABNORMAL LOW (ref 38–169)
Total Iron Binding Capacity: 339 ug/dL (ref 250–450)
UIBC: 320 ug/dL (ref 111–343)

## 2021-10-03 NOTE — Progress Notes (Signed)
He needs a follow up appointment in office for anemia in 2 months after being on an iron supplement.  Thanks.

## 2021-10-08 ENCOUNTER — Ambulatory Visit: Payer: Medicare HMO | Admitting: Family Medicine

## 2021-10-09 ENCOUNTER — Ambulatory Visit: Payer: Medicare HMO | Admitting: Physician Assistant

## 2021-10-10 LAB — FECAL OCCULT BLOOD, IMMUNOCHEMICAL: Fecal Occult Bld: NEGATIVE

## 2021-11-04 ENCOUNTER — Other Ambulatory Visit: Payer: Self-pay | Admitting: Gastroenterology

## 2021-11-08 ENCOUNTER — Other Ambulatory Visit: Payer: Self-pay

## 2021-11-08 ENCOUNTER — Ambulatory Visit (INDEPENDENT_AMBULATORY_CARE_PROVIDER_SITE_OTHER): Payer: Medicare HMO | Admitting: Internal Medicine

## 2021-11-08 ENCOUNTER — Encounter: Payer: Self-pay | Admitting: Internal Medicine

## 2021-11-08 VITALS — BP 120/70 | HR 80 | Ht 62.0 in | Wt 139.8 lb

## 2021-11-08 DIAGNOSIS — M069 Rheumatoid arthritis, unspecified: Secondary | ICD-10-CM

## 2021-11-08 DIAGNOSIS — J439 Emphysema, unspecified: Secondary | ICD-10-CM

## 2021-11-08 MED ORDER — BEVESPI AEROSPHERE 9-4.8 MCG/ACT IN AERO: 2.0000 | INHALATION_SPRAY | Freq: Two times a day (BID) | RESPIRATORY_TRACT | 3 refills | Status: DC

## 2021-11-08 MED ORDER — ALBUTEROL SULFATE HFA 108 (90 BASE) MCG/ACT IN AERS: 2.0000 | INHALATION_SPRAY | Freq: Four times a day (QID) | RESPIRATORY_TRACT | 6 refills | Status: DC | PRN

## 2021-11-08 NOTE — Patient Instructions (Addendum)
ICD-10-CM   1. Pulmonary emphysema, unspecified emphysema type ([place])  J43.9     2. Rheumatoid arthritis involving multiple sites, unspecified whether rheumatoid factor present (HCC)  M06.9       COPD stable.  No new issues.  Glad you are up-to-date with her vaccines Glad you are improved from the exacerbation in October 2022  [place] as before 2 puff 2 times daily - Continue albuterol as needed  -CMA to ensure early refills  Follow-up - Return in 9 months or sooner if needed

## 2021-11-08 NOTE — Progress Notes (Signed)
OV 02/21/2017  Chief Complaint  Patient presents with   Follow-up    Pt states his breathing is unchanged. Pt c/o right sided chest tightness that is persistent  - since 11/2016. Pt c/o prod cough with clear mucus.     80 year old African-American male with RA on methotrexate. Has dyspnea on exertion. Workup over the last few months as showing emphysema with isolated reduction in diffusion capacity. He's been started on Spiriva and nocturnal oxygen. He returns for follow-up after institution of these therapies. He tells me that he's not sure if these therapies have helped. He thinks it might have helped. However he is reporting new onset in the last month or 2 but definitely postdating starting of the Spiriva therapy is constant chest tightness on the right breast area. There is no clear cut aggravating factor or relieving factor. It is not a pain but it as a chest tightness. This no radiation. It is mild to moderate but definitely annoying. This no fever or chills or wheezing is definitely seeking relief for this.     03/24/2017 Follow Up Appointment: Pt. Presents today for follow up. He was seen by Dr. Chase Caller 02/25/2017. He had been switched from Spiriva to [facility] for continued chest tightness.  Pt. States he does not really notice much of a change. He still is experiencing  some very mild chest tightness every now and again.There is no patten to the chest tightness.He states it is not bad chest tightness and that it self resolves.He was seen by cardiology 01/2017 normal ECG, and previously normal ischemic evaluation. They signed off for follow up as needed. He is agreeable to continuing on the [place] as he has notes a slight improvement. We did not start a rescue inhaler as there was no BD response on PFT. He denies any chest pain, fever, orthopnea or hemoptysis. Minimal secretions with cough.He has added calcium and Vitamin D3 as treatment to his medication regimen while on methotrexate. He  states he is complaint with his nocturnal oxygen.Saturations today are 94% on RA today.He is compliant with his Pulmonary rehab, and feels he is benefiting from treatment.  Tests 2-D echo October 2017 showed grade 1 diastolic dysfunction. Cardiac stress test negative in October 2017 Walk test in the office December 2017 did not show any type of desaturation. Chest x-ray October 2017 showed clear lungs High resolution CT chest 12/03/2016 showed no ILD, mild emphysema, mild diffuse bronchial wall thickening, focal area of scarring in the right upper lobe ono 11/29/16 shows pulse ox </= 88% at 46 min. Start 1L [place]  Pulmonary function test done 11/26/2016 showed an FEV1 at 74%, ratio 82, FVC 65%, no bronchodilator response, ERV 138%, DLCO 53%  OV 07/01/2017  Chief Complaint  Patient presents with   Follow-up    Pt states he has right sided chest tightness that is constant. Pt denies significant cough, f/c/s, and SOB.    Follow-up rheumatoid arthritis patient with emphysema and associated right-sided chest tightness  This is a routine follow-up. Last summer nurse partition April 2018. He is on dual inhaler therapy scheduled long-acting beta agonist and anticholinergic may by [employer]. This is working well for him. He is stable in terms of dyspnea. In fact is no dyspnea. He is attending pulmonary rehabilitation and has no problems. His main issues that he still continues to have moderate amount of right-sided chest tightness between the right breast and the right sternal area. The circular area around 5 or 7 cm  in size. The constant feeling of tightness. Is moderate in severity. Present for several months without any progression. No radiation. No diaphoresis. He is frustrated by it. The inhalers are not helping.   OV 10/28/2017 Chief Complaint  Patient presents with   Follow-up    Pt here due to [place] stating pt needing to be re-evaluated. States that he wears 1L O2 at night. States that he  has occ. coughing with occ. chest tightness.    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 He wants to be followed For his nocturnal oxygen. He is stable in terms of dyspnea. He is on Bevespi which he is working well for him. He is up-to-date with his flu shot. He does have some random episodes of cough particularly when the weather [place] mother does want it is mild and manageable but he does want to add on inhaler for this. Walking desaturation to as 185 feet 3 laps on room air: Resting pulse ox 100%. Final pulse ox 96%. Resting heart rate 87/m. Final heart rate 114/m.  Unexplained right-sided chest tightness: This persists without change  OV 05/04/2018  Chief Complaint  Patient presents with   Follow-up    Pt states he has been doing good since last visit.  Pt still has some pain midsternal, some mild SOB, and a mild cough.   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  Presents for routine follow-up.  Since I last saw him his methotrexate has been reduced because of a rising creatinine.  Morrie Sheldon has been added.  This is in April 2019.  I personally reviewed the chart by Dr. D and confirmed the findings.  I also visualized the results.  In terms of his emphysema and dyspnea this is stable.  He continues to be on inhaler Bevespi 2 puffs 2 times daily and is compliant with it.  There are no new issues.  He has upcoming travel for family vacation for a few to several days.  He wants to know if he can discontinue his nocturnal oxygen.  Overall and the time that he has spent since he started the nocturnal oxygen in terms of his dyspnea he is better.  He has never desaturated with exertion in the past.  There is no cough.  His unexplained right-sided chest tightness  which is constant versus intermittent without any reproducible tenderness is still ongoing.    11/20/2020  - Visit   80 year old male former smoker followed in our office for shortness of breath.  Patient completing follow-up with our office after last being seen on 10/09/2020.  Plan of care from that office visit was: Walk in office, chest x-ray, order pulmonary function testing, continue follow-up with rheumatology.  Patient completing pulmonary function testing today.  Those results are listed below:  11/20/2020-pulmonary function test-FVC 1.91 (71% predicted), ratio 81, FEV1 1.55 (80% predicted), DLCO 11.83 (60% predicted)  Patient reports has been doing well since last being seen.  He still does have shortness of breath.  He was previously managed on methotrexate.  This was changed at his last rheumatology visit.  He is on Rinvoq.  He will follow up with him in another couple of months.   Questionaires / Pulmonary Flowsheets:   ACT:  No flowsheet data found.  MMRC: mMRC Dyspnea Scale mMRC Score  10/09/2020 2  Epworth:  No flowsheet data found.  Tests:   11/26/2016-pulmonary function test-FVC 1.79 (65% predicted), postbronchodilator ratio 82, postbronchodilator FEV1 1.81 (65% predicted), no bronchodilator response, DLCO 12.16 (53% predicted)  12/03/2016-CT chest high-res-no findings to suggest interstitial lung disease, mild diffuse bronchial wall thickening with mild centrilobular emphysema, imaging findings suggestive of underlying COPD, focal area of scarring in the posterior aspect of the right upper lobe likely from prior infection, aortic arthrosclerosis, mild calcifications of aortic valve  03/22/2019-cardiac telemetry-normal sinus rhythm, occasional PACs  10/14/2016-echocardiogram-LV ejection fraction 55 to 30%, grade 1 diastolic dysfunction, right systolic pressure 28  OV 0/92/3300  Subjective:  Patient ID: Tony Mcmillan, male , DOB: 1941/08/15 , age 89 y.o. , MRN:  [id number] , ADDRESS: [address] [place] [address] PCP Girtha Rm, NP-C Patient Care Team: Girtha Rm, NP-C as PCP - General (Family Medicine) Josue Hector, MD as PCP - Cardiology (Cardiology)  This Provider for this visit: Treatment Team:  Attending Provider: Brand Males, MD    01/11/2021 -   Chief Complaint  Patient presents with   Follow-up    Pt states he has been doing okay since last visit and denies any complaints.    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 -returns for follow-up.  I personally last saw him in May 2019.  Then towards the end of 2020 when he saw a nurse practitioner.  He had a CT scan of the chest that shows elevated right hemidiaphragm but no fibrosis.  He has emphysema.  He continues to feel stable.  He still has an unexplained right-sided chest tightness.  He uses a cane.  He drives around.  His COPD CAT score is 9 but he wants to improve his symptoms further.  Filled out DME form at his request.    CAT Score 01/11/2021 11/20/2020 08/03/2019  Total CAT Score 9 11 5      CT Chest data  No results found.   Plan - CT chest IMPRESSION: 1. No evidence of fibrotic interstitial lung disease. 2. Elevation of the right hemidiaphragm with associated scarring and or atelectasis of the right lung base. 3. Emphysema. 4. Coronary artery disease.   Aortic Atherosclerosis (ICD10-I70.0) and Emphysema (ICD10-J43.9).     Electronically Signed   By: Eddie Candle M.D.   On: 01/01/2021 10:24  07/16/2021- Interim hx  Patient presents today for 6 month follow-up emphysema, hx RA. During his last visit he was felt to be clinically stable, he was given sample of Breztri. He is doing fairly well today. He reports some occasional chest tightness and wheezing. He  does not currently have an active cough. He stopped using Breztri after reading drug information, he prefers [place] and feels this works well for him. He will need a prescription as he has ran out of medication. He uses his Albuterol rescue inhaler 1-2 times a month. HRCT in January 2022 showed no evidence of ILD, scarring/atelectasis right lung base and emphysema. He is on Remicade and Arava for his rheumatoid arthritis.  He uses a cane to walk. He does his own shopping. No falls.   09/27/2021 Follow up ; COPD  exacrbat Patient returns for a follow-up visit.  Patient was recently seen by cardiology yesterday and treated for COPD exacerbation.  He was having some increased cough shortness of breath intermittent chest pains for last  3 days . Patient was given a Z-Pak. Says it has helped slightly with cough .  Patient complains of increased cough, congestion and wheezing . Cough is minimally productive.  Covid test at home negative .  He remains on Bevespi inhaler twice daily. Eating well w/ no n/v/d.   OV 11/08/2021  Subjective:  Patient ID: Tony Mcmillan, male , DOB: 14-Apr-1941 , age 69 y.o. , MRN: [id number] , ADDRESS: [address] [place] [id number] PCP Girtha Rm, PA-C Patient Care Team: Davy Pique as PCP - General (Family Medicine) Freada Bergeron, MD as PCP - Cardiology (Cardiology) Sharmon Revere as Physician Assistant (Cardiology) Brand Males, MD as Consulting Physician (Pulmonary Disease)  This Provider for this visit: Treatment Team:  Attending Provider: Brand Males, MD   Follow-up emphysema on nocturnal oxygen t Has associtted rheumatoid arthritis  -on infliximab and Arava Follow-up unexplained right-sided chest tightness -associated with elevation of right hemidiaphragm No evidence of lung nodules  -Last CT scan of the chest January 2022  11/08/2021 -   Chief Complaint  Patient presents with   Follow-up      HPI Tony Mcmillan 80 y.o. -returns for follow-up.  He is an avid Tourist information centre manager.  He is always dressed In their outfit.  He continues to feel stable.  He saw Patricia N[place] nurse practitioner in October 2022 for COPD exacerbation.  He is back to his baseline.  He wants refills on his albuterol.  He continues with Bevespi.  He is up-to-date with his vaccines.  He endorses no new complaints.   CT Chest data  No results found.    PFT  PFT Results Latest Ref Rng & Units 11/20/2020 11/26/2016  FVC-Pre L 1.91 1.79  FVC-Predicted Pre % 71 65  FVC-Post L - 1.81  FVC-Predicted Post % - 65  Pre FEV1/FVC % % 81 81  Post FEV1/FCV % % - 82  FEV1-Pre L 1.55 1.46  FEV1-Predicted Pre % 80 73  FEV1-Post L - 1.48  DLCO uncorrected ml/min/mmHg 11.83 12.16  DLCO UNC% % 60 53  DLCO corrected ml/min/mmHg 12.43 12.31  DLCO COR %Predicted % 63 53  DLVA Predicted % 99 98  TLC L - 3.64  TLC % Predicted % - 64  RV % Predicted % - 86       has a past medical history of 3-vessel coronary artery disease (03/10/2020), Allergy, Anemia, Aortic valve calcification (03/10/2020), Arthritis, Asymptomatic microscopic hematuria (04/03/2016), Atypical chest pain, Benign prostatic hyperplasia (04/03/2016), Bilateral carotid bruits (02/09/2019), Bladder wall thickening (03/10/2020), CAD (coronary artery disease), Carotid bruit, Colonic polyp (02/23/2016), COVID-19, DDD (degenerative disc disease), lumbar (37/34/2876), Diastolic dysfunction, DJD (degenerative joint disease), cervical (11/04/2016), Duodenal ulcer (02/23/2016), Emphysema of lung ([place]) (02/08/2016), Enlarged prostate (03/10/2020), GERD (gastroesophageal reflux disease), History of systemic steroid therapy (10/30/2017), Hx of Blood Transfusion, Hyperlipidemia, Intermittent palpitations (01/29/2019), 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   reports that he quit smoking about 27 years ago. His smoking use included cigarettes. He has a 17.00 pack-year smoking history. He has never used smokeless tobacco.  Past Surgical History:  Procedure Laterality Date   ANTERIOR CERVICAL CORPECTOMY     ANTERIOR CERVICAL CORPECTOMY  12/2014   for infection. this was in [place]  both eyes   COLONOSCOPY     COLOSTOMY REVERSAL     had infection on buttocks and had to have a skin graft- had colostomy to help area stay clean and heal   HERNIA REPAIR     POLYPECTOMY     SPINE SURGERY      No Known Allergies  Immunization History  Administered Date(s) Administered   Influenza, High Dose Seasonal PF 07/23/2017, 08/05/2019, 08/30/2020, 08/22/2021   Influenza,inj,Quad PF,6+ Mos 08/01/2017   Influenza-Unspecified 10/30/2015, 09/13/2016, 07/30/2018, 08/05/2019   PFIZER(Purple Top)SARS-COV-2 Vaccination 01/28/2020, 02/18/2020, 09/19/2020   Pneumococcal Conjugate-13 12/05/2015   Pneumococcal Polysaccharide-23 04/23/2017   Tdap 12/09/2015   Zoster, Live 12/09/2015    Family History  Problem Relation Age of Onset   Hypertension Mother    Hypertension Father    Diabetes Father    Heart disease Father        stents   Hyperlipidemia Sister    Diabetes Sister    Stroke Brother    Diabetes Brother    Heart disease Brother        stents   Colon polyps Neg Hx    Esophageal cancer Neg Hx    Stomach cancer Neg Hx    Rectal cancer Neg Hx    Colon cancer Neg Hx      Current Outpatient Medications:    Ascorbic Acid (VITAMIN C PO), Take 1 tablet by mouth daily., Disp: , Rfl:    atorvastatin (LIPITOR) 40 MG tablet, TAKE 1 TABLET BY MOUTH EVERY DAY, Disp: 90 tablet, Rfl: 2   Calcium Carb-Cholecalciferol (CALCIUM-VITAMIN D3) 600-400 MG-UNIT  TABS, Take 1 tablet by mouth 2 (two) times daily., Disp: , Rfl:    Glycopyrrolate-Formoterol (BEVESPI AEROSPHERE) 9-4.8 MCG/ACT AERO, Inhale 2 puffs into the lungs in the morning and at bedtime., Disp: 32.1 g, Rfl: 3   inFLIXimab (REMICADE IV), Inject 100 mg into the vein as needed (for arthrithis)., Disp: , Rfl:    isosorbide mononitrate (IMDUR) 30 MG 24 hr tablet, Take 0.5 tablets (15 mg total) by mouth daily., Disp: 45 tablet, Rfl: 3   leflunomide (ARAVA) 20 MG tablet, 1 tablet, Disp: , Rfl:    metoprolol succinate (TOPROL-XL) 25 MG 24 hr tablet, TAKE 1 TABLET BY MOUTH EVERYDAY AT BEDTIME, Disp: 90 tablet, Rfl: 2   MYRBETRIQ 50 MG TB24 tablet, Take 50 mg by mouth daily. , Disp: , Rfl:    nitroGLYCERIN (NITROSTAT) 0.4 MG SL tablet, PLACE 1 TABLET UNDER THE TONGUE EVERY 5 MINUTES AS NEEDED FOR CHEST PAIN., Disp: 25 tablet, Rfl: 6   pantoprazole (PROTONIX) 40 MG tablet, TAKE 1 TABLET BY MOUTH EVERY DAY, Disp: 90 tablet, Rfl: 0   albuterol (VENTOLIN HFA) 108 (90 Base) MCG/ACT inhaler, Inhale 2 puffs into the lungs every 6 (six) hours as needed for wheezing or shortness of breath., Disp: 8 g, Rfl: 6   amoxicillin-clavulanate (AUGMENTIN) 875-125 MG tablet, Take 1 tablet by mouth 2 (two) times daily. (Patient not taking: Reported on 11/08/2021), Disp: 20 tablet, Rfl: 0   aspirin EC 81 MG tablet, Take 1 tablet (81 mg total) by mouth daily. (Patient not taking: Reported on 11/08/2021), Disp: 90 tablet, Rfl: 3   azithromycin (ZITHROMAX) 250 MG tablet, Take 2 pills today than 1 pill everyday X 4 days till all. (Patient not taking: Reported on 11/08/2021), Disp: 6 each, Rfl: 0   predniSONE (DELTASONE) 10 MG tablet, 4 tabs for 2 days, then 3 tabs for 2 days, 2 tabs for 2 days, then  1 tab for 2 days, then stop (Patient not taking: Reported on 11/08/2021), Disp: 20 tablet, Rfl: 0      Objective:   Vitals:   11/08/21 1138  BP: 120/70  Pulse: 80  SpO2: 97%  Weight: 139 lb 12.8 oz (63.4 kg)  Height: 5\' 2"   (1.575 m)    Estimated body mass index is 25.57 kg/m as calculated from the following:   Height as of this encounter: 5\' 2"  (1.575 m).   Weight as of this encounter: 139 lb 12.8 oz (63.4 kg).  @WEIGHTCHANGE @  Filed Weights   11/08/21 1138  Weight: 139 lb 12.8 oz (63.4 kg)     Physical Exam  General: No distress. Looks wll Neuro: Alert and Oriented x 3. GCS 15. Speech normal Psych: Pleasant Resp:  Barrel Chest - no.  Wheeze - no, Crackles - no, No overt respiratory distress CVS: Normal heart sounds. Murmurs - no Ext: Stigmata of Connective Tissue Disease - no HEENT: Normal upper airway. PEERL +. No post nasal drip        Assessment:       ICD-10-CM   1. Pulmonary emphysema, unspecified emphysema type ([place])  J43.9     2. Rheumatoid arthritis involving multiple sites, unspecified whether rheumatoid factor present ([place])  M06.9          Plan:     Patient Instructions     ICD-10-CM   1. Pulmonary emphysema, unspecified emphysema type ([place])  J43.9     2. Rheumatoid arthritis involving multiple sites, unspecified whether rheumatoid factor present ([facility])  M06.9       COPD stable.  No new issues.  Glad you are up-to-date with her vaccines Glad you are improved from the exacerbation in October 2022  [place] as before 2 puff 2 times daily - Continue albuterol as needed  -CMA to ensure early refills  Follow-up - Return in 9 months or sooner if needed     SIGNATURE    Dr. Brand Males, M.D., F.C.C.P,  Pulmonary and Critical Care Medicine Staff Physician, [place] Director - Interstitial Lung Disease  Program  Pulmonary [facility] at [place], [place], [address]  Pager: [phone], If no answer or between  15:00h - 7:00h: call [phone] Telephone: [phone]  12:12 PM 11/08/2021

## 2021-12-03 ENCOUNTER — Ambulatory Visit (INDEPENDENT_AMBULATORY_CARE_PROVIDER_SITE_OTHER): Payer: Medicare HMO | Admitting: Family Medicine

## 2021-12-03 ENCOUNTER — Other Ambulatory Visit: Payer: Self-pay

## 2021-12-03 VITALS — BP 100/62 | HR 87 | Temp 97.5°F | Wt 138.0 lb

## 2021-12-03 DIAGNOSIS — Z1159 Encounter for screening for other viral diseases: Secondary | ICD-10-CM

## 2021-12-03 DIAGNOSIS — D509 Iron deficiency anemia, unspecified: Secondary | ICD-10-CM | POA: Diagnosis not present

## 2021-12-03 NOTE — Progress Notes (Signed)
   Subjective:    Patient ID: Tony Mcmillan, male    DOB: 1941-01-17, 80 y.o.   MRN: [id number]  HPI He is here for recheck on his hemoglobin.  He did have evidence of microcytic anemia.  He has been on iron supplementation and not having any difficulty from that.  He has also had a recent colonoscopy.  Otherwise he has no concerns or complaints. Review of record indicates need for follow-up on hepatitis C  Review of Systems     Objective:   Physical Exam Alert and in no distress otherwise not examined       Assessment & Plan:  Microcytic anemia - Plan: CBC with Differential/Platelet  Need for hepatitis C screening test - Plan: Hepatitis C antibody

## 2021-12-04 LAB — CBC WITH DIFFERENTIAL/PLATELET
Basophils Absolute: 0 10*3/uL (ref 0.0–0.2)
Basos: 1 %
EOS (ABSOLUTE): 0.1 10*3/uL (ref 0.0–0.4)
Eos: 2 %
Hematocrit: 42.1 % (ref 37.5–51.0)
Hemoglobin: 13.6 g/dL (ref 13.0–17.7)
Immature Grans (Abs): 0 10*3/uL (ref 0.0–0.1)
Immature Granulocytes: 0 %
Lymphocytes Absolute: 0.7 10*3/uL (ref 0.7–3.1)
Lymphs: 16 %
MCH: 26.5 pg — ABNORMAL LOW (ref 26.6–33.0)
MCHC: 32.3 g/dL (ref 31.5–35.7)
MCV: 82 fL (ref 79–97)
Monocytes Absolute: 0.7 10*3/uL (ref 0.1–0.9)
Monocytes: 16 %
Neutrophils Absolute: 2.9 10*3/uL (ref 1.4–7.0)
Neutrophils: 65 %
Platelets: 262 10*3/uL (ref 150–450)
RBC: 5.13 x10E6/uL (ref 4.14–5.80)
RDW: 16.1 % — ABNORMAL HIGH (ref 11.6–15.4)
WBC: 4.4 10*3/uL (ref 3.4–10.8)

## 2021-12-04 LAB — HEPATITIS C ANTIBODY: Hep C Virus Ab: <0.1 s/co ratio (ref 0.0–0.9)

## 2021-12-07 ENCOUNTER — Ambulatory Visit (INDEPENDENT_AMBULATORY_CARE_PROVIDER_SITE_OTHER): Payer: Medicare HMO

## 2021-12-07 ENCOUNTER — Other Ambulatory Visit: Payer: Self-pay

## 2021-12-07 VITALS — BP 102/70 | HR 71 | Temp 97.4°F | Ht 62.0 in | Wt 137.6 lb

## 2021-12-07 DIAGNOSIS — Z0000 Encounter for general adult medical examination without abnormal findings: Secondary | ICD-10-CM

## 2021-12-07 NOTE — Progress Notes (Signed)
This visit occurred during the SARS-CoV-2 public health emergency.  Safety protocols were in place, including screening questions prior to the visit, additional usage of staff PPE, and extensive cleaning of exam room while observing appropriate contact time as indicated for disinfecting solutions.  Subjective:   Tony Mcmillan is a 80 y.o. male who presents for Medicare Annual/Subsequent preventive examination.  Review of Systems     Cardiac Risk Factors include: advanced age (>37men, >47 women);male gender     Objective:    Today's Vitals   12/07/21 1151 12/07/21 1156  BP: 102/70   Pulse: 71   Temp: (!) 97.4 F (36.3 C)   TempSrc: Oral   SpO2: 96%   Weight: 137 lb 9.6 oz (62.4 kg)   Height: 5\' 2"  (1.575 m)   PainSc:  3    Body mass index is 25.17 kg/m.  Advanced Directives 12/07/2021 01/13/2017 09/23/2016 02/12/2016 01/09/2016 12/05/2015 08/10/2015  Does Patient Have a Medical Advance Directive? Yes Yes Yes Yes Yes No No  Type of Advance Directive Out of facility DNR (pink MOST or yellow form) [place];Living will Healthcare Power of [place];Living will [place] - -  Does patient want to make changes to medical advance directive? - No - Patient declined - - - - -  Copy of [place] in Chart? - No - copy requested - - - - -  Would patient like information on creating a medical advance directive? - - - - - Yes - Educational materials given No - patient declined information    Current Medications (verified) Outpatient Encounter Medications as of 12/07/2021  Medication Sig   albuterol (VENTOLIN HFA) 108 (90 Base) MCG/ACT inhaler Inhale 2 puffs into the lungs every 6 (six) hours as needed for wheezing or shortness of breath.   Ascorbic Acid (VITAMIN C PO) Take 1 tablet by mouth daily.   atorvastatin (LIPITOR) 40 MG tablet TAKE 1 TABLET BY MOUTH EVERY DAY   Calcium Carb-Cholecalciferol  (CALCIUM-VITAMIN D3) 600-400 MG-UNIT TABS Take 1 tablet by mouth 2 (two) times daily.   Glycopyrrolate-Formoterol (BEVESPI AEROSPHERE) 9-4.8 MCG/ACT AERO Inhale 2 puffs into the lungs in the morning and at bedtime.   inFLIXimab (REMICADE IV) Inject 100 mg into the vein as needed (for arthrithis).   isosorbide mononitrate (IMDUR) 30 MG 24 hr tablet Take 0.5 tablets (15 mg total) by mouth daily.   leflunomide (ARAVA) 20 MG tablet 1 tablet   metoprolol succinate (TOPROL-XL) 25 MG 24 hr tablet TAKE 1 TABLET BY MOUTH EVERYDAY AT BEDTIME   MYRBETRIQ 50 MG TB24 tablet Take 50 mg by mouth daily.    nitroGLYCERIN (NITROSTAT) 0.4 MG SL tablet PLACE 1 TABLET UNDER THE TONGUE EVERY 5 MINUTES AS NEEDED FOR CHEST PAIN.   pantoprazole (PROTONIX) 40 MG tablet TAKE 1 TABLET BY MOUTH EVERY DAY   amoxicillin-clavulanate (AUGMENTIN) 875-125 MG tablet Take 1 tablet by mouth 2 (two) times daily. (Patient not taking: Reported on 11/08/2021)   aspirin EC 81 MG tablet Take 1 tablet (81 mg total) by mouth daily. (Patient not taking: Reported on 11/08/2021)   azithromycin (ZITHROMAX) 250 MG tablet Take 2 pills today than 1 pill everyday X 4 days till all. (Patient not taking: Reported on 11/08/2021)   predniSONE (DELTASONE) 10 MG tablet 4 tabs for 2 days, then 3 tabs for 2 days, 2 tabs for 2 days, then 1 tab for 2 days, then stop (Patient not taking: Reported on 11/08/2021)  No facility-administered encounter medications on file as of 12/07/2021.    Allergies (verified) Patient has no known allergies.   History: Past Medical History:  Diagnosis Date   3-vessel coronary artery disease 03/10/2020   Allergy    Anemia    Aortic valve calcification 03/10/2020   Arthritis    Asymptomatic microscopic hematuria 04/03/2016   Cystoscopy 02/207 Dr. Pilar [facility].     Atypical chest pain    a. 09/2016 MV: EF 63%, normal perfusion.   Benign prostatic hyperplasia 04/03/2016   Asymptomatic. Normal PSA and exam by  Dr. Pilar [facility]vis [facility] 01/2016.     Bilateral carotid bruits 02/09/2019   Bladder wall thickening 03/10/2020   CAD (coronary artery disease)    Coronary artery calcification on Chest CT in 09/2016 and 01/2019 // Myoview 10/17: normal perfusion // Myoview 01/2019: no ischemia or infarction, EF 58 // Coronary CTA 03/2020: Calcium Score 1067; non-obstructive CAD [oLM 1-24, LAD ost 25049, mid and dist 1-24; D1 1-24; LCx prox and mid 1-24, OM1 and OM2 25-49, RCA and PLB 1-24; PLA 1-24]    Carotid bruit    Carotid Dopplers 01/2019: no ICA stenosis bilaterally    Colonic polyp 02/23/2016   COVID-19    DDD (degenerative disc disease), 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 L4-5 and A6-T0    Diastolic dysfunction    a. 2015 Echo: EF >55%;  b. 09/2016 Echo: EF 55-60%, no rwma, Gr1 DD, triv AI/MR, mild TR, PASP 93mmHg.   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   On CT 01/2016    Palpitations    Echo 10/17: Mild focal basal septal hypertrophy, EF 55-60, no RWMA, Gr 1 DD, trivial MR, normal RVSF, mild TR, PASP 28 // Event monitor 01/2019: NSR, PACs   Prediabetes 10/29/2018   Pulmonary emphysema ([place]) 12/18/2016   Renal cyst, left 04/03/2016   On CT, benign    Rheumatoid arthritis ([place])    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 this was in [place]     both eyes    COLONOSCOPY     COLOSTOMY REVERSAL     had infection on buttocks and had to have a skin graft- had colostomy to help area stay clean and heal   HERNIA REPAIR     POLYPECTOMY     SPINE SURGERY     Family History  Problem Relation Age of Onset   Hypertension Mother    Hypertension Father    Diabetes Father    Heart disease Father        stents   Hyperlipidemia Sister    Diabetes Sister    Stroke Brother    Diabetes Brother    Heart disease Brother        stents   Colon polyps Neg Hx    Esophageal cancer Neg Hx    Stomach cancer Neg Hx  Rectal cancer Neg Hx    Colon cancer Neg Hx    Social History   Socioeconomic History   Marital status: Widowed    Spouse name: Not on file   Number of children: Not on file   Years of education: Not on file   Highest education level: Not on file  Occupational History   Not on file  Tobacco Use   Smoking status: Former    Packs/day: 1.00    Years: 17.00    Pack years: 17.00    Types: Cigarettes    Quit date: 12/23/1993    Years since quitting: 27.9   Smokeless tobacco: Never  Vaping Use   Vaping Use: Never used  Substance and Sexual Activity   Alcohol use: No    Alcohol/week: 0.0 standard drinks   Drug use: No   Sexual activity: Yes  Other Topics Concern   Not on file  Social History Narrative   Retired. Lives alone.    Social Determinants of Health   Financial Resource Strain: Low Risk    Difficulty of Paying Living Expenses: Not hard at all  Food Insecurity: No Food Insecurity   Worried About Charity fundraiser in the Last Year: Never true   [place] in the Last Year: Never true  Transportation Needs: No Transportation Needs   Lack of Transportation (Medical): No   Lack of Transportation (Non-Medical): No  Physical Activity: Inactive   Days of Exercise per Week: 0 days   Minutes of Exercise per Session: 0 min  Stress: No Stress Concern Present   Feeling of Stress : Not at all  Social Connections: Not on  file    Tobacco Counseling Counseling given: Not Answered   Clinical Intake:  Pre-visit preparation completed: Yes  Pain : 0-10 Pain Score: 3  Pain Type: Chronic pain Pain Location: Generalized Pain Descriptors / Indicators: Aching Pain Onset: More than a month ago Pain Frequency: Constant     Nutritional Status: BMI 25 -29 Overweight Nutritional Risks: None Diabetes: No  How often do you need to have someone help you when you read instructions, pamphlets, or other written materials from your doctor or pharmacy?: 1 - Never What is the last grade level you completed in school?: 12th grade  Diabetic? no  Interpreter Needed?: No  Information entered by :: NAllen LPN   Activities of Daily Living In your present state of health, do you have any difficulty performing the following activities: 12/07/2021  Hearing? N  Vision? N  Difficulty concentrating or making decisions? N  Walking or climbing stairs? N  Dressing or bathing? N  Doing errands, shopping? N  Preparing Food and eating ? N  Using the Toilet? N  In the past six months, have you accidently leaked urine? N  Do you have problems with loss of bowel control? N  Managing your Medications? N  Managing your Finances? N  Housekeeping or managing your Housekeeping? N  Some recent data might be hidden    Patient Care Team: Davy Pique as PCP - General (Family Medicine) Freada Bergeron, MD as PCP - Cardiology (Cardiology) Sharmon Revere as Physician Assistant (Cardiology) Brand Males, MD as Consulting Physician (Pulmonary Disease)  Indicate any recent Medical Services you may have received from other than [facility] providers in the past year (date may be approximate).     Assessment:   This is a routine wellness examination for [facility].  Hearing/Vision screen Vision Screening -  Comments:: Regular eye exam, [facility]  Dietary issues and exercise activities  discussed: Current Exercise Habits: The patient does not participate in regular exercise at present   Goals Addressed             This Visit's Progress    Patient Stated       12/07/2021, no goals       Depression Screen PHQ 2/9 Scores 12/07/2021 04/09/2021 09/06/2020 04/07/2020 08/03/2018 06/12/2018 04/23/2017  PHQ - 2 Score 0 0 0 0 0 0 0    Fall Risk Fall Risk  12/07/2021 04/09/2021 09/06/2020 04/07/2020 11/16/2019  Falls in the past year? 0 0 0 0 0  Comment - - - - Emmi Telephone Survey: data to providers prior to load  Number falls in past yr: - 0 0 0 -  Injury with Fall? - 0 0 - -  Risk for fall due to : Medication side effect No Fall Risks - - -  Risk for fall due to: Comment - - - - -  Follow up Falls evaluation completed;Education provided;Falls prevention discussed Falls evaluation completed - - -    FALL RISK PREVENTION PERTAINING TO THE HOME:  Any stairs in or around the home? No  If so, are there any without handrails?  N/a Home free of loose throw rugs in walkways, pet beds, electrical cords, etc? Yes  Adequate lighting in your home to reduce risk of falls? Yes   ASSISTIVE DEVICES UTILIZED TO PREVENT FALLS:  Life alert? No  Use of a cane, walker or w/c? Yes  Grab bars in the bathroom? No  Shower chair or bench in shower? Yes  Elevated toilet seat or a handicapped toilet? No   TIMED UP AND GO:  Was the test performed? No .    Gait slow and steady with assistive device  Cognitive Function:     6CIT Screen 12/07/2021  What Year? 0 points  What month? 0 points  What time? 0 points  Count back from 20 0 points  Months in reverse 4 points  Repeat phrase 6 points  Total Score 10    Immunizations Immunization History  Administered Date(s) Administered   Influenza, High Dose Seasonal PF 07/23/2017, 08/05/2019, 08/30/2020, 08/22/2021   Influenza,inj,Quad PF,6+ Mos 08/01/2017   Influenza-Unspecified 10/30/2015, 09/13/2016, 07/30/2018, 08/05/2019    PFIZER(Purple Top)SARS-COV-2 Vaccination 01/28/2020, 02/18/2020, 09/19/2020   PNEUMOCOCCAL CONJUGATE-20 08/17/2021   Pfizer Covid-19 Vaccine Bivalent Booster 69yrs & up 11/05/2021   Pneumococcal Conjugate-13 12/05/2015   Pneumococcal Polysaccharide-23 04/23/2017   Tdap 12/09/2015   Zoster Recombinat (Shingrix) 10/26/2021   Zoster, Live 12/09/2015    TDAP status: Up to date  Flu Vaccine status: Up to date  Pneumococcal vaccine status: Up to date  Covid-19 vaccine status: Information provided on how to obtain vaccines.   Qualifies for Shingles Vaccine? Yes   Zostavax completed Yes   Shingrix Completed?: needs second dose  Screening Tests Health Maintenance  Topic Date Due   Zoster Vaccines- Shingrix (2 of 2) 12/21/2021   TETANUS/TDAP  12/08/2025   Pneumonia Vaccine 16+ Years old  Completed   INFLUENZA VACCINE  Completed   COVID-19 Vaccine  Completed   Hepatitis C Screening  Completed   HPV VACCINES  Aged Out   COLONOSCOPY (Pts 45-49yrs Insurance coverage will need to be confirmed)  Discontinued    Health Maintenance  There are no preventive care reminders to display for this patient.   Colorectal cancer screening: No longer required.   Lung Cancer  Screening: (Low Dose CT Chest recommended if Age 66-80 years, 30 pack-year currently smoking OR have quit w/in 15years.) does not qualify.   Lung Cancer Screening Referral: no  Additional Screening:  Hepatitis C Screening: does qualify; Completed 12/03/2021  Vision Screening: Recommended annual ophthalmology exams for early detection of glaucoma and other disorders of the eye. Is the patient up to date with their annual eye exam?  Yes  Who is the provider or what is the name of the office in which the patient attends annual eye exams? [facility] If pt is not established with a provider, would they like to be referred to a provider to establish care? No .   Dental Screening: Recommended annual dental exams  for proper oral hygiene  Community Resource Referral / Chronic Care Management: CRR required this visit?  No   CCM required this visit?  No      Plan:     I have personally reviewed and noted the following in the patients chart:   Medical and social history Use of alcohol, tobacco or illicit drugs  Current medications and supplements including opioid prescriptions. Patient is not currently taking opioid prescriptions. Functional ability and status Nutritional status Physical activity Advanced directives List of other physicians Hospitalizations, surgeries, and ER visits in previous 12 months Vitals Screenings to include cognitive, depression, and falls Referrals and appointments  In addition, I have reviewed and discussed with patient certain preventive protocols, quality metrics, and best practice recommendations. A written personalized care plan for preventive services as well as general preventive health recommendations were provided to patient.     Kellie Simmering, LPN   46/19/0122   Nurse Notes: none

## 2021-12-07 NOTE — Patient Instructions (Signed)
Tony Mcmillan , Thank you for taking time to come for your Medicare Wellness Visit. I appreciate your ongoing commitment to your health goals. Please review the following plan we discussed and let me know if I can assist you in the future.   Screening recommendations/referrals: Colonoscopy: not required Recommended yearly ophthalmology/optometry visit for glaucoma screening and checkup Recommended yearly dental visit for hygiene and checkup  Vaccinations: Influenza vaccine: completed 08/22/2021 Pneumococcal vaccine: completed 08/17/2021 Tdap vaccine: completed 12/09/2015, due 12/08/2025 Shingles vaccine: needs second dose   Covid-19:  10/26/2021, 09/19/2020, 02/18/2020, 01/28/2020  Advanced directives: copy in chart  Conditions/risks identified: none  Next appointment: Follow up in one year for your annual wellness visit.   Preventive Care 79 Years and Older, Male Preventive care refers to lifestyle choices and visits with your health care provider that can promote health and wellness. What does preventive care include? A yearly physical exam. This is also called an annual well check. Dental exams once or twice a year. Routine eye exams. Ask your health care provider how often you should have your eyes checked. Personal lifestyle choices, including: Daily care of your teeth and gums. Regular physical activity. Eating a healthy diet. Avoiding tobacco and drug use. Limiting alcohol use. Practicing safe sex. Taking low doses of aspirin every day. Taking vitamin and mineral supplements as recommended by your health care provider. What happens during an annual well check? The services and screenings done by your health care provider during your annual well check will depend on your age, overall health, lifestyle risk factors, and family history of disease. Counseling  Your health care provider may ask you questions about your: Alcohol use. Tobacco use. Drug use. Emotional well-being. Home  and relationship well-being. Sexual activity. Eating habits. History of falls. Memory and ability to understand (cognition). Work and work Statistician. Screening  You may have the following tests or measurements: Height, weight, and BMI. Blood pressure. Lipid and cholesterol levels. These may be checked every 5 years, or more frequently if you are over 46 years old. Skin check. Lung cancer screening. You may have this screening every year starting at age 87 if you have a 30-pack-year history of smoking and currently smoke or have quit within the past 15 years. Fecal occult blood test (FOBT) of the stool. You may have this test every year starting at age 7. Flexible sigmoidoscopy or colonoscopy. You may have a sigmoidoscopy every 5 years or a colonoscopy every 10 years starting at age 64. Prostate cancer screening. Recommendations will vary depending on your family history and other risks. Hepatitis C blood test. Hepatitis B blood test. Sexually transmitted disease (STD) testing. Diabetes screening. This is done by checking your blood sugar (glucose) after you have not eaten for a while (fasting). You may have this done every 1-3 years. Abdominal aortic aneurysm (AAA) screening. You may need this if you are a current or former smoker. Osteoporosis. You may be screened starting at age 12 if you are at high risk. Talk with your health care provider about your test results, treatment options, and if necessary, the need for more tests. Vaccines  Your health care provider may recommend certain vaccines, such as: Influenza vaccine. This is recommended every year. Tetanus, diphtheria, and acellular pertussis (Tdap, Td) vaccine. You may need a Td booster every 10 years. Zoster vaccine. You may need this after age 44. Pneumococcal 13-valent conjugate (PCV13) vaccine. One dose is recommended after age 1. Pneumococcal polysaccharide (PPSV23) vaccine. One dose is recommended after  age 41. Talk to  your health care provider about which screenings and vaccines you need and how often you need them. This information is not intended to replace advice given to you by your health care provider. Make sure you discuss any questions you have with your health care provider. Document Released: 01/05/2016 Document Revised: 08/28/2016 Document Reviewed: 10/10/2015 Elsevier Interactive Patient Education  2017 [place] Prevention in the Home Falls can cause injuries. They can happen to people of all ages. There are many things you can do to make your home safe and to help prevent falls. What can I do on the outside of my home? Regularly fix the edges of walkways and driveways and fix any cracks. Remove anything that might make you trip as you walk through a door, such as a raised step or threshold. Trim any bushes or trees on the path to your home. Use bright outdoor lighting. Clear any walking paths of anything that might make someone trip, such as rocks or tools. Regularly check to see if handrails are loose or broken. Make sure that both sides of any steps have handrails. Any raised decks and porches should have guardrails on the edges. Have any leaves, snow, or ice cleared regularly. Use sand or salt on walking paths during winter. Clean up any spills in your garage right away. This includes oil or grease spills. What can I do in the bathroom? Use night lights. Install grab bars by the toilet and in the tub and shower. Do not use towel bars as grab bars. Use non-skid mats or decals in the tub or shower. If you need to sit down in the shower, use a plastic, non-slip stool. Keep the floor dry. Clean up any water that spills on the floor as soon as it happens. Remove soap buildup in the tub or shower regularly. Attach bath mats securely with double-sided non-slip rug tape. Do not have throw rugs and other things on the floor that can make you trip. What can I do in the bedroom? Use  night lights. Make sure that you have a light by your bed that is easy to reach. Do not use any sheets or blankets that are too big for your bed. They should not hang down onto the floor. Have a firm chair that has side arms. You can use this for support while you get dressed. Do not have throw rugs and other things on the floor that can make you trip. What can I do in the kitchen? Clean up any spills right away. Avoid walking on wet floors. Keep items that you use a lot in easy-to-reach places. If you need to reach something above you, use a strong step stool that has a grab bar. Keep electrical cords out of the way. Do not use floor polish or wax that makes floors slippery. If you must use wax, use non-skid floor wax. Do not have throw rugs and other things on the floor that can make you trip. What can I do with my stairs? Do not leave any items on the stairs. Make sure that there are handrails on both sides of the stairs and use them. Fix handrails that are broken or loose. Make sure that handrails are as long as the stairways. Check any carpeting to make sure that it is firmly attached to the stairs. Fix any carpet that is loose or worn. Avoid having throw rugs at the top or bottom of the stairs. If you do  have throw rugs, attach them to the floor with carpet tape. Make sure that you have a light switch at the top of the stairs and the bottom of the stairs. If you do not have them, ask someone to add them for you. What else can I do to help prevent falls? Wear shoes that: Do not have high heels. Have rubber bottoms. Are comfortable and fit you well. Are closed at the toe. Do not wear sandals. If you use a stepladder: Make sure that it is fully opened. Do not climb a closed stepladder. Make sure that both sides of the stepladder are locked into place. Ask someone to hold it for you, if possible. Clearly mark and make sure that you can see: Any grab bars or handrails. First and last  steps. Where the edge of each step is. Use tools that help you move around (mobility aids) if they are needed. These include: Canes. Walkers. Scooters. Crutches. Turn on the lights when you go into a dark area. Replace any light bulbs as soon as they burn out. Set up your furniture so you have a clear path. Avoid moving your furniture around. If any of your floors are uneven, fix them. If there are any pets around you, be aware of where they are. Review your medicines with your doctor. Some medicines can make you feel dizzy. This can increase your chance of falling. Ask your doctor what other things that you can do to help prevent falls. This information is not intended to replace advice given to you by your health care provider. Make sure you discuss any questions you have with your health care provider. Document Released: 10/05/2009 Document Revised: 05/16/2016 Document Reviewed: 01/13/2015 Elsevier Interactive Patient Education  2017 [employer].

## 2021-12-13 ENCOUNTER — Ambulatory Visit: Payer: Medicare HMO | Admitting: Medical

## 2022-01-06 ENCOUNTER — Emergency Department (HOSPITAL_BASED_OUTPATIENT_CLINIC_OR_DEPARTMENT_OTHER): Payer: Medicare HMO | Admitting: Radiology

## 2022-01-06 ENCOUNTER — Encounter (HOSPITAL_BASED_OUTPATIENT_CLINIC_OR_DEPARTMENT_OTHER): Payer: Self-pay | Admitting: Emergency Medicine

## 2022-01-06 ENCOUNTER — Emergency Department (HOSPITAL_BASED_OUTPATIENT_CLINIC_OR_DEPARTMENT_OTHER)
Admission: EM | Admit: 2022-01-06 | Discharge: 2022-01-06 | Disposition: A | Discharge status: Home / Self Care | Payer: Medicare HMO | Attending: Emergency Medicine | Admitting: Emergency Medicine

## 2022-01-06 DIAGNOSIS — R0789 Other chest pain: Secondary | ICD-10-CM | POA: Diagnosis present

## 2022-01-06 DIAGNOSIS — R079 Chest pain, unspecified: Secondary | ICD-10-CM

## 2022-01-06 DIAGNOSIS — I2510 Atherosclerotic heart disease of native coronary artery without angina pectoris: Secondary | ICD-10-CM | POA: Insufficient documentation

## 2022-01-06 DIAGNOSIS — Z79899 Other long term (current) drug therapy: Secondary | ICD-10-CM | POA: Insufficient documentation

## 2022-01-06 DIAGNOSIS — Z7982 Long term (current) use of aspirin: Secondary | ICD-10-CM | POA: Diagnosis not present

## 2022-01-06 LAB — CBC WITH DIFFERENTIAL/PLATELET
Abs Immature Granulocytes: 0.01 10*3/uL (ref 0.00–0.07)
Basophils Absolute: 0 10*3/uL (ref 0.0–0.1)
Basophils Relative: 0 %
Eosinophils Absolute: 0.1 10*3/uL (ref 0.0–0.5)
Eosinophils Relative: 2 %
HCT: 43.5 % (ref 39.0–52.0)
Hemoglobin: 14.2 g/dL (ref 13.0–17.0)
Immature Granulocytes: 0 %
Lymphocytes Relative: 22 %
Lymphs Abs: 1 10*3/uL (ref 0.7–4.0)
MCH: 26.8 pg (ref 26.0–34.0)
MCHC: 32.6 g/dL (ref 30.0–36.0)
MCV: 82.2 fL (ref 80.0–100.0)
Monocytes Absolute: 0.6 10*3/uL (ref 0.1–1.0)
Monocytes Relative: 13 %
Neutro Abs: 2.9 10*3/uL (ref 1.7–7.7)
Neutrophils Relative %: 63 %
Platelets: 229 10*3/uL (ref 150–400)
RBC: 5.29 MIL/uL (ref 4.22–5.81)
RDW: 15.7 % — ABNORMAL HIGH (ref 11.5–15.5)
WBC: 4.6 10*3/uL (ref 4.0–10.5)
nRBC: 0 % (ref 0.0–0.2)

## 2022-01-06 LAB — COMPREHENSIVE METABOLIC PANEL
ALT: 15 U/L (ref 0–44)
AST: 21 U/L (ref 15–41)
Albumin: 4.2 g/dL (ref 3.5–5.0)
Alkaline Phosphatase: 55 U/L (ref 38–126)
Anion gap: 9 (ref 5–15)
BUN: 15 mg/dL (ref 8–23)
CO2: 25 mmol/L (ref 22–32)
Calcium: 9.5 mg/dL (ref 8.9–10.3)
Chloride: 104 mmol/L (ref 98–111)
Creatinine, Ser: 1.18 mg/dL (ref 0.61–1.24)
GFR, Estimated: >60 mL/min (ref >60)
Glucose, Bld: 105 mg/dL — ABNORMAL HIGH (ref 70–99)
Potassium: 4.4 mmol/L (ref 3.5–5.1)
Sodium: 138 mmol/L (ref 135–145)
Total Bilirubin: 0.8 mg/dL (ref 0.3–1.2)
Total Protein: 7.6 g/dL (ref 6.5–8.1)

## 2022-01-06 LAB — TROPONIN I (HIGH SENSITIVITY)
Troponin I (High Sensitivity): 6 ng/L (ref <18)
Troponin I (High Sensitivity): 6 ng/L (ref <18)

## 2022-01-06 MED ORDER — ISOSORBIDE MONONITRATE 15 MG HALF TABLET: 15.0000 mg | ORAL_TABLET | Freq: Every day | ORAL | Status: DC

## 2022-01-06 NOTE — ED Notes (Signed)
EMT-P provided AVS using Teachback Method. Patient verbalizes understanding of Discharge Instructions. Opportunity for Questioning and Answers were provided by EMT-P. Patient Discharged from ED.  ? ?

## 2022-01-06 NOTE — ED Triage Notes (Addendum)
Pt reports sharp "cold" pain in left lower chest this morning at approx 10am.  Took 1 Nitro SL and felt relief.  Denies pain at present.

## 2022-01-06 NOTE — ED Notes (Signed)
Pt states he did not take his BP medication this morning.

## 2022-01-06 NOTE — Discharge Instructions (Addendum)
You were seen in today for evaluation of your chest pain.  Your work-up was unremarkable.  I spoke with your cardiology team who recommended you keep your appointment on January 30 for follow-up.  If you have any chest pain again, please take your nitro as advised.  If you have any worsening chest pain, shortness of breath, nausea, vomiting, diaphoresis, lightheadedness, or fainting, please return to the emergency department for re-evaluation.

## 2022-01-06 NOTE — ED Notes (Signed)
Dr Roslynn Amble in room w/pt now.

## 2022-01-06 NOTE — ED Provider Notes (Signed)
[facility] EMERGENCY DEPT Provider Note   CSN: [id number] Arrival date & time: 01/06/22  1137     History Chief Complaint  Patient presents with   Chest Pain    Tony Mcmillan is a 81 y.o. male presents emergency department for evaluation of chest pain today.  Patient reports about 1 to 2 hours prior to arrival, he felt a cold sensation in the left side of his chest that lasted 1 to 2 minutes.  Then, he felt a sharp pain.  He took 1 nitro and it resolved his chest pain although a few minutes later he was experiencing chest tightness.  He attributes the chest tightness to his ongoing pulmonary problems.  He denies any cough, shortness of breath, nausea, diaphoresis, or lightheadedness.  Patient reports he did feel hot.  Medical history includes COPD, emphysema, coronary artery disease, rheumatoid arthritis.  Former smoker.  No known drug allergies.   Chest Pain Associated symptoms: no abdominal pain, no cough, no diaphoresis, no fever, no nausea, no palpitations, no shortness of breath and no vomiting       Home Medications Prior to Admission medications   Medication Sig Start Date End Date Taking? Authorizing Provider  albuterol (VENTOLIN HFA) 108 (90 Base) MCG/ACT inhaler Inhale 2 puffs into the lungs every 6 (six) hours as needed for wheezing or shortness of breath. 11/08/21   Brand Males, MD  amoxicillin-clavulanate (AUGMENTIN) 875-125 MG tablet Take 1 tablet by mouth 2 (two) times daily. Patient not taking: Reported on 11/08/2021 10/02/21   Girtha Rm, PA-C  Ascorbic Acid (VITAMIN C PO) Take 1 tablet by mouth daily.    [provider]  aspirin EC 81 MG tablet Take 1 tablet (81 mg total) by mouth daily. Patient not taking: Reported on 11/08/2021 03/15/20   Richardson Dopp T, PA-C  atorvastatin (LIPITOR) 40 MG tablet TAKE 1 TABLET BY MOUTH EVERY DAY 04/23/21   Josue Hector, MD  azithromycin (ZITHROMAX) 250 MG tablet Take 2 pills today than 1 pill  everyday X 4 days till all. Patient not taking: Reported on 11/08/2021 09/26/21   Richardson Dopp T, PA-C  Calcium Carb-Cholecalciferol (CALCIUM-VITAMIN D3) 600-400 MG-UNIT TABS Take 1 tablet by mouth 2 (two) times daily.    [provider]  Glycopyrrolate-Formoterol (BEVESPI AEROSPHERE) 9-4.8 MCG/ACT AERO Inhale 2 puffs into the lungs in the morning and at bedtime. 11/08/21   Brand Males, MD  inFLIXimab (REMICADE IV) Inject 100 mg into the vein as needed (for arthrithis).    [provider]  isosorbide mononitrate (IMDUR) 30 MG 24 hr tablet Take 0.5 tablets (15 mg total) by mouth daily. 09/03/21   Burnell Blanks, MD  leflunomide (ARAVA) 20 MG tablet 1 tablet    [provider]  metoprolol succinate (TOPROL-XL) 25 MG 24 hr tablet TAKE 1 TABLET BY MOUTH EVERYDAY AT BEDTIME 09/17/21   Weaver, Scott T, PA-C  MYRBETRIQ 50 MG TB24 tablet Take 50 mg by mouth daily.  02/07/19   [provider]  nitroGLYCERIN (NITROSTAT) 0.4 MG SL tablet PLACE 1 TABLET UNDER THE TONGUE EVERY 5 MINUTES AS NEEDED FOR CHEST PAIN. 08/29/21   Richardson Dopp T, PA-C  pantoprazole (PROTONIX) 40 MG tablet TAKE 1 TABLET BY MOUTH EVERY DAY 08/09/21   Ladene Artist, MD  predniSONE (DELTASONE) 10 MG tablet 4 tabs for 2 days, then 3 tabs for 2 days, 2 tabs for 2 days, then 1 tab for 2 days, then stop Patient not taking: Reported on 11/08/2021  09/27/21   Parrett, Fonnie Mu, NP      Allergies    Patient has no known allergies.    Review of Systems   Review of Systems  Constitutional:  Negative for chills, diaphoresis and fever.  Respiratory:  Negative for cough and shortness of breath.   Cardiovascular:  Positive for chest pain. Negative for palpitations.  Gastrointestinal:  Negative for abdominal pain, nausea and vomiting.  Neurological:  Negative for light-headedness.   Physical Exam Updated Vital Signs BP 117/79    Pulse 69    Temp 98 F (36.7 C)    Resp 17    Ht 5\' 2"  (1.575 m)     Wt 62.6 kg    SpO2 95%    BMI 25.24 kg/m  Physical Exam Constitutional:      General: He is not in acute distress.    Appearance: Normal appearance. He is not ill-appearing, toxic-appearing or diaphoretic.  HENT:     Head: Normocephalic and atraumatic.  Eyes:     General: No scleral icterus. Cardiovascular:     Rate and Rhythm: Normal rate and regular rhythm.     Heart sounds: Normal heart sounds.     Comments: Radial, DP, PT pulses intact bilaterally. Pulmonary:     Effort: Pulmonary effort is normal.     Breath sounds: Normal breath sounds.     Comments: Lungs are clear to auscultation bilaterally.  On auscultation, the patient is moving good air in all lung fields.  No respiratory distress, accessory muscle use, tripoding, nasal flaring, or cyanosis present.  Patient satting 95% on room air. Chest:     Chest wall: No mass, deformity, tenderness or crepitus.     Comments: No overlying skin changes, erythema, ecchymosis, rash, or abrasion noted.  No tenderness to chest wall. Abdominal:     General: Abdomen is flat. Bowel sounds are normal.     Palpations: Abdomen is soft.  Musculoskeletal:        General: No deformity.     Cervical back: Normal range of motion.     Right lower leg: No edema.     Left lower leg: No edema.  Skin:    General: Skin is warm and dry.  Neurological:     General: No focal deficit present.     Mental Status: He is alert. Mental status is at baseline.     Cranial Nerves: No cranial nerve deficit.    ED Results / Procedures / Treatments   Labs (all labs ordered are listed, but only abnormal results are displayed) Labs Reviewed  CBC WITH DIFFERENTIAL/PLATELET - Abnormal; Notable for the following components:      Result Value   RDW 15.7 (*)    All other components within normal limits  COMPREHENSIVE METABOLIC PANEL - Abnormal; Notable for the following components:   Glucose, Bld 105 (*)    All other components within normal limits  TROPONIN I  (HIGH SENSITIVITY)  TROPONIN I (HIGH SENSITIVITY)    EKG EKG Interpretation  Date/Time:  Sunday January 06 2022 11:43:30 EST Ventricular Rate:  100 PR Interval:  130 QRS Duration: 70 QT Interval:  324 QTC Calculation: 417 R Axis:   16 Text Interpretation: Normal sinus rhythm Normal ECG When compared with ECG of 06-May-2018 14:55, Confirmed by Madalyn Rob [phone]) on 01/06/2022 12:33:20 PM  Radiology DG Chest 2 View  Result Date: 01/06/2022 CLINICAL DATA:  Chest pain EXAM: CHEST - 2 VIEW COMPARISON:  09/26/2021 FINDINGS: Cardiac size is within  normal limits. There are no signs of pulmonary edema or focal pulmonary consolidation. There is no pleural effusion or pneumothorax. Right hemidiaphragm is elevated. IMPRESSION: No active cardiopulmonary disease. Electronically Signed   By: Elmer Picker M.D.   On: 01/06/2022 12:05    Procedures Procedures   Medications Ordered in ED Medications - No data to display  ED Course/ Medical Decision Making/ A&P                           Medical Decision Making Amount and/or Complexity of Data Reviewed Labs: ordered. Radiology: ordered.   81 year old male presents the emergency department for evaluation of left sided chest pain that is now improved.  Differential diagnosis includes but is not limited to ACS, MSK, pneumonia, pneumothorax, COPD exacerbation, dissection.  Low suspicion for dissection as the patient has equal radial and pedal/PT pulses as well as the pain only lasted minutes.  On initial presentation, patient was hypertensive at 159/100.  Physical exam is unremarkable.  Surprisingly, patient's lungs are clear to auscultation bilaterally.  Heart is regular rate and rhythm.  Patient is well-appearing and in no acute distress.  Nondiaphoretic.  Chest pain not reproducible.  Equal radial, pedal, and PT pulses.  Cardiac order set placed.  On chart review, the patient has had bouts of left-sided chest pain with normal work-up in  the past and is seeing cardiology for this.  They likened it to his ongoing lung infection.  He has had a coronary CTA on 04/12/21 that showed mild to moderate nonobstructive CAD.  Myoview in 3-22 was low risk.  Cardiology is also not clear on my nitroglycerin improves his symptoms at this time.  The patient reported to me that he did not take his daily morning medications. Will order Metoprolol and Imdur, pharmacist informed me that we do not have Imdur, so patient can just have his Metoprolol.   I independently reviewed and interpreted the patient's labs and imaging.  CMP shows mildly elevated glucose at 105, however no electrolyte abnormalities.  CT shows no leukocytosis or anemia.  Initial troponin was 6 with repeat of 6, delta 0.  Chest x-ray shows no cardiopulmonary process.  EKG shows normal sinus rhythm.  At this time, low suspicion for ACS given the flat troponins and normal EKG.  Low suspicion for pneumonia or pneumothorax given chest x-ray as well as clear to auscultation breath sounds.  Low suspicion for COPD exacerbation given the patient's again reassuring lung sounds and no leukocytosis.  HEAR Score of 4. Given the normal cardiac work-up but patient having episodes of left-sided chest pain again that are relieved with nitro with a negative work-up, cardiology was consulted.  Spoke to Phineas Inches, MD and discussed the case with her. She does not think the patient needs to come in for a cath or see them sooner as he has an appointment in the upcoming week.   I discussed with the patient and family in the room the lab and imaging results.  I recommended that he make sure he goes to his appointment on January 30 for cardiology.  I advised him to take nitro if he was to experience this chest pain again.  Strict return precautions discussed with him.  Patient agrees to plan.  Patient is stable being discharged home in good condition.  I discussed this case with my attending physician who cosigned  this note including patient's presenting symptoms, physical exam, and planned diagnostics and interventions.  Attending physician stated agreement with plan or made changes to plan which were implemented.   Attending physician assessed patient at bedside.  Final Clinical Impression(s) / ED Diagnoses Final diagnoses:  Chest pain, unspecified type    Rx / DC Orders ED Discharge Orders     None         Sherrell Puller, PA-C 01/08/22 2230    Lucrezia Starch, MD 01/10/22 [phone]

## 2022-01-20 DIAGNOSIS — I491 Atrial premature depolarization: Secondary | ICD-10-CM | POA: Insufficient documentation

## 2022-01-20 NOTE — Progress Notes (Signed)
Cardiology Office Note:    Date:  01/21/2022   ID:  Tony Mcmillan, DOB [birth year], MRN [id number]  PCP:  Girtha Rm, PA-C  [facility] Providers Cardiologist:  Freada Bergeron, MD Cardiology APP:  Sharmon Revere    Referring MD: Girtha Rm, [place]   Chief Complaint:  Hospitalization Follow-up (ED visit for chest pain )    Patient Profile: Specialty Problems       Cardiology Problems   Aortic valve calcification   CAD (coronary artery disease)    Coronary artery calcification on Chest CT in 09/2016 and 01/2019 Myoview 10/17: normal perfusion Echocardiogram 10/14/16:  Mild focal basal septal hypertrophy, EF 55-60, no RWMA, Gr 1 DD, trivial MR, normal RVSF, mild TR, PASP 28 Myoview 01/2019: no ischemia or infarction, EF 58 Carotid US 02/15/2019:  No ICA stenosis  Coronary CTA 03/2020: Calcium Score 1067; non-obstructive CAD [oLM 1-24, LAD ost 25-49, mid and dist 1-24; D1 1-24; LCx prox and mid 1-24, OM1 and OM2 25-49, RCA and PLB 1-24; PLA 1-24]  Myoview 03/01/21:  EF 72, inf artifact, normal perfusion, low risk Chest pain:  Rx w Toprol XL, Imdur       Premature atrial contractions    Event Monitor 02/16/2019:  NSR, PACs      Mixed hyperlipidemia     History of Present Illness:   Tony Mcmillan is a 81 y.o. male with the above problem list.  He was last seen in 10/22 with symptoms of chest pain secondary to COPD exacerbation.  He was recently seen in the emergency room 01/06/2022 with chest pain.  EKG demonstrated no acute changes.  High sensitivity troponins were negative.  Chest x-ray was unremarkable.  He returns for follow-up.  He is here alone.  He had a couple of brief sharp chest pains that prompted his visit to the emergency room.  These were nonexertional.  He has not had recurrent symptoms since.  He had no radiating or associated symptoms.  He has not had exertional chest heaviness or tightness.  He has not had pleuritic chest pain.  He has chronic  shortness of breath that is stable without significant change.  He has not had syncope, leg edema.        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 Pilar Jarvis [facility].     Atypical chest pain    a. 09/2016 MV: EF 63%, normal perfusion.   Benign prostatic hyperplasia 04/03/2016   Asymptomatic. Normal PSA and exam by Dr. Pilar Jarvis [facility] 01/2016.     Bilateral carotid bruits 02/09/2019   Bladder wall thickening 03/10/2020   CAD (coronary artery disease)    Coronary artery calcification on Chest CT in 09/2016 and 01/2019 // Myoview 10/17: normal perfusion // Myoview 01/2019: no ischemia or infarction, EF 58 // Coronary CTA 03/2020: Calcium Score 1067; non-obstructive CAD [oLM 1-24, LAD ost 25049, mid and dist 1-24; D1 1-24; LCx prox and mid 1-24, OM1 and OM2 25-49, RCA and PLB 1-24; PLA 1-24]    Carotid bruit    Carotid Dopplers 01/2019: no ICA stenosis bilaterally    Colonic polyp 02/23/2016   COVID-19    DDD (degenerative disc disease), 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 L4-5 and I7-O6    Diastolic dysfunction  a. 2015 Echo: EF >55%;  b. 09/2016 Echo: EF 55-60%, no rwma, Gr1 DD, triv AI/MR, mild TR, PASP 37mHg.   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   On CT 01/2016    Palpitations    Echo 10/17: Mild focal basal septal hypertrophy, EF 55-60, no RWMA, Gr 1 DD, trivial MR, normal RVSF, mild TR, PASP 28 // Event monitor 01/2019: NSR, PACs   Prediabetes  10/29/2018   [place]ulmonary [place]sema ([place] 12/18/2016   Renal cyst, 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   Ascorbic Acid (VITAMIN C PO) Take 1 tablet by mouth daily.   aspirin EC 81 MG tablet Take 1 tablet (81 mg total) by mouth daily.   atorvastatin (LIPITOR) 40 MG tablet TAKE 1 TABLET BY MOUTH EVERY DAY   Calcium Carb-Cholecalciferol (CALCIUM-VITAMIN D3) 600-400 MG-UNIT TABS Take 1 tablet by mouth 2 (two) times daily.   Glycopyrrolate-Formoterol (BEVESPI AEROSPHERE) 9-4.8 MCG/ACT AERO Inhale 2 puffs into the lungs in the morning and at bedtime.   inFLIXimab (REMICADE IV) Inject 100 mg into the vein as needed (for arthrithis).   isosorbide mononitrate (IMDUR) 30 MG 24 hr tablet Take 0.5 tablets (15 mg total) by mouth daily.   leflunomide (ARAVA) 20 MG tablet Take 20 mg by mouth daily.   metoprolol succinate (TOPROL-XL) 25 MG 24 hr tablet TAKE 1 TABLET BY MOUTH EVERYDAY AT BEDTIME   MYRBETRIQ 50 MG TB24 tablet Take 50 mg by mouth daily.    nitroGLYCERIN (NITROSTAT) 0.4 MG SL tablet PLACE 1 TABLET UNDER THE TONGUE EVERY 5 MINUTES AS NEEDED FOR CHEST PAIN.   pantoprazole (PROTONIX) 40 MG tablet TAKE 1 TABLET BY MOUTH EVERY DAY    Allergies:   Patient has no known allergies.   Social History   Tobacco Use   Smoking status: Former    Packs/day: 1.00    Years: 17.00    Pack years: 17.00    Types: Cigarettes    Quit date: 12/23/1993    Years since quitting: 28.0   Smokeless tobacco: Never  Vaping Use   Vaping Use: Never used  Substance Use Topics   Alcohol use: No     Alcohol/week: 0.0 standard drinks   Drug use: No    Family Hx: The patient's family history includes Diabetes in his brother, father, and sister; Heart disease in his brother and father; Hyperlipidemia in his sister; Hypertension in his father and mother; Stroke in his brother. There is no history of Colon polyps, Esophageal cancer, Stomach cancer, Rectal cancer, or Colon cancer.  Review of Systems  Musculoskeletal:  Positive for back pain.    EKGs/Labs/Other Test Reviewed:    EKG:  EKG is  ordered today.  The ekg ordered today demonstrates NSR, HR 77, normal  axis, low voltage, no ST-T wave changes, QTC 414, no change from prior tracing  Recent Labs: 09/26/2021: NT-Pro BNP 66 01/06/2022: ALT 15; BUN 15; Creatinine, Ser 1.18; Hemoglobin 14.2; Platelets 229; Potassium 4.4; Sodium 138   Recent Lipid Panel Recent Labs    02/13/21 0904  CHOL 144  TRIG 121  HDL 63  LDLCALC 60     Risk Assessment/Calculations:         Physical Exam:    VS:  BP 112/60 (BP Location: Right Arm)    Pulse 77    Ht 5' 2"  (1.575 m)    Wt 138 lb 6.4 oz (62.8 kg)    SpO2 96%    BMI 25.31 kg/m     Wt Readings from Last 3 Encounters:  01/21/22 138 lb 6.4 oz (62.8 kg)  01/06/22 138 lb (62.6 kg)  12/07/21 137 lb 9.6 oz (62.4 kg)    Constitutional:      Appearance: Healthy appearance. Not in distress.  Neck:     Vascular: No JVR. JVD normal.  Pulmonary:     Effort: Pulmonary effort is normal.     Breath sounds: No wheezing. No rales.  Cardiovascular:     Normal rate. Regular rhythm. Normal S1. Normal S2.      Murmurs: There is no murmur.  Edema:    Peripheral edema absent.  Abdominal:     Palpations: Abdomen is soft.  Skin:    General: Skin is warm and dry.  Neurological:     General: No focal deficit present.     Mental Status: Alert and oriented to person, place and time.     Cranial Nerves: Cranial nerves are intact.        ASSESSMENT & PLAN:   CAD (coronary artery disease) He has fairly  chronic chest pain.  Coronary CTA in April 2021 with mild to moderate nonobstructive CAD.  Myoview in March 2022 was low risk.  He was recently seen in the emergency room with chest pain.  His symptoms sound noncardiac.  His high sensitivity troponins were normal.  He has not had any recurrent symptoms since that time.  I have recommended that we continue him on his current therapy.  Continue aspirin 81 mg daily, atorvastatin 40 mg daily, isosorbide mononitrate 15 mg daily, metoprolol succinate 25 mg daily.  If he has recurrent symptoms, consider repeating coronary CTA.  F/u in 6 mos.   Mixed hyperlipidemia Continue atorvastatin 40 mg daily.  Arrange fasting CMET, lipids prior to next visit in 6 months  Pulmonary emphysema ([place]) Breathing seems to be stable.  Continue follow-up with pulmonology as planned.         Dispo:  Return in about 6 months (around 07/21/2022) for Routine Follow Up, w/ Richardson Dopp, PA-C.   Medication Adjustments/Labs and Tests Ordered: Current medicines are reviewed at length with the patient today.  Concerns regarding medicines are outlined above.  Tests Ordered: Orders Placed This Encounter  Procedures   Comp Met (CMET)   Lipid Profile   EKG 12-Lead   Medication Changes: No orders of the defined types were placed in this encounter.  Signed, Richardson Dopp, PA-C  01/21/2022 11:31 AM    [facility] [address], [place], [place]  [address] Phone: [phone]; Fax: [phone]

## 2022-01-21 ENCOUNTER — Other Ambulatory Visit: Payer: Self-pay

## 2022-01-21 ENCOUNTER — Ambulatory Visit (INDEPENDENT_AMBULATORY_CARE_PROVIDER_SITE_OTHER): Payer: Medicare HMO | Admitting: Physician Assistant

## 2022-01-21 ENCOUNTER — Encounter: Payer: Self-pay | Admitting: Physician Assistant

## 2022-01-21 ENCOUNTER — Other Ambulatory Visit: Payer: Self-pay | Admitting: Gastroenterology

## 2022-01-21 VITALS — BP 112/60 | HR 77 | Ht 62.0 in | Wt 138.4 lb

## 2022-01-21 DIAGNOSIS — I491 Atrial premature depolarization: Secondary | ICD-10-CM

## 2022-01-21 DIAGNOSIS — I25119 Atherosclerotic heart disease of native coronary artery with unspecified angina pectoris: Secondary | ICD-10-CM

## 2022-01-21 DIAGNOSIS — E782 Mixed hyperlipidemia: Secondary | ICD-10-CM

## 2022-01-21 DIAGNOSIS — J438 Other emphysema: Secondary | ICD-10-CM | POA: Diagnosis not present

## 2022-01-21 DIAGNOSIS — I2510 Atherosclerotic heart disease of native coronary artery without angina pectoris: Secondary | ICD-10-CM

## 2022-01-21 NOTE — Assessment & Plan Note (Addendum)
He has fairly chronic chest pain.  Coronary CTA in April 2021 with mild to moderate nonobstructive CAD.  Myoview in March 2022 was low risk.  He was recently seen in the emergency room with chest pain.  His symptoms sound noncardiac.  His high sensitivity troponins were normal.  He has not had any recurrent symptoms since that time.  I have recommended that we continue him on his current therapy.  Continue aspirin 81 mg daily, atorvastatin 40 mg daily, isosorbide mononitrate 15 mg daily, metoprolol succinate 25 mg daily.  If he has recurrent symptoms, consider repeating coronary CTA.  F/u in 6 mos.

## 2022-01-21 NOTE — Patient Instructions (Signed)
Medication Instructions:   Your physician recommends that you continue on your current medications as directed. Please refer to the Current Medication list given to you today.  *If you need a refill on your cardiac medications before your next appointment, please call your pharmacy*   Lab Work: Your physician recommends that you return for a FASTING lipid profile/CMET. You can come in on the day of your appointment anytime between 7:30-4:30. Wednesday, July 12.    If you have labs (blood work) drawn today and your tests are completely normal, you will receive your results only by: [place] (if you have MyChart) OR A paper copy in the mail If you have any lab test that is abnormal or we need to change your treatment, we will call you to review the results.   Testing/Procedures:  None ordered.   Follow-Up: At [facility], you and your health needs are our priority.  As part of our continuing mission to provide you with exceptional heart care, we have created designated Provider Care Teams.  These Care Teams include your primary Cardiologist (physician) and Advanced Practice Providers (APPs -  Physician Assistants and Nurse Practitioners) who all work together to provide you with the care you need, when you need it.  We recommend signing up for the patient portal called "MyChart".  Sign up information is provided on this After Visit Summary.  MyChart is used to connect with patients for Virtual Visits (Telemedicine).  Patients are able to view lab/test results, encounter notes, upcoming appointments, etc.  Non-urgent messages can be sent to your provider as well.   To learn more about what you can do with MyChart, go to [email or web address].    Your next appointment:   6 month(s)  The format for your next appointment:   In Person  Provider:   Richardson Dopp, PA-C         Other Instructions  Your physician wants you to follow-up in: 6 month with Richardson Dopp, PA-C.   You will receive a reminder letter in the mail two months in advance. If you don't receive a letter, please call our office to schedule the follow-up appointment.

## 2022-01-21 NOTE — Assessment & Plan Note (Signed)
Continue atorvastatin 40 mg daily.  Arrange fasting CMET, lipids prior to next visit in 6 months

## 2022-01-21 NOTE — Assessment & Plan Note (Signed)
Breathing seems to be stable.  Continue follow-up with pulmonology as planned.

## 2022-02-03 ENCOUNTER — Other Ambulatory Visit: Payer: Self-pay | Admitting: Gastroenterology

## 2022-02-27 ENCOUNTER — Other Ambulatory Visit: Payer: Self-pay | Admitting: Cardiovascular Disease

## 2022-02-27 DIAGNOSIS — E7800 Pure hypercholesterolemia, unspecified: Secondary | ICD-10-CM

## 2022-05-02 ENCOUNTER — Encounter: Payer: Medicare HMO | Admitting: Physician Assistant

## 2022-05-09 ENCOUNTER — Encounter: Payer: Self-pay | Admitting: Physician Assistant

## 2022-05-09 ENCOUNTER — Ambulatory Visit (INDEPENDENT_AMBULATORY_CARE_PROVIDER_SITE_OTHER): Payer: Medicare HMO | Admitting: Physician Assistant

## 2022-05-09 VITALS — BP 120/60 | HR 71 | Ht 62.0 in | Wt 135.6 lb

## 2022-05-09 DIAGNOSIS — R7303 Prediabetes: Secondary | ICD-10-CM | POA: Diagnosis not present

## 2022-05-09 DIAGNOSIS — R5383 Other fatigue: Secondary | ICD-10-CM

## 2022-05-09 DIAGNOSIS — M069 Rheumatoid arthritis, unspecified: Secondary | ICD-10-CM | POA: Diagnosis not present

## 2022-05-09 DIAGNOSIS — D508 Other iron deficiency anemias: Secondary | ICD-10-CM | POA: Diagnosis not present

## 2022-05-09 DIAGNOSIS — D649 Anemia, unspecified: Secondary | ICD-10-CM | POA: Insufficient documentation

## 2022-05-09 DIAGNOSIS — Z79899 Other long term (current) drug therapy: Secondary | ICD-10-CM | POA: Insufficient documentation

## 2022-05-09 DIAGNOSIS — R5381 Other malaise: Secondary | ICD-10-CM

## 2022-05-09 DIAGNOSIS — R799 Abnormal finding of blood chemistry, unspecified: Secondary | ICD-10-CM

## 2022-05-09 DIAGNOSIS — J8410 Pulmonary fibrosis, unspecified: Secondary | ICD-10-CM | POA: Insufficient documentation

## 2022-05-09 MED ORDER — FUSION PLUS PO CAPS: 1.0000 | ORAL_CAPSULE | ORAL | 3 refills | Status: DC

## 2022-05-09 NOTE — Patient Instructions (Signed)
You will get a call to schedule an appointment with Hematology

## 2022-05-09 NOTE — Progress Notes (Addendum)
Established Patient Office Visit  Subjective:  Patient ID: Tony Mcmillan, male    DOB: February 21, 1941  Age: 81 y.o. MRN: [id number]  CC:  Chief Complaint  Patient presents with   Medication Refill    Medcheck-pt also has a burning sensation in right arm. He also had labs done at his rheumatologist that were abnormal.    HPI Tony Mcmillan presents for a follow up appointment; has a history of iron deficiency anemia and fatigue; had a Colonoscopy 06/2021 that was stable; denies gross blood loss from his body; also reports right arm burning, but denies fall or injury; had abnormal cbc at Rheumatologist and was advised to follow up with PCP.  Outpatient Medications Prior to Visit  Medication Sig Dispense Refill   albuterol (VENTOLIN HFA) 108 (90 Base) MCG/ACT inhaler Inhale 2 puffs into the lungs every 6 (six) hours as needed for wheezing or shortness of breath. 8 g 6   Ascorbic Acid (VITAMIN C PO) Take 1 tablet by mouth daily.     atorvastatin (LIPITOR) 40 MG tablet TAKE 1 TABLET BY MOUTH EVERY DAY 90 tablet 3   Calcium Carb-Cholecalciferol (CALCIUM-VITAMIN D3) 600-400 MG-UNIT TABS Take 1 tablet by mouth 2 (two) times daily.     Glycopyrrolate-Formoterol (BEVESPI AEROSPHERE) 9-4.8 MCG/ACT AERO Inhale 2 puffs into the lungs in the morning and at bedtime. 32.1 g 3   inFLIXimab (REMICADE IV) Inject 100 mg into the vein as needed (for arthrithis).     isosorbide mononitrate (IMDUR) 30 MG 24 hr tablet Take 0.5 tablets (15 mg total) by mouth daily. 45 tablet 3   leflunomide (ARAVA) 20 MG tablet Take 20 mg by mouth daily.     metoprolol succinate (TOPROL-XL) 25 MG 24 hr tablet TAKE 1 TABLET BY MOUTH EVERYDAY AT BEDTIME 90 tablet 2   MYRBETRIQ 50 MG TB24 tablet Take 50 mg by mouth daily.      nitroGLYCERIN (NITROSTAT) 0.4 MG SL tablet PLACE 1 TABLET UNDER THE TONGUE EVERY 5 MINUTES AS NEEDED FOR CHEST PAIN. 25 tablet 6   pantoprazole (PROTONIX) 40 MG tablet TAKE 1 TABLET BY MOUTH EVERY DAY 90 tablet 0    aspirin EC 81 MG tablet Take 1 tablet (81 mg total) by mouth daily. (Patient not taking: Reported on 05/09/2022) 90 tablet 3   No facility-administered medications prior to visit.    No Known Allergies  Patient Care Team: Marcellina Millin as PCP - General (Physician Assistant) Freada Bergeron, MD as PCP - Cardiology (Cardiology) Sharmon Revere as Physician Assistant (Cardiology) Brand Males, MD as Consulting Physician (Pulmonary Disease) Raynelle Bring, MD as Consulting Physician (Urology) Hennie Duos, MD as Consulting Physician (Rheumatology)  RESULTS  RESULTS Name Result Date Reference Range  CBCD   2022-05-02    Baso (Absolute) 0.0   0.0-0.2  Basos 1   Not Estab.  Eos 2   Not Estab.  Eos (Absolute) 0.1   0.0-0.4  Hematocrit 26.0   37.5-51.0  Hematology Comments:        Hemoglobin 8.1   13.0-17.7  Immature Cells        Immature Grans (Abs) 0.0   0.0-0.1  Immature Granulocytes 0   Not Estab.  Lymphs 18   Not Estab.  Lymphs (Absolute) 0.8   0.7-3.1  MCH 24.8   26.6-33.0  MCHC 31.2   31.5-35.7  MCV 80   79-97  Monocytes 16   Not Estab.  Monocytes(Absolute) 0.8   0.1-0.9  Neutrophils 63  Not Estab.  Neutrophils (Absolute) 3.0   1.4-7.0  NRBC        Platelets 303   150-450  RBC 3.27   4.14-5.80  RDW 15.8   11.6-15.4  WBC 4.7   3.4-10.8  CMP   2022-05-02    A/G Ratio 1.5   1.2-2.2  Albumin 4.0   3.7-4.7  Alkaline Phosphatase 62   44-121  ALT (SGPT) 15   0-44  AST (SGOT) 25   0-40  Bilirubin, Total 0.3   0.0-1.2  BUN 15   8-27  BUN/Creatinine Ratio 14   10-24  Calcium 8.7   8.6-10.2  Carbon Dioxide, Total 16   20-29  Chloride 103   96-106  Creatinine 1.11   0.76-1.27  eGFR 67   >59  Globulin, Total 2.6   1.5-4.5  Glucose 103   70-99  Potassium 5.0   3.5-5.2  Protein, Total 6.6   6.0-8.5  Sodium 135   134-144  CBCD   2022-02-05    Baso (Absolute) 0.0   0.0-0.2  Basos 1   Not Estab.  Eos 3   Not Estab.  Eos (Absolute) 0.1    0.0-0.4  Hematocrit 38.2   37.5-51.0  Hematology Comments:        Hemoglobin 12.4   13.0-17.7  Immature Cells        Immature Grans (Abs) 0.0   0.0-0.1  Immature Granulocytes 0   Not Estab.  Lymphs 17   Not Estab.  Lymphs (Absolute) 0.7   0.7-3.1  MCH 27.5   26.6-33.0  MCHC 32.5   31.5-35.7  MCV 85   79-97  Monocytes 12   Not Estab.  Monocytes(Absolute) 0.5   0.1-0.9  Neutrophils 67   Not Estab.  Neutrophils (Absolute) 2.7   1.4-7.0  NRBC        Platelets 242   150-450  RBC 4.51   4.14-5.80  RDW 15.8   11.6-15.4  WBC 4.0   3.4-10.8  Sedimentation Rate-Westergren   2022-02-05    Sedimentation Rate-Westergren 36   0-30  CRPQ   2022-02-05    C-Reactive Protein, Quant 5   0-10  CMP   2022-02-05    A/G Ratio 1.4   1.2-2.2  Albumin 4.1   3.7-4.7  Alkaline Phosphatase 73   44-121  ALT (SGPT) 19   0-44  AST (SGOT) 29   0-40  Bilirubin, Total 0.3   0.0-1.2  BUN 13   8-27  BUN/Creatinine Ratio 13   10-24  Calcium 9.0   8.6-10.2  Carbon Dioxide, Total 20   20-29  Chloride 101   96-106  Creatinine 1.04   0.76-1.27  eGFR 73   >59  Globulin, Total 2.9   1.5-4.5  Glucose 104   70-99  Potassium 4.2   3.5-5.2  Protein, Total 7.0   6.0-8.5  Sodium 138   134-144  CBCD   2021-10-23    Baso (Absolute) 0.0   0.0-0.2  Basos 1   Not Estab.  Eos 3   Not Estab.  Eos (Absolute) 0.1   0.0-0.4  Hematocrit 36.3   37.5-51.0  Hematology Comments:        Hemoglobin 11.5   13.0-17.7  Immature Cells        Immature Grans (Abs) 0.0   0.0-0.1  Immature Granulocytes 0   Not Estab.  Lymphs 16   Not Estab.  Lymphs (Absolute) 0.7   0.7-3.1  MCH 25.9   26.6-33.0  MCHC 31.7  31.5-35.7  MCV 82   79-97  Monocytes 15   Not Estab.  Monocytes(Absolute) 0.6   0.1-0.9  Neutrophils 65   Not Estab.  Neutrophils (Absolute) 2.7   1.4-7.0  NRBC        Platelets 254   150-450  RBC 4.44   4.14-5.80  RDW 15.7   11.6-15.4  WBC 4.1   3.4-10.8  CMP   2021-10-23    A/G Ratio 1.6   1.2-2.2  Albumin 4.1    3.7-4.7  Alkaline Phosphatase 67   44-121  ALT (SGPT) 18   0-44  AST (SGOT) 19   0-40  Bilirubin, Total 0.3   0.0-1.2  BUN 9   8-27  BUN/Creatinine Ratio 8   10-24  Calcium 9.6   8.6-10.2  Carbon Dioxide, Total 22   20-29  Chloride 103   96-106  Creatinine 1.17   0.76-1.27  eGFR 63   >59  Globulin, Total 2.5   1.5-4.5  Glucose 108   70-99  Potassium 4.5   3.5-5.2  Protein, Total 6.6   6.0-8.5  Sodium 141   134-144  CBCD   2021-08-14    Baso (Absolute) 0.0   0.0-0.2  Basos 1   Not Estab.  Eos 2   Not Estab.  Eos (Absolute) 0.1   0.0-0.4  Hematocrit 35.8   37.5-51.0  Hematology Comments:        Hemoglobin 11.6   13.0-17.7  Immature Cells        Immature Grans (Abs) 0.0   0.0-0.1  Immature Granulocytes 0   Not Estab.  Lymphs 14   Not Estab.  Lymphs (Absolute) 0.7   0.7-3.1  MCH 26.1   26.6-33.0  MCHC 32.4   31.5-35.7  MCV 80   79-97  Monocytes 13   Not Estab.  Monocytes(Absolute) 0.7   0.1-0.9  Neutrophils 70   Not Estab.  Neutrophils (Absolute) 3.7   1.4-7.0  NRBC        Platelets 296   150-450  RBC 4.45   4.14-5.80  RDW 19.7   11.6-15.4  WBC 5.3   3.4-10.8  CMP   2021-08-14    A/G Ratio 1.5   1.2-2.2  Albumin 4.0   3.7-4.7  Alkaline Phosphatase 68   44-121  ALT (SGPT) 19   0-44  AST (SGOT) 22   0-40  Bilirubin, Total 0.4   0.0-1.2  BUN 14   8-27  BUN/Creatinine Ratio 11   10-24  Calcium 9.0   8.6-10.2  Carbon Dioxide, Total 22   20-29  Chloride 100   96-106  Creatinine 1.24   0.76-1.27  eGFR 59   >59  Globulin, Total 2.6   1.5-4.5  Glucose 109   65-99  Potassium 4.6   3.5-5.2  Protein, Total 6.6   6.0-8.5  Sodium 137   134-144  CBCD   2021-07-17    Baso (Absolute) 0.0   0.0-0.2  Basos 1   Not Estab.  Eos 2   Not Estab.  Eos (Absolute) 0.1   0.0-0.4  Hematocrit 38.5   37.5-51.0  Hematology Comments: Note:      Hemoglobin 12.5   13.0-17.7  Immature Cells        Immature Grans (Abs) 0.0   0.0-0.1  Immature Granulocytes 0   Not Estab.  Lymphs 15    Not Estab.  Lymphs (Absolute) 0.7   0.7-3.1  MCH 26.0   26.6-33.0  MCHC 32.5   31.5-35.7  MCV 80  79-97  Monocytes 11   Not Estab.  Monocytes(Absolute) 0.5   0.1-0.9  Neutrophils 71   Not Estab.  Neutrophils (Absolute) 3.3   1.4-7.0  NRBC        Platelets 248   150-450  RBC 4.80   4.14-5.80  RDW 22.5   11.6-15.4  WBC 4.6   3.4-10.8  CMP   2021-07-17    A/G Ratio 1.6   1.2-2.2  Albumin 4.0   3.7-4.7  Alkaline Phosphatase 65   44-121  ALT (SGPT) 17   0-44  AST (SGOT) 21   0-40  Bilirubin, Total 0.3   0.0-1.2  BUN 11   8-27  BUN/Creatinine Ratio 11   10-24  Calcium 9.4   8.6-10.2  Carbon Dioxide, Total 24   20-29  Chloride 100   96-106  Creatinine 1.02   0.76-1.27  eGFR 75   >59  Globulin, Total 2.5   1.5-4.5  Glucose 108   65-99  Potassium 4.3   3.5-5.2  Protein, Total 6.5   6.0-8.5  Sodium 138   134-144  CBCD   2021-06-04    Baso (Absolute) 0.0   0.0-0.2  Basos 1   Not Estab.  Eos 2   Not Estab.  Eos (Absolute) 0.1   0.0-0.4  Hematocrit 37.0   37.5-51.0  Hematology Comments:        Hemoglobin 11.7   13.0-17.7  Immature Cells        Immature Grans (Abs) 0.0   0.0-0.1  Immature Granulocytes 0   Not Estab.  Lymphs 15   Not Estab.  Lymphs (Absolute) 0.7   0.7-3.1  MCH 24.1   26.6-33.0  MCHC 31.6   31.5-35.7  MCV 76   79-97  Monocytes 11   Not Estab.  Monocytes(Absolute) 0.5   0.1-0.9  Neutrophils 71   Not Estab.  Neutrophils (Absolute) 3.2   1.4-7.0  NRBC        Platelets 279   150-450  RBC 4.85   4.14-5.80  RDW 19.9   11.6-15.4  WBC 4.5   3.4-10.8  CRPQ   2021-06-04    C-Reactive Protein, Quant 4   0-10  CMP   2021-06-04    A/G Ratio 1.4   1.2-2.2  Albumin 3.9   3.7-4.7  Alkaline Phosphatase 76   44-121  ALT (SGPT) 19   0-44  AST (SGOT) 25   0-40  Bilirubin, Total 0.3   0.0-1.2  BUN 10   8-27  BUN/Creatinine Ratio 9   10-24  Calcium 9.0   8.6-10.2  Carbon Dioxide, Total 22   20-29  Chloride 103   96-106  Creatinine 1.11   0.76-1.27  eGFR 68   >59   Globulin, Total 2.8   1.5-4.5  Glucose 100   65-99  Potassium 4.6   3.5-5.2  Protein, Total 6.7   6.0-8.5  Sodium 139   134-144  QuantiFERON-TB Gold Plus   2021-06-04    QuantiFERON Criteria        QuantiFERON Incubation Incubation performed.      QuantiFERON Mitogen Value 5.46      QuantiFERON Nil Value 0.00      QuantiFERON TB1 Ag Value 0.00      QuantiFERON TB2 Ag Value 0.00      QuantiFERON-TB Gold Plus Negative   Negative  CBCD   2021-05-04    Baso (Absolute) 0.0   0.0-0.2  Basos 1   Not Estab.  Eos 2   Not Estab.  Eos (Absolute) 0.1   0.0-0.4  Hematocrit 34.2   37.5-51.0  Hematology Comments:        Hemoglobin 10.4   13.0-17.7  Immature Cells        Immature Grans (Abs) 0.0   0.0-0.1  Immature Granulocytes 0   Not Estab.  Lymphs 9   Not Estab.  Lymphs (Absolute) 0.6   0.7-3.1  MCH 23.1   26.6-33.0  MCHC 30.4   31.5-35.7  MCV 76   79-97  Monocytes 14   Not Estab.  Monocytes(Absolute) 0.9   0.1-0.9  Neutrophils 74   Not Estab.  Neutrophils (Absolute) 5.0   1.4-7.0  NRBC        Platelets 310   150-450  RBC 4.51   4.14-5.80  RDW 15.9   11.6-15.4  WBC 6.7   3.4-10.8  CMP   2021-05-04    A/G Ratio 1.3   1.2-2.2  Albumin 3.8   3.7-4.7  Alkaline Phosphatase 77   44-121  ALT (SGPT) 30   0-44  AST (SGOT) 26   0-40  Bilirubin, Total 0.2   0.0-1.2  BUN 10   8-27  BUN/Creatinine Ratio 9   10-24  Calcium 8.9   8.6-10.2  Carbon Dioxide, Total 21   20-29  Chloride 104   96-106  Creatinine 1.09   0.76-1.27  eGFR 69   >59  Globulin, Total 3.0   1.5-4.5  Glucose 113   65-99  Potassium 4.0   3.5-5.2  Protein, Total 6.8   6.0-8.5  Sodium 138   134-144  CBCD   2021-04-04    Baso (Absolute) 0.1   0.0-0.2  Basos 1   Not Estab.  Eos 2   Not Estab.  Eos (Absolute) 0.1   0.0-0.4  Hematocrit 34.5   37.5-51.0  Hematology Comments:        Hemoglobin 11.0   13.0-17.7  Immature Cells        Immature Grans (Abs) 0.0   0.0-0.1  Immature Granulocytes 0   Not Estab.  Lymphs 10    Not Estab.  Lymphs (Absolute) 0.5   0.7-3.1  MCH 25.2   26.6-33.0  MCHC 31.9   31.5-35.7  MCV 79   79-97  Monocytes 13   Not Estab.  Monocytes(Absolute) 0.6   0.1-0.9  Neutrophils 74   Not Estab.  Neutrophils (Absolute) 3.6   1.4-7.0  NRBC        Platelets 311   150-450  RBC 4.36   4.14-5.80  RDW 15.3   11.6-15.4  WBC 4.8   3.4-10.8  CMP   2021-04-04    A/G Ratio 1.5   1.2-2.2  Albumin 4.2   3.7-4.7  Alkaline Phosphatase 80   44-121  ALT (SGPT) 22   0-44  AST (SGOT) 26   0-40  Bilirubin, Total 0.3   0.0-1.2  BUN 15   8-27  BUN/Creatinine Ratio 12   10-24  Calcium 9.4   8.6-10.2  Carbon Dioxide, Total 22   20-29  Chloride 105   96-106  Creatinine 1.22   0.76-1.27  eGFR 60   >59  Globulin, Total 2.8   1.5-4.5  Glucose 105   65-99  Potassium 4.8   3.5-5.2  Protein, Total 7.0   6.0-8.5  Sodium 139   134-144  Uric Acid, Serum   2021-03-07    Uric Acid 6.3   3.8-8.4  CBCD   2021-03-07    Baso (Absolute) 0.0   0.0-0.2  Basos 1  Not Estab.  Eos 1   Not Estab.  Eos (Absolute) 0.1   0.0-0.4  Hematocrit 38.1   37.5-51.0  Hematology Comments:        Hemoglobin 12.1   13.0-17.7  Immature Cells        Immature Grans (Abs) 0.0   0.0-0.1  Immature Granulocytes 0   Not Estab.  Lymphs 11   Not Estab.  Lymphs (Absolute) 0.6   0.7-3.1  MCH 26.9   26.6-33.0  MCHC 31.8   31.5-35.7  MCV 85   79-97  Monocytes 12   Not Estab.  Monocytes(Absolute) 0.7   0.1-0.9  Neutrophils 75   Not Estab.  Neutrophils (Absolute) 4.2   1.4-7.0  NRBC        Platelets 329   150-450  RBC 4.50   4.14-5.80  RDW 14.9   11.6-15.4  WBC 5.5   3.4-10.8  Sedimentation Rate-Westergren   2021-03-07    Sedimentation Rate-Westergren 54   0-30  CRPQ   2021-03-07    C-Reactive Protein, Quant 6   0-10  HLA B 27 Disease Association   2021-03-07    HLA-B27 Negative      ACE   2021-03-07    ACE, Serum 47   14-82  CMP   2021-03-07    A/G Ratio 1.5   1.2-2.2  Albumin 4.3   3.7-4.7  Alkaline Phosphatase 71    44-121  ALT (SGPT) 23   0-44  AST (SGOT) 24   0-40  Bilirubin, Total 0.8   0.0-1.2  BUN 11   8-27  BUN/Creatinine Ratio 9   10-24  Calcium 9.4   8.6-10.2  Carbon Dioxide, Total 23   20-29  Chloride 102   96-106  Creatinine 1.21   0.76-1.27  eGFR 61   >59  Globulin, Total 2.8   1.5-4.5  Glucose 91   65-99  Potassium 4.9   3.5-5.2  Protein, Total 7.1   6.0-8.5  Sodium 140   134-144  Hepatitis Panel (4)   2021-03-07    HBsAg Screen Negative   Negative  Hep A Ab, IgM Negative   Negative  Hep B Core Ab, IgM Negative   Negative  Hep C Virus Ab <0.1   0.0-0.9  Hand-Bilateral AP 1V Joint Survey Xray   2021-03-07     REASON FOR VISIT  MY 3 month f/u, Remicade, Labs, LAb results, Remicade IV, Follow-up for Rheumatoid arthritis, Remicade IV, labs, Remicade IV/Free drug, Remicade IV/Free drug, Remicade increase interval, Follow-up for Rheumatoid arthritis, Remicade IV, Labs, Remicade increase, FU Rheumatoid arthritis, Remicade IV, insurance/infusion, Remicade IV, Remicade IV #3, Follow-up for Rheumatoid arthritis, Michelle young, Remicade IV #2, Remicade IV #1, leflunomide 90 day, Remicade-pending shipment, EG 2 month f/u Rheumatoid Arthritis, labs, follow up, Labs, eg/4 wks np f/up Rheumatoid Arthritis, NP/GRAY/TRANSFER [place] Name Health Plan Coverage Dates Member ID Patient Relationship to Subscriber Patient Address Patient Phone Patient Name Patient Date of Birth [place] Name [place] Date of Birth Group No  [employer] [address] [place] [address] [phone] [employer]     self            ROS Review of Systems  Constitutional:  Negative for activity change, chills, fatigue and fever.  HENT:  Negative for congestion, ear pain, hearing loss and voice change.   Eyes:  Negative for  pain and redness.   Respiratory:  Negative for cough and shortness of breath.   Cardiovascular:  Negative for leg swelling.  Gastrointestinal:  Negative for constipation, diarrhea, nausea and vomiting.  Endocrine: Negative for polyuria.  Genitourinary:  Negative for flank pain and frequency.  Musculoskeletal:  Negative for joint swelling and neck pain.  Skin:  Negative for rash.  Neurological:  Negative for dizziness.  Hematological:  Does not bruise/bleed easily.  Psychiatric/Behavioral:  Negative for agitation and behavioral problems.      Objective:    Physical Exam Vitals reviewed.  Constitutional:      General: He is not in acute distress.    Appearance: Normal appearance.  HENT:     Head: Normocephalic and atraumatic.     Right Ear: External ear normal.     Left Ear: External ear normal.     Nose: No congestion.  Eyes:     Extraocular Movements: Extraocular movements intact.     Conjunctiva/sclera: Conjunctivae normal.     Pupils: Pupils are equal, round, and reactive to light.  Cardiovascular:     Rate and Rhythm: Normal rate and regular rhythm.     Pulses: Normal pulses.     Heart sounds: Normal heart sounds.  Pulmonary:     Effort: Pulmonary effort is normal.     Breath sounds: Normal breath sounds. No wheezing.  Abdominal:     General: Bowel sounds are normal.     Palpations: Abdomen is soft.  Musculoskeletal:        General: Normal range of motion.     Cervical back: Normal range of motion.     Right lower leg: No edema.     Left lower leg: No edema.  Skin:    General: Skin is warm and dry.  Neurological:     Mental Status: He is alert and oriented to person, place, and time.  Psychiatric:        Mood and Affect: Mood normal.        Behavior: Behavior normal.    BP 120/60   Pulse 71   Wt 135 lb 9.6 oz (61.5 kg)   SpO2 99%   BMI 24.80 kg/m   Wt Readings from Last 3 Encounters:  05/09/22 135 lb 9.6 oz (61.5 kg)  01/21/22 138 lb 6.4 oz (62.8 kg)  01/06/22 138 lb  (62.6 kg)    Results for orders placed or performed during the hospital encounter of 01/06/22  CBC with Differential  Result Value Ref Range   WBC 4.6 4.0 - 10.5 K/uL   RBC 5.29 4.22 - 5.81 MIL/uL   Hemoglobin 14.2 13.0 - 17.0 g/dL   HCT 43.5 39.0 - 52.0 %   MCV 82.2 80.0 - 100.0 fL   MCH 26.8 26.0 - 34.0 pg   MCHC 32.6 30.0 - 36.0 g/dL   RDW 15.7 (H) 11.5 - 15.5 %   Platelets 229 150 - 400 K/uL   nRBC 0.0 0.0 - 0.2 %   Neutrophils Relative % 63 %   Neutro Abs 2.9 1.7 - 7.7 K/uL   Lymphocytes Relative 22 %   Lymphs Abs 1.0 0.7 - 4.0 K/uL   Monocytes Relative 13 %   Monocytes Absolute 0.6 0.1 - 1.0 K/uL   Eosinophils Relative 2 %   Eosinophils Absolute 0.1 0.0 - 0.5 K/uL   Basophils Relative 0 %   Basophils Absolute 0.0 0.0 - 0.1 K/uL   Immature Granulocytes 0 %   Abs Immature Granulocytes 0.01  0.00 - 0.07 K/uL  Comprehensive metabolic panel  Result Value Ref Range   Sodium 138 135 - 145 mmol/L   Potassium 4.4 3.5 - 5.1 mmol/L   Chloride 104 98 - 111 mmol/L   CO2 25 22 - 32 mmol/L   Glucose, Bld 105 (H) 70 - 99 mg/dL   BUN 15 8 - 23 mg/dL   Creatinine, Ser 1.18 0.61 - 1.24 mg/dL   Calcium 9.5 8.9 - 10.3 mg/dL   Total Protein 7.6 6.5 - 8.1 g/dL   Albumin 4.2 3.5 - 5.0 g/dL   AST 21 15 - 41 U/L   ALT 15 0 - 44 U/L   Alkaline Phosphatase 55 38 - 126 U/L   Total Bilirubin 0.8 0.3 - 1.2 mg/dL   GFR, Estimated >60 >60 mL/min   Anion gap 9 5 - 15  Troponin I (High Sensitivity)  Result Value Ref Range   Troponin I (High Sensitivity) 6 <18 ng/L  Troponin I (High Sensitivity)  Result Value Ref Range   Troponin I (High Sensitivity) 6 <18 ng/L       The ASCVD Risk score (Arnett DK, et al., 2019) failed to calculate for the following reasons:   The 2019 ASCVD risk score is only valid for ages 62 to 59    Assessment & Plan:   Problem List Items Addressed This Visit       Musculoskeletal and Integument   Rheumatoid arthritis (HCC)     Other   Prediabetes    Absolute anemia - Primary   Relevant Medications   Iron-FA-B Cmp-C-Biot-Probiotic (FUSION PLUS) CAPS   Other Relevant Orders   PT and PTT   Folate   Vitamin B12   TSH + free T4   Fe+CBC/D/Plt+TIBC+Fer+Retic   Comprehensive metabolic panel   Ambulatory referral to Hematology / Oncology    Meds ordered this encounter  Medications   Iron-FA-B Cmp-C-Biot-Probiotic (FUSION PLUS) CAPS    Sig: Take 1 capsule by mouth every other day. Take with a full meal    Dispense:  60 capsule    Refill:  3    Order Specific Question:   Supervising Provider    Answer:   Denita Lung [[id number]]    Follow-up: Return in about 6 months (around 11/09/2022) for Return for Annual Exam with PCP Jimmye Norman.    Irene Pap, PA-C

## 2022-05-10 ENCOUNTER — Encounter: Payer: Self-pay | Admitting: Physician Assistant

## 2022-05-10 LAB — PT AND PTT
INR: 1 (ref 0.9–1.2)
Prothrombin Time: 10.3 s (ref 9.1–12.0)
aPTT: 28 s (ref 24–33)

## 2022-05-10 LAB — COMPREHENSIVE METABOLIC PANEL
ALT: 14 IU/L (ref 0–44)
AST: 20 IU/L (ref 0–40)
Albumin/Globulin Ratio: 1.6 (ref 1.2–2.2)
Albumin: 4.4 g/dL (ref 3.7–4.7)
Alkaline Phosphatase: 65 IU/L (ref 44–121)
BUN/Creatinine Ratio: 14 (ref 10–24)
BUN: 17 mg/dL (ref 8–27)
Bilirubin Total: 0.3 mg/dL (ref 0.0–1.2)
CO2: 23 mmol/L (ref 20–29)
Calcium: 9.1 mg/dL (ref 8.6–10.2)
Chloride: 102 mmol/L (ref 96–106)
Creatinine, Ser: 1.19 mg/dL (ref 0.76–1.27)
Globulin, Total: 2.8 g/dL (ref 1.5–4.5)
Glucose: 103 mg/dL — ABNORMAL HIGH (ref 70–99)
Potassium: 4.7 mmol/L (ref 3.5–5.2)
Sodium: 138 mmol/L (ref 134–144)
Total Protein: 7.2 g/dL (ref 6.0–8.5)
eGFR: 62 mL/min/{1.73_m2} (ref >59)

## 2022-05-10 LAB — FE+CBC/D/PLT+TIBC+FER+RETIC
Basophils Absolute: 0 10*3/uL (ref 0.0–0.2)
Basos: 1 %
EOS (ABSOLUTE): 0.1 10*3/uL (ref 0.0–0.4)
Eos: 2 %
Ferritin: 12 ng/mL — ABNORMAL LOW (ref 30–400)
Hematocrit: 27.8 % — ABNORMAL LOW (ref 37.5–51.0)
Hemoglobin: 8.4 g/dL — ABNORMAL LOW (ref 13.0–17.7)
Immature Grans (Abs): 0 10*3/uL (ref 0.0–0.1)
Immature Granulocytes: 0 %
Iron Saturation: 4 % — CL (ref 15–55)
Iron: 15 ug/dL — ABNORMAL LOW (ref 38–169)
Lymphocytes Absolute: 0.7 10*3/uL (ref 0.7–3.1)
Lymphs: 18 %
MCH: 23.9 pg — ABNORMAL LOW (ref 26.6–33.0)
MCHC: 30.2 g/dL — ABNORMAL LOW (ref 31.5–35.7)
MCV: 79 fL (ref 79–97)
Monocytes Absolute: 0.6 10*3/uL (ref 0.1–0.9)
Monocytes: 16 %
Neutrophils Absolute: 2.5 10*3/uL (ref 1.4–7.0)
Neutrophils: 63 %
Platelets: 320 10*3/uL (ref 150–450)
RBC: 3.51 x10E6/uL — ABNORMAL LOW (ref 4.14–5.80)
RDW: 16.2 % — ABNORMAL HIGH (ref 11.6–15.4)
Retic Ct Pct: 1.8 % (ref 0.6–2.6)
Total Iron Binding Capacity: 362 ug/dL (ref 250–450)
UIBC: 347 ug/dL — ABNORMAL HIGH (ref 111–343)
WBC: 4 10*3/uL (ref 3.4–10.8)

## 2022-05-10 LAB — VITAMIN B12: Vitamin B-12: 664 pg/mL (ref 232–1245)

## 2022-05-10 LAB — TSH+FREE T4
Free T4: 1.4 ng/dL (ref 0.82–1.77)
TSH: 1.63 u[IU]/mL (ref 0.450–4.500)

## 2022-05-10 LAB — FOLATE: Folate: 11.7 ng/mL (ref >3.0)

## 2022-05-14 ENCOUNTER — Encounter (HOSPITAL_BASED_OUTPATIENT_CLINIC_OR_DEPARTMENT_OTHER): Payer: Self-pay | Admitting: Emergency Medicine

## 2022-05-14 ENCOUNTER — Emergency Department (HOSPITAL_BASED_OUTPATIENT_CLINIC_OR_DEPARTMENT_OTHER)
Admission: EM | Admit: 2022-05-14 | Discharge: 2022-05-14 | Disposition: A | Discharge status: Home / Self Care | Payer: Medicare HMO | Attending: Emergency Medicine | Admitting: Emergency Medicine

## 2022-05-14 ENCOUNTER — Other Ambulatory Visit: Payer: Self-pay

## 2022-05-14 DIAGNOSIS — I2510 Atherosclerotic heart disease of native coronary artery without angina pectoris: Secondary | ICD-10-CM | POA: Diagnosis not present

## 2022-05-14 DIAGNOSIS — M25421 Effusion, right elbow: Secondary | ICD-10-CM | POA: Diagnosis present

## 2022-05-14 DIAGNOSIS — M7021 Olecranon bursitis, right elbow: Secondary | ICD-10-CM | POA: Diagnosis not present

## 2022-05-14 DIAGNOSIS — Y9389 Activity, other specified: Secondary | ICD-10-CM | POA: Diagnosis not present

## 2022-05-14 MED ORDER — ACETAMINOPHEN 500 MG PO TABS: 1000.0000 mg | ORAL_TABLET | Freq: Four times a day (QID) | ORAL | 0 refills | Status: DC | PRN

## 2022-05-14 NOTE — ED Notes (Signed)
Rt. Elbow swelling appears to be bursitis.  Pt states it does not hurt

## 2022-05-14 NOTE — ED Provider Notes (Signed)
[facility] EMERGENCY DEPT Provider Note   CSN: [id number] Arrival date & time: 05/14/22  1811     History  Chief Complaint  Patient presents with   Joint Swelling    Tony Mcmillan is a 81 y.o. male.  Physical exam is consistent with nonseptic bursitis.  He has no pain with range of motion and there is no redness or overlying erythema.  Will discharge home   Patient is an 81 year old male with past medical history significant for anemia, reflux, palpitations, rheumatoid arthritis emphysema, CAD, HLD  Patient presented emergency room today with complaints of right elbow swelling that he noticed yesterday evening.  He states he might of been present earlier.  He denies any pain or any other symptoms.  He denies any redness or warmth to touch and states it is not painful to move his arm at all.       Home Medications Prior to Admission medications   Medication Sig Start Date End Date Taking? Authorizing Provider  acetaminophen (TYLENOL) 500 MG tablet Take 2 tablets (1,000 mg total) by mouth every 6 (six) hours as needed. 05/14/22  Yes ,  S, PA  albuterol (VENTOLIN HFA) 108 (90 Base) MCG/ACT inhaler Inhale 2 puffs into the lungs every 6 (six) hours as needed for wheezing or shortness of breath. 11/08/21   Brand Males, MD  Ascorbic Acid (VITAMIN C PO) Take 1 tablet by mouth daily.    [provider]  atorvastatin (LIPITOR) 40 MG tablet TAKE 1 TABLET BY MOUTH EVERY DAY 02/27/22   Josue Hector, MD  Calcium Carb-Cholecalciferol (CALCIUM-VITAMIN D3) 600-400 MG-UNIT TABS Take 1 tablet by mouth 2 (two) times daily.    [provider]  Glycopyrrolate-Formoterol (BEVESPI AEROSPHERE) 9-4.8 MCG/ACT AERO Inhale 2 puffs into the lungs in the morning and at bedtime. 11/08/21   Brand Males, MD  inFLIXimab (REMICADE IV) Inject 100 mg into the vein as needed (for arthrithis).    [provider]  Iron-FA-B Cmp-C-Biot-Probiotic (FUSION  PLUS) CAPS Take 1 capsule by mouth every other day. Take with a full meal 05/09/22   Francis Gaines B, PA-C  isosorbide mononitrate (IMDUR) 30 MG 24 hr tablet Take 0.5 tablets (15 mg total) by mouth daily. 09/03/21   Burnell Blanks, MD  leflunomide (ARAVA) 20 MG tablet Take 20 mg by mouth daily.    [provider]  metoprolol succinate (TOPROL-XL) 25 MG 24 hr tablet TAKE 1 TABLET BY MOUTH EVERYDAY AT BEDTIME 09/17/21   Weaver, Scott T, PA-C  MYRBETRIQ 50 MG TB24 tablet Take 50 mg by mouth daily.  02/07/19   [provider]  nitroGLYCERIN (NITROSTAT) 0.4 MG SL tablet PLACE 1 TABLET UNDER THE TONGUE EVERY 5 MINUTES AS NEEDED FOR CHEST PAIN. 08/29/21   Richardson Dopp T, PA-C  pantoprazole (PROTONIX) 40 MG tablet TAKE 1 TABLET BY MOUTH EVERY DAY 02/04/22   Ladene Artist, MD      Allergies    Patient has no known allergies.    Review of Systems   Review of Systems  Physical Exam Updated Vital Signs BP 137/79 (BP Location: Left Arm)   Pulse 90   Temp 98.4 F (36.9 C) (Oral)   Resp 18   Ht '5\' 2"'$  (1.575 m)   Wt 61.2 kg   SpO2 100%   BMI 24.69 kg/m  Physical Exam Vitals and nursing note reviewed.  Constitutional:      General: He is not in acute distress.    Appearance: Normal  appearance. He is not ill-appearing.  HENT:     Head: Normocephalic and atraumatic.  Eyes:     General: No scleral icterus.       Right eye: No discharge.        Left eye: No discharge.     Conjunctiva/sclera: Conjunctivae normal.  Pulmonary:     Effort: Pulmonary effort is normal.     Breath sounds: No stridor.  Musculoskeletal:     Comments: Full range of motion of right elbow.  No discomfort with axial loading of the right upper extremity.  There is no redness over the elbow and no significant warmth to touch.  Not particularly tender to touch.  Neurological:     Mental Status: He is alert and oriented to person, place, and time. Mental status is at baseline.       ED  Results / Procedures / Treatments   Labs (all labs ordered are listed, but only abnormal results are displayed) Labs Reviewed - No data to display  EKG None  Radiology No results found.  Procedures Procedures    Medications Ordered in ED Medications - No data to display  ED Course/ Medical Decision Making/ A&P                           Medical Decision Making  Patient is an 81 year old male with past medical history significant for anemia, reflux, palpitations, rheumatoid arthritis emphysema, CAD, HLD  Patient presented emergency room today with complaints of right elbow swelling that he noticed yesterday evening.  He states he might of been present earlier.  He denies any pain or any other symptoms.  He denies any redness or warmth to touch and states it is not painful to move his arm at all.  Physical exam is consistent with nonseptic bursitis.  He has no pain with range of motion and   DC home with [facility] recommended and PCP and ortho FU (established with both per himself).   I discussed this case with my attending physician who cosigned this note including patient's presenting symptoms, physical exam, and planned diagnostics and interventions. Attending physician stated agreement with plan or made changes to plan which were implemented.   DC-ed home at this time. ACE wrap applied in ER.    Final Clinical Impression(s) / ED Diagnoses Final diagnoses:  Olecranon bursitis of right elbow    Rx / DC Orders ED Discharge Orders          Ordered    acetaminophen (TYLENOL) 500 MG tablet  Every 6 hours PRN        05/14/22 1842              Tedd Sias, PA 80/32/12 2482    Lianne Cure, DO 50/03/70 2349

## 2022-05-14 NOTE — Discharge Instructions (Addendum)
You have a condition called bursitis of the elbow.  Rest, ice, compression and elevation will help with this as well as Tylenol.  Please follow-up with your primary care provider or with the orthopedist that you were seeing about this.

## 2022-05-14 NOTE — ED Triage Notes (Signed)
Pt from home c/o of fluid on his right elbow. Pt stated he noticed it last night and doesn't know how it got there. Pt denis pain.

## 2022-05-24 ENCOUNTER — Other Ambulatory Visit: Payer: Self-pay | Admitting: *Deleted

## 2022-05-24 DIAGNOSIS — Z862 Personal history of diseases of the blood and blood-forming organs and certain disorders involving the immune mechanism: Secondary | ICD-10-CM

## 2022-05-24 DIAGNOSIS — M7021 Olecranon bursitis, right elbow: Secondary | ICD-10-CM | POA: Insufficient documentation

## 2022-05-27 ENCOUNTER — Inpatient Hospital Stay: Payer: Medicare HMO | Attending: Nurse Practitioner | Admitting: Nurse Practitioner

## 2022-05-27 ENCOUNTER — Encounter: Payer: Self-pay | Admitting: Nurse Practitioner

## 2022-05-27 ENCOUNTER — Inpatient Hospital Stay: Payer: Medicare HMO

## 2022-05-27 VITALS — BP 121/58 | HR 75 | Temp 98.1°F | Resp 18 | Ht 62.0 in | Wt 134.8 lb

## 2022-05-27 DIAGNOSIS — D509 Iron deficiency anemia, unspecified: Secondary | ICD-10-CM | POA: Diagnosis present

## 2022-05-27 DIAGNOSIS — K5900 Constipation, unspecified: Secondary | ICD-10-CM | POA: Insufficient documentation

## 2022-05-27 DIAGNOSIS — Z87891 Personal history of nicotine dependence: Secondary | ICD-10-CM | POA: Insufficient documentation

## 2022-05-27 DIAGNOSIS — Z862 Personal history of diseases of the blood and blood-forming organs and certain disorders involving the immune mechanism: Secondary | ICD-10-CM

## 2022-05-27 LAB — URINALYSIS, COMPLETE (UACMP) WITH MICROSCOPIC
Bilirubin Urine: NEGATIVE
Glucose, UA: NEGATIVE mg/dL
Hgb urine dipstick: NEGATIVE
Ketones, ur: NEGATIVE mg/dL
Leukocytes,Ua: NEGATIVE
Nitrite: NEGATIVE
Protein, ur: NEGATIVE mg/dL
Specific Gravity, Urine: 1.013 (ref 1.005–1.030)
pH: 6 (ref 5.0–8.0)

## 2022-05-27 LAB — CBC WITH DIFFERENTIAL (CANCER CENTER ONLY)
Abs Immature Granulocytes: 0.02 10*3/uL (ref 0.00–0.07)
Basophils Absolute: 0.1 10*3/uL (ref 0.0–0.1)
Basophils Relative: 1 %
Eosinophils Absolute: 0.1 10*3/uL (ref 0.0–0.5)
Eosinophils Relative: 2 %
HCT: 30.3 % — ABNORMAL LOW (ref 39.0–52.0)
Hemoglobin: 8.9 g/dL — ABNORMAL LOW (ref 13.0–17.0)
Immature Granulocytes: 1 %
Lymphocytes Relative: 17 %
Lymphs Abs: 0.7 10*3/uL (ref 0.7–4.0)
MCH: 22.7 pg — ABNORMAL LOW (ref 26.0–34.0)
MCHC: 29.4 g/dL — ABNORMAL LOW (ref 30.0–36.0)
MCV: 77.3 fL — ABNORMAL LOW (ref 80.0–100.0)
Monocytes Absolute: 0.7 10*3/uL (ref 0.1–1.0)
Monocytes Relative: 15 %
Neutro Abs: 2.9 10*3/uL (ref 1.7–7.7)
Neutrophils Relative %: 64 %
Platelet Count: 335 10*3/uL (ref 150–400)
RBC: 3.92 MIL/uL — ABNORMAL LOW (ref 4.22–5.81)
RDW: 18 % — ABNORMAL HIGH (ref 11.5–15.5)
WBC Count: 4.4 10*3/uL (ref 4.0–10.5)
nRBC: 0 % (ref 0.0–0.2)

## 2022-05-27 LAB — SAVE SMEAR(SSMR), FOR PROVIDER SLIDE REVIEW

## 2022-05-27 LAB — FERRITIN: Ferritin: 44 ng/mL (ref 24–336)

## 2022-05-27 MED ORDER — POLYETHYLENE GLYCOL 3350 17 G PO PACK
17.0000 g | PACK | Freq: Every day | ORAL | 0 refills | Status: AC | PRN
Start: 2022-05-27 — End: ?

## 2022-05-27 MED ORDER — FERROUS SULFATE 325 (65 FE) MG PO TBEC: 325.0000 mg | DELAYED_RELEASE_TABLET | Freq: Two times a day (BID) | ORAL | Status: DC

## 2022-05-27 NOTE — Patient Instructions (Addendum)
Please take ferrous sulfate two (2) times daily. Take stool softener and miralax as needed for constipation.

## 2022-05-27 NOTE — Progress Notes (Signed)
New Hematology/Oncology Consult   Requesting MD: Francis Gaines, [place]  [phone]  Reason for Consult: Iron deficiency anemia  HPI: Tony Mcmillan is an 81 year old man referred for evaluation of iron deficiency anemia.  He was seen for routine follow-up by Francis Gaines, PA on 05/09/2022.  CBC showed hemoglobin 8.4, MCV 79, white count 4.0, platelet count 320,000, ferritin 12, TIBC 362, UIBC 347, iron 15, percent saturation 4; B12 normal at 664, folate normal at 11.7, chemistry panel unremarkable.  It was recommended he begin oral iron.  Comparison CBC is from 01/06/2022 at which time the hemoglobin was normal at 14.2, MCV 82.  Colonoscopy 07/09/2021 for surveillance-moderate diverticulosis entire examined colon, single nonbleeding cecum angiodysplastic lesion without bleeding.  Review of records in the EMR indicate a history of iron deficiency, heme positive stool.  He was not aware of this.  The iron deficiency dates to 2016.  He underwent an upper endoscopy 01/09/2016-gastritis, hiatal hernia, nonbleeding ulcer in the duodenal bulb.  Colonoscopy 02/20/2016-2 sessile polyps in the transverse colon and descending colon, moderate diverticulosis, prior surgical anastomosis in the left colon, 6 mm angiodysplastic lesion at the cecum, grade 1 internal hemorrhoids.   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 Pilar Jarvis [facility].     Atypical chest pain    a. 09/2016 MV: EF 63%, normal perfusion.   Benign prostatic hyperplasia 04/03/2016   Asymptomatic. Normal PSA and exam by Dr. Pilar Jarvis [facility] 01/2016.     Bilateral carotid bruits 02/09/2019   Bladder wall thickening 03/10/2020   CAD (coronary artery disease)    Coronary artery calcification on  Chest CT in 09/2016 and 01/2019 // Myoview 10/17: normal perfusion // Myoview 01/2019: no ischemia or infarction, EF 58 // Coronary CTA 03/2020: Calcium Score 1067; non-obstructive CAD [oLM 1-24, LAD ost 25049, mid and dist 1-24; D1 1-24; LCx prox and mid 1-24, OM1 and OM2 25-49, RCA and PLB 1-24; PLA 1-24]    Carotid bruit    Carotid Dopplers 01/2019: no ICA stenosis bilaterally    Colonic polyp 02/23/2016   COVID-19    DDD (degenerative disc disease), 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 L4-5 and K9-X8    Diastolic dysfunction    a. 2015 Echo: EF >55%;  b. 09/2016 Echo: EF 55-60%, no rwma, Gr1 DD, triv AI/MR, mild TR, PASP 19mHg.   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   On CT 01/2016    Palpitations    Echo 10/17: Mild focal basal septal hypertrophy, EF 55-60, no RWMA, Gr 1 DD, trivial MR, normal RVSF, mild TR, PASP 28 // Event monitor 01/2019: NSR, PACs   Prediabetes 10/29/2018   Pulmonary emphysema ([facility] 12/18/2016   Renal cyst, left 04/03/2016   On CT, benign    Rheumatoid arthritis ([facility]    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[facility] [place] 11/04/2016   -RF,-CCP,-[id number], erosive disease with contractures   Tubular adenoma of  colon 01/2016   Vitamin D deficiency 11/04/2016     Past Surgical History:  Procedure Laterality Date   ANTERIOR CERVICAL CORPECTOMY     ANTERIOR CERVICAL CORPECTOMY  12/2014   for infection. this was in [place]     both eyes   COLONOSCOPY     COLOSTOMY REVERSAL     had infection on buttocks and had to have a skin graft- had colostomy to help area stay clean and heal   HERNIA REPAIR     POLYPECTOMY      SPINE SURGERY       Current Outpatient Medications:    acetaminophen (TYLENOL) 500 MG tablet, Take 2 tablets (1,000 mg total) by mouth every 6 (six) hours as needed., Disp: 30 tablet, Rfl: 0   albuterol (VENTOLIN HFA) 108 (90 Base) MCG/ACT inhaler, Inhale 2 puffs into the lungs every 6 (six) hours as needed for wheezing or shortness of breath., Disp: 8 g, Rfl: 6   Ascorbic Acid (VITAMIN C PO), Take 1 tablet by mouth daily., Disp: , Rfl:    atorvastatin (LIPITOR) 40 MG tablet, TAKE 1 TABLET BY MOUTH EVERY DAY, Disp: 90 tablet, Rfl: 3   Calcium Carb-Cholecalciferol (CALCIUM-VITAMIN D3) 600-400 MG-UNIT TABS, Take 1 tablet by mouth 2 (two) times daily., Disp: , Rfl:    Glycopyrrolate-Formoterol (BEVESPI AEROSPHERE) 9-4.8 MCG/ACT AERO, Inhale 2 puffs into the lungs in the morning and at bedtime., Disp: 32.1 g, Rfl: 3   inFLIXimab (REMICADE IV), Inject 100 mg into the vein as needed (for arthrithis)., Disp: , Rfl:    Iron-FA-B Cmp-C-Biot-Probiotic (FUSION PLUS) CAPS, Take 1 capsule by mouth every other day. Take with a full meal, Disp: 60 capsule, Rfl: 3   isosorbide mononitrate (IMDUR) 30 MG 24 hr tablet, Take 0.5 tablets (15 mg total) by mouth daily., Disp: 45 tablet, Rfl: 3   leflunomide (ARAVA) 20 MG tablet, Take 20 mg by mouth daily., Disp: , Rfl:    metoprolol succinate (TOPROL-XL) 25 MG 24 hr tablet, TAKE 1 TABLET BY MOUTH EVERYDAY AT BEDTIME, Disp: 90 tablet, Rfl: 2   MYRBETRIQ 50 MG TB24 tablet, Take 50 mg by mouth daily. , Disp: , Rfl:    nitroGLYCERIN (NITROSTAT) 0.4 MG SL tablet, PLACE 1 TABLET UNDER THE TONGUE EVERY 5 MINUTES AS NEEDED FOR CHEST PAIN., Disp: 25 tablet, Rfl: 6   pantoprazole (PROTONIX) 40 MG tablet, TAKE 1 TABLET BY MOUTH EVERY DAY, Disp: 90 tablet, Rfl: 0:   No Known Allergies:  FH: No family history of anemia.  No family history of hemoglobinopathy.  SOCIAL HISTORY: He lives in [place].  He has 7 children.  He is retired from [employer].  He worked as a Passenger transport manager.  He quit smoking and drinking 30 years ago.  Review of Systems: He has been taking oral iron every other day for the past month.  He notes constipation.  He notes stools are dark since beginning oral iron.  He is not aware of any bleeding.  No fevers or sweats.  No anorexia or weight loss.  He eats a regular diet.  He denies tongue soreness.  He craves ice.  No nausea or vomiting.  No dysphagia.  No shortness of breath.  Occasional cough.  No abdominal or back pain.     Physical Exam:  Blood pressure (!) 121/58, pulse 75, temperature 98.1 F (36.7 C), temperature source Oral, resp. rate 18, height '5\' 2"'$  (1.575 m), weight 134 lb 12.8 oz (61.1  kg), SpO2 100 %.  HEENT: No thrush or ulcers. Lungs: Breath sounds diminished right lower lung field.  No respiratory distress. Cardiac: Regular rate and rhythm. Abdomen: No hepatosplenomegaly.  No mass. Vascular: No leg edema. Lymph nodes: No palpable cervical, supraclavicular, axillary or inguinal lymph nodes.  LABS:   Recent Labs    05/27/22 1306  WBC 4.4  HGB 8.9*  HCT 30.3*  PLT 335  Peripheral blood smear-Red cells are microcytic and hypochromic, ovalocytes, few teardrops, few targets, polychromasia mildly increased; white blood cells unremarkable; platelets appear normal in number.  No results for input(s): NA, K, CL, CO2, GLUCOSE, BUN, CREATININE, CALCIUM in the last 72 hours.  RADIOLOGY:  No results found.  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  The hematologic indices and peripheral blood smear are consistent with iron deficiency.  We discussed the likely source of blood loss being AVMs based on his history.  He will submit a urine to evaluate for blood and also complete stool cards.  He will begin ferrous sulfate 325 mg twice daily, stool softener and MiraLAX if needed for constipation.   He will return for lab and follow-up in 4 weeks.  Patient seen with Dr. Benay Spice.    Ned Card, NP 05/27/2022, 2:52 PM   This was a shared visit with Ned Card.  Tony Mcmillan was interviewed and examined.  I reviewed the peripheral blood smear.  He is referred for evaluation of microcytic anemia.  The hematologic indices and peripheral blood smear are consistent with a diagnosis of iron deficiency anemia.  He has a previous history of iron deficiency anemia.  I suspect the anemia is related to chronic GI blood loss, likely due to AVMs.  We we will start him on iron replacement and check stool Hemoccult cards.  I was present for greater than 50% of today's visit.  I performed medical decision making.  Julieanne Manson, MD

## 2022-05-30 ENCOUNTER — Other Ambulatory Visit: Payer: Self-pay | Admitting: *Deleted

## 2022-06-02 DIAGNOSIS — D509 Iron deficiency anemia, unspecified: Secondary | ICD-10-CM | POA: Diagnosis not present

## 2022-06-03 DIAGNOSIS — D509 Iron deficiency anemia, unspecified: Secondary | ICD-10-CM | POA: Diagnosis not present

## 2022-06-04 ENCOUNTER — Other Ambulatory Visit (HOSPITAL_BASED_OUTPATIENT_CLINIC_OR_DEPARTMENT_OTHER): Payer: Self-pay

## 2022-06-04 ENCOUNTER — Telehealth: Payer: Self-pay

## 2022-06-04 DIAGNOSIS — Z862 Personal history of diseases of the blood and blood-forming organs and certain disorders involving the immune mechanism: Secondary | ICD-10-CM

## 2022-06-04 DIAGNOSIS — D509 Iron deficiency anemia, unspecified: Secondary | ICD-10-CM | POA: Diagnosis not present

## 2022-06-04 LAB — OCCULT BLOOD X 1 CARD TO LAB, STOOL
Fecal Occult Bld: NEGATIVE
Fecal Occult Bld: NEGATIVE
Fecal Occult Bld: NEGATIVE

## 2022-06-04 NOTE — Telephone Encounter (Signed)
Patient gave verbal understanding and had no further questions or concerns  

## 2022-06-04 NOTE — Telephone Encounter (Signed)
-----   Message from Owens Shark, NP sent at 06/04/2022  4:03 PM EDT ----- Please let him know stool cards were negative for blood.  Follow-up as scheduled.

## 2022-06-12 ENCOUNTER — Encounter: Payer: Medicare HMO | Admitting: Nurse Practitioner

## 2022-06-12 ENCOUNTER — Other Ambulatory Visit: Payer: Medicare HMO

## 2022-06-13 ENCOUNTER — Other Ambulatory Visit: Payer: Self-pay | Admitting: Physician Assistant

## 2022-06-21 ENCOUNTER — Encounter: Payer: Self-pay | Admitting: Internal Medicine

## 2022-06-28 ENCOUNTER — Inpatient Hospital Stay (HOSPITAL_BASED_OUTPATIENT_CLINIC_OR_DEPARTMENT_OTHER): Payer: Medicare HMO | Admitting: Oncology

## 2022-06-28 ENCOUNTER — Inpatient Hospital Stay: Payer: Medicare HMO | Attending: Nurse Practitioner

## 2022-06-28 VITALS — BP 112/64 | HR 61 | Temp 98.1°F | Resp 18 | Ht 62.0 in | Wt 132.6 lb

## 2022-06-28 DIAGNOSIS — M069 Rheumatoid arthritis, unspecified: Secondary | ICD-10-CM | POA: Insufficient documentation

## 2022-06-28 DIAGNOSIS — Z862 Personal history of diseases of the blood and blood-forming organs and certain disorders involving the immune mechanism: Secondary | ICD-10-CM | POA: Diagnosis not present

## 2022-06-28 DIAGNOSIS — J449 Chronic obstructive pulmonary disease, unspecified: Secondary | ICD-10-CM | POA: Diagnosis not present

## 2022-06-28 DIAGNOSIS — D509 Iron deficiency anemia, unspecified: Secondary | ICD-10-CM | POA: Diagnosis present

## 2022-06-28 LAB — CBC WITH DIFFERENTIAL (CANCER CENTER ONLY)
Abs Immature Granulocytes: 0.01 10*3/uL (ref 0.00–0.07)
Basophils Absolute: 0 10*3/uL (ref 0.0–0.1)
Basophils Relative: 1 %
Eosinophils Absolute: 0.1 10*3/uL (ref 0.0–0.5)
Eosinophils Relative: 2 %
HCT: 35.8 % — ABNORMAL LOW (ref 39.0–52.0)
Hemoglobin: 11.4 g/dL — ABNORMAL LOW (ref 13.0–17.0)
Immature Granulocytes: 0 %
Lymphocytes Relative: 18 %
Lymphs Abs: 0.7 10*3/uL (ref 0.7–4.0)
MCH: 26.1 pg (ref 26.0–34.0)
MCHC: 31.8 g/dL (ref 30.0–36.0)
MCV: 81.9 fL (ref 80.0–100.0)
Monocytes Absolute: 0.5 10*3/uL (ref 0.1–1.0)
Monocytes Relative: 13 %
Neutro Abs: 2.6 10*3/uL (ref 1.7–7.7)
Neutrophils Relative %: 66 %
Platelet Count: 260 10*3/uL (ref 150–400)
RBC: 4.37 MIL/uL (ref 4.22–5.81)
RDW: 21.3 % — ABNORMAL HIGH (ref 11.5–15.5)
WBC Count: 4 10*3/uL (ref 4.0–10.5)
nRBC: 0 % (ref 0.0–0.2)

## 2022-06-28 LAB — FERRITIN: Ferritin: 30 ng/mL (ref 24–336)

## 2022-06-28 NOTE — Progress Notes (Signed)
  [facility] OFFICE PROGRESS NOTE   Diagnosis: Iron deficiency anemia  INTERVAL HISTORY:   Mr. Nazari returns as scheduled.  He is taking ferrous sulfate twice daily.  He is tolerating the iron well.  No constipation, diarrhea, or bleeding.  No new complaint.  He has not noted a change in his energy level since beginning iron.  Objective:  Vital signs in last 24 hours:  Blood pressure 112/64, pulse 61, temperature 98.1 F (36.7 C), temperature source Oral, resp. rate 18, height '5\' 2"'$  (1.575 m), weight 132 lb 9.6 oz (60.1 kg), SpO2 98 %.    Resp: Lungs clear bilaterally Cardio: Regular rate and rhythm GI: No hepatosplenomegaly, nontender, no mass Vascular: No leg edema  Lab Results:  Lab Results  Component Value Date   WBC 4.0 06/28/2022   HGB 11.4 (L) 06/28/2022   HCT 35.8 (L) 06/28/2022   MCV 81.9 06/28/2022   PLT 260 06/28/2022   NEUTROABS 2.6 06/28/2022    CMP  Lab Results  Component Value Date   NA 138 05/09/2022   K 4.7 05/09/2022   CL 102 05/09/2022   CO2 23 05/09/2022   GLUCOSE 103 (H) 05/09/2022   BUN 17 05/09/2022   CREATININE 1.19 05/09/2022   CALCIUM 9.1 05/09/2022   PROT 7.2 05/09/2022   ALBUMIN 4.4 05/09/2022   AST 20 05/09/2022   ALT 14 05/09/2022   ALKPHOS 65 05/09/2022   BILITOT 0.3 05/09/2022   GFRNONAA >60 01/06/2022   GFRAA 54 (L) 12/27/2020     Medications: I have reviewed the patient's current medications.   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  He has been maintained on iron for the past month.  The hemoglobin has improved.  The ferritin is in the low normal range.  I recommend he continue ferrous sulfate.  I will defer further evaluation for a source of blood loss to his primary provider.  He will return for an office and lab visit in  3 months.  Betsy Coder, MD  06/28/2022  11:21 AM

## 2022-07-03 ENCOUNTER — Other Ambulatory Visit: Payer: Medicare HMO

## 2022-07-03 DIAGNOSIS — I25119 Atherosclerotic heart disease of native coronary artery with unspecified angina pectoris: Secondary | ICD-10-CM

## 2022-07-03 LAB — COMPREHENSIVE METABOLIC PANEL
ALT: 20 IU/L (ref 0–44)
AST: 20 IU/L (ref 0–40)
Albumin/Globulin Ratio: 1.6 (ref 1.2–2.2)
Albumin: 4.2 g/dL (ref 3.8–4.8)
Alkaline Phosphatase: 61 IU/L (ref 44–121)
BUN/Creatinine Ratio: 12 (ref 10–24)
BUN: 13 mg/dL (ref 8–27)
Bilirubin Total: 0.2 mg/dL (ref 0.0–1.2)
CO2: 24 mmol/L (ref 20–29)
Calcium: 9.4 mg/dL (ref 8.6–10.2)
Chloride: 103 mmol/L (ref 96–106)
Creatinine, Ser: 1.12 mg/dL (ref 0.76–1.27)
Globulin, Total: 2.7 g/dL (ref 1.5–4.5)
Glucose: 98 mg/dL (ref 70–99)
Potassium: 4.6 mmol/L (ref 3.5–5.2)
Sodium: 138 mmol/L (ref 134–144)
Total Protein: 6.9 g/dL (ref 6.0–8.5)
eGFR: 66 mL/min/{1.73_m2} (ref >59)

## 2022-07-03 LAB — LIPID PANEL
Chol/HDL Ratio: 2.4 ratio (ref 0.0–5.0)
Cholesterol, Total: 142 mg/dL (ref 100–199)
HDL: 58 mg/dL (ref >39)
LDL Chol Calc (NIH): 64 mg/dL (ref 0–99)
Triglycerides: 113 mg/dL (ref 0–149)
VLDL Cholesterol Cal: 20 mg/dL (ref 5–40)

## 2022-07-05 ENCOUNTER — Other Ambulatory Visit: Payer: Self-pay | Admitting: Physician Assistant

## 2022-07-10 ENCOUNTER — Other Ambulatory Visit: Payer: Self-pay | Admitting: Gastroenterology

## 2022-08-06 ENCOUNTER — Ambulatory Visit (INDEPENDENT_AMBULATORY_CARE_PROVIDER_SITE_OTHER): Payer: Medicare HMO | Admitting: Adult Health

## 2022-08-06 ENCOUNTER — Ambulatory Visit: Payer: Medicare HMO | Admitting: Physician Assistant

## 2022-08-06 ENCOUNTER — Encounter: Payer: Self-pay | Admitting: Adult Health

## 2022-08-06 DIAGNOSIS — J449 Chronic obstructive pulmonary disease, unspecified: Secondary | ICD-10-CM | POA: Diagnosis not present

## 2022-08-06 NOTE — Progress Notes (Signed)
$'@Patient'K$  ID: Tony Mcmillan, male    DOB: 03/13/1941, 81 y.o.   MRN: [id number]  Chief Complaint  Patient presents with   Follow-up    Referring provider: Marcellina Millin  HPI: 81 yo male former smoker followed for COPD with Emphysema  Previously on nocturnal Oxygen  Medical history significant for RA   TEST/EVENTS :  HRCT chest January 2022 negative for ILD, elevation of right hemidiaphragm with associated scarring and atelectasis in the right base, emphysema  08/06/2022 Follow up : COPD with Emphysema  Patient presents for a follow-up visit.  Last seen November 2022.  Patient says overall he is doing okay.  He has had no flare of cough or wheezing.  Says he does get short of breath with heavy activities.  He remains on Bevespi inhaler twice daily.  Has had no increased albuterol use.  Says occasionally his mucus is thick. Lives at home in apartment alone. . Drives. Does all house chores. Prevnar 20, COVID-19 boosters, influenza is up-to-date.  We discussed the fall flu shot.  No Known Allergies  Immunization History  Administered Date(s) Administered   Influenza, High Dose Seasonal PF 07/23/2017, 08/05/2019, 08/30/2020, 08/22/2021   Influenza,inj,Quad PF,6+ Mos 08/01/2017   Influenza-Unspecified 10/30/2015, 09/13/2016, 07/30/2018, 08/05/2019   PFIZER Comirnaty(Gray Top)Covid-19 Tri-Sucrose Vaccine 05/12/2021   PFIZER(Purple Top)SARS-COV-2 Vaccination 01/28/2020, 02/18/2020, 09/19/2020   PNEUMOCOCCAL CONJUGATE-20 08/17/2021   Pfizer Covid-19 Vaccine Bivalent Booster 42yr & up 11/05/2021   Pneumococcal Conjugate-13 12/05/2015   Pneumococcal Polysaccharide-23 04/23/2017   Tdap 12/09/2015   Zoster Recombinat (Shingrix) 10/26/2021   Zoster, Live 12/09/2015    Past Medical History:  Diagnosis Date   3-vessel coronary artery disease 03/10/2020   Allergy    Anemia    Aortic valve calcification 03/10/2020   Arthritis    Asymptomatic microscopic hematuria 04/03/2016    [facility]     Atypical chest pain    a. 09/2016 MV: EF 63%, normal perfusion.   Benign prostatic hyperplasia 04/03/2016   Asymptomatic. Normal PSA and [facility] 01/[facility].     Bilateral carotid bruits 02/09/2019   Bladder wall thickening 03/10/2020   CAD (coronary artery disease)    Coronary artery calcification on Chest CT in 09/2016 and 01/2019 // Myoview 10/17: normal perfusion // Myoview 01/2019: no ischemia or infarction, EF 58 // Coronary CTA 03/2020: Calcium Score 1067; non-obstructive CAD [oLM 1-24, LAD ost 25049, mid and dist 1-24; D1 1-24; LCx prox and mid 1-24, OM1 and OM2 25-49, RCA and PLB 1-24; PLA 1-24]    Carotid bruit    Carotid Dopplers 01/2019: no ICA stenosis bilaterally    Colonic polyp 02/23/2016   COVID-19    DDD (degenerative disc disease), 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 L4-5 and LD3-O6   Diastolic dysfunction    a. 2015 Echo: EF >55%;  b. 09/2016 Echo: EF 55-60%, no rwma, Gr1 DD, triv AI/MR, mild TR, PASP 259mg.   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   On CT 01/2016    Palpitations    Echo 10/17: Mild focal basal septal hypertrophy, EF 55-60, no RWMA, Gr 1 DD,  trivial MR, normal RVSF, mild TR, PASP 28 // Event monitor 01/2019: NSR, PACs   Prediabetes 10/29/2018   Pulmonary emphysema ([place]) 12/18/2016   Renal cyst, left 04/03/2016   On CT, benign    Rheumatoid arthritis ([place])    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 30 tablet 0   albuterol (VENTOLIN HFA) 108 (90 Base) MCG/ACT inhaler Inhale 2 puffs into the lungs every 6 (six) hours as needed for wheezing or shortness of breath. 8 g 6   Ascorbic Acid (VITAMIN C PO) Take 1 tablet by mouth daily.     atorvastatin (LIPITOR) 40 MG tablet TAKE 1 TABLET BY MOUTH EVERY DAY 90 tablet 3   Calcium Carb-Cholecalciferol (CALCIUM-VITAMIN D3) 600-400 MG-UNIT TABS Take 1 tablet by mouth 2 (two) times daily.     docusate sodium (COLACE) 250 MG capsule Take 250 mg by mouth daily.     ferrous sulfate 325 (65 FE) MG EC tablet Take 1 tablet (325 mg total) by mouth 2 (two) times daily.     Glycopyrrolate-Formoterol (BEVESPI AEROSPHERE) 9-4.8 MCG/ACT AERO Inhale 2 puffs into the lungs in the morning and at bedtime. 32.1 g 3   inFLIXimab (REMICADE IV) Inject 100 mg into the vein as needed (for arthrithis).     Iron-FA-B Cmp-C-Biot-Probiotic (FUSION PLUS) CAPS Take 1 capsule by mouth every other day. Take with a full meal 60 capsule 3   isosorbide mononitrate (IMDUR) 30 MG 24 hr tablet Take 0.5 tablets (15 mg total) by mouth daily. 45 tablet 3   leflunomide (ARAVA) 20 MG tablet Take 20 mg by mouth daily.     metoprolol succinate (TOPROL-XL) 25 MG 24 hr tablet Take 1 tablet (25 mg total) by mouth daily. Please keep upcoming appt. With Richardson Dopp Aug. 15th in order to receive future refills. Thank You. 90 tablet 1   MYRBETRIQ 50 MG TB24 tablet Take 50 mg by mouth daily.      nitroGLYCERIN (NITROSTAT) 0.4 MG SL tablet PLACE 1 TABLET  UNDER THE TONGUE EVERY 5 MINUTES AS NEEDED FOR CHEST PAIN. 25 tablet 6   pantoprazole (PROTONIX) 40 MG tablet TAKE 1 TABLET BY MOUTH EVERY DAY 90 tablet 0   polyethylene glycol (MIRALAX) 17 g packet Take 17 g by mouth daily as needed. 14 each 0   No facility-administered medications prior to visit.     Review of Systems:   Constitutional:   No  weight loss, night sweats,  Fevers, chills,  +fatigue, or  lassitude.  HEENT:   No headaches,  Difficulty swallowing,  Tooth/dental problems, or  Sore throat,                No sneezing, itching, ear ache, nasal congestion, post nasal drip,   CV:  No chest pain,  Orthopnea, PND, swelling in lower extremities, anasarca, dizziness, palpitations, syncope.   GI  No  heartburn, indigestion, abdominal pain, nausea, vomiting, diarrhea, change in bowel habits, loss of appetite, bloody stools.   Resp: g.  No chest wall deformity  Skin: no rash or lesions.  GU: no dysuria, change in color of urine, no urgency or frequency.  No flank pain, no hematuria   MS:  No joint pain or swelling.  No decreased range of motion.  No back pain.    Physical Exam  BP (!) 106/50 (BP Location: Left Arm, Patient Position: Sitting, Cuff Size: Normal)   Pulse 70   Temp (!) 97.4 F (36.3 C) (Oral)   Ht '5\' 2"'$  (1.575 m)   Wt 133 lb 9.6 oz (60.6 kg)   SpO2 95%   BMI 24.44 kg/m   GEN: A/Ox3; pleasant , NAD, well nourished    HEENT:  [place]/AT,  NOSE-clear, THROAT-clear, no lesions, no postnasal drip or exudate noted.   NECK:  Supple w/ fair ROM; no JVD; normal carotid impulses w/o bruits; no thyromegaly or nodules palpated; no lymphadenopathy.    RESP  Clear  P & A; w/o, wheezes/ rales/ or rhonchi. no accessory muscle use, no dullness to percussion  CARD:  RRR, no m/r/g, no peripheral edema, pulses intact, no cyanosis or clubbing.  GI:   Soft & nt; nml bowel sounds; no organomegaly or masses detected.   Musco: Warm bil, no deformities or joint swelling noted.    Neuro: alert, no focal deficits noted.    Skin: Warm, no lesions or rashes    Lab Results:    BMET   BNP No results found for: "BNP"  ProBNP   Imaging: No results found.       Latest Ref Rng & Units 11/20/2020   12:36 PM 11/26/2016    9:25 AM  PFT Results  FVC-Pre L 1.91  1.79  P  FVC-Predicted Pre % 71  65  P  FVC-Post L  1.81  P  FVC-Predicted Post %  65  P  Pre FEV1/FVC % % 81  81  P  Post FEV1/FCV % %  82  P  FEV1-Pre L 1.55  1.46  P  FEV1-Predicted Pre % 80  73  P  FEV1-Post L  1.48  P  DLCO uncorrected ml/min/mmHg 11.83  12.16  P  DLCO UNC% % 60  53  P  DLCO corrected ml/min/mmHg 12.43  12.31  P  DLCO COR %Predicted % 63  53  P  DLVA Predicted % 99  98  P  TLC L  3.64  P  TLC % Predicted %  64  P  RV % Predicted %  86  P    P Preliminary result    No results found for: "NITRICOXIDE"      Assessment & Plan:   No problem-specific Assessment & Plan notes found for this encounter.     Rexene Edison, NP 08/06/2022

## 2022-08-06 NOTE — Patient Instructions (Signed)
Mucinex DM Twice daily  As needed  cough/congestion  Continue on BEVESPI 2 puffs Twice daily Albuterol inhaler As needed   Follow up with Dr. Chase Caller in 6 months and As needed   Please contact office for sooner follow up if symptoms do not improve or worsen or seek emergency care

## 2022-08-08 DIAGNOSIS — J449 Chronic obstructive pulmonary disease, unspecified: Secondary | ICD-10-CM | POA: Insufficient documentation

## 2022-08-08 NOTE — Assessment & Plan Note (Addendum)
COPD appears compensated on present regimen.  No changes Immunizations appear to be up-to-date Advised on a flu shot later this fall.  Also discussed RSV vaccine that is coming up this fall  Plan  Patient Instructions  Mucinex DM Twice daily  As needed  cough/congestion  Continue on BEVESPI 2 puffs Twice daily Albuterol inhaler As needed   Follow up with Dr. Chase Caller in 6 months and As needed   Please contact office for sooner follow up if symptoms do not improve or worsen or seek emergency care

## 2022-08-14 ENCOUNTER — Other Ambulatory Visit: Payer: Self-pay | Admitting: Physician Assistant

## 2022-08-28 ENCOUNTER — Ambulatory Visit: Payer: Medicare HMO | Attending: Physician Assistant | Admitting: Physician Assistant

## 2022-08-28 ENCOUNTER — Encounter: Payer: Self-pay | Admitting: Physician Assistant

## 2022-08-28 VITALS — BP 102/62 | HR 82 | Ht 62.0 in | Wt 131.8 lb

## 2022-08-28 DIAGNOSIS — I25119 Atherosclerotic heart disease of native coronary artery with unspecified angina pectoris: Secondary | ICD-10-CM | POA: Diagnosis not present

## 2022-08-28 DIAGNOSIS — J449 Chronic obstructive pulmonary disease, unspecified: Secondary | ICD-10-CM

## 2022-08-28 DIAGNOSIS — E782 Mixed hyperlipidemia: Secondary | ICD-10-CM | POA: Diagnosis not present

## 2022-08-28 MED ORDER — ASPIRIN 81 MG PO TBEC: 81.0000 mg | DELAYED_RELEASE_TABLET | Freq: Every day | ORAL | 12 refills | Status: DC

## 2022-08-28 NOTE — Patient Instructions (Signed)
Medication Instructions:  Your physician has recommended you make the following change in your medication:   START Aspirin 81 mg taking 1 daily   *If you need a refill on your cardiac medications before your next appointment, please call your pharmacy*   Lab Work: None ordered  If you have labs (blood work) drawn today and your tests are completely normal, you will receive your results only by: [place] (if you have MyChart) OR A paper copy in the mail If you have any lab test that is abnormal or we need to change your treatment, we will call you to review the results.   Testing/Procedures: None ordered   Follow-Up: At [facility], you and your health needs are our priority.  As part of our continuing mission to provide you with exceptional heart care, we have created designated Provider Care Teams.  These Care Teams include your primary Cardiologist (physician) and Advanced Practice Providers (APPs -  Physician Assistants and Nurse Practitioners) who all work together to provide you with the care you need, when you need it.  We recommend signing up for the patient portal called "MyChart".  Sign up information is provided on this After Visit Summary.  MyChart is used to connect with patients for Virtual Visits (Telemedicine).  Patients are able to view lab/test results, encounter notes, upcoming appointments, etc.  Non-urgent messages can be sent to your provider as well.   To learn more about what you can do with MyChart, go to [email or web address].    Your next appointment:   1 year(s)  The format for your next appointment:   In Person  Provider:   Richardson Dopp, PA-C         Other Instructions   Important Information About Sugar

## 2022-08-28 NOTE — Assessment & Plan Note (Signed)
Followed by pulmonology 

## 2022-08-28 NOTE — Progress Notes (Signed)
Cardiology Office Note:    Date:  08/28/2022   ID:  Tony Mcmillan, DOB 13-Sep-1941, MRN [id number]  PCP:  Tony Mcmillan  [place] Prov[facility] Cardiologist:  Tony Bergeron, MD Cardiology APP:  Tony Mcmillan    Referring MD: Tony Mcmillan   Chief Complaint:  F/u for CAD    Patient Profile: Aortic valve calcification Coronary artery disease  Coronary artery calcification on Chest CT in 09/2016 and 01/2019 Myoview 10/17: normal perfusion Echocardiogram 10/14/16:  Mild focal basal septal hypertrophy, EF 55-60, no RWMA, Gr 1 DD, trivial MR, normal RVSF, mild TR, PASP 28 Myoview 01/2019: no ischemia or infarction, EF 58 Carotid US 02/15/2019:  No ICA stenosis  Coronary CTA 03/2020: Calcium Score 1067; non-obstructive CAD [oLM 1-24, LAD ost 25-49, mid and dist 1-24; D1 1-24; LCx prox and mid 1-24, OM1 and OM2 25-49, RCA and PLB 1-24; PLA 1-24]  Myoview 03/01/21:  EF 72, inf artifact, normal perfusion, low risk Chest pain:  Rx w Toprol XL, Imdur  PACs Monitor 01/2019: NSR, PACs Hyperlipidemia  Emphysema Iron deficiency anemia  Sees heme; 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 Rheumatoid arthritis  GERD   Prior CV Studies: Myoview 03/01/21 EF 72, inf artifact, normal perfusion, low risk    Coronary CTA 03/2020:  Calcium Score 1067; non-obstructive CAD [oLM 1-24, LAD ost 25-49, mid and dist 1-24; D1 1-24; LCx prox and mid 1-24, OM1 and OM2 25-49, RCA and PLB 1-24; PLA 1-24]    Event Monitor 02/16/2019 NSR, PACs   Myoview 02/16/2019 EF 58, no ischemia or infarction   Carotid US 02/15/2019 No ICA stenosis    Myoview 10/14/16 EF 63, normal perfusion; Low Risk   Echocardiogram 10/14/16 Mild focal basal septal hypertrophy, EF 55-60, no RWMA, Gr 1 DD, trivial MR, normal RVSF, mild TR, PASP 28  History of Present Illness:   Tony Mcmillan is a 81 y.o. male with the above problem list.  He was last seen in Jan 2023.   He returns for follow-up.  He is doing well  without significant chest pain, syncope, orthopnea, leg edema.  He has chronic shortness of breath related to COPD which is unchanged.    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.     Atypical chest pain    a. 09/2016 MV: EF 63%, normal perfusion.   Benign prostatic hyperplasia 04/03/2016   Asymptomatic. Normal PSA and exam by Dr. Pilar [facility] 01/2016.     Bilateral carotid bruits 02/09/2019   Bladder wall thickening 03/10/2020   CAD (coronary artery disease)    Coronary artery calcification on Chest CT in 09/2016 and 01/2019 // Myoview 10/17: normal perfusion // Myoview 01/2019: no ischemia or infarction, EF 58 // Coronary CTA 03/2020: Calcium Score 1067; non-obstructive CAD [oLM 1-24, LAD ost 25049, mid and dist 1-24; D1 1-24; LCx prox and mid 1-24, OM1 and OM2 25-49, RCA and PLB 1-24; PLA 1-24]    Carotid bruit    Carotid Dopplers 01/2019: no ICA stenosis bilaterally    Colonic polyp 02/23/2016   COVID-19    DDD (degenerative disc disease), 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 L4-5 and Q0-G8    Diastolic dysfunction    a. 2015 Echo: EF >55%;  b. 09/2016 Echo: EF 55-60%, no rwma, Gr1 DD, triv AI/MR, mild TR, PASP 1mHg.   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   On CT 01/2016    Palpitations    Echo 10/17: Mild focal basal septal hypertrophy, EF 55-60, no RWMA, Gr 1 DD, trivial MR, normal RVSF, mild TR, PASP 28 // Event monitor 01/2019:  NSR, PACs   Prediabetes 10/29/2018   Pulmonary emphysema ([facility] 12/18/2016   Renal cyst, left 04/03/2016   On CT, benign    Rheumatoid arthritis ([facility]    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[place] 11/04/2016   -RF,-CCP,-1433eta, erosive disease with contractures   Tubular adenoma of colon 01/2016   Vitamin D deficiency 11/04/2016   Current Medications: Current Meds  Medication Sig   acetaminophen (TYLENOL) 500 MG tablet Take 2 tablets (1,000 mg total) by mouth every 6 (six) hours as needed.   albuterol (VENTOLIN HFA) 108 (90 Base) MCG/ACT inhaler Inhale 2 puffs into the lungs every 6 (six) hours as needed for wheezing or shortness of breath.   Ascorbic Acid (VITAMIN C PO) Take 1 tablet by mouth daily.   aspirin EC 81 MG tablet Take 1 tablet (81 mg total) by mouth daily. Swallow whole.   atorvastatin (LIPITOR) 40 MG tablet TAKE 1 TABLET BY MOUTH EVERY DAY   Calcium Carb-Cholecalciferol (CALCIUM-VITAMIN D3) 600-400 MG-UNIT TABS Take 1 tablet by mouth 2 (two) times daily.   docusate sodium (COLACE) 250 MG capsule Take 250 mg by mouth daily.   ferrous sulfate 325 (65 FE) MG EC tablet Take 1 tablet (325 mg total) by mouth 2 (two) times daily.   Glycopyrrolate-Formoterol (BEVESPI AEROSPHERE) 9-4.8 MCG/ACT AERO Inhale 2 puffs into the lungs in the morning and at bedtime.   inFLIXimab (REMICADE IV) Inject 100 mg into the vein as needed (for arthrithis).   Iron-FA-B Cmp-C-Biot-Probiotic (FUSION PLUS) CAPS Take 1 capsule by mouth every other day. Take with a full meal   isosorbide mononitrate (IMDUR) 30 MG 24 hr tablet TAKE 1/2 OF A TABLET (15 MG TOTAL) BY MOUTH DAILY   leflunomide (ARAVA) 20 MG tablet Take 20 mg by mouth daily.   metoprolol succinate (TOPROL-XL) 25 MG 24 hr tablet Take 1 tablet (25 mg total) by mouth daily. Please keep upcoming appt. With SRichardson DoppAug. 15th in order to receive future refills. Thank You.   MYRBETRIQ 50 MG TB24 tablet Take 50 mg by mouth daily.     nitroGLYCERIN (NITROSTAT) 0.4 MG SL tablet PLACE 1 TABLET UNDER THE TONGUE EVERY 5 MINUTES AS NEEDED FOR CHEST PAIN.   pantoprazole (PROTONIX) 40 MG tablet TAKE 1 TABLET BY MOUTH EVERY DAY   polyethylene glycol (MIRALAX) 17 g packet Take 17 g by mouth daily as needed.    Allergies:   Patient has no known allergies.   Social History   Tobacco Use   Smoking status: Former    Packs/day: 1.00    Years: 17.00    Total pack years: 17.00    Types: Cigarettes    Quit date: 12/23/1993    Years since quitting: 28.6   Smokeless tobacco: Never  Vaping Use   Vaping Use: Never used  Substance  Use Topics   Alcohol use: No    Alcohol/week: 0.0 standard drinks of alcohol   Drug use: No    Family Hx: The patient's family history includes Diabetes in his brother, father, and sister; Heart disease in his brother and father; Hyperlipidemia in his sister; Hypertension in his father and mother; Stroke in his brother. There is no history of Colon polyps, Esophageal cancer, Stomach cancer, Rectal cancer, or Colon cancer.  Review of Systems  Cardiovascular:  Negative for claudication.    EKGs/Labs/Other Test Reviewed:    EKG:  EKG is not ordered today.  The ekg ordered today demonstrates n/a  Recent Labs: 09/26/2021: NT-Pro BNP 66 05/09/2022: TSH 1.630 06/28/2022: Hemoglobin 11.4; Platelet Count 260 07/03/2022: ALT 20; BUN 13; Creatinine, Ser 1.12; Potassium 4.6; Sodium 138   Recent Lipid Panel Recent Labs    07/03/22 0914  CHOL 142  TRIG 113  HDL 58  LDLCALC 64     Risk Assessment/Calculations/Metrics:              Physical Exam:    VS:  BP 102/62   Pulse 82   Ht '5\' 2"'$  (1.575 m)   Wt 131 lb 12.8 oz (59.8 kg)   BMI 24.11 kg/m     Wt Readings from Last 3 Encounters:  08/28/22 131 lb 12.8 oz (59.8 kg)  08/06/22 133 lb 9.6 oz (60.6 kg)  06/28/22 132 lb 9.6 oz (60.1 kg)    Constitutional:      Appearance: Healthy appearance. Not in distress.  Neck:     Vascular: JVD normal.   Pulmonary:     Effort: Pulmonary effort is normal.     Breath sounds: No wheezing. No rales.  Cardiovascular:     Normal rate. Regular rhythm. Normal S1. Normal S2.      Murmurs: There is no murmur.  Edema:    Peripheral edema absent.  Abdominal:     Palpations: Abdomen is soft.  Skin:    General: Skin is warm and dry.  Neurological:     General: No focal deficit present.     Mental Status: Alert and oriented to person, place and time.         ASSESSMENT & PLAN:   CAD (coronary artery disease) Mild to mod non-obstructive CAD on CCTA in 2021.  Nuclear stress test in March 2022 was low risk.  He has fairly chronic chest pain.  Recently, he has not had significant symptoms.  Continue current therapy with Lipitor, Imdur, Toprol-XL.  Follow-up in 1 year.  Mixed hyperlipidemia Lipids optimal on Lipitor.  COPD (chronic obstructive pulmonary disease) ([place]) Followed by pulmonology.           Dispo:  Return in about 1 year (around 08/29/2023) for Routine Follow Up, w/ Richardson Dopp, PA-C.   Medication Adjustments/Labs and Tests Ordered: Current medicines are reviewed at length with the patient today.  Concerns regarding medicines are outlined above.  Tests Ordered: No orders of the defined types were placed in this encounter.  Medication Changes: Meds ordered this encounter  Medications   aspirin EC 81 MG tablet    Sig: Take 1 tablet (81 mg total) by mouth daily. Swallow whole.    Dispense:  30 tablet    Refill:  [address] Richardson Dopp, PA-C  08/28/2022 1:33 PM    [place], [place], [place]  [address] Phone: [phone]; Fax: [phone]

## 2022-08-28 NOTE — Assessment & Plan Note (Signed)
Lipids optimal on Lipitor.

## 2022-08-28 NOTE — Assessment & Plan Note (Signed)
Mild to mod non-obstructive CAD on CCTA in 2021.  Nuclear stress test in March 2022 was low risk.  He has fairly chronic chest pain.  Recently, he has not had significant symptoms.  Continue current therapy with Lipitor, Imdur, Toprol-XL.  Follow-up in 1 year.

## 2022-09-27 ENCOUNTER — Inpatient Hospital Stay (HOSPITAL_BASED_OUTPATIENT_CLINIC_OR_DEPARTMENT_OTHER): Payer: Medicare HMO | Admitting: Nurse Practitioner

## 2022-09-27 ENCOUNTER — Inpatient Hospital Stay: Payer: Medicare HMO | Attending: Nurse Practitioner

## 2022-09-27 ENCOUNTER — Encounter: Payer: Self-pay | Admitting: Nurse Practitioner

## 2022-09-27 VITALS — BP 114/56 | HR 66 | Temp 98.1°F | Resp 20 | Ht 62.0 in | Wt 130.0 lb

## 2022-09-27 DIAGNOSIS — Z862 Personal history of diseases of the blood and blood-forming organs and certain disorders involving the immune mechanism: Secondary | ICD-10-CM

## 2022-09-27 DIAGNOSIS — I2510 Atherosclerotic heart disease of native coronary artery without angina pectoris: Secondary | ICD-10-CM | POA: Insufficient documentation

## 2022-09-27 DIAGNOSIS — J449 Chronic obstructive pulmonary disease, unspecified: Secondary | ICD-10-CM | POA: Insufficient documentation

## 2022-09-27 DIAGNOSIS — M069 Rheumatoid arthritis, unspecified: Secondary | ICD-10-CM | POA: Diagnosis not present

## 2022-09-27 DIAGNOSIS — D509 Iron deficiency anemia, unspecified: Secondary | ICD-10-CM | POA: Diagnosis present

## 2022-09-27 LAB — CBC WITH DIFFERENTIAL (CANCER CENTER ONLY)
Abs Immature Granulocytes: 0.01 10*3/uL (ref 0.00–0.07)
Basophils Absolute: 0 10*3/uL (ref 0.0–0.1)
Basophils Relative: 1 %
Eosinophils Absolute: 0.2 10*3/uL (ref 0.0–0.5)
Eosinophils Relative: 5 %
HCT: 38.2 % — ABNORMAL LOW (ref 39.0–52.0)
Hemoglobin: 12.5 g/dL — ABNORMAL LOW (ref 13.0–17.0)
Immature Granulocytes: 0 %
Lymphocytes Relative: 27 %
Lymphs Abs: 1 10*3/uL (ref 0.7–4.0)
MCH: 27.9 pg (ref 26.0–34.0)
MCHC: 32.7 g/dL (ref 30.0–36.0)
MCV: 85.3 fL (ref 80.0–100.0)
Monocytes Absolute: 0.6 10*3/uL (ref 0.1–1.0)
Monocytes Relative: 16 %
Neutro Abs: 1.8 10*3/uL (ref 1.7–7.7)
Neutrophils Relative %: 51 %
Platelet Count: 240 10*3/uL (ref 150–400)
RBC: 4.48 MIL/uL (ref 4.22–5.81)
RDW: 14.9 % (ref 11.5–15.5)
WBC Count: 3.6 10*3/uL — ABNORMAL LOW (ref 4.0–10.5)
nRBC: 0 % (ref 0.0–0.2)

## 2022-09-27 LAB — FERRITIN: Ferritin: 22 ng/mL — ABNORMAL LOW (ref 24–336)

## 2022-09-27 NOTE — Progress Notes (Signed)
  [place] OFFICE PROGRESS NOTE   Diagnosis: Iron deficiency anemia  INTERVAL HISTORY:   Mr. Cui returns as scheduled.  He continues ferrous sulfate once daily.  Overall he tolerates well.  He has occasional mild constipation and takes a stool softener with good relief.  No nausea or vomiting.  He denies bleeding.   Objective:  Vital signs in last 24 hours:  Blood pressure (!) 114/56, pulse 66, temperature 98.1 F (36.7 C), temperature source Oral, resp. rate 20, height '5\' 2"'$  (1.575 m), weight 130 lb (59 kg), SpO2 98 %.    Resp: Lungs clear bilaterally. Cardio: Regular rate and rhythm. GI: Abdomen soft and nontender.  No hepatosplenomegaly.  No mass. Vascular: No leg edema.  Lab Results:  Lab Results  Component Value Date   WBC 3.6 (L) 09/27/2022   HGB 12.5 (L) 09/27/2022   HCT 38.2 (L) 09/27/2022   MCV 85.3 09/27/2022   PLT 240 09/27/2022   NEUTROABS 1.8 09/27/2022    Imaging:  No results found.  Medications: I have reviewed the patient's current medications.  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 Mudgett appears stable.  The hemoglobin continues to improve, now just below normal range.  We will follow-up on the ferritin from today.  He will continue ferrous sulfate once daily.  He will return for lab and follow-up in 4 months.    Charon Akamine ANP/GNP-BC   09/27/2022  1:19 PM

## 2022-10-06 ENCOUNTER — Other Ambulatory Visit: Payer: Self-pay | Admitting: Gastroenterology

## 2022-11-05 ENCOUNTER — Encounter: Payer: Self-pay | Admitting: Internal Medicine

## 2022-11-13 ENCOUNTER — Other Ambulatory Visit: Payer: Self-pay

## 2022-11-13 ENCOUNTER — Other Ambulatory Visit: Payer: Self-pay | Admitting: Primary Care

## 2022-11-13 ENCOUNTER — Telehealth: Payer: Self-pay

## 2022-11-13 ENCOUNTER — Other Ambulatory Visit: Payer: Self-pay | Admitting: Internal Medicine

## 2022-11-13 MED ORDER — BEVESPI AEROSPHERE 9-4.8 MCG/ACT IN AERO: 2.0000 | INHALATION_SPRAY | Freq: Two times a day (BID) | RESPIRATORY_TRACT | 3 refills | Status: DC

## 2022-11-13 NOTE — Telephone Encounter (Signed)
Called and spoke with patient, he states he does not need a refill of his albuterol at this time, however, he needs a refill of his Bevespi.  Verified he wanted it sent to [facility] and he wants a 90 day fill with refills.  Scheduled patient a 6 month f/u with Tammy Parrett NP as he did not have one scheduled and Dr. Chase Caller did not have a schedule available.

## 2022-11-26 ENCOUNTER — Ambulatory Visit: Payer: Medicare HMO | Admitting: Adult Health

## 2022-12-09 ENCOUNTER — Other Ambulatory Visit: Payer: Self-pay | Admitting: Physician Assistant

## 2022-12-13 ENCOUNTER — Ambulatory Visit (INDEPENDENT_AMBULATORY_CARE_PROVIDER_SITE_OTHER): Payer: Medicare HMO

## 2022-12-13 VITALS — BP 110/56 | HR 77 | Temp 97.6°F | Ht 63.0 in | Wt 137.8 lb

## 2022-12-13 DIAGNOSIS — Z0000 Encounter for general adult medical examination without abnormal findings: Secondary | ICD-10-CM | POA: Diagnosis not present

## 2022-12-13 NOTE — Patient Instructions (Signed)
Tony Mcmillan , Thank you for taking time to come for your Medicare Wellness Visit. I appreciate your ongoing commitment to your health goals. Please review the following plan we discussed and let me know if I can assist you in the future.   These are the goals we discussed:  Goals      Patient Stated     12/07/2021, no goals     Patient Stated     12/13/2022, no goals        This is a list of the screening recommended for you and due dates:  Health Maintenance  Topic Date Due   COVID-19 Vaccine (7 - 2023-24 season) 11/18/2022   Medicare Annual Wellness Visit  12/14/2023   DTaP/Tdap/Td vaccine (2 - Td or Tdap) 12/08/2025   Pneumonia Vaccine  Completed   Flu Shot  Completed   Zoster (Shingles) Vaccine  Completed   HPV Vaccine  Aged Out   Colon Cancer Screening  Discontinued    Advanced directives: copy in chart  Conditions/risks identified: none  Next appointment: Follow up in one year for your annual wellness visit.   Preventive Care 80 Years and Older, Male  Preventive care refers to lifestyle choices and visits with your health care provider that can promote health and wellness. What does preventive care include? A yearly physical exam. This is also called an annual well check. Dental exams once or twice a year. Routine eye exams. Ask your health care provider how often you should have your eyes checked. Personal lifestyle choices, including: Daily care of your teeth and gums. Regular physical activity. Eating a healthy diet. Avoiding tobacco and drug use. Limiting alcohol use. Practicing safe sex. Taking low doses of aspirin every day. Taking vitamin and mineral supplements as recommended by your health care provider. What happens during an annual well check? The services and screenings done by your health care provider during your annual well check will depend on your age, overall health, lifestyle risk factors, and family history of disease. Counseling  Your  health care provider may ask you questions about your: Alcohol use. Tobacco use. Drug use. Emotional well-being. Home and relationship well-being. Sexual activity. Eating habits. History of falls. Memory and ability to understand (cognition). Work and work Statistician. Screening  You may have the following tests or measurements: Height, weight, and BMI. Blood pressure. Lipid and cholesterol levels. These may be checked every 5 years, or more frequently if you are over 58 years old. Skin check. Lung cancer screening. You may have this screening every year starting at age 47 if you have a 30-pack-year history of smoking and currently smoke or have quit within the past 15 years. Fecal occult blood test (FOBT) of the stool. You may have this test every year starting at age 37. Flexible sigmoidoscopy or colonoscopy. You may have a sigmoidoscopy every 5 years or a colonoscopy every 10 years starting at age 38. Prostate cancer screening. Recommendations will vary depending on your family history and other risks. Hepatitis C blood test. Hepatitis B blood test. Sexually transmitted disease (STD) testing. Diabetes screening. This is done by checking your blood sugar (glucose) after you have not eaten for a while (fasting). You may have this done every 1-3 years. Abdominal aortic aneurysm (AAA) screening. You may need this if you are a current or former smoker. Osteoporosis. You may be screened starting at age 68 if you are at high risk. Talk with your health care provider about your test results, treatment options,  and if necessary, the need for more tests. Vaccines  Your health care provider may recommend certain vaccines, such as: Influenza vaccine. This is recommended every year. Tetanus, diphtheria, and acellular pertussis (Tdap, Td) vaccine. You may need a Td booster every 10 years. Zoster vaccine. You may need this after age 19. Pneumococcal 13-valent conjugate (PCV13) vaccine. One dose  is recommended after age 16. Pneumococcal polysaccharide (PPSV23) vaccine. One dose is recommended after age 51. Talk to your health care provider about which screenings and vaccines you need and how often you need them. This information is not intended to replace advice given to you by your health care provider. Make sure you discuss any questions you have with your health care provider. Document Released: 01/05/2016 Document Revised: 08/28/2016 Document Reviewed: 10/10/2015 Elsevier Interactive Patient Education  2017 [place] Prevention in the Home Falls can cause injuries. They can happen to people of all ages. There are many things you can do to make your home safe and to help prevent falls. What can I do on the outside of my home? Regularly fix the edges of walkways and driveways and fix any cracks. Remove anything that might make you trip as you walk through a door, such as a raised step or threshold. Trim any bushes or trees on the path to your home. Use bright outdoor lighting. Clear any walking paths of anything that might make someone trip, such as rocks or tools. Regularly check to see if handrails are loose or broken. Make sure that both sides of any steps have handrails. Any raised decks and porches should have guardrails on the edges. Have any leaves, snow, or ice cleared regularly. Use sand or salt on walking paths during winter. Clean up any spills in your garage right away. This includes oil or grease spills. What can I do in the bathroom? Use night lights. Install grab bars by the toilet and in the tub and shower. Do not use towel bars as grab bars. Use non-skid mats or decals in the tub or shower. If you need to sit down in the shower, use a plastic, non-slip stool. Keep the floor dry. Clean up any water that spills on the floor as soon as it happens. Remove soap buildup in the tub or shower regularly. Attach bath mats securely with double-sided non-slip rug  tape. Do not have throw rugs and other things on the floor that can make you trip. What can I do in the bedroom? Use night lights. Make sure that you have a light by your bed that is easy to reach. Do not use any sheets or blankets that are too big for your bed. They should not hang down onto the floor. Have a firm chair that has side arms. You can use this for support while you get dressed. Do not have throw rugs and other things on the floor that can make you trip. What can I do in the kitchen? Clean up any spills right away. Avoid walking on wet floors. Keep items that you use a lot in easy-to-reach places. If you need to reach something above you, use a strong step stool that has a grab bar. Keep electrical cords out of the way. Do not use floor polish or wax that makes floors slippery. If you must use wax, use non-skid floor wax. Do not have throw rugs and other things on the floor that can make you trip. What can I do with my stairs? Do not leave  any items on the stairs. Make sure that there are handrails on both sides of the stairs and use them. Fix handrails that are broken or loose. Make sure that handrails are as long as the stairways. Check any carpeting to make sure that it is firmly attached to the stairs. Fix any carpet that is loose or worn. Avoid having throw rugs at the top or bottom of the stairs. If you do have throw rugs, attach them to the floor with carpet tape. Make sure that you have a light switch at the top of the stairs and the bottom of the stairs. If you do not have them, ask someone to add them for you. What else can I do to help prevent falls? Wear shoes that: Do not have high heels. Have rubber bottoms. Are comfortable and fit you well. Are closed at the toe. Do not wear sandals. If you use a stepladder: Make sure that it is fully opened. Do not climb a closed stepladder. Make sure that both sides of the stepladder are locked into place. Ask someone to  hold it for you, if possible. Clearly mark and make sure that you can see: Any grab bars or handrails. First and last steps. Where the edge of each step is. Use tools that help you move around (mobility aids) if they are needed. These include: Canes. Walkers. Scooters. Crutches. Turn on the lights when you go into a dark area. Replace any light bulbs as soon as they burn out. Set up your furniture so you have a clear path. Avoid moving your furniture around. If any of your floors are uneven, fix them. If there are any pets around you, be aware of where they are. Review your medicines with your doctor. Some medicines can make you feel dizzy. This can increase your chance of falling. Ask your doctor what other things that you can do to help prevent falls. This information is not intended to replace advice given to you by your health care provider. Make sure you discuss any questions you have with your health care provider. Document Released: 10/05/2009 Document Revised: 05/16/2016 Document Reviewed: 01/13/2015 Elsevier Interactive Patient Education  2017 [employer].

## 2022-12-13 NOTE — Progress Notes (Signed)
Subjective:   Tony Mcmillan is a 81 y.o. male who presents for Medicare Annual/Subsequent preventive examination.  Review of Systems     Cardiac Risk Factors include: advanced age (>4mn, >>54women);dyslipidemia;male gender     Objective:    Today's Vitals   12/13/22 1140  BP: (!) 110/56  Pulse: 77  Temp: 97.6 F (36.4 C)  TempSrc: Oral  SpO2: 94%  Weight: 137 lb 12.8 oz (62.5 kg)  Height: '5\' 3"'$  (1.6 m)   Body mass index is 24.41 kg/m.     12/13/2022   11:51 AM 09/27/2022    1:17 PM 05/27/2022    1:47 PM 05/14/2022    6:20 PM 12/07/2021   12:02 PM 01/13/2017   12:43 PM 09/23/2016    3:23 PM  Advanced Directives  Does Patient Have a Medical Advance Directive? Yes Yes Yes No Yes Yes Yes  Type of AParamedicof ABarnumLiving will  Living will;Healthcare Power of Attorney  Out of facility DNR (pink MOST or yellow form) [place] Does patient want to make changes to medical advance directive?   No - Patient declined   No - Patient declined   Copy of HFordsin Chart? Yes - validated most recent copy scanned in chart (See row information)     No - copy requested   Would patient like information on creating a medical advance directive?  No - Patient declined         Current Medications (verified) Outpatient Encounter Medications as of 12/13/2022  Medication Sig   albuterol (VENTOLIN HFA) 108 (90 Base) MCG/ACT inhaler Inhale 2 puffs into the lungs every 6 (six) hours as needed for wheezing or shortness of breath.   Ascorbic Acid (VITAMIN C PO) Take 1 tablet by mouth daily.   aspirin EC 81 MG tablet Take 1 tablet (81 mg total) by mouth daily. Swallow whole.   atorvastatin (LIPITOR) 40 MG tablet TAKE 1 TABLET BY MOUTH EVERY DAY   Calcium Carb-Cholecalciferol (CALCIUM-VITAMIN D3) 600-400 MG-UNIT TABS Take 1 tablet by mouth 2 (two) times daily.   docusate sodium (COLACE) 250  MG capsule Take 250 mg by mouth daily.   ferrous sulfate 325 (65 FE) MG EC tablet Take 1 tablet (325 mg total) by mouth 2 (two) times daily.   Glycopyrrolate-Formoterol (BEVESPI AEROSPHERE) 9-4.8 MCG/ACT AERO Inhale 2 puffs into the lungs in the morning and at bedtime.   inFLIXimab (REMICADE IV) Inject 100 mg into the vein as needed (for arthrithis).   isosorbide mononitrate (IMDUR) 30 MG 24 hr tablet TAKE 1/2 OF A TABLET (15 MG TOTAL) BY MOUTH DAILY   leflunomide (ARAVA) 20 MG tablet Take 20 mg by mouth daily.   metoprolol succinate (TOPROL-XL) 25 MG 24 hr tablet Take 1 tablet (25 mg total) by mouth daily. Please keep upcoming appt. With SRichardson DoppAug. 15th in order to receive future refills. Thank You.   MYRBETRIQ 50 MG TB24 tablet Take 50 mg by mouth daily.    nitroGLYCERIN (NITROSTAT) 0.4 MG SL tablet PLACE 1 TABLET UNDER THE TONGUE EVERY 5 MINUTES AS NEEDED FOR CHEST PAIN.   pantoprazole (PROTONIX) 40 MG tablet TAKE 1 TABLET BY MOUTH EVERY DAY   polyethylene glycol (MIRALAX) 17 g packet Take 17 g by mouth daily as needed.   acetaminophen (TYLENOL) 500 MG tablet Take 2 tablets (1,000 mg total) by mouth every 6 (six) hours as needed. (Patient not taking:  Reported on 12/13/2022)   Iron-FA-B Cmp-C-Biot-Probiotic (FUSION PLUS) CAPS Take 1 capsule by mouth every other day. Take with a full meal (Patient not taking: Reported on 09/27/2022)   No facility-administered encounter medications on file as of 12/13/2022.    Allergies (verified) Patient has no known allergies.   History: Past Medical History:  Diagnosis Date   3-vessel coronary artery disease 03/10/2020   Allergy    Anemia    Aortic valve calcification 03/10/2020   Arthritis    Asymptomatic microscopic hematuria 04/03/2016   Cystoscopy 02/207 Dr. Pilar [facility]vis [facility].     Atypical chest pain    a. 09/2016 MV: EF 63%, normal perfusion.   Benign prostatic hyperplasia 04/03/2016   Asymptomatic. Normal PSA and exam by Dr.  Pilar Jarvis [facility] 01/2016.     Bilateral carotid bruits 02/09/2019   Bladder wall thickening 03/10/2020   CAD (coronary artery disease)    Coronary artery calcification on Chest CT in 09/2016 and 01/2019 // Myoview 10/17: normal perfusion // Myoview 01/2019: no ischemia or infarction, EF 58 // Coronary CTA 03/2020: Calcium Score 1067; non-obstructive CAD [oLM 1-24, LAD ost 25049, mid and dist 1-24; D1 1-24; LCx prox and mid 1-24, OM1 and OM2 25-49, RCA and PLB 1-24; PLA 1-24]    Carotid bruit    Carotid Dopplers 01/2019: no ICA stenosis bilaterally    Colonic polyp 02/23/2016   COVID-19    DDD (degenerative disc disease), 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 L4-5 and C9-O7    Diastolic dysfunction    a. 2015 Echo: EF >55%;  b. 09/2016 Echo: EF 55-60%, no rwma, Gr1 DD, triv AI/MR, mild TR, PASP 27mHg.   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   On CT 01/2016    Palpitations    Echo 10/17: Mild focal basal septal hypertrophy, EF 55-60, no RWMA, Gr 1 DD, trivial MR, normal RVSF, mild TR, PASP 28 // Event monitor 01/2019: NSR, PACs   Prediabetes 10/29/2018   Pulmonary emphysema ([place] 12/18/2016   Renal cyst, left 04/03/2016   On CT, benign    Rheumatoid arthritis ([place]    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    COLONOSCOPY     COLOSTOMY REVERSAL     had infection on buttocks and had to have a skin graft- had colostomy to help area stay clean and heal   HERNIA REPAIR     POLYPECTOMY     SPINE SURGERY     Family History  Problem Relation Age of Onset   Hypertension Mother    Hypertension Father    Diabetes Father    Heart disease Father        stents   Hyperlipidemia Sister    Diabetes Sister    Stroke Brother    Diabetes Brother    Heart  disease Brother        stents   Colon polyps Neg Hx    Esophageal cancer Neg Hx    Stomach cancer Neg Hx    Rectal cancer Neg Hx    Colon cancer Neg Hx    Social History   Socioeconomic History   Marital status: Widowed    Spouse name: Not on file   Number of children: Not on file   Years of education: Not on file   Highest education level: Not on file  Occupational History   Not on file  Tobacco Use   Smoking status: Former    Packs/day: 1.00    Years: 17.00    Total pack years: 17.00    Types: Cigarettes    Quit date: 12/23/1993    Years since quitting: 28.9   Smokeless tobacco: Never  Vaping Use   Vaping Use: Never used  Substance and Sexual Activity   Alcohol use: No    Alcohol/week: 0.0 standard drinks of alcohol   Drug use: No   Sexual activity: Yes  Other Topics Concern   Not on file  Social History Narrative   Retired. Lives alone.    Social Determinants of Health   Financial Resource Strain: Low Risk  (12/13/2022)   Overall Financial Resource Strain (CARDIA)    Difficulty of Paying Living Expenses: Not hard at all  Food Insecurity: No Food Insecurity (12/13/2022)   Hunger Vital Sign    Worried About Running Out of Food in the Last Year: Never true    Ran Out of Food in the Last Year: Never true  Transportation Needs: No Transportation Needs (12/13/2022)   PRAPARE - Hydrologist (Medical): No    Lack of Transportation (Non-Medical): No  Physical Activity: Inactive (12/13/2022)   Exercise  Vital Sign    Days of Exercise per Week: 0 days    Minutes of Exercise per Session: 0 min  Stress: No Stress Concern Present (12/13/2022)   [place]    Feeling of Stress : Not at all  Social Connections: Not on file    Tobacco Counseling Counseling given: Not Answered   Clinical Intake:  Pre-visit preparation completed: Yes  Pain : No/denies pain     Nutritional Status: BMI of 19-24  Normal Nutritional Risks: None Diabetes: No  How often do you need to have someone help you when you read instructions, pamphlets, or other written materials from your doctor or pharmacy?: 1 - Never  Diabetic?no  Interpreter Needed?: No  Information entered by :: NAllen LPN   Activities of Daily Living    12/13/2022   11:52 AM  In your present state of health, do you have any difficulty performing the following activities:  Hearing? 0  Vision? 0  Difficulty concentrating or making decisions? 0  Walking or climbing stairs? 0  Dressing or bathing? 0  Doing errands, shopping? 0  Preparing Food and eating ? N  Using the Toilet? N  In the past six months, have you accidently leaked urine? N  Do you have problems with loss of bowel control? N  Managing your Medications? N  Managing your Finances? N  Housekeeping or managing your Housekeeping? N    Patient Care Team: Early, Coralee Pesa, NP as PCP - General (Nurse Practitioner) Freada Bergeron, MD as PCP - Cardiology (Cardiology) Sharmon Revere as Physician Assistant (Cardiology) Brand Males, MD as Consulting Physician (  Pulmonary Disease) Raynelle Bring, MD as Consulting Physician (Urology) Hennie Duos, MD as Consulting Physician (Rheumatology)  Indicate any recent Medical Services you may have received from other than [facility] providers in the past year (date may be approximate).     Assessment:   This is a routine wellness examination for  [facility].  Hearing/Vision screen Vision Screening - Comments:: Regular eye exams, [employer]  Dietary issues and exercise activities discussed: Current Exercise Habits: The patient does not participate in regular exercise at present   Goals Addressed             This Visit's Progress    Patient Stated       12/13/2022, no goals       Depression Screen    12/13/2022   11:51 AM 12/07/2021   12:02 PM 04/09/2021    2:03 PM 09/06/2020   11:01 AM 04/07/2020   10:44 AM 08/03/2018    1:46 PM 06/12/2018   12:51 PM  PHQ 2/9 Scores  PHQ - 2 Score 0 0 0 0 0 0 0  PHQ- 9 Score 0          Fall Risk    12/13/2022   11:51 AM 05/09/2022   11:13 AM 12/07/2021   12:02 PM 04/09/2021    2:03 PM 09/06/2020   11:00 AM  Fall Risk   Falls in the past year? 0 0 0 0 0  Number falls in past yr: 0 0  0 0  Injury with Fall? 0 0  0 0  Risk for fall due to : No Fall Risks;Impaired balance/gait;Impaired mobility;Medication side effect Impaired mobility;Impaired balance/gait Medication side effect No Fall Risks   Follow up Falls prevention discussed;Education provided;Falls evaluation completed Education provided;Falls evaluation completed Falls evaluation completed;Education provided;Falls prevention discussed Falls evaluation completed     FALL RISK PREVENTION PERTAINING TO THE HOME:  Any stairs in or around the home? No  If so, are there any without handrails? N/a Home free of loose throw rugs in walkways, pet beds, electrical cords, etc? Yes  Adequate lighting in your home to reduce risk of falls? Yes   ASSISTIVE DEVICES UTILIZED TO PREVENT FALLS:  Life alert? No  Use of a cane, walker or w/c? Yes  Grab bars in the bathroom? No  Shower chair or bench in shower? Yes  Elevated toilet seat or a handicapped toilet? No   TIMED UP AND GO:  Was the test performed? Yes .  Length of time to ambulate 10 feet: 7 sec.   Gait slow and steady with assistive device  Cognitive  Function:        12/13/2022   11:53 AM 12/07/2021   12:03 PM  6CIT Screen  What Year? 0 points 0 points  What month? 0 points 0 points  What time? 0 points 0 points  Count back from 20 0 points 0 points  Months in reverse 4 points 4 points  Repeat phrase 4 points 6 points  Total Score 8 points 10 points    Immunizations Immunization History  Administered Date(s) Administered   Influenza, High Dose Seasonal PF 07/23/2017, 08/05/2019, 08/30/2020, 08/22/2021, 08/10/2022   Influenza,inj,Quad PF,6+ Mos 08/01/2017   Influenza-Unspecified 10/30/2015, 09/13/2016, 07/30/2018, 08/05/2019   PFIZER Comirnaty(Gray Top)Covid-19 Tri-Sucrose Vaccine 05/12/2021, 09/23/2022   PFIZER(Purple Top)SARS-COV-2 Vaccination 01/28/2020, 02/18/2020, 09/19/2020   PNEUMOCOCCAL CONJUGATE-20 08/17/2021   Pfizer Covid-19 Vaccine Bivalent Booster 3yr & up 11/05/2021   Pneumococcal Conjugate-13 12/05/2015   Pneumococcal Polysaccharide-23 04/23/2017  Tdap 12/09/2015   Zoster Recombinat (Shingrix) 10/26/2021, 08/20/2022   Zoster, Live 12/09/2015    TDAP status: Up to date  Flu Vaccine status: Up to date  Pneumococcal vaccine status: Up to date  Covid-19 vaccine status: Completed vaccines  Qualifies for Shingles Vaccine? Yes   Zostavax completed Yes   Shingrix Completed?: Yes  Screening Tests Health Maintenance  Topic Date Due   COVID-19 Vaccine (7 - 2023-24 season) 11/18/2022   Medicare Annual Wellness (AWV)  12/14/2023   DTaP/Tdap/Td (2 - Td or Tdap) 12/08/2025   Pneumonia Vaccine 74+ Years old  Completed   INFLUENZA VACCINE  Completed   Zoster Vaccines- Shingrix  Completed   HPV VACCINES  Aged Out   COLONOSCOPY (Pts 45-48yr Insurance coverage will need to be confirmed)  Discontinued    Health Maintenance  Health Maintenance Due  Topic Date Due   COVID-19 Vaccine (7 - 2023-24 season) 11/18/2022    Colorectal cancer screening: No longer required.   Lung Cancer Screening: (Low Dose  CT Chest recommended if Age 80-80years, 30 pack-year currently smoking OR have quit w/in 15years.) does not qualify.   Lung Cancer Screening Referral: no  Additional Screening:  Hepatitis C Screening: does not qualify;   Vision Screening: Recommended annual ophthalmology exams for early detection of glaucoma and other disorders of the eye. Is the patient up to date with their annual eye exam?  Yes  Who is the provider or what is the name of the office in which the patient attends annual eye exams? [facility] pt is not established with a provider, would they like to be referred to a provider to establish care? No .   Dental Screening: Recommended annual dental exams for proper oral hygiene  Community Resource Referral / Chronic Care Management: CRR required this visit?  No   CCM required this visit?  No      Plan:     I have personally reviewed and noted the following in the patient's chart:   Medical and social history Use of alcohol, tobacco or illicit drugs  Current medications and supplements including opioid prescriptions. Patient is not currently taking opioid prescriptions. Functional ability and status Nutritional status Physical activity Advanced directives List of other physicians Hospitalizations, surgeries, and ER visits in previous 12 months Vitals Screenings to include cognitive, depression, and falls Referrals and appointments  In addition, I have reviewed and discussed with patient certain preventive protocols, quality metrics, and best practice recommendations. A written personalized care plan for preventive services as well as general preventive health recommendations were provided to patient.     NKellie Simmering LPN   [id number]  Nurse Notes: none

## 2022-12-17 ENCOUNTER — Other Ambulatory Visit: Payer: Self-pay | Admitting: Gastroenterology

## 2022-12-28 ENCOUNTER — Other Ambulatory Visit: Payer: Self-pay | Admitting: Physician Assistant

## 2023-01-06 ENCOUNTER — Telehealth: Payer: Self-pay | Admitting: Family Medicine

## 2023-01-06 ENCOUNTER — Encounter: Payer: Self-pay | Admitting: Nurse Practitioner

## 2023-01-06 ENCOUNTER — Ambulatory Visit (INDEPENDENT_AMBULATORY_CARE_PROVIDER_SITE_OTHER): Payer: Medicare HMO | Admitting: Nurse Practitioner

## 2023-01-06 ENCOUNTER — Other Ambulatory Visit: Payer: Self-pay

## 2023-01-06 VITALS — BP 124/82 | HR 68 | Temp 97.6°F | Wt 134.2 lb

## 2023-01-06 DIAGNOSIS — M79622 Pain in left upper arm: Secondary | ICD-10-CM

## 2023-01-06 MED ORDER — DICLOFENAC SODIUM 75 MG PO TBEC: 75.0000 mg | DELAYED_RELEASE_TABLET | Freq: Two times a day (BID) | ORAL | 0 refills | Status: DC

## 2023-01-06 MED ORDER — NITROGLYCERIN 0.4 MG SL SUBL: SUBLINGUAL_TABLET | SUBLINGUAL | 2 refills | Status: DC

## 2023-01-06 NOTE — Patient Instructions (Addendum)
I have sent in an order for an ultrasound at [facility] to see if we can see any injury, swelling, or damage to the area you are having pain.   I have also send in a medication called toradol to help with the pain and any swelling that may be going on. You can take this twice a day.   I will be in touch with you when we get the ultrasound results back and will let you know what our next steps are.  I suspect this is inflammation in the tendon in the arm and this should get better with rest, ice, heat, and the medication.

## 2023-01-06 NOTE — Telephone Encounter (Signed)
Pt left message that his temp on Fri was 89.9.  I called him today he said back up to 96.  He said now his arm and shoulder are hurting.  He is coming in to be seen.  Pt was using forehead thermometer

## 2023-01-06 NOTE — Progress Notes (Signed)
Orma Render, DNP, AGNP-c [address], [place] [address] [phone]  Subjective:   Tony Mcmillan is a 82 y.o. male presents to day for evaluation of: Left Arm Pain Tony Mcmillan reports a constant aching sensation in the left upper arm and radiation down in to the hand. He tells me the area is painful when he lays on it. He also noticed a knot in the area of pain. This has been ongoing for about the last week. He has not had any known injury and has not been using his arm more than usual. He has had surgery on his neck in the distant past and has hardware present, but this has not been bothering him. He does endorse tingling and burning sensation in the arm.   PMH, Medications, and Allergies reviewed and updated in chart as appropriate.   ROS negative except for what is listed in HPI. Objective:  BP 124/82   Pulse 68   Temp 97.6 F (36.4 C)   Wt 134 lb 3.2 oz (60.9 kg)   BMI 23.77 kg/m  Physical Exam Vitals and nursing note reviewed.  Constitutional:      Appearance: Normal appearance.  HENT:     Head: Normocephalic.  Eyes:     Pupils: Pupils are equal, round, and reactive to light.  Neck:     Vascular: No carotid bruit.  Cardiovascular:     Rate and Rhythm: Normal rate and regular rhythm.     Pulses: Normal pulses.     Heart sounds: Normal heart sounds.  Pulmonary:     Effort: Pulmonary effort is normal.     Breath sounds: Normal breath sounds.  Musculoskeletal:        General: Swelling and tenderness present.     Cervical back: Normal range of motion. No rigidity or tenderness.     Comments: Tenderness with palpation to the left upper extremity along the deltoid muscle. Mild inflammation present to the area of tenderness.   Skin:    General: Skin is warm and dry.     Capillary Refill: Capillary refill takes less than 2 seconds.  Neurological:     General: No focal deficit present.     Mental Status: He is alert and oriented to  person, place, and time.  Psychiatric:        Mood and Affect: Mood normal.           Assessment & Plan:   Problem List Items Addressed This Visit     Left upper arm pain - Primary    Tenderness and nodule noted to the area of the deltoid of the left arm with some weakness noted. He also endorses paresthesias to the arm. At this time it is not clear if this could be related to his previous neck surgery or if this is a separate concern. Given the specific location that appears to be muscular in nature, we will start with ultrasound examination of the area to determine if there are any abnormalities noted. Will make changes to the plan of care as necessary based on findings. Recommend ice to the area and avoiding sleeping on that is, if possible.       Relevant Medications   diclofenac (VOLTAREN) 75 MG EC tablet   Other Relevant Orders   US SOFT TISSUE UPPER EXTREMITY LIMITED LEFT (NON-VASCULAR)      Orma Render, DNP, AGNP-c 01/12/2023  12:25 AM    History, Medications, Surgery, SDOH, and Family History  reviewed and updated as appropriate.

## 2023-01-12 DIAGNOSIS — M79622 Pain in left upper arm: Secondary | ICD-10-CM | POA: Insufficient documentation

## 2023-01-12 HISTORY — DX: Pain in left upper arm: M79.622

## 2023-01-12 NOTE — Assessment & Plan Note (Signed)
Tenderness and nodule noted to the area of the deltoid of the left arm with some weakness noted. He also endorses paresthesias to the arm. At this time it is not clear if this could be related to his previous neck surgery or if this is a separate concern. Given the specific location that appears to be muscular in nature, we will start with ultrasound examination of the area to determine if there are any abnormalities noted. Will make changes to the plan of care as necessary based on findings. Recommend ice to the area and avoiding sleeping on that is, if possible.

## 2023-01-15 ENCOUNTER — Ambulatory Visit (HOSPITAL_COMMUNITY)
Admission: RE | Admit: 2023-01-15 | Discharge: 2023-01-15 | Disposition: A | Discharge status: Home / Self Care | Payer: Medicare HMO | Source: Ambulatory Visit | Attending: Nurse Practitioner | Admitting: Nurse Practitioner

## 2023-01-15 DIAGNOSIS — M79622 Pain in left upper arm: Secondary | ICD-10-CM | POA: Diagnosis not present

## 2023-01-22 ENCOUNTER — Telehealth: Payer: Self-pay | Admitting: Family Medicine

## 2023-01-22 NOTE — Telephone Encounter (Signed)
Patient called a couple of times wanting results.  I see that you haven't saw them yet.  I read the results to him. I told him I would ask you what next steps are.  He said he read that the Voltaren can kill you so he only took it for 1 day.    Please advise for patient.

## 2023-01-27 ENCOUNTER — Encounter: Payer: Self-pay | Admitting: Nurse Practitioner

## 2023-01-27 ENCOUNTER — Ambulatory Visit (INDEPENDENT_AMBULATORY_CARE_PROVIDER_SITE_OTHER): Payer: Medicare HMO

## 2023-01-27 ENCOUNTER — Encounter: Payer: Self-pay | Admitting: Orthopaedic Surgery

## 2023-01-27 ENCOUNTER — Other Ambulatory Visit: Payer: Self-pay | Admitting: Nurse Practitioner

## 2023-01-27 ENCOUNTER — Ambulatory Visit (INDEPENDENT_AMBULATORY_CARE_PROVIDER_SITE_OTHER): Payer: Medicare HMO | Admitting: Orthopaedic Surgery

## 2023-01-27 VITALS — BP 124/75 | HR 76 | Ht 63.0 in | Wt 134.0 lb

## 2023-01-27 DIAGNOSIS — M542 Cervicalgia: Secondary | ICD-10-CM | POA: Diagnosis not present

## 2023-01-27 DIAGNOSIS — M25512 Pain in left shoulder: Secondary | ICD-10-CM

## 2023-01-27 DIAGNOSIS — M79622 Pain in left upper arm: Secondary | ICD-10-CM

## 2023-01-27 NOTE — Progress Notes (Unsigned)
Office Visit Note   Patient: Tony Mcmillan           Date of Birth: 1941/01/09           MRN: [id number] Visit Date: 01/27/2023              Requested by: Orma Render, NP [address],  [place] [address] PCP: Orma Render, NP   Assessment & Plan: Visit Diagnoses:  1. Neck pain   2. Acute pain of left shoulder     Plan: Left glenohumeral injection performed which she tolerated.  He can follow-up if he has ongoing symptoms.  Follow-Up Instructions: No follow-ups on file.   Orders:  Orders Placed This Encounter  Procedures   Large Joint Inj: L glenohumeral   XR Cervical Spine 2 or 3 views   XR Shoulder Left   Meds ordered this encounter  Medications   bupivacaine (MARCAINE) 0.25 % (with pres) injection 4 mL   lidocaine (XYLOCAINE) 1 % (with pres) injection 0.5 mL   methylPREDNISolone acetate (DEPO-MEDROL) injection 40 mg      Procedures: Large Joint Inj: L glenohumeral on 01/28/2023 12:47 PM Indications: pain Details: 22 G 1.5 in needle, posterior approach  Arthrogram: No  Medications: 4 mL bupivacaine 0.25 %; 40 mg methylPREDNISolone acetate 40 MG/ML; 0.5 mL lidocaine 1 % Outcome: tolerated well, no immediate complications Procedure, treatment alternatives, risks and benefits explained, specific risks discussed. Consent was given by the patient. Immediately prior to procedure a time out was called to verify the correct patient, procedure, equipment, support staff and site/side marked as required. Patient was prepped and draped in the usual sterile fashion.       Clinical Data: No additional findings.   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  Patient has with him a copy of his operative note he was fused anteriorly had expandable cage vertebral body replacement with instrumented  anterior plate spanning the expandable cage and then posterior instrumented fusion.  He states he was in the hospital for multiple weeks.  Recently had increased pain in his shoulder with range of motion some numbness and tingling in his fingers which has been persistent and states he has pain when he lays on his left side.  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  Patient states he has problems getting his left arm up over his head with stiffness and discomfort.  Additionally problems with PACs, pulmonary fibrosis.  COPD aortic valve calcification, Remicade for his arthritis.  Review of Systems all systems are noncontributory to HPI.   Objective: Vital Signs: BP 124/75   Pulse 76   Ht '5\' 3"'$  (1.6 m)   Wt 134 lb (60.8 kg)   BMI 23.74 kg/m   Physical Exam Constitutional:      Appearance: He is well-developed.  HENT:     Head: Normocephalic and atraumatic.     Right Ear: External ear normal.     Left Ear: External ear normal.  Eyes:     Pupils: Pupils are equal, round, and reactive to light.  Neck:     Thyroid: No thyromegaly.     Trachea: No tracheal deviation.  Cardiovascular:     Rate and Rhythm: Normal rate.  Pulmonary:  Effort: Pulmonary effort is normal.     Breath sounds: No wheezing.  Abdominal:     General: Bowel sounds are normal.     Palpations: Abdomen is soft.  Musculoskeletal:     Cervical back: Neck supple.  Skin:    General: Skin is warm and dry.     Capillary Refill: Capillary refill takes less than 2 seconds.  Neurological:     Mental Status: He is alert and oriented to person, place, and time.  Psychiatric:        Behavior: Behavior normal.        Thought Content: Thought content normal.        Judgment: Judgment normal.     Ortho Exam 50% cervical rotation.  Some brachial plexus tenderness left greater than right decreased abduction shoulder and flexion 60% normal  range of motion.  Patient has deltoid and periscapular atrophy of the left shoulder. Patient can ambulate with short stride gait decreased balance mild lower extremity hyperreflexia. Specialty Comments:  No specialty comments available.  Imaging: XR Shoulder Left  Result Date: 01/28/2023 Three-view x-rays left shoulder demonstrate arthritis of the left shoulder with lateral joint space large inferior osteophytes.  No definite marginal erosion.  Difficult to determine OA versus RA changes. Impression: Severe left shoulder arthritis.  XR Cervical Spine 2 or 3 views  Result Date: 01/28/2023 AP lateral cervical spine images are obtained and reviewed this shows anterior vertebral resection with expandable cage and screws and C3 and C6 spanning expandable cage.  There is posterior instrumented screws and rods from C3-C6.  No comparison previous images are available. Impression: Anterior posterior fusion procedure C3-C6 with anterior expandable cage.  Mild residual kyphosis.    PMFS History: Patient Active Problem List   Diagnosis Date Noted   Left upper arm pain 01/12/2023   COPD (chronic obstructive pulmonary disease) ([place]) 08/08/2022   Olecranon bursitis of right elbow 05/24/2022   Other long term (current) drug therapy 05/09/2022   Pulmonary fibrosis ([place]) 05/09/2022   Absolute anemia 05/09/2022   Mixed hyperlipidemia 01/21/2022   Premature atrial contractions 01/20/2022   CAD (coronary artery disease)    Enlarged prostate 03/10/2020   Aortic valve calcification 03/10/2020   Bladder wall thickening 03/10/2020   Weight loss 11/15/2019   Bilateral carotid bruits 02/09/2019   History of systemic steroid therapy 10/30/2017   Pulmonary emphysema ([place]) 12/18/2016   Rheumatoid arthritis ([place]) 11/04/2016   High risk medication use 11/04/2016   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   Vitamin D deficiency 11/04/2016   Chronic wrist pain 07/02/2016   History of colonic polyps  07/02/2016   History of gastric ulcer 07/02/2016   Asymptomatic microscopic hematuria 04/03/2016   Benign prostatic hyperplasia 04/03/2016   Renal cyst, left 04/03/2016   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 Pilar [facility]vis [facility].     Atypical chest pain    a. 09/2016 MV: EF 63%, normal perfusion.   Benign prostatic hyperplasia 04/03/2016   Asymptomatic. Normal PSA and exam by Dr. Pilar Jarvis [facility] 01/2016.     Bilateral carotid bruits 02/09/2019   Bladder wall thickening 03/10/2020   CAD (coronary artery disease)  Coronary artery calcification on Chest CT in 09/2016 and 01/2019 // Myoview 10/17: normal perfusion // Myoview 01/2019: no ischemia or infarction, EF 58 // Coronary CTA 03/2020: Calcium Score 1067; non-obstructive CAD [oLM 1-24, LAD ost 25049, mid and dist 1-24; D1 1-24; LCx prox and mid 1-24, OM1 and OM2 25-49, RCA and PLB 1-24; PLA 1-24]    Carotid bruit    Carotid Dopplers 01/2019: no ICA stenosis bilaterally    Colonic polyp 02/23/2016   COVID-19    DDD (degenerative disc disease), 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 L4-5 and X9-J4    Diastolic dysfunction    a. 2015 Echo: EF >55%;  b. 09/2016 Echo: EF 55-60%, no rwma, Gr1 DD, triv AI/MR, mild TR, PASP 18mHg.   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   On CT 01/2016     Palpitations    Echo 10/17: Mild focal basal septal hypertrophy, EF 55-60, no RWMA, Gr 1 DD, trivial MR, normal RVSF, mild TR, PASP 28 // Event monitor 01/2019: NSR, PACs   Prediabetes 10/29/2018   Pulmonary emphysema ([facility] 12/18/2016   Renal cyst, left 04/03/2016   On CT, benign    Rheumatoid arthritis ([place]    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[place] 11/04/2016   -RF,-CCP,-[id number], erosive disease with contractures   Tubular adenoma of colon 01/2016   Vitamin D deficiency 11/04/2016    Family History  Problem Relation Age of Onset   Hypertension Mother    Hypertension Father    Diabetes Father    Heart disease Father        stents   Hyperlipidemia Sister    Diabetes Sister    Stroke Brother    Diabetes Brother    Heart disease Brother        stents   Colon polyps Neg Hx    Esophageal cancer Neg Hx    Stomach cancer Neg Hx    Rectal cancer Neg Hx    Colon cancer Neg Hx     Past Surgical History:  Procedure Laterality Date   ANTERIOR CERVICAL CORPECTOMY     ANTERIOR CERVICAL CORPECTOMY  12/2014   [place]   COLONOSCOPY     COLOSTOMY REVERSAL     had infection on buttocks and had to have a skin graft- had colostomy to help area stay clean and heal   HERNIA REPAIR     POLYPECTOMY     SPINE SURGERY     Social History   Occupational History   Not on file  Tobacco Use   Smoking status: Former    Packs/day: 1.00    Years: 17.00    Total pack years: 17.00    Types: Cigarettes    Quit date: 12/23/1993    Years since quitting: 29.1   Smokeless tobacco: Never  Vaping Use   Vaping Use: Never used  Substance and Sexual Activity   Alcohol use: No    Alcohol/week: 0.0 standard drinks of alcohol   Drug use: No   Sexual activity: Yes

## 2023-01-28 ENCOUNTER — Inpatient Hospital Stay: Payer: Medicare HMO | Admitting: Oncology

## 2023-01-28 ENCOUNTER — Inpatient Hospital Stay: Payer: Medicare HMO | Attending: Oncology

## 2023-01-28 VITALS — BP 140/59 | HR 73 | Temp 98.2°F | Resp 20 | Ht 63.0 in | Wt 133.0 lb

## 2023-01-28 DIAGNOSIS — M069 Rheumatoid arthritis, unspecified: Secondary | ICD-10-CM | POA: Insufficient documentation

## 2023-01-28 DIAGNOSIS — M25512 Pain in left shoulder: Secondary | ICD-10-CM

## 2023-01-28 DIAGNOSIS — Z862 Personal history of diseases of the blood and blood-forming organs and certain disorders involving the immune mechanism: Secondary | ICD-10-CM

## 2023-01-28 DIAGNOSIS — J449 Chronic obstructive pulmonary disease, unspecified: Secondary | ICD-10-CM | POA: Diagnosis not present

## 2023-01-28 DIAGNOSIS — K5900 Constipation, unspecified: Secondary | ICD-10-CM | POA: Diagnosis not present

## 2023-01-28 DIAGNOSIS — D508 Other iron deficiency anemias: Secondary | ICD-10-CM

## 2023-01-28 DIAGNOSIS — D509 Iron deficiency anemia, unspecified: Secondary | ICD-10-CM | POA: Insufficient documentation

## 2023-01-28 DIAGNOSIS — I2510 Atherosclerotic heart disease of native coronary artery without angina pectoris: Secondary | ICD-10-CM | POA: Diagnosis not present

## 2023-01-28 LAB — CBC WITH DIFFERENTIAL (CANCER CENTER ONLY)
Abs Immature Granulocytes: 0.02 10*3/uL (ref 0.00–0.07)
Basophils Absolute: 0 10*3/uL (ref 0.0–0.1)
Basophils Relative: 0 %
Eosinophils Absolute: 0 10*3/uL (ref 0.0–0.5)
Eosinophils Relative: 0 %
HCT: 37.1 % — ABNORMAL LOW (ref 39.0–52.0)
Hemoglobin: 12.1 g/dL — ABNORMAL LOW (ref 13.0–17.0)
Immature Granulocytes: 0 %
Lymphocytes Relative: 8 %
Lymphs Abs: 0.4 10*3/uL — ABNORMAL LOW (ref 0.7–4.0)
MCH: 27.8 pg (ref 26.0–34.0)
MCHC: 32.6 g/dL (ref 30.0–36.0)
MCV: 85.1 fL (ref 80.0–100.0)
Monocytes Absolute: 0.5 10*3/uL (ref 0.1–1.0)
Monocytes Relative: 9 %
Neutro Abs: 4.5 10*3/uL (ref 1.7–7.7)
Neutrophils Relative %: 83 %
Platelet Count: 314 10*3/uL (ref 150–400)
RBC: 4.36 MIL/uL (ref 4.22–5.81)
RDW: 14.4 % (ref 11.5–15.5)
WBC Count: 5.4 10*3/uL (ref 4.0–10.5)
nRBC: 0 % (ref 0.0–0.2)

## 2023-01-28 LAB — FERRITIN: Ferritin: 19 ng/mL — ABNORMAL LOW (ref 24–336)

## 2023-01-28 MED ORDER — BUPIVACAINE HCL 0.25 % IJ SOLN
4.0000 mL | INTRAMUSCULAR | Status: AC | PRN
  Administered 2023-01-28: 4 mL via INTRA_ARTICULAR

## 2023-01-28 MED ORDER — LIDOCAINE HCL 1 % IJ SOLN
0.5000 mL | INTRAMUSCULAR | Status: AC | PRN
  Administered 2023-01-28: .5 mL

## 2023-01-28 MED ORDER — METHYLPREDNISOLONE ACETATE 40 MG/ML IJ SUSP
40.0000 mg | INTRAMUSCULAR | Status: AC | PRN
  Administered 2023-01-28: 40 mg via INTRA_ARTICULAR

## 2023-01-28 NOTE — Progress Notes (Signed)
  [facility] OFFICE PROGRESS NOTE   Diagnosis: Iron deficiency  INTERVAL HISTORY:   Tony Mcmillan returns as scheduled.  He continues iron therapy.  He has noted dark stools since starting iron.  No bleeding.  He has intermittent constipation, relieved with MiraLAX.  He had an injection in the left shoulder yesterday.  Reports increased rheumatoid arthritis symptoms in the left shoulder.  Objective:  Vital signs in last 24 hours:  Blood pressure (!) 140/59, pulse 73, temperature 98.2 F (36.8 C), temperature source Oral, resp. rate 20, height '5\' 3"'$  (1.6 m), weight 133 lb (60.3 kg), SpO2 100 %.     Resp: Decreased breath sounds at the right lower posterior chest, no respiratory distress Cardio: Regular rate and rhythm GI: No hepatosplenomegaly Vascular: No leg edema    Lab Results:  Lab Results  Component Value Date   WBC 5.4 01/28/2023   HGB 12.1 (L) 01/28/2023   HCT 37.1 (L) 01/28/2023   MCV 85.1 01/28/2023   PLT 314 01/28/2023   NEUTROABS 4.5 01/28/2023    CMP  Lab Results  Component Value Date   NA 138 07/03/2022   K 4.6 07/03/2022   CL 103 07/03/2022   CO2 24 07/03/2022   GLUCOSE 98 07/03/2022   BUN 13 07/03/2022   CREATININE 1.12 07/03/2022   CALCIUM 9.4 07/03/2022   PROT 6.9 07/03/2022   ALBUMIN 4.2 07/03/2022   AST 20 07/03/2022   ALT 20 07/03/2022   ALKPHOS 61 07/03/2022   BILITOT 0.2 07/03/2022   GFRNONAA >60 01/06/2022   GFRAA 54 (L) 12/27/2020   Medications: I have reviewed the patient's current medications.   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  We will follow-up on the ferritin level from today.  He has mild normocytic anemia today, likely related to rheumatoid arthritis.  He will continue iron.  He will return for an  office and lab visit in 6 months.  Betsy Coder, MD  01/28/2023  12:15 PM

## 2023-01-29 ENCOUNTER — Other Ambulatory Visit: Payer: Self-pay

## 2023-01-29 ENCOUNTER — Telehealth: Payer: Self-pay

## 2023-01-29 DIAGNOSIS — D508 Other iron deficiency anemias: Secondary | ICD-10-CM

## 2023-01-29 NOTE — Telephone Encounter (Signed)
-----   Message from Ladell Pier, MD sent at 01/28/2023  8:14 PM EST ----- Please call patient, iron level is mildly low, continue iron, repeat cbc and ferritin 3 months

## 2023-01-29 NOTE — Telephone Encounter (Signed)
Patient gave verbal understanding and had no further questions or concerns at this time 

## 2023-02-12 ENCOUNTER — Other Ambulatory Visit: Payer: Self-pay | Admitting: Physician Assistant

## 2023-02-14 ENCOUNTER — Ambulatory Visit: Payer: Medicare HMO | Admitting: Adult Health

## 2023-02-17 ENCOUNTER — Encounter: Payer: Self-pay | Admitting: Adult Health

## 2023-02-17 ENCOUNTER — Ambulatory Visit (INDEPENDENT_AMBULATORY_CARE_PROVIDER_SITE_OTHER): Payer: Medicare HMO

## 2023-02-17 ENCOUNTER — Other Ambulatory Visit: Payer: Self-pay | Admitting: *Deleted

## 2023-02-17 ENCOUNTER — Ambulatory Visit: Payer: Medicare HMO | Admitting: Adult Health

## 2023-02-17 VITALS — BP 116/50 | HR 86 | Temp 97.6°F | Ht 62.0 in | Wt 136.0 lb

## 2023-02-17 DIAGNOSIS — J449 Chronic obstructive pulmonary disease, unspecified: Secondary | ICD-10-CM | POA: Diagnosis not present

## 2023-02-17 DIAGNOSIS — R9389 Abnormal findings on diagnostic imaging of other specified body structures: Secondary | ICD-10-CM

## 2023-02-17 NOTE — Progress Notes (Signed)
$'@Patient'K$  ID: Tony Mcmillan, male    DOB: Apr 30, 1941, 82 y.o.   MRN: [id number]  Chief Complaint  Patient presents with   Follow-up    Referring provider: Marcellina Millin  HPI: 82 yo male former smoker followed for COPD with Emphysema  Previously on nocturnal oxygen  Medical history significant for RA on Arava, recurrent iron deficiency anemia  TEST/EVENTS :  HRCT chest January 2022 negative for ILD, elevation of right hemidiaphragm with associated scarring and atelectasis in the right base, emphysema   PFT 10/2020 Mild to moderate restriction with decreased diffucing capacity , FEV1 80%, ratio 81, FVC 71%, diffusing capacity 60%  2D echo October 2017 EF 55 to 123456, grade 1 diastolic dysfunction, normal pulmonary artery systolic pressure  Stress Myoview March 2022 EF at 72%, low risk study.  02/17/2023 Follow up: COPD with Emphysema  Patient presents for 16-monthfollow-up.  Patient is followed for COPD with emphysema.  Remains on Bevespi twice daily.  Patient says overall breathing is doing okay.  Denies any flare of cough or wheezing.  Activity level is at baseline.  Does get short of breath with prolonged walking.  Has intermittent cough that is minimally productive. Cough comes and goes.   Patient lives alone at home.  He was able to drive.  And does all of his household chores. Appetite is okay . No n/v.d Walks with cane.  Prevnar 20, COVID-19 boosters and influenza vaccines are up-to-date.   No Known Allergies  Immunization History  Administered Date(s) Administered   Influenza, High Dose Seasonal PF 07/23/2017, 08/05/2019, 08/30/2020, 08/22/2021, 08/10/2022   Influenza,inj,Quad PF,6+ Mos 08/01/2017   Influenza-Unspecified 10/30/2015, 09/13/2016, 07/30/2018, 08/05/2019   PFIZER Comirnaty(Gray Top)Covid-19 Tri-Sucrose Vaccine 05/12/2021, 09/23/2022   PFIZER(Purple Top)SARS-COV-2 Vaccination 01/28/2020, 02/18/2020, 09/19/2020   PNEUMOCOCCAL CONJUGATE-20 08/17/2021    Pfizer Covid-19 Vaccine Bivalent Booster 172yr& up 11/05/2021   Pneumococcal Conjugate-13 12/05/2015   Pneumococcal Polysaccharide-23 04/23/2017   Tdap 12/09/2015   Zoster Recombinat (Shingrix) 10/26/2021, 08/20/2022   Zoster, Live 12/09/2015    Past Medical History:  Diagnosis Date   3-vessel coronary artery disease 03/10/2020   Allergy    Anemia    Aortic valve calcification 03/10/2020   Arthritis    Asymptomatic microscopic hematuria 04/03/2016   Cystoscopy 02/[facility].     Atypical chest pain    a. 09/2016 MV: EF 63%, normal perfusion.   Benign prostatic hyperplasia 04/03/2016   Asymptomatic. Normal PSA and exam by Dr. [facility] 01/2016.     Bilateral carotid bruits 02/09/2019   Bladder wall thickening 03/10/2020   CAD (coronary artery disease)    Coronary artery calcification on Chest CT in 09/2016 and 01/2019 // Myoview 10/17: normal perfusion // Myoview 01/2019: no ischemia or infarction, EF 58 // Coronary CTA 03/2020: Calcium Score 1067; non-obstructive CAD [oLM 1-24, LAD ost 25049, mid and dist 1-24; D1 1-24; LCx prox and mid 1-24, OM1 and OM2 25-49, RCA and PLB 1-24; PLA 1-24]    Carotid bruit    Carotid Dopplers 01/2019: no ICA stenosis bilaterally    Colonic polyp 02/23/2016   COVID-19    DDD (degenerative disc disease), 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 L4-5 and [id number]  Diastolic dysfunction    a. 2015 Echo: EF >55%;  b. 09/2016 Echo: EF 55-60%, no rwma, Gr1 DD, triv AI/MR, mild TR, PASP 2838m.   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   Duodenal ulcer 02/23/2016   Emphysema  of lung (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   On CT 01/2016    Palpitations    Echo 10/17: Mild focal basal  septal hypertrophy, EF 55-60, no RWMA, Gr 1 DD, trivial MR, normal RVSF, mild TR, PASP 28 // Event monitor 01/2019: NSR, PACs   Prediabetes 10/29/2018   Pulmonary emphysema ([place]) 12/18/2016   Renal cyst, left 04/03/2016   On CT, benign    Rheumatoid arthritis ([place])    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[place]) 11/04/2016   -RF,-CCP,-1433eta, erosive disease with contractures   Tubular adenoma of colon 01/2016   Vitamin D deficiency 11/04/2016    Tobacco History: Social History   Tobacco Use  Smoking Status Former   Packs/day: 1.00   Years: 17.00   Total pack years: 17.00   Types: Cigarettes   Quit date: 12/23/1993   Years since quitting: 29.1  Smokeless Tobacco Never   Counseling given: Not Answered   Outpatient Medications Prior to Visit  Medication Sig Dispense Refill   albuterol (VENTOLIN HFA) 108 (90 Base) MCG/ACT inhaler Inhale 2 puffs into the lungs every 6 (six) hours as needed for wheezing or shortness of breath. 8 g 6   aspirin EC 81 MG tablet Take 1 tablet (81 mg total) by mouth daily. Swallow whole. 30 tablet 12   atorvastatin (LIPITOR) 40 MG tablet TAKE 1 TABLET BY MOUTH EVERY DAY 90 tablet 3   Calcium Carb-Cholecalciferol (CALCIUM-VITAMIN D3) 600-400 MG-UNIT TABS Take 1 tablet by mouth 2 (two) times daily.     docusate sodium (COLACE) 250 MG capsule Take 250 mg by mouth daily.     ferrous sulfate 325 (65 FE) MG EC tablet Take 1 tablet (325 mg total) by mouth 2 (two) times daily.     Glycopyrrolate-Formoterol (BEVESPI AEROSPHERE) 9-4.8 MCG/ACT AERO Inhale 2 puffs into the lungs in the morning and at bedtime. 32.1 g 3   inFLIXimab (REMICADE IV) Inject 100 mg into the vein as needed (for arthrithis).     isosorbide mononitrate (IMDUR) 30 MG 24 hr tablet TAKE 1/2 OF A TABLET (15 MG TOTAL) BY MOUTH DAILY 45 tablet 1   latanoprost (XALATAN) 0.005 % ophthalmic solution SMARTSIG:1 Drop(s) In Eye(s) Every Evening     leflunomide (ARAVA) 20 MG tablet Take 20 mg by  mouth daily.     metoprolol succinate (TOPROL-XL) 25 MG 24 hr tablet Take 1 tablet (25 mg total) by mouth daily. 90 tablet 2   MYRBETRIQ 50 MG TB24 tablet Take 50 mg by mouth daily.      nitroGLYCERIN (NITROSTAT) 0.4 MG SL tablet PLACE 1 TABLET UNDER THE TONGUE EVERY 5 MINUTES AS NEEDED FOR CHEST PAIN. 75 tablet 2   pantoprazole (PROTONIX) 40 MG tablet TAKE 1 TABLET BY MOUTH EVERY DAY 90 tablet 0   polyethylene glycol (MIRALAX) 17 g packet Take 17 g by mouth daily as needed. 14 each 0   Ascorbic Acid (VITAMIN C PO) Take 1 tablet by mouth daily. (Patient not taking: Reported on 02/17/2023)     No facility-administered medications prior to visit.     Review of Systems:   Constitutional:   No  weight loss, night sweats,  Fevers, chills,  +fatigue, or  lassitude.  HEENT:   No headaches,  Difficulty swallowing,  Tooth/dental problems, or  Sore throat,                No sneezing, itching, ear ache, nasal congestion, post nasal drip,   CV:  No chest pain,  Orthopnea, PND, swelling in lower extremities, anasarca, dizziness, palpitations, syncope.   GI  No heartburn, indigestion, abdominal pain, nausea, vomiting, diarrhea, change in bowel habits, loss of appetite, bloody stools.   Resp:  No chest wall deformity  Skin: no rash or lesions.  GU: no dysuria, change in color of urine, no urgency or frequency.  No flank pain, no hematuria   MS:  No joint pain or swelling.  No decreased range of motion.  No back pain.    Physical Exam  BP (!) 116/50 (BP Location: Left Arm, Patient Position: Sitting, Cuff Size: Normal)   Pulse 86   Temp 97.6 F (36.4 C) (Oral)   Ht '5\' 2"'$  (1.575 m)   Wt 136 lb (61.7 kg)   SpO2 96%   BMI 24.87 kg/m   GEN: A/Ox3; pleasant , NAD, well nourished , cane    HEENT:  [place]/AT,  NOSE-clear, THROAT-clear, no lesions, no postnasal drip or exudate noted.   NECK:  Supple w/ fair ROM; no JVD; normal carotid impulses w/o bruits; no thyromegaly or nodules palpated; no  lymphadenopathy.    RESP  Clear  P & A; w/o, wheezes/ rales/ or rhonchi. no accessory muscle use, no dullness to percussion  CARD:  RRR, no m/r/g, no peripheral edema, pulses intact, no cyanosis or clubbing.  GI:   Soft & nt; nml bowel sounds; no organomegaly or masses detected.   Musco: Warm bil, no deformities or joint swelling noted.   Neuro: alert, no focal deficits noted.    Skin: Warm, no lesions or rashes    Lab Results:    BMET   BNP No results found for: "BNP"  ProBNP       Latest Ref Rng & Units 11/20/2020   12:36 PM 11/26/2016    9:25 AM  PFT Results  FVC-Pre L 1.91  1.79  P  FVC-Predicted Pre % 71  65  P  FVC-Post L  1.81  P  FVC-Predicted Post %  65  P  Pre FEV1/FVC % % 81  81  P  Post FEV1/FCV % %  82  P  FEV1-Pre L 1.55  1.46  P  FEV1-Predicted Pre % 80  73  P  FEV1-Post L  1.48  P  DLCO uncorrected ml/min/mmHg 11.83  12.16  P  DLCO UNC% % 60  53  P  DLCO corrected ml/min/mmHg 12.43  12.31  P  DLCO COR %Predicted % 63  53  P  DLVA Predicted % 99  98  P  TLC L  3.64  P  TLC % Predicted %  64  P  RV % Predicted %  86  P    P Preliminary result    No results found for: "NITRICOXIDE"      Assessment & Plan:   COPD (chronic obstructive pulmonary disease) (HCC) COPD with emphysema.  Appears stable.  Continue on Bevespi twice daily.  Mucinex as needed.  Check chest x-ray today.  Plan  Patient Instructions  Chest xray today  Mucinex DM Twice daily  As needed  cough/congestion  Continue on BEVESPI 2 puffs Twice daily Albuterol inhaler As needed   RSV vaccine when able.  Follow up with Dr. Chase Caller in 6 months and As needed  Please contact office for sooner follow up if symptoms do not improve or worsen or seek emergency care       Rexene Edison, NP 02/17/2023

## 2023-02-17 NOTE — Assessment & Plan Note (Signed)
COPD with emphysema.  Appears stable.  Continue on Bevespi twice daily.  Mucinex as needed.  Check chest x-ray today.  Plan  Patient Instructions  Chest xray today  Mucinex DM Twice daily  As needed  cough/congestion  Continue on BEVESPI 2 puffs Twice daily Albuterol inhaler As needed   RSV vaccine when able.  Follow up with Dr. Chase Caller in 6 months and As needed   Please contact office for sooner follow up if symptoms do not improve or worsen or seek emergency care

## 2023-02-17 NOTE — Progress Notes (Signed)
ATC x1.  LVM to return call.  Orders pended until we hear back from patient.

## 2023-02-17 NOTE — Patient Instructions (Addendum)
Chest xray today  Mucinex DM Twice daily  As needed  cough/congestion  Continue on BEVESPI 2 puffs Twice daily Albuterol inhaler As needed   RSV vaccine when able.  Follow up with Dr. Chase Caller in 6 months and As needed   Please contact office for sooner follow up if symptoms do not improve or worsen or seek emergency care

## 2023-02-18 ENCOUNTER — Ambulatory Visit (INDEPENDENT_AMBULATORY_CARE_PROVIDER_SITE_OTHER): Payer: Medicare HMO

## 2023-02-18 ENCOUNTER — Encounter: Payer: Self-pay | Admitting: Orthopaedic Surgery

## 2023-02-18 ENCOUNTER — Ambulatory Visit: Payer: Medicare HMO | Admitting: Nurse Practitioner

## 2023-02-18 ENCOUNTER — Ambulatory Visit: Payer: Medicare HMO | Admitting: Orthopaedic Surgery

## 2023-02-18 VITALS — BP 151/72 | HR 73 | Ht 62.0 in | Wt 136.0 lb

## 2023-02-18 DIAGNOSIS — M542 Cervicalgia: Secondary | ICD-10-CM

## 2023-02-18 DIAGNOSIS — M25551 Pain in right hip: Secondary | ICD-10-CM

## 2023-02-18 MED ORDER — TRAMADOL HCL 50 MG PO TABS: 50.0000 mg | ORAL_TABLET | Freq: Three times a day (TID) | ORAL | 0 refills | Status: DC | PRN

## 2023-02-18 NOTE — Progress Notes (Signed)
Office Visit Note   Patient: Tony Mcmillan           Date of Birth: 10-Sep-1941           MRN: [id number] Visit Date: 02/18/2023              Requested by: Orma Render, NP [place],  [place] [address] PCP: Orma Render, NP   Assessment & Plan: Visit Diagnoses:  1. Pain in right hip   2. Neck pain     Plan: Patient with likely hip flexor strain.  No lumbar films were obtained.  He denies current back pain at this point.  Will recheck him in 4 weeks to use the cane or can borrow a walker if needed.  Recheck 4 weeks.  Follow-Up Instructions: Return in about 4 weeks (around 03/18/2023).   Orders:  Orders Placed This Encounter  Procedures   XR HIP UNILAT W OR W/O PELVIS 2-3 VIEWS RIGHT   XR Cervical Spine 2 or 3 views   Meds ordered this encounter  Medications   traMADol (ULTRAM) 50 MG tablet    Sig: Take 1 tablet (50 mg total) by mouth every 8 (eight) hours as needed.    Dispense:  30 tablet    Refill:  0      Procedures: No procedures performed   Clinical Data: No additional findings.   Subjective: Chief Complaint  Patient presents with   Neck - Pain    Fall 02/17/2023   Right Hip - Pain    Fall 02/17/2023    HPI 82 year old male fell yesterday in the house only fall in the last several years when he fell onto the dog gate onto his right side.  No bruising or laceration.  He said significant right iliac crest pain and sharp pain with attempts at hip flexion.  He has on 81 mg aspirin daily.  Patient's been ambulating with a cane post cervical surgery when he had some weakness and balance problems he used a walker but is given the walker away.  Additional history of hypertension high cholesterol, bladder thickening, aortic valve calcification, osteoarthritis lumbar spine rheumatoid arthritis.  Review of Systems all other systems noncontributory to HPI.   Objective: Vital Signs: BP (!) 151/72   Pulse 73   Ht '5\' 2"'$  (1.575 m)   Wt 136 lb (61.7  kg)   BMI 24.87 kg/m   Physical Exam Constitutional:      Appearance: He is well-developed.  HENT:     Head: Normocephalic and atraumatic.     Right Ear: External ear normal.     Left Ear: External ear normal.  Eyes:     Pupils: Pupils are equal, round, and reactive to light.  Neck:     Thyroid: No thyromegaly.     Trachea: No tracheal deviation.  Cardiovascular:     Rate and Rhythm: Normal rate.  Pulmonary:     Effort: Pulmonary effort is normal.     Breath sounds: No wheezing.  Abdominal:     General: Bowel sounds are normal.     Palpations: Abdomen is soft.  Musculoskeletal:     Cervical back: Neck supple.  Skin:    General: Skin is warm and dry.     Capillary Refill: Capillary refill takes less than 2 seconds.  Neurological:     Mental Status: He is alert and oriented to person, place, and time.  Psychiatric:        Behavior: Behavior normal.  Thought Content: Thought content normal.        Judgment: Judgment normal.     Ortho Exam pelvic ring is stable with palpation and pelvic compression.  Pain and weakness with right hip flexion normal left hip flexion quad strong adductors normal sensory to thighs normal.  Negative logroll the hips.  No bruising is noted on the iliac wing or hip.  Specialty Comments:  No specialty comments available.  Imaging: XR HIP UNILAT W OR W/O PELVIS 2-3 VIEWS RIGHT  Result Date: 02/18/2023 AP pelvis frog-leg right hip obtained and reviewed post fall.  Negative for acute changes.  Right femoral neck pelvis iliac wing and sacrum are normal. Impression: Pelvis and right hip negative for acute injury post fall.  XR Cervical Spine 2 or 3 views  Result Date: 02/18/2023 AP lateral cervical spine images are obtained and reviewed.  No no acute injury noted.  Previous anterior corpectomy C3-C4 expandable cage with anterior plate C2-C5 and posterior bilateral instrumentation C2-C5.  Adjacent C5-6 disc space narrowing and spurring noted.  Impression: Previous anterior posterior fusion C2-C5 as described above.  Radiographs negative for acute changes and there is some adjacent C5-6 spondylosis.    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[place]) 05/09/2022   Absolute anemia 05/09/2022   Mixed hyperlipidemia 01/21/2022   Premature atrial contractions 01/20/2022   CAD (coronary artery disease)    Enlarged prostate 03/10/2020   Aortic valve calcification 03/10/2020   Bladder wall thickening 03/10/2020   Weight loss 11/15/2019   Bilateral carotid bruits 02/09/2019   History of systemic steroid therapy 10/30/2017   Pulmonary emphysema ([place]) 12/18/2016   Rheumatoid arthritis ([place]) 11/04/2016   High risk medication use 11/04/2016   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   Vitamin D deficiency 11/04/2016   Chronic wrist pain 07/02/2016   History of colonic polyps 07/02/2016   History of gastric ulcer 07/02/2016   Asymptomatic microscopic hematuria 04/03/2016   Benign prostatic hyperplasia 04/03/2016   Renal cyst, left 04/03/2016   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 [facility].     Atypical chest pain    a. 09/2016 MV: EF 63%, normal perfusion.   Benign prostatic hyperplasia 04/03/2016   Asymptomatic. Normal PSA and exam by Dr. [facility] 01/2016.     Bilateral carotid bruits 02/09/2019   Bladder wall thickening 03/10/2020    CAD (coronary artery disease)    Coronary artery calcification on Chest CT in 09/2016 and 01/2019 // Myoview 10/17: normal perfusion // Myoview 01/2019: no ischemia or infarction, EF 58 // Coronary CTA 03/2020: Calcium Score 1067; non-obstructive CAD [oLM 1-24, LAD ost 25049, mid and dist 1-24; D1 1-24; LCx prox and mid 1-24, OM1 and OM2 25-49, RCA and PLB 1-24; PLA 1-24]    Carotid bruit    Carotid Dopplers 01/2019: no ICA stenosis bilaterally    Colonic polyp 02/23/2016   COVID-19    DDD (degenerative  disc disease), 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 L4-5 and [id number]    Diastolic dysfunction    a. 2015 Echo: EF >55%;  b. 09/2016 Echo: EF 55-60%, no rwma, Gr1 DD, triv AI/MR, mild TR, PASP 55mHg.   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   On CT 01/2016    Palpitations    Echo 10/17: Mild focal basal septal hypertrophy, EF 55-60, no RWMA, Gr 1 DD, trivial MR, normal RVSF, mild TR, PASP 28 // Event monitor 01/2019: NSR, PACs   Prediabetes 10/29/2018   [place]ulmonary emph[place]sema ([place] 12/18/2016   Renal cyst, left 04/03/2016   On CT, benign    [place]umatoid arthritis ([place]    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[place] 11/04/2016   -RF,-CCP,-1433eta, erosive disease with contractures   Tubular adenoma of colon 01/2016   Vitamin D deficiency 11/04/2016    Family History  Problem Relation Age of Onset   Hypertension Mother    Hypertension Father    Diabetes Father    Heart disease Father        stents   Hyperlipidemia Sister    Diabetes Sister    Stroke Brother    Diabetes Brother    Heart disease Brother        stents   Colon polyps Neg Hx    Esophageal  cancer Neg Hx    Stomach cancer Neg Hx    Rectal cancer Neg Hx    Colon cancer Neg Hx     Past Surgical History:  Procedure Laterality Date   ANTERIOR CERVICAL CORPECTOMY     ANTERIOR CERVICAL CORPECTOMY  12/2014   for infection. [place]    [place] eyes   COLONOSCOPY     COLOSTOMY REVERSAL     had infection on buttocks and had to have a skin graft- had colostomy to help area stay clean and heal   HERNIA REPAIR     POLYPECTOMY     SPINE SURGERY     Social History   Occupational History   Not on file  Tobacco Use   Smoking status: Former    Packs/day: 1.00    Years: 17.00    Total pack years: 17.00    Types: Cigarettes    Quit date: 12/23/1993    Years since quitting: 29.1   Smokeless tobacco: Never  Vaping Use   Vaping Use: Never used  Substance and Sexual Activity   Alcohol use: No    Alcohol/week: 0.0 standard drinks of alcohol   Drug use: No   Sexual activity: Yes

## 2023-03-15 ENCOUNTER — Other Ambulatory Visit: Payer: Self-pay | Admitting: Gastroenterology

## 2023-03-18 ENCOUNTER — Other Ambulatory Visit (INDEPENDENT_AMBULATORY_CARE_PROVIDER_SITE_OTHER): Payer: Medicare HMO

## 2023-03-18 ENCOUNTER — Encounter: Payer: Self-pay | Admitting: Orthopaedic Surgery

## 2023-03-18 ENCOUNTER — Ambulatory Visit: Payer: Medicare HMO | Admitting: Orthopaedic Surgery

## 2023-03-18 VITALS — BP 149/73 | Ht 62.0 in | Wt 136.0 lb

## 2023-03-18 DIAGNOSIS — M79642 Pain in left hand: Secondary | ICD-10-CM | POA: Diagnosis not present

## 2023-03-18 DIAGNOSIS — M069 Rheumatoid arthritis, unspecified: Secondary | ICD-10-CM | POA: Diagnosis not present

## 2023-03-18 NOTE — Progress Notes (Signed)
Office Visit Note   Patient: Tony Mcmillan           Date of Birth: Mar 14, 1941           MRN: [id number] Visit Date: 03/18/2023              Requested by: Orma Render, NP [place],  [place] [id number] PCP: Orma Render, NP   Assessment & Plan: Visit Diagnoses:  1. Pain in left hand   2. Rheumatoid arthritis, involving unspecified site, unspecified whether rheumatoid factor present ([place])     Plan: Placed in a wrist splint for his left wrist he can use intermittently as needed for symptomatic relief.  We discussed surgical treatment options include wrist fusion which would leave him with a stiff wrist.  He has such severe arthritic changes multiple sites I think he will function well just using the brace off and on.  70 increased problems he will let us know.  His hip flexor strain has significantly improved he is walking much better and I will check him back again on an as-needed basis.   Follow-Up Instructions: Return if symptoms worsen or fail to improve.   Orders:  Orders Placed This Encounter  Procedures   XR Hand Complete Left   No orders of the defined types were placed in this encounter.     Procedures: No procedures performed   Clinical Data: No additional findings.   Subjective: Chief Complaint  Patient presents with   Right Hip - Follow-up, Pain    Fall 02/17/2023   Left Hand - Pain    HPI 82 year old male with seronegative rheumatoid arthritis for many years recently on Biolog[employer] for just a few years is seen for follow-up with a right hip flexor strain.  Hips better he is walking better ambulating with a cane.  He is having significant problems with pain in his left wrist and previous radiographs few years ago showed severe erosive changes at the radiocarpal joint radial ulnar joint ulnar carpal joints both wrists worse on the left than right.  Review of Systems patient with rheumatoid arthritis on Remicade.   Objective: Vital Signs: BP  (!) 149/73   Ht 5\' 2"  (1.575 m)   Wt 136 lb (61.7 kg)   BMI 24.87 kg/m   Physical Exam HENT:     Head: Normocephalic.     Right Ear: External ear normal.     Left Ear: External ear normal.  Eyes:     Extraocular Movements: Extraocular movements intact.  Cardiovascular:     Rate and Rhythm: Normal rate.     Pulses: Normal pulses.  Pulmonary:     Effort: Pulmonary effort is normal.  Musculoskeletal:     Cervical back: Rigidity present.  Neurological:     Mental Status: He is alert.     Ortho Exam patient with tenderness left wrist joint less than 50% dorsiflexion palmar flexion radial and ulnar deviation.  EPL is intact.  Specialty Comments:  No specialty comments available.  Imaging: XR Hand Complete Left  Result Date: 03/18/2023 Three-view x-rays left hand show severe erosive changes with carpal bones eroding into the distal radius and ulna with about 50% of the carpal row gone.  Some cystic changes in the metaphyseal region of the distal radius. 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   Absolute anemia 05/09/2022   Mixed hyperlipidemia 01/21/2022   Premature atrial contractions 01/20/2022   CAD (coronary artery disease)    Enlarged prostate 03/10/2020   Aortic valve calcification 03/10/2020   Bladder wall thickening 03/10/2020   Weight loss 11/15/2019   Bilateral carotid bruits 02/09/2019   History of systemic steroid therapy 10/30/2017   Pulmonary emphysema ([place]) 12/18/2016   Rheumatoid arthritis ([place]) 11/04/2016   High risk medication use 11/04/2016   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   Vitamin D deficiency  11/04/2016   Chronic wrist pain 07/02/2016   History of colonic polyps 07/02/2016   History of gastric ulcer 07/02/2016   Asymptomatic microscopic hematuria 04/03/2016   Benign prostatic hyperplasia 04/03/2016   Renal cyst, left 04/03/2016   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 Pilar Jarvis [facility].     Atypical chest pain    a. 09/2016 MV: EF 63%, normal perfusion.   Benign prostatic hyperplasia 04/03/2016   Asymptomatic. Normal PSA and exam by Dr. Pilar Jarvis [facility] 01/2016.     Bilateral carotid bruits 02/09/2019   Bladder wall thickening 03/10/2020   CAD (coronary artery disease)    Coronary artery calcification on Chest CT in 09/2016 and 01/2019 // Myoview 10/17: normal perfusion // Myoview 01/2019: no ischemia or infarction, EF 58 // Coronary CTA 03/2020: Calcium Score 1067; non-obstructive CAD [oLM 1-24, LAD ost 25049, mid and dist 1-24; D1 1-24; LCx prox and mid 1-24, OM1 and OM2 25-49, RCA and PLB 1-24; PLA 1-24]    Carotid bruit    Carotid Dopplers 01/2019: no ICA stenosis bilaterally    Colonic polyp 02/23/2016   COVID-19    DDD (degenerative disc disease), 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 L4-5 and [id number]    Diastolic dysfunction    a. 2015 Echo: EF >55%;  b. 09/2016 Echo: EF 55-60%, no rwma, Gr1 DD, triv AI/MR, mild TR, PASP 80mmHg.   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   On CT 01/2016    Palpitations    Echo 10/17: Mild focal basal septal hypertrophy, EF 55-60, no RWMA, Gr 1 DD, trivial MR, normal RVSF, mild TR, PASP 28 // Event monitor 01/2019: NSR, PACs   Prediabetes 10/29/2018   Pulmonary emphysema ([place]) 12/18/2016   Renal cyst, left 04/03/2016   On CT, benign    Rheumatoid arthritis ([place])    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[place]) 11/04/2016   -RF,-CCP,-[id number], erosive disease with contractures   Tubular adenoma of colon 01/2016   Vitamin D deficiency 11/04/2016    Family History  Problem Relation Age of Onset   Hypertension Mother    Hypertension Father    Diabetes Father  Heart disease Father        stents   Hyperlipidemia Sister    Diabetes Sister    Stroke Brother    Diabetes Brother    Heart disease Brother        stents   Colon polyps Neg Hx    Esophageal cancer Neg Hx    Stomach cancer Neg Hx    Rectal cancer Neg Hx    Colon cancer Neg Hx     Past Surgical History:  Procedure Laterality Date   ANTERIOR CERVICAL CORPECTOMY     ANTERIOR CERVICAL CORPECTOMY  12/2014   for infection. this was in [place]     both eyes   COLONOSCOPY     COLOSTOMY REVERSAL     had infection on buttocks and had to have a skin graft- had colostomy to help area stay clean and heal   HERNIA REPAIR     POLYPECTOMY     SPINE SURGERY     Social History   Occupational History   Not on file  Tobacco Use   Smoking status: Former    Packs/day: 1.00    Years: 17.00    Additional pack years: 0.00    Total pack years: 17.00    Types: Cigarettes    Quit date: 12/23/1993    Years since quitting: 29.2   Smokeless tobacco: Never  Vaping Use   Vaping Use: Never used  Substance and Sexual Activity   Alcohol use: No    Alcohol/week: 0.0 standard drinks of alcohol   Drug use: No   Sexual activity: Yes

## 2023-03-19 MED ORDER — PREDNISONE 20 MG PO TABS: ORAL_TABLET | ORAL | 0 refills | Status: DC

## 2023-03-19 MED ORDER — AZITHROMYCIN 250 MG PO TABS: ORAL_TABLET | ORAL | 0 refills | Status: DC

## 2023-03-19 NOTE — Progress Notes (Signed)
Called and spoke with patient, he states he did not get my previous VM.  Provided results/recommendations per Rexene Edison NP.  Verified pharmacy and medications sent in.  CXR ordered.  Scheduled for a 4 week f/u with CXR prior.  Advised to arrive by 1:30 pm for check in and CXR.  He verbalized understanding.  Nothing further needed.

## 2023-03-22 ENCOUNTER — Other Ambulatory Visit: Payer: Self-pay

## 2023-03-22 ENCOUNTER — Encounter (HOSPITAL_BASED_OUTPATIENT_CLINIC_OR_DEPARTMENT_OTHER): Payer: Self-pay | Admitting: Emergency Medicine

## 2023-03-22 ENCOUNTER — Emergency Department (HOSPITAL_BASED_OUTPATIENT_CLINIC_OR_DEPARTMENT_OTHER): Payer: Medicare HMO

## 2023-03-22 ENCOUNTER — Emergency Department (HOSPITAL_BASED_OUTPATIENT_CLINIC_OR_DEPARTMENT_OTHER)
Admission: EM | Admit: 2023-03-22 | Discharge: 2023-03-22 | Disposition: A | Discharge status: Home / Self Care | Payer: Medicare HMO | Attending: Emergency Medicine | Admitting: Emergency Medicine

## 2023-03-22 DIAGNOSIS — M542 Cervicalgia: Secondary | ICD-10-CM | POA: Insufficient documentation

## 2023-03-22 DIAGNOSIS — Z7982 Long term (current) use of aspirin: Secondary | ICD-10-CM | POA: Insufficient documentation

## 2023-03-22 MED ORDER — NAPROXEN 375 MG PO TABS: 375.0000 mg | ORAL_TABLET | Freq: Two times a day (BID) | ORAL | 0 refills | Status: DC

## 2023-03-22 MED ORDER — NAPROXEN 250 MG PO TABS
375.0000 mg | ORAL_TABLET | Freq: Once | ORAL | Status: AC
  Administered 2023-03-22: 375 mg via ORAL
  Filled 2023-03-22: qty 2

## 2023-03-22 MED ORDER — ACETAMINOPHEN 500 MG PO TABS: 500.0000 mg | ORAL_TABLET | Freq: Four times a day (QID) | ORAL | 0 refills | Status: DC | PRN

## 2023-03-22 MED ORDER — LIDOCAINE 5 % EX PTCH: 1.0000 | MEDICATED_PATCH | CUTANEOUS | 0 refills | Status: DC

## 2023-03-22 MED ORDER — LIDOCAINE 5 % EX PTCH
1.0000 | MEDICATED_PATCH | CUTANEOUS | Status: DC
  Administered 2023-03-22: 1 via TRANSDERMAL
  Filled 2023-03-22: qty 1

## 2023-03-22 NOTE — ED Provider Notes (Signed)
[facility] Provider Note   CSN: [id number] Arrival date & time: 03/22/23  1431     History  Chief Complaint  Patient presents with   Neck Pain   Torticollis    Tony Mcmillan is a 82 y.o. male with a past medical history of cervical degenerative disc disease status post surgical intervention presenting today with complaint of neck pain.  He reports it has been ongoing but has not been bothering him very much until yesterday.  Says it is worse with turning his head, especially to the right.  No fevers, chills, recent travel, blurred vision or headaches.  Does not radiate.  No difficulties with the upper extremities.  He follows with orthopedics and says that he recently had a neck x-ray.  Also says he has tried Tylenol for the pain but it is not helping.  Daughter at bedside says that he fell around 2 months ago prior to being seen by the orthopedics and may have struck his neck.   Neck Pain Associated symptoms: no fever and no photophobia        Home Medications Prior to Admission medications   Medication Sig Start Date End Date Taking? Authorizing Provider  azithromycin (ZITHROMAX) 250 MG tablet Take 2 tables today and then 1 tablet daily until gone. 03/19/23   Parrett, Fonnie Mu, NP  lidocaine (LIDODERM) 5 % Place 1 patch onto the skin daily. Remove & Discard patch within 12 hours or as directed by MD 03/22/23  Yes ,  A, PA-C  naproxen (NAPROSYN) 375 MG tablet Take 1 tablet (375 mg total) by mouth 2 (two) times daily. 03/22/23  Yes ,  A, PA-C  predniSONE (DELTASONE) 20 MG tablet Take 20 mg daily for 5 days with food. 03/19/23   Parrett, Fonnie Mu, NP  albuterol (VENTOLIN HFA) 108 (90 Base) MCG/ACT inhaler Inhale 2 puffs into the lungs every 6 (six) hours as needed for wheezing or shortness of breath. 11/08/21   Brand Males, MD  aspirin EC 81 MG tablet Take 1 tablet (81 mg total) by mouth daily. Swallow whole.  08/28/22   Richardson Dopp T, PA-C  atorvastatin (LIPITOR) 40 MG tablet TAKE 1 TABLET BY MOUTH EVERY DAY 02/27/22   Josue Hector, MD  Calcium Carb-Cholecalciferol (CALCIUM-VITAMIN D3) 600-400 MG-UNIT TABS Take 1 tablet by mouth 2 (two) times daily.    [provider]  docusate sodium (COLACE) 250 MG capsule Take 250 mg by mouth daily.    [provider]  ferrous sulfate 325 (65 FE) MG EC tablet Take 1 tablet (325 mg total) by mouth 2 (two) times daily. 05/27/22   Owens Shark, NP  Glycopyrrolate-Formoterol (BEVESPI AEROSPHERE) 9-4.8 MCG/ACT AERO Inhale 2 puffs into the lungs in the morning and at bedtime. 11/13/22   Parrett, Fonnie Mu, NP  inFLIXimab (REMICADE IV) Inject 100 mg into the vein as needed (for arthrithis).    [provider]  isosorbide mononitrate (IMDUR) 30 MG 24 hr tablet TAKE 1/2 OF A TABLET (15 MG TOTAL) BY MOUTH DAILY 02/12/23   Richardson Dopp T, PA-C  latanoprost (XALATAN) 0.005 % ophthalmic solution SMARTSIG:1 Drop(s) In Eye(s) Every Evening 01/11/23   [provider]  leflunomide (ARAVA) 20 MG tablet Take 20 mg by mouth daily.    [provider]  metoprolol succinate (TOPROL-XL) 25 MG 24 hr tablet Take 1 tablet (25 mg total) by mouth daily. 12/30/22   Freada Bergeron, MD  MYRBETRIQ 50 MG [phone]  tablet Take 50 mg by mouth daily.  02/07/19   [provider]  nitroGLYCERIN (NITROSTAT) 0.4 MG SL tablet PLACE 1 TABLET UNDER THE TONGUE EVERY 5 MINUTES AS NEEDED FOR CHEST PAIN. 01/06/23   Freada Bergeron, MD  pantoprazole (PROTONIX) 40 MG tablet Take 1 tablet (40 mg total) by mouth daily. Please schedule a yearly follow up for further refills. Thank you 03/17/23   Ladene Artist, MD  polyethylene glycol (MIRALAX) 17 g packet Take 17 g by mouth daily as needed. 05/27/22   Owens Shark, NP  REMICADE 100 MG injection SMARTSIG:7 Milligram(s) IV Every 4 Weeks 03/05/23   [provider]  traMADol (ULTRAM) 50 MG tablet Take 1 tablet  (50 mg total) by mouth every 8 (eight) hours as needed. 02/18/23   Marybelle Killings, MD      Allergies    Patient has no known allergies.    Review of Systems   Review of Systems  Constitutional:  Negative for chills and fever.  Eyes:  Negative for photophobia and pain.  Musculoskeletal:  Positive for neck pain and neck stiffness. Negative for arthralgias and back pain.    Physical Exam Updated Vital Signs BP (!) 163/66 (BP Location: Left Arm)   Pulse 66   Temp 98 F (36.7 C)   Resp 16   Ht 5\' 2"  (1.575 m)   Wt 60.3 kg   SpO2 99%   BMI 24.33 kg/m  Physical Exam Vitals and nursing note reviewed.  Constitutional:      Appearance: Normal appearance.  HENT:     Head: Normocephalic and atraumatic.  Eyes:     General: No scleral icterus.    Conjunctiva/sclera: Conjunctivae normal.  Neck:     Comments: Full range of motion of the cervical spine intact.  Majority of pain is provoked by rotating his head to the right.  Tenderness along the left SCM and trapezius.  No midline tenderness. Pulmonary:     Effort: Pulmonary effort is normal. No respiratory distress.  Skin:    Findings: No rash.  Neurological:     Mental Status: He is alert.     Comments: Cranial nerves II through XII grossly intact.  No meningeal signs or photophobia.  Normal upper extremity exam  Psychiatric:        Mood and Affect: Mood normal.     ED Results / Procedures / Treatments   Labs (all labs ordered are listed, but only abnormal results are displayed) Labs Reviewed - No data to display  EKG None  Radiology No results found.  Procedures Procedures   Medications Ordered in ED Medications  naproxen (NAPROSYN) tablet 375 mg (has no administration in time range)  lidocaine (LIDODERM) 5 % 1 patch (has no administration in time range)    ED Course/ Medical Decision Making/ A&P                             Medical Decision Making Amount and/or Complexity of Data Reviewed Radiology:  ordered.  Risk OTC drugs. Prescription drug management.   Very pleasant 82 year old male presenting today with neck pain.  Differential includes but is not limited to cervical muscle strain, spondylosis, DDD, torticollis, meningitis, arterial dissection.  This is not exhaustive.  Per chart review patient was seen by orthopedic surgery 6 weeks ago and complained of neck pain.  They noted that in 2015 he had a "cervical spine surgery with collapse and  kyphosis and stenosis."  Orthopedic did obtain a plain film that showed no acute findings.  The read is as follows:  AP lateral cervical spine images are obtained and reviewed this shows anterior vertebral resection with expandable cage and screws and C3 and C6 spanning expandable cage. There is posterior instrumented screws and rods from C3-C6. No comparison previous images are available. Impression: Anterior posterior fusion procedure C3-C6 with anterior expandable cage. Mild residual kyphosis.   Physical exam: Reproducible tenderness over the left trapezius and SCM.  Majority of pain when he rotates his head to the right and extends these muscles.  No midline tenderness.  Normal upper extremity exam.  No photophobia or meningismus  Treatment: Naproxen and lidocaine patch, on reassessment reports improvement   Imaging: CT ordered due to potential trauma during his fall.  I viewed and interpreted this and agree with radiology that there are no acute findings  MDM/disposition: 82 year old male presenting today with neck pain.  Considered meningitis however vital signs are stable and patient has no risk factors for this.  Considered vertebral artery dissection however patient is denying any visual disturbances, radiation, dizziness or migraine/headache history.  Believe this is lower likelihood given normal upper extremity exam as well.  CT scan did not show any DDD/herniated disc.  At this time I do not believe the patient has an emergent condition  requiring any further intervention.  He has a history of PUD so he will be strictly counseled on NSAID use.  Will continue with Tylenol as well.  Also appropriate to continue Lidoderm patches.  He will follow-up with orthopedics and/or PCP.  He and his family are agreeable and thankful for his care today.  Final Clinical Impression(s) / ED Diagnoses Final diagnoses:  Neck pain    Rx / DC Orders ED Discharge Orders          Ordered    naproxen (NAPROSYN) 375 MG tablet  2 times daily        03/22/23 1517    lidocaine (LIDODERM) 5 %  Every 24 hours        03/22/23 1517           Results and diagnoses were explained to the patient. Return precautions discussed in full. Patient had no additional questions and expressed complete understanding.   This chart was dictated using voice recognition software.  Despite best efforts to proofread,  errors can occur which can change the documentation meaning.    Darliss Ridgel 03/22/23 1614    Tretha Sciara, MD 03/22/23 1911

## 2023-03-22 NOTE — ED Triage Notes (Signed)
Pt caox4, ambulatory with cane in triage c/o worsening neck pain/stiffness that has been chronic but started hurting more last night. Pt has been evaluated by ortho recently. Denies any trauma/event correlating with the increased pain.

## 2023-03-22 NOTE — Discharge Instructions (Addendum)
You came to the emergency department today with pain in your neck.  As we discussed, I sent 3 medications to your pharmacy. Naprosyn.  This is a medication similar to ibuprofen so please do not take ibuprofen at the same time.  These medications increase your risk for stomach ulcer so please take them with food. Lidocaine patches.  You may use 1-3 patches at the same time.  The most important thing is that you may not wear any patches for more than 12 hours at a time.  In other words, you must have 12 hours patch free each day. Extra strength Tylenol.  You may take this during the day as well.  Ultimately, please follow-up with the orthopedic doctor.  If you are unable to see them, please see your PCP.  Do not hesitate to return with any worsening symptoms, especially severe headaches, blurred vision, fevers, worsening stiffness or any further concerns.

## 2023-03-24 ENCOUNTER — Other Ambulatory Visit: Payer: Self-pay

## 2023-03-24 ENCOUNTER — Emergency Department (HOSPITAL_BASED_OUTPATIENT_CLINIC_OR_DEPARTMENT_OTHER)
Admission: EM | Admit: 2023-03-24 | Discharge: 2023-03-24 | Disposition: A | Discharge status: Home / Self Care | Payer: Medicare HMO | Attending: Emergency Medicine | Admitting: Emergency Medicine

## 2023-03-24 ENCOUNTER — Emergency Department (HOSPITAL_BASED_OUTPATIENT_CLINIC_OR_DEPARTMENT_OTHER): Payer: Medicare HMO

## 2023-03-24 ENCOUNTER — Encounter (HOSPITAL_BASED_OUTPATIENT_CLINIC_OR_DEPARTMENT_OTHER): Payer: Self-pay | Admitting: Emergency Medicine

## 2023-03-24 DIAGNOSIS — Z7982 Long term (current) use of aspirin: Secondary | ICD-10-CM | POA: Insufficient documentation

## 2023-03-24 DIAGNOSIS — J449 Chronic obstructive pulmonary disease, unspecified: Secondary | ICD-10-CM | POA: Insufficient documentation

## 2023-03-24 DIAGNOSIS — M542 Cervicalgia: Secondary | ICD-10-CM | POA: Insufficient documentation

## 2023-03-24 DIAGNOSIS — I2510 Atherosclerotic heart disease of native coronary artery without angina pectoris: Secondary | ICD-10-CM | POA: Diagnosis not present

## 2023-03-24 DIAGNOSIS — R519 Headache, unspecified: Secondary | ICD-10-CM | POA: Diagnosis not present

## 2023-03-24 NOTE — Discharge Instructions (Addendum)
You were seen in the ER for neck pain and headache.   As we discussed, your CT imaging was normal today. I suspect your headache could be related somewhat to your neck pain. It looks like the previous provider sent a prescription for 30 lidoderm patches to the pharmacy, so please follow up with [facility] to get this prescription.  Please follow up with your doctor if you continue to have persistent headaches.  Continue to monitor how you're doing and return to the ER for new or worsening symptoms.

## 2023-03-24 NOTE — ED Provider Notes (Signed)
[place] EMERGENCY DEPARTMENT AT [facility] Provider Note   CSN: [id number] Arrival date & time: 03/24/23  1815     History  Chief Complaint  Patient presents with   Headache   Neck Pain    Tony Mcmillan is a 82 y.o. male with history of BPH, rheumatoid arthritis, emphysema, CAD, HLD, COPD, cervical degenerative disc disease who presents to the ER complaining of ongoing neck pain and headache. Patient seen in ER for neck pain the other day, states that the lidoderm patch he was given seemed to help. Developed a headache today, took some naproxen. Headache has mostly resolved at this time. No blurry vision, photophobia, weakness, numbness, slurred speech, facial droop.    Headache Associated symptoms: neck pain   Neck Pain Associated symptoms: headaches        Home Medications Prior to Admission medications   Medication Sig Start Date End Date Taking? Authorizing Provider  azithromycin (ZITHROMAX) 250 MG tablet Take 2 tables today and then 1 tablet daily until gone. 03/19/23   Parrett, Fonnie Mu, NP  predniSONE (DELTASONE) 20 MG tablet Take 20 mg daily for 5 days with food. 03/19/23   Parrett, Fonnie Mu, NP  acetaminophen (TYLENOL) 500 MG tablet Take 1 tablet (500 mg total) by mouth every 6 (six) hours as needed. 03/22/23   Redwine, Madison A, PA-C  albuterol (VENTOLIN HFA) 108 (90 Base) MCG/ACT inhaler Inhale 2 puffs into the lungs every 6 (six) hours as needed for wheezing or shortness of breath. 11/08/21   Brand Males, MD  aspirin EC 81 MG tablet Take 1 tablet (81 mg total) by mouth daily. Swallow whole. 08/28/22   Richardson Dopp T, PA-C  atorvastatin (LIPITOR) 40 MG tablet TAKE 1 TABLET BY MOUTH EVERY DAY 02/27/22   Josue Hector, MD  Calcium Carb-Cholecalciferol (CALCIUM-VITAMIN D3) 600-400 MG-UNIT TABS Take 1 tablet by mouth 2 (two) times daily.    [provider]  docusate sodium (COLACE) 250 MG capsule Take 250 mg by mouth daily.    [provider]   ferrous sulfate 325 (65 FE) MG EC tablet Take 1 tablet (325 mg total) by mouth 2 (two) times daily. 05/27/22   Owens Shark, NP  Glycopyrrolate-Formoterol (BEVESPI AEROSPHERE) 9-4.8 MCG/ACT AERO Inhale 2 puffs into the lungs in the morning and at bedtime. 11/13/22   Parrett, Fonnie Mu, NP  inFLIXimab (REMICADE IV) Inject 100 mg into the vein as needed (for arthrithis).    [provider]  isosorbide mononitrate (IMDUR) 30 MG 24 hr tablet TAKE 1/2 OF A TABLET (15 MG TOTAL) BY MOUTH DAILY 02/12/23   Richardson Dopp T, PA-C  latanoprost (XALATAN) 0.005 % ophthalmic solution SMARTSIG:1 Drop(s) In Eye(s) Every Evening 01/11/23   [provider]  leflunomide (ARAVA) 20 MG tablet Take 20 mg by mouth daily.    [provider]  lidocaine (LIDODERM) 5 % Place 1 patch onto the skin daily. Remove & Discard patch within 12 hours or as directed by MD 03/22/23   Redwine, Madison A, PA-C  metoprolol succinate (TOPROL-XL) 25 MG 24 hr tablet Take 1 tablet (25 mg total) by mouth daily. 12/30/22   Freada Bergeron, MD  MYRBETRIQ 50 MG TB24 tablet Take 50 mg by mouth daily.  02/07/19   [provider]  naproxen (NAPROSYN) 375 MG tablet Take 1 tablet (375 mg total) by mouth 2 (two) times daily. 03/22/23   Redwine, Madison A, PA-C  nitroGLYCERIN (NITROSTAT) 0.4 MG SL tablet PLACE 1 TABLET  UNDER THE TONGUE EVERY 5 MINUTES AS NEEDED FOR CHEST PAIN. 01/06/23   Freada Bergeron, MD  pantoprazole (PROTONIX) 40 MG tablet Take 1 tablet (40 mg total) by mouth daily. Please schedule a yearly follow up for further refills. Thank you 03/17/23   Ladene Artist, MD  polyethylene glycol (MIRALAX) 17 g packet Take 17 g by mouth daily as needed. 05/27/22   Owens Shark, NP  REMICADE 100 MG injection SMARTSIG:7 Milligram(s) IV Every 4 Weeks 03/05/23   [provider]  traMADol (ULTRAM) 50 MG tablet Take 1 tablet (50 mg total) by mouth every 8 (eight) hours as needed. 02/18/23   Marybelle Killings, MD       Allergies    Patient has no known allergies.    Review of Systems   Review of Systems  Musculoskeletal:  Positive for neck pain.  Neurological:  Positive for headaches.  All other systems reviewed and are negative.   Physical Exam Updated Vital Signs BP (!) 106/57   Pulse (!) 53   Temp 97.8 F (36.6 C)   Resp (!) 21   Ht 5\' 2"  (1.575 m)   Wt 60.3 kg   SpO2 98%   BMI 24.33 kg/m  Physical Exam Vitals and nursing note reviewed.  Constitutional:      Appearance: Normal appearance.  HENT:     Head: Normocephalic and atraumatic.  Eyes:     Conjunctiva/sclera: Conjunctivae normal.  Cardiovascular:     Rate and Rhythm: Normal rate and regular rhythm.  Pulmonary:     Effort: Pulmonary effort is normal. No respiratory distress.     Breath sounds: Normal breath sounds.  Abdominal:     General: There is no distension.     Palpations: Abdomen is soft.     Tenderness: There is no abdominal tenderness.  Musculoskeletal:     Cervical back: Full passive range of motion without pain. No rigidity. No spinous process tenderness.  Skin:    General: Skin is warm and dry.  Neurological:     General: No focal deficit present.     Mental Status: He is alert.     Comments: Neuro: Speech is clear, able to follow commands. CN III-XII intact grossly intact. PERRLA. EOMI. Sensation intact throughout. Str 5/5 all extremities.     ED Results / Procedures / Treatments   Labs (all labs ordered are listed, but only abnormal results are displayed) Labs Reviewed - No data to display  EKG None  Radiology CT Head Wo Contrast  Result Date: 03/24/2023 CLINICAL DATA:  Headache, new onset (Age >= 34y) EXAM: CT HEAD WITHOUT CONTRAST TECHNIQUE: Contiguous axial images were obtained from the base of the skull through the vertex without intravenous contrast. RADIATION DOSE REDUCTION: This exam was performed according to the departmental dose-optimization program which includes automated exposure  control, adjustment of the mA and/or kV according to patient size and/or use of iterative reconstruction technique. COMPARISON:  None Available. FINDINGS: Brain: There is atrophy and chronic small vessel disease changes. No acute intracranial abnormality. Specifically, no hemorrhage, hydrocephalus, mass lesion, acute infarction, or significant intracranial injury. Vascular: No hyperdense vessel or unexpected calcification. Skull: No acute calvarial abnormality. Sinuses/Orbits: No acute findings Other: None IMPRESSION: Atrophy, chronic microvascular disease. No acute intracranial abnormality. Electronically Signed   By: Rolm Baptise M.D.   On: 03/24/2023 23:14    Procedures Procedures    Medications Ordered in ED Medications - No data to display  ED Course/ Medical Decision  Making/ A&P                             Medical Decision Making Amount and/or Complexity of Data Reviewed Radiology: ordered.  This patient is a 82 y.o. male  who presents to the ED for concern of headache and neck pain.  Differential diagnoses prior to evaluation: The emergent differential diagnosis includes, but is not limited to, Stroke, increased ICP, meningitis, CVA, intracranial tumor, venous sinus thrombosis, migraine, cluster headache, hypertension, drug related, head injury, tension headache, sinusitis, dental abscess, otitis media, TMJ, temporal arteritis, glaucoma, trigeminal neuralgia. This is not an exhaustive differential.   Past Medical History / Co-morbidities: BPH, rheumatoid arthritis, emphysema, CAD, HLD, COPD, cervical degenerative disc disease  Additional history: Chart reviewed. Pertinent results include: Reviewed ER visit note from 3/30 in which patient presented complaining of neck pain.  CT cervical spine imaging was ordered and showed postsurgical changes from C2-C6 with intact hardware, chronic appearing fractures.  X-ray performed of the cervical spine on 2/27 consistent with this.  Physical  Exam: Physical exam performed. The pertinent findings include: No cervical midline spinal tenderness, step-offs or crepitus.  Headache has resolved at this time.  Full ROM of the neck without pain.  Normal neurologic exam as above.  Lab Tests/Imaging studies: I personally interpreted labs/imaging and the pertinent results include: CT head shows no acute abnormalities. I agree with the radiologist interpretation.  Disposition: After consideration of the diagnostic results and the patients response to treatment, I feel that emergency department workup does not suggest an emergent condition requiring admission or immediate intervention beyond what has been performed at this time. The plan is: discharge to home with ongoing management of neck pain, likely related to degenerative disc disease, as well as tension headaches. Will recommend continuing to use lidoderm patches and prescribed medications, and follow up with PCP if symptoms persist. Low concern for emergent etiology as patient as unremarkable examination and symptoms have almost entirely resolved. The patient is safe for discharge and has been instructed to return immediately for worsening symptoms, change in symptoms or any other concerns.  Final Clinical Impression(s) / ED Diagnoses Final diagnoses:  Neck pain  Acute nonintractable headache, unspecified headache type    Rx / DC Orders ED Discharge Orders     None      Portions of this report may have been transcribed using voice recognition software. Every effort was made to ensure accuracy; however, inadvertent computerized transcription errors may be present.    Estill Cotta 03/24/23 2335    Davonna Belling, MD 03/24/23 [phone]

## 2023-03-24 NOTE — ED Triage Notes (Addendum)
Pt arrives pov, ambulatory with cane to triage c/o worsening continued neck pain and HA that started last night. Recently seen in ED for same. Denies injury. Pt AOx4, speech clear, VAN neg. Pt endorses naproxen pta.

## 2023-03-24 NOTE — ED Notes (Signed)
Pt discharged to home, NAD noted at discharge

## 2023-03-25 ENCOUNTER — Ambulatory Visit: Payer: Medicare HMO | Admitting: Nurse Practitioner

## 2023-03-25 ENCOUNTER — Other Ambulatory Visit: Payer: Self-pay | Admitting: Adult Health

## 2023-03-25 ENCOUNTER — Encounter: Payer: Self-pay | Admitting: Nurse Practitioner

## 2023-03-25 ENCOUNTER — Telehealth: Payer: Self-pay | Admitting: Family Medicine

## 2023-03-25 VITALS — BP 118/60 | HR 71 | Wt 129.8 lb

## 2023-03-25 DIAGNOSIS — W19XXXA Unspecified fall, initial encounter: Secondary | ICD-10-CM | POA: Diagnosis not present

## 2023-03-25 DIAGNOSIS — M542 Cervicalgia: Secondary | ICD-10-CM | POA: Diagnosis not present

## 2023-03-25 NOTE — Progress Notes (Unsigned)
Orma Render, DNP, AGNP-c [facility] [address] [place], [place] [address] [phone]  Subjective:   Tony Mcmillan is a 82 y.o. male presents to day for evaluation of:  Tony Mcmillan presents today for follow-up from recent ED visits. He reports chief complaints of severe neck pain experienced last Saturday, prompting a visit to the hospital where he was given medication. Following this, he began suffering from a severe headache starting Sunday morning. Despite undergoing a CT scan of both his head and neck at the hospital, no abnormalities were found.  He reports that his neck pain has since improved, but he encountered an issue with the pharmacy not providing him with prescribed patches, which were intended to alleviate his neck pain. He notes that the patches, when used at the hospital, significantly helped reduce his pain.  Tony Mcmillan also recalls a recent fall he experienced three to four weeks ago, during which he did not lose consciousness and remained awake afterward. He denies any symptoms of shortness of breath, chest pain, or unusual weakness in his arms or legs. 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 He has been prescribed additional medication by his lung doctor and is scheduled for a follow-up visit.  PMH, Medications, and Allergies reviewed and updated in chart as appropriate.   ROS negative except for what is listed in HPI. Objective:  BP 118/60   Pulse 71   Wt 129 lb 12.8 oz (58.9 kg)   SpO2 97%   BMI 23.74 kg/m  Physical Exam Vitals and nursing note reviewed.  Constitutional:      General: He is not in acute distress.    Appearance: Normal appearance. He is normal weight. He is not ill-appearing.  HENT:     Head: Normocephalic and atraumatic.  Eyes:     Extraocular Movements: Extraocular movements intact.     Pupils: Pupils are equal, round, and reactive to light.  Neck:      Vascular: No carotid bruit.  Cardiovascular:     Rate and Rhythm: Normal rate and regular rhythm.     Pulses: Normal pulses.     Heart sounds: Normal heart sounds.  Pulmonary:     Effort: Pulmonary effort is normal.     Breath sounds: Normal breath sounds.  Musculoskeletal:        General: Normal range of motion.     Cervical back: Normal range of motion. Tenderness present.  Lymphadenopathy:     Cervical: No cervical adenopathy.  Skin:    General: Skin is warm and dry.     Capillary Refill: Capillary refill takes less than 2 seconds.  Neurological:     General: No focal deficit present.     Mental Status: He is alert and oriented to person, place, and time.     Cranial Nerves: No cranial nerve deficit.     Sensory: No sensory deficit.     Motor: Weakness present.     Coordination: Coordination normal.     Gait: Gait abnormal.     Comments: Baseline weakness   Psychiatric:        Mood and Affect: Mood normal.           Assessment & Plan:   Problem List Items Addressed This Visit     Fall - Primary    Patient reported a fall approximately 3-4 weeks ago but did not lose consciousness and remained awake after the fall. He is at  his baseline today. He does require ambulation assistance with a cane, but denies worsening weakness or gait concerns.  Plan: - Monitor for any new or worsening symptoms related to the fall - Encourage the patient to report any new symptoms or concerns - Consider PT for gait training if new or worsening symptoms present.       Neck pain    Patient reported neck pain and headache, which led to a hospital visit and CT scans of the head and neck. No significant findings were reported from the scans. The patient's pain has improved since using tylenol and NSAIDs for management. He has been unable to get his lidocaine patch from the pharmacy, but plans to go pick that up today. He will let us know if there is difficulty in obtaining this. He is stable  today with no concerning symptoms. Plan: - Continue using the prescribed medications as needed for pain relief - If unable to get the pain patch from the pharmacy, please let us know.  - Encourage the patient to call the clinic if the headache returns or worsens - Consider further evaluation or alternative treatments if the pain persists or worsens         Orma Render, DNP, AGNP-c 03/26/2023  7:40 AM    History, Medications, Surgery, SDOH, and Family History reviewed and updated as appropriate.

## 2023-03-25 NOTE — Telephone Encounter (Signed)
Pt called and said issue at pharmacy.  I called pharmacy and they said Lidocaine patches need prior auth. They sent earlier to wrong location.  Will resend to [place].   OOP is 300.00

## 2023-03-25 NOTE — Patient Instructions (Signed)
If your headache or neck pain comes back, please let me know. You can continue to take the medication as needed.

## 2023-03-25 NOTE — Telephone Encounter (Signed)
[employer]  ID [id number] BIN [id number] PCN [id number] [id number]  Pharmacy number [phone]   Completed this through covermymeds. Waiting on response

## 2023-03-26 DIAGNOSIS — M542 Cervicalgia: Secondary | ICD-10-CM | POA: Insufficient documentation

## 2023-03-26 DIAGNOSIS — W19XXXA Unspecified fall, initial encounter: Secondary | ICD-10-CM | POA: Insufficient documentation

## 2023-03-26 DIAGNOSIS — M5481 Occipital neuralgia: Secondary | ICD-10-CM | POA: Insufficient documentation

## 2023-03-26 HISTORY — DX: Unspecified fall, initial encounter: W19.XXXA

## 2023-03-26 NOTE — Telephone Encounter (Signed)
Lidocaine 5% patch has been denied due to him not having pain from Shingles, or nerve pain associated with diabetes or chronic back pain.

## 2023-03-26 NOTE — Assessment & Plan Note (Addendum)
Patient reported neck pain and headache, which led to a hospital visit and CT scans of the head and neck. No significant findings were reported from the scans. The patient's pain has improved since using tylenol and NSAIDs for management. He has been unable to get his lidocaine patch from the pharmacy, but plans to go pick that up today. He will let us know if there is difficulty in obtaining this. He is stable today with no concerning symptoms. Plan: - Continue using the prescribed medications as needed for pain relief - If unable to get the pain patch from the pharmacy, please let us know.  - Encourage the patient to call the clinic if the headache returns or worsens - Consider further evaluation or alternative treatments if the pain persists or worsens

## 2023-03-26 NOTE — Telephone Encounter (Signed)
Pt was notified of results

## 2023-03-26 NOTE — Telephone Encounter (Signed)
Please let him know that he can get 4% lidocaine patches at any drug store, [facility], etc. These will work just as well as the prescription ones.

## 2023-03-26 NOTE — Assessment & Plan Note (Signed)
Patient reported a fall approximately 3-4 weeks ago but did not lose consciousness and remained awake after the fall. He is at his baseline today. He does require ambulation assistance with a cane, but denies worsening weakness or gait concerns.  Plan: - Monitor for any new or worsening symptoms related to the fall - Encourage the patient to report any new symptoms or concerns - Consider PT for gait training if new or worsening symptoms present.

## 2023-03-26 NOTE — Assessment & Plan Note (Signed)
>>  ASSESSMENT AND PLAN FOR NECK PAIN WRITTEN ON 03/26/2023  7:40 AM BY ,  E, NP  Patient reported neck pain and headache, which led to a hospital visit and CT scans of the head and neck. No significant findings were reported from the scans. The patient's pain has improved since using tylenol and NSAIDs for management. He has been unable to get his lidocaine patch from the pharmacy, but plans to go pick that up today. He will let us know if there is difficulty in obtaining this. He is stable today with no concerning symptoms. Plan: - Continue using the prescribed medications as needed for pain relief - If unable to get the pain patch from the pharmacy, please let us know.  - Encourage the patient to call the clinic if the headache returns or worsens - Consider further evaluation or alternative treatments if the pain persists or worsens

## 2023-04-10 ENCOUNTER — Other Ambulatory Visit: Payer: Self-pay | Admitting: Gastroenterology

## 2023-04-17 ENCOUNTER — Ambulatory Visit: Payer: Medicare HMO | Admitting: Adult Health

## 2023-04-17 ENCOUNTER — Ambulatory Visit (INDEPENDENT_AMBULATORY_CARE_PROVIDER_SITE_OTHER): Payer: Medicare HMO

## 2023-04-17 ENCOUNTER — Encounter: Payer: Self-pay | Admitting: Adult Health

## 2023-04-17 VITALS — BP 116/56 | HR 71 | Temp 97.5°F | Ht 62.0 in | Wt 131.0 lb

## 2023-04-17 DIAGNOSIS — R9389 Abnormal findings on diagnostic imaging of other specified body structures: Secondary | ICD-10-CM | POA: Diagnosis not present

## 2023-04-17 DIAGNOSIS — J449 Chronic obstructive pulmonary disease, unspecified: Secondary | ICD-10-CM

## 2023-04-17 MED ORDER — ALBUTEROL SULFATE HFA 108 (90 BASE) MCG/ACT IN AERS: 2.0000 | INHALATION_SPRAY | Freq: Four times a day (QID) | RESPIRATORY_TRACT | 1 refills | Status: DC | PRN

## 2023-04-17 MED ORDER — BEVESPI AEROSPHERE 9-4.8 MCG/ACT IN AERO: 2.0000 | INHALATION_SPRAY | Freq: Two times a day (BID) | RESPIRATORY_TRACT | 1 refills | Status: DC

## 2023-04-17 NOTE — Patient Instructions (Addendum)
Mucinex DM Twice daily  As needed  cough/congestion  Continue on BEVESPI 2 puffs Twice daily Albuterol inhaler As needed   Activity as tolerated  Follow up with Dr. Marchelle Gearing in 6 months and As needed   Please contact office for sooner follow up if symptoms do not improve or worsen or seek emergency care

## 2023-04-17 NOTE — Progress Notes (Signed)
@Patient  ID: Tony Mcmillan, male    DOB: Apr 14, 1941, 82 y.o.   MRN: [id number]  Chief Complaint  Patient presents with   Follow-up    Referring provider: Tollie Eth, NP  HPI: 82 year old male former smoker followed for COPD with emphysema Previous nocturnal hypoxemia on oxygen-oxygen stopped Medical history significant for rheumatoid arthritis, and iron deficiency anemia  TEST/EVENTS :  HRCT chest January 2022 negative for ILD, elevation of right hemidiaphragm with associated scarring and atelectasis in the right base, emphysema    PFT 10/2020 Mild to moderate restriction with decreased diffucing capacity , FEV1 80%, ratio 81, FVC 71%, diffusing capacity 60%   2D echo October 2017 EF 55 to 60%, grade 1 diastolic dysfunction, normal pulmonary artery systolic pressure   Stress Myoview March 2022 EF at 72%, low risk study.  04/17/2023 Follow up ; COPD with Emphysema  Patient presents for a 61-month follow-up.  Patient was seen last visit with a COPD exacerbation with possible superimposed pneumonia.  Chest x-ray showed mild diffuse bilateral interstitial opacities.  Patient was treated with a Z-Pak and a prednisone burst.  Patient says he is feeling better.  Decreased cough and congestion.  Continues to get short of breath with some activities.  But overall activity level is at baseline.  He denies any hemoptysis chest pain orthopnea PND.  Chest x-ray today shows clear lungs He remains on Bevespi twice daily.  No increased albuterol use.  No Known Allergies  Immunization History  Administered Date(s) Administered   Influenza, High Dose Seasonal PF 07/23/2017, 08/05/2019, 08/30/2020, 08/22/2021, 08/10/2022   Influenza,inj,Quad PF,6+ Mos 08/01/2017   Influenza-Unspecified 10/30/2015, 09/13/2016, 07/30/2018, 08/05/2019   PFIZER Comirnaty(Gray Top)Covid-19 Tri-Sucrose Vaccine 05/12/2021, 09/23/2022   PFIZER(Purple Top)SARS-COV-2 Vaccination 01/28/2020, 02/18/2020, 09/19/2020    PNEUMOCOCCAL CONJUGATE-20 08/17/2021   Pfizer Covid-19 Vaccine Bivalent Booster 36yrs & up 11/05/2021   Pneumococcal Conjugate-13 12/05/2015   Pneumococcal Polysaccharide-23 04/23/2017   Rsv, Bivalent, Protein Subunit Rsvpref,pf Verdis Frederickson) 03/14/2023   Tdap 12/09/2015   Zoster Recombinat (Shingrix) 10/26/2021, 08/20/2022   Zoster, Live 12/09/2015    Past Medical History:  Diagnosis Date   3-vessel coronary artery disease 03/10/2020   Allergy    Anemia    Aortic valve calcification 03/10/2020   Arthritis    Asymptomatic microscopic hematuria 04/03/2016   Cystoscopy 02/207 Dr. [facility].     Atypical chest pain    a. 09/2016 MV: EF 63%, normal perfusion.   Benign prostatic hyperplasia 04/03/2016   Asymptomatic. Normal PSA and exam by Dr. Sherryl [facility] 01/2016.     Bilateral carotid bruits 02/09/2019   Bladder wall thickening 03/10/2020   CAD (coronary artery disease)    Coronary artery calcification on Chest CT in 09/2016 and 01/2019 // Myoview 10/17: normal perfusion // Myoview 01/2019: no ischemia or infarction, EF 58 // Coronary CTA 03/2020: Calcium Score 1067; non-obstructive CAD [oLM 1-24, LAD ost 25049, mid and dist 1-24; D1 1-24; LCx prox and mid 1-24, OM1 and OM2 25-49, RCA and PLB 1-24; PLA 1-24]    Carotid bruit    Carotid Dopplers 01/2019: no ICA stenosis bilaterally    Colonic polyp 02/23/2016   COVID-19    DDD (degenerative disc disease), 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 L4-5 and L5-S1    Diastolic dysfunction    a. 2015 Echo: EF >55%;  b. 09/2016 Echo: EF 55-60%, no rwma, Gr1 DD, triv AI/MR, mild TR, PASP .   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   On CT 01/2016    Palpitations    Echo 10/17: Mild focal basal septal hypertrophy, EF 55-60, no RWMA, Gr 1 DD, trivial MR, normal RVSF, mild TR, PASP 28 // Event monitor 01/2019: NSR, PACs   Prediabetes 10/29/2018   Pulmonary emphysema 12/18/2016   Renal cyst, left 04/03/2016   On CT, benign    Rheumatoid arthritis    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/2016   -RF,-CCP,-[id number], erosive disease with contractures   Tubular adenoma of colon 01/2016   Vitamin D deficiency 11/04/2016    Tobacco History: Social History   Tobacco Use  Smoking Status Former   Packs/day: 1.00   Years: 17.00   Additional pack years: 0.00   Total pack years: 17.00   Types: Cigarettes   Quit date: 12/23/1993   Years since quitting: 29.3  Smokeless Tobacco Never   Counseling given: Not Answered   Outpatient Medications Prior to Visit  Medication Sig Dispense Refill   acetaminophen (TYLENOL) 500 MG tablet Take 1 tablet (500 mg total) by mouth every 6 (six) hours as needed. 30 tablet 0   aspirin EC 81 MG tablet Take 1 tablet (81 mg total) by mouth daily. Swallow whole. 30 tablet 12   atorvastatin (LIPITOR) 40 MG tablet TAKE 1 TABLET BY MOUTH EVERY DAY 90 tablet 3   Calcium Carb-Cholecalciferol (CALCIUM-VITAMIN D3) 600-400 MG-UNIT TABS Take 1 tablet by mouth 2 (two) times daily.     docusate sodium (COLACE) 250 MG capsule Take 250 mg by mouth daily.     ferrous sulfate 325 (65 FE) MG EC tablet Take 1 tablet (325 mg total) by mouth 2 (two) times daily.     isosorbide mononitrate (IMDUR) 30 MG 24 hr tablet TAKE 1/2 OF A TABLET (15 MG TOTAL) BY MOUTH DAILY 45 tablet 1   latanoprost (XALATAN) 0.005 % ophthalmic solution      leflunomide (ARAVA) 20 MG tablet Take 20 mg by mouth daily.     metoprolol succinate (TOPROL-XL) 25 MG 24 hr tablet Take 1 tablet (25 mg total) by mouth daily. 90 tablet 2   MYRBETRIQ 50 MG TB24 tablet Take 50 mg by mouth daily.       naproxen (NAPROSYN) 375 MG tablet Take 1 tablet (375 mg total) by mouth 2 (two) times daily. 30 tablet 0   nitroGLYCERIN (NITROSTAT) 0.4 MG SL tablet PLACE 1 TABLET UNDER THE TONGUE EVERY 5 MINUTES AS NEEDED FOR CHEST PAIN. 75 tablet 2   pantoprazole (PROTONIX) 40 MG tablet Take 1 tablet (40 mg total) by mouth daily. Please schedule a yearly follow up for further refills. Thank you 30 tablet 0   polyethylene glycol (MIRALAX) 17 g packet Take 17 g by mouth daily as needed. 14 each 0   REMICADE 100 MG injection      albuterol (VENTOLIN HFA) 108 (90 Base) MCG/ACT inhaler Inhale 2 puffs into the lungs every 6 (six) hours as needed for wheezing or shortness of breath. 8 g 6   Glycopyrrolate-Formoterol (BEVESPI AEROSPHERE) 9-4.8 MCG/ACT AERO Inhale 2 puffs into the lungs in the morning and at bedtime. 32.1 g 3   azithromycin (ZITHROMAX) 250 MG tablet Take 2 tables today and  then 1 tablet daily until gone. (Patient not taking: Reported on 03/25/2023) 6 tablet 0   inFLIXimab (REMICADE IV) Inject 100 mg into the vein as needed (for arthrithis). (Patient not taking: Reported on 04/17/2023)     lidocaine (LIDODERM) 5 % Place 1 patch onto the skin daily. Remove & Discard patch within 12 hours or as directed by MD (Patient not taking: Reported on 04/17/2023) 30 patch 0   traMADol (ULTRAM) 50 MG tablet Take 1 tablet (50 mg total) by mouth every 8 (eight) hours as needed. (Patient not taking: Reported on 03/25/2023) 30 tablet 0   No facility-administered medications prior to visit.     Review of Systems:   Constitutional:   No  weight loss, night sweats,  Fevers, chills, fatigue, or  lassitude.  HEENT:   No headaches,  Difficulty swallowing,  Tooth/dental problems, or  Sore throat,                No sneezing, itching, ear ache, nasal congestion, post nasal drip,   CV:  No chest pain,  Orthopnea, PND, swelling in lower extremities, anasarca, dizziness, palpitations, syncope.   GI  No heartburn, indigestion,  abdominal pain, nausea, vomiting, diarrhea, change in bowel habits, loss of appetite, bloody stools.   Resp: No shortness of breath with exertion or at rest.  No excess mucus, no productive cough,  No non-productive cough,  No coughing up of blood.  No change in color of mucus.  No wheezing.  No chest wall deformity  Skin: no rash or lesions.  GU: no dysuria, change in color of urine, no urgency or frequency.  No flank pain, no hematuria   MS:  No joint pain or swelling.  No decreased range of motion.  No back pain.    Physical Exam  BP (!) 116/56 (BP Location: Left Arm, Patient Position: Sitting, Cuff Size: Normal)   Pulse 71   Temp (!) 97.5 F (36.4 C) (Oral)   Ht 5\' 2"  (1.575 m)   Wt 131 lb (59.4 kg)   SpO2 97%   BMI 23.96 kg/m   GEN: A/Ox3; pleasant , NAD, well nourished    HEENT:  [place]/AT,  EACs-clear, TMs-wnl, NOSE-clear, THROAT-clear, no lesions, no postnasal drip or exudate noted.   NECK:  Supple w/ fair ROM; no JVD; normal carotid impulses w/o bruits; no thyromegaly or nodules palpated; no lymphadenopathy.    RESP  Clear  P & A; w/o, wheezes/ rales/ or rhonchi. no accessory muscle use, no dullness to percussion  CARD:  RRR, no m/r/g, no peripheral edema, pulses intact, no cyanosis or clubbing.  GI:   Soft & nt; nml bowel sounds; no organomegaly or masses detected.   Musco: Warm bil, no deformities or joint swelling noted.   Neuro: alert, no focal deficits noted.    Skin: Warm, no lesions or rashes    Lab Results:  CBC    Imaging: DG Chest 2 View  Result Date: 04/17/2023 CLINICAL DATA:  Cough. EXAM: CHEST - 2 VIEW COMPARISON:  February 17, 2023. FINDINGS: The heart size and mediastinal contours are within normal limits. Both lungs are clear. The visualized skeletal structures are unremarkable. IMPRESSION: No active cardiopulmonary disease. Electronically Signed   By: Lupita Raider M.D.   On: 04/17/2023 14:01   CT Head Wo Contrast  Result Date:  03/24/2023 CLINICAL DATA:  Headache, new onset (Age >= 51y) EXAM: CT HEAD WITHOUT CONTRAST TECHNIQUE: Contiguous axial images were obtained from the base of the  skull through the vertex without intravenous contrast. RADIATION DOSE REDUCTION: This exam was performed according to the departmental dose-optimization program which includes automated exposure control, adjustment of the mA and/or kV according to patient size and/or use of iterative reconstruction technique. COMPARISON:  None Available. FINDINGS: Brain: There is atrophy and chronic small vessel disease changes. No acute intracranial abnormality. Specifically, no hemorrhage, hydrocephalus, mass lesion, acute infarction, or significant intracranial injury. Vascular: No hyperdense vessel or unexpected calcification. Skull: No acute calvarial abnormality. Sinuses/Orbits: No acute findings Other: None IMPRESSION: Atrophy, chronic microvascular disease. No acute intracranial abnormality. Electronically Signed   By: Charlett Nose M.D.   On: 03/24/2023 23:14   CT Cervical Spine Wo Contrast  Result Date: 03/22/2023 CLINICAL DATA:  Neck pain EXAM: CT CERVICAL SPINE WITHOUT CONTRAST TECHNIQUE: Multidetector CT imaging of the cervical spine was performed without intravenous contrast. Multiplanar CT image reconstructions were also generated. RADIATION DOSE REDUCTION: This exam was performed according to the departmental dose-optimization program which includes automated exposure control, adjustment of the mA and/or kV according to patient size and/or use of iterative reconstruction technique. COMPARISON:  None Available. FINDINGS: Alignment: Mild straightening of the normal cervical lordosis. Trace retrolisthesis of C6 on C7. Skull base and vertebrae: Postsurgical changes from C2-C6 ACDF with corpectomy device spanning the expected location of the C3-C5 vertebral bodies. Hardware is intact. There is no evidence of perihardware lucencies around the transpedicular  screws or the anterior screw and plate fixation device to suggest hardware loosening or complications. There is a chronic fracture through the anterior and posterior arch of C1 (series 3, image 19). There is normal alignment of the C1-C2 articulation. There is also a chronic appearing fracture at the right C5 transverse process (series 3, image 40). Soft tissues and spinal canal: No prevertebral fluid or swelling. No visible canal hematoma. Disc levels: Assessment of the spinal canal is limited due to streak artifact from metallic fusion hardware. Upper chest: Negative. Other: None. IMPRESSION: 1. Postsurgical changes from C2-C6 ACDF with corpectomy device spanning the expected location of the C3-C5 vertebral bodies. Hardware is intact. No evidence of hardware loosening or complications. 2. Chronic appearing fractures of the anterior and posterior arch of C1 and the right C5 transverse process. Recommend correlation with prior imaging. Electronically Signed   By: Lorenza Cambridge M.D.   On: 03/22/2023 15:50   XR Hand Complete Left  Result Date: 03/18/2023 Three-view x-rays left hand show severe erosive changes with carpal bones eroding into the distal radius and ulna with about 50% of the carpal row gone.  Some cystic changes in the metaphyseal region of the distal radius. 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        Latest Ref Rng & Units 11/20/2020   12:36 PM 11/26/2016    9:25 AM  PFT Results  FVC-Pre L 1.91  1.79  P  FVC-Predicted Pre % 71  65  P  FVC-Post L  1.81  P  FVC-Predicted Post %  65  P  Pre FEV1/FVC % % 81  81  P  Post FEV1/FCV % %  82  P  FEV1-Pre L 1.55  1.46  P  FEV1-Predicted Pre % 80  73  P  FEV1-Post L  1.48  P  DLCO uncorrected ml/min/mmHg 11.83  12.16  P  DLCO UNC% % 60  53  P  DLCO corrected ml/min/mmHg 12.43  12.31  P  DLCO COR %Predicted % 63  53  P  DLVA Predicted %  99  98  P  TLC L  3.64  P  TLC % Predicted %  64  P  RV % Predicted %   86  P    P Preliminary result    No results found for: "NITRICOXIDE"      Assessment & Plan:   COPD (chronic obstructive pulmonary disease) (HCC) Recent COPD exacerbation with possible superimposed pneumonia.  Now resolved after antibiotics and steroids.  Chest x-ray is clear.  Continue current regimen.  Plan  Patient Instructions  Mucinex DM Twice daily  As needed  cough/congestion  Continue on BEVESPI 2 puffs Twice daily Albuterol inhaler As needed   Activity as tolerated  Follow up with Dr. Marchelle Gearing in 6 months and As needed   Please contact office for sooner follow up if symptoms do not improve or worsen or seek emergency care       Rubye Oaks, NP 04/17/2023

## 2023-04-17 NOTE — Assessment & Plan Note (Signed)
Recent COPD exacerbation with possible superimposed pneumonia.  Now resolved after antibiotics and steroids.  Chest x-ray is clear.  Continue current regimen.  Plan  Patient Instructions  Mucinex DM Twice daily  As needed  cough/congestion  Continue on BEVESPI 2 puffs Twice daily Albuterol inhaler As needed   Activity as tolerated  Follow up with Dr. Marchelle Gearing in 6 months and As needed   Please contact office for sooner follow up if symptoms do not improve or worsen or seek emergency care

## 2023-04-29 ENCOUNTER — Inpatient Hospital Stay: Payer: Medicare HMO | Attending: Oncology

## 2023-04-29 DIAGNOSIS — D508 Other iron deficiency anemias: Secondary | ICD-10-CM

## 2023-04-29 DIAGNOSIS — D509 Iron deficiency anemia, unspecified: Secondary | ICD-10-CM | POA: Diagnosis present

## 2023-04-29 LAB — CBC WITH DIFFERENTIAL (CANCER CENTER ONLY)
Abs Immature Granulocytes: 0.01 10*3/uL (ref 0.00–0.07)
Basophils Absolute: 0 10*3/uL (ref 0.0–0.1)
Basophils Relative: 1 %
Eosinophils Absolute: 0.1 10*3/uL (ref 0.0–0.5)
Eosinophils Relative: 3 %
HCT: 34 % — ABNORMAL LOW (ref 39.0–52.0)
Hemoglobin: 11.1 g/dL — ABNORMAL LOW (ref 13.0–17.0)
Immature Granulocytes: 0 %
Lymphocytes Relative: 23 %
Lymphs Abs: 0.9 10*3/uL (ref 0.7–4.0)
MCH: 27.8 pg (ref 26.0–34.0)
MCHC: 32.6 g/dL (ref 30.0–36.0)
MCV: 85.2 fL (ref 80.0–100.0)
Monocytes Absolute: 0.7 10*3/uL (ref 0.1–1.0)
Monocytes Relative: 18 %
Neutro Abs: 2.2 10*3/uL (ref 1.7–7.7)
Neutrophils Relative %: 55 %
Platelet Count: 265 10*3/uL (ref 150–400)
RBC: 3.99 MIL/uL — ABNORMAL LOW (ref 4.22–5.81)
RDW: 14.9 % (ref 11.5–15.5)
WBC Count: 4 10*3/uL (ref 4.0–10.5)
nRBC: 0 % (ref 0.0–0.2)

## 2023-04-29 LAB — FERRITIN: Ferritin: 22 ng/mL — ABNORMAL LOW (ref 24–336)

## 2023-04-30 ENCOUNTER — Telehealth: Payer: Self-pay | Admitting: *Deleted

## 2023-04-30 NOTE — Telephone Encounter (Signed)
-----   Message from Ladene Artist, MD sent at 04/29/2023  8:16 PM EDT ----- Please call patient, the hemoglobin and iron level are mildly low, be sure he is taking iron as prescribed Repeat CBC and ferritin with an office visit in 1 month

## 2023-04-30 NOTE — Telephone Encounter (Signed)
Informed Tony Mcmillan that his Hgb and iron is low. He has only been taking his iron daily, but agrees to start taking twice daily. Will schedule him for lab/OV in 1 month. Scheduling message sent.

## 2023-05-01 ENCOUNTER — Telehealth: Payer: Self-pay | Admitting: Oncology

## 2023-05-13 ENCOUNTER — Ambulatory Visit (INDEPENDENT_AMBULATORY_CARE_PROVIDER_SITE_OTHER): Payer: Medicare HMO | Admitting: Nurse Practitioner

## 2023-05-13 ENCOUNTER — Encounter: Payer: Self-pay | Admitting: Nurse Practitioner

## 2023-05-13 VITALS — BP 108/62 | HR 71 | Wt 126.8 lb

## 2023-05-13 DIAGNOSIS — M5481 Occipital neuralgia: Secondary | ICD-10-CM | POA: Diagnosis not present

## 2023-05-13 MED ORDER — GABAPENTIN 100 MG PO CAPS: ORAL_CAPSULE | ORAL | 3 refills | Status: DC

## 2023-05-13 NOTE — Patient Instructions (Signed)
Occipital Neuralgia  Occipital neuralgia is a type of headache that causes brief episodes of very bad pain in the back of the head. Pain from occipital neuralgia may spread (radiate) to other parts of the head. These headaches may be caused by irritation of the nerves that leave the spinal cord high up in the neck, just below the base of the skull (occipital nerves). The occipital nerves transmit sensations from the back of the head, the top of the head, and the areas behind the ears. What are the causes? This condition can occur without any known cause (primary headache syndrome). In other cases, this condition is caused by pressure on or irritation of one of the two occipital nerves. Pressure and irritation may be due to: Muscle spasm in the neck. Neck injury. Wear and tear of the vertebrae in the neck (osteoarthritis). Disease of the disks that separate the vertebrae. Swollen blood vessels that put pressure on the occipital nerves. Infections. Tumors. Diabetes. What are the signs or symptoms? This condition causes brief burning, stabbing, electric, shocking, or shooting pain in the back of the head that can radiate to the top of the head. It can happen on one side or both sides of the head. It can also cause: Pain behind the eye. Pain triggered by neck movement or hair brushing. Scalp tenderness. Aching in the back of the head between episodes of very bad pain. Pain that gets worse with exposure to bright lights. How is this diagnosed? Your health care provider may diagnose the condition based on a physical exam and your symptoms. Tests may be done, such as: Imaging studies of the brain and neck (cervical spine), such as an MRI or CT scan. These look for causes of pinched nerves. Applying pressure to the nerves in the neck to try to re-create the pain. Injection of numbing medicine into the occipital nerve areas to see if pain goes away (diagnostic nerve block). How is this  treated? Treatment for this condition may begin with simple measures, such as: Rest. Massage. Applying heat or cold to the area. Over-the-counter pain relievers. If these measures do not work, you may need other treatments, including: Medicines, such as: Prescription-strength anti-inflammatory medicines. Muscle relaxants. Anti-seizure medicines, which can relieve pain. Antidepressants, which can relieve pain. Injected medicines, such as medicines that numb the area (local anesthetic) and steroids. Pulsed radiofrequency ablation. This is when wires are implanted to deliver electrical impulses that block pain signals from the occipital nerve. Surgery to relieve nerve pressure. Physical therapy. Follow these instructions at home: Managing pain     Avoid any activities that cause pain. Rest when you have an attack of pain. Try gentle massage to relieve pain. Try a different pillow or sleeping position. If directed, apply heat to the affected area as often as told by your health care provider. Use the heat source that your health care provider recommends, such as a moist heat pack or a heating pad. Place a towel between your skin and the heat source. Leave the heat on for 20-30 minutes. Remove the heat if your skin turns bright red. This is especially important if you are unable to feel pain, heat, or cold. You have a greater risk of getting burned. If directed, put ice on the back of your head and neck area. To do this: Put ice in a plastic bag. Place a towel between your skin and the bag. Leave the ice on for 20 minutes, 2-3 times a day. Remove the ice   if your skin turns bright red. This is very important. If you cannot feel pain, heat, or cold, you have a greater risk of damage to the area. General instructions Take over-the-counter and prescription medicines only as told by your health care provider. Avoid things that make your symptoms worse, such as bright lights. Try to stay  active. Get regular exercise that does not cause pain. Ask your health care provider to suggest safe exercises for you. Work with a physical therapist to learn stretching exercises you can do at home. Practice good posture. Keep all follow-up visits. This is important. Contact a health care provider if: Your medicine is not working. You have new or worsening symptoms. Get help right away if: You have very bad head pain that does not go away. You have a sudden change in vision, balance, or speech. These symptoms may represent a serious problem that is an emergency. Do not wait to see if the symptoms will go away. Get medical help right away. Call your local emergency services (911 in the [place] Do not drive yourself to the hospital. Summary Occipital neuralgia is a type of headache that causes brief episodes of very bad pain in the back of the head. Pain from occipital neuralgia may spread (radiate) to other parts of the head. Treatment for this condition includes rest, massage, and medicines. This information is not intended to replace advice given to you by your health care provider. Make sure you discuss any questions you have with your health care provider. Document Revised: 10/08/2020 Document Reviewed: 10/08/2020 Elsevier Patient Education  2023 [employer] Inc.  

## 2023-05-13 NOTE — Progress Notes (Signed)
Tollie Eth, DNP, AGNP-c [facility] [address] [place], [place] [address] [phone]  Subjective:   Tony Mcmillan is a 82 y.o. male presents to day for evaluation of: Neck Pain Tadao presents today with pain in his neck extending up the left side of his head, which worsens when he moves his neck or uses his jaw. He describes the pain as sharp and shooting.  He reports that his arthritis specialist referred him to a spine clinic due to a suspected spinal bruise, possibly lumbar, and mentioned potential treatments including an epidural and diagnostic cervical facet injection for consideration of radiofrequency ablation. He has been waiting nearly three weeks for approval from the spine clinic.  The patient is not currently taking naproxen or any nerve medications like gabapentin. He does not recall any scheduled follow-up appointments with his specialists and has not received any communication from the spine clinic regarding next steps. The office where he has been seen for his back is [facility].  PMH, Medications, and Allergies reviewed and updated in chart as appropriate.   ROS negative except for what is listed in HPI. Objective:  BP 108/62   Pulse 71   Wt 126 lb 12.8 oz (57.5 kg)   SpO2 98%   BMI 23.19 kg/m  Physical Exam Vitals and nursing note reviewed.  Constitutional:      General: He is not in acute distress.    Appearance: Normal appearance. He is normal weight.  HENT:     Head: Normocephalic.  Eyes:     General: No scleral icterus.    Conjunctiva/sclera: Conjunctivae normal.  Neck:     Vascular: Carotid bruit present.     Comments: Chronic bruits present Cardiovascular:     Rate and Rhythm: Normal rate and regular rhythm.     Pulses: Normal pulses.     Heart sounds: Normal heart sounds.  Pulmonary:     Effort: Pulmonary effort is normal.     Breath sounds: Normal breath sounds.  Musculoskeletal:     Cervical back: Tenderness  present. No edema, signs of trauma, rigidity or crepitus. Pain with movement and muscular tenderness present. Decreased range of motion.  Lymphadenopathy:     Cervical: No cervical adenopathy.  Skin:    General: Skin is warm and dry.     Capillary Refill: Capillary refill takes less than 2 seconds.  Neurological:     General: No focal deficit present.     Mental Status: He is alert.     Sensory: No sensory deficit.     Motor: No weakness.  Psychiatric:        Mood and Affect: Mood normal.           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  Plan: - Prescribe gabapentin for nerve pain. Start with one capsule two times during the day and one to three capsules at bedtime as needed. - Monitor for effectiveness and adjust dosage if necessary. - Follow-up with spinal specialist as this could be related to the lower back pain.       Relevant Medications   gabapentin (NEURONTIN) 100 MG capsule      Tollie Eth, DNP, AGNP-c 06/04/2023  10:42 PM    History, Medications, Surgery, SDOH, and Family  History reviewed and updated as appropriate.

## 2023-05-14 ENCOUNTER — Ambulatory Visit: Payer: Medicare HMO | Admitting: Family Medicine

## 2023-05-23 ENCOUNTER — Telehealth: Payer: Self-pay | Admitting: Nurse Practitioner

## 2023-05-23 NOTE — Telephone Encounter (Signed)
Tony Mcmillan called and states he is still having head and neck pain and he is asking if he can be referred to a specialist?

## 2023-05-27 ENCOUNTER — Other Ambulatory Visit: Payer: Self-pay

## 2023-05-27 DIAGNOSIS — M5481 Occipital neuralgia: Secondary | ICD-10-CM

## 2023-06-02 ENCOUNTER — Encounter: Payer: Self-pay | Admitting: Nurse Practitioner

## 2023-06-02 ENCOUNTER — Telehealth: Payer: Self-pay

## 2023-06-02 ENCOUNTER — Inpatient Hospital Stay: Payer: Medicare HMO | Admitting: Nurse Practitioner

## 2023-06-02 ENCOUNTER — Inpatient Hospital Stay: Payer: Medicare HMO | Attending: Oncology

## 2023-06-02 VITALS — BP 112/58 | HR 66 | Temp 98.1°F | Resp 18 | Ht 62.0 in | Wt 128.7 lb

## 2023-06-02 DIAGNOSIS — D509 Iron deficiency anemia, unspecified: Secondary | ICD-10-CM | POA: Diagnosis present

## 2023-06-02 DIAGNOSIS — D508 Other iron deficiency anemias: Secondary | ICD-10-CM

## 2023-06-02 DIAGNOSIS — D500 Iron deficiency anemia secondary to blood loss (chronic): Secondary | ICD-10-CM

## 2023-06-02 LAB — CBC WITH DIFFERENTIAL (CANCER CENTER ONLY)
Abs Immature Granulocytes: 0 10*3/uL (ref 0.00–0.07)
Basophils Absolute: 0 10*3/uL (ref 0.0–0.1)
Basophils Relative: 1 %
Eosinophils Absolute: 0.1 10*3/uL (ref 0.0–0.5)
Eosinophils Relative: 2 %
HCT: 33 % — ABNORMAL LOW (ref 39.0–52.0)
Hemoglobin: 10.7 g/dL — ABNORMAL LOW (ref 13.0–17.0)
Immature Granulocytes: 0 %
Lymphocytes Relative: 19 %
Lymphs Abs: 0.7 10*3/uL (ref 0.7–4.0)
MCH: 27.6 pg (ref 26.0–34.0)
MCHC: 32.4 g/dL (ref 30.0–36.0)
MCV: 85.3 fL (ref 80.0–100.0)
Monocytes Absolute: 0.7 10*3/uL (ref 0.1–1.0)
Monocytes Relative: 18 %
Neutro Abs: 2.3 10*3/uL (ref 1.7–7.7)
Neutrophils Relative %: 60 %
Platelet Count: 279 10*3/uL (ref 150–400)
RBC: 3.87 MIL/uL — ABNORMAL LOW (ref 4.22–5.81)
RDW: 14.9 % (ref 11.5–15.5)
WBC Count: 3.8 10*3/uL — ABNORMAL LOW (ref 4.0–10.5)
nRBC: 0 % (ref 0.0–0.2)

## 2023-06-02 LAB — FERRITIN: Ferritin: 28 ng/mL (ref 24–336)

## 2023-06-02 NOTE — Telephone Encounter (Signed)
I contacted [facility] and left a message for Madelaine Bhat, requesting that he reach out to a patient who has reported experiencing pain in their head. The patient previously attempted to contact Huntley Dec Early without receiving a response. I also provided Adam with the contact information for my office in case he has any questions.

## 2023-06-02 NOTE — Progress Notes (Signed)
  [facility] OFFICE PROGRESS NOTE   Diagnosis: Iron deficiency  INTERVAL HISTORY:   Tony Mcmillan returns for follow-up.  Labs done 04/29/2023 showed a mild decline in the hemoglobin, stable ferritin at 22.  Oral iron recommended twice daily.  He reports overall good compliance with oral iron twice daily.  He has mild intermittent constipation which is relieved with a laxative.  Energy level described as "so-so".  He has mild dyspnea on exertion.  He is not aware of any bleeding.  Main complaint is head/neck pain.  Objective:  Vital signs in last 24 hours:  Blood pressure (!) 112/58, pulse 66, temperature 98.1 F (36.7 C), temperature source Oral, resp. rate 18, height 5\' 2"  (1.575 m), weight 128 lb 11.2 oz (58.4 kg), SpO2 100 %.    Resp: Lungs clear bilaterally.  Breath sounds diminished right lower lung field.  No respiratory distress. Cardio: Regular rate and rhythm. GI: No hepatosplenomegaly. Vascular: No leg edema.   Lab Results:  Lab Results  Component Value Date   WBC 3.8 (L) 06/02/2023   HGB 10.7 (L) 06/02/2023   HCT 33.0 (L) 06/02/2023   MCV 85.3 06/02/2023   PLT 279 06/02/2023   NEUTROABS 2.3 06/02/2023    Imaging:  No results found.  Medications: I have reviewed the patient's current medications.  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  CBC today shows progressive anemia despite oral iron therapy.  We will follow-up on the ferritin.  We discussed a trial of IV iron.  He understands the risk of an allergic reaction and agrees to proceed.  He will return for Feraheme 510 mg IV weekly x 2 beginning 06/12/2023.  We will see him in follow-up in 1 month.    Tony Mcmillan ANP/GNP-BC   06/02/2023  2:04 PM

## 2023-06-04 ENCOUNTER — Encounter: Payer: Self-pay | Admitting: Nurse Practitioner

## 2023-06-04 NOTE — Assessment & Plan Note (Signed)
Patient is experiencing cervicalgia (neck pain) with associated headaches. 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  Plan: - Prescribe gabapentin for nerve pain. Start with one capsule two times during the day and one to three capsules at bedtime as needed. - Monitor for effectiveness and adjust dosage if necessary. - Follow-up with spinal specialist as this could be related to the lower back pain.

## 2023-06-12 ENCOUNTER — Inpatient Hospital Stay: Payer: Medicare HMO

## 2023-06-12 VITALS — BP 124/58 | HR 71 | Temp 98.1°F | Resp 18

## 2023-06-12 DIAGNOSIS — D500 Iron deficiency anemia secondary to blood loss (chronic): Secondary | ICD-10-CM

## 2023-06-12 DIAGNOSIS — D509 Iron deficiency anemia, unspecified: Secondary | ICD-10-CM | POA: Diagnosis not present

## 2023-06-12 MED ORDER — SODIUM CHLORIDE 0.9 % IV SOLN
300.0000 mg | Freq: Once | INTRAVENOUS | Status: AC
  Administered 2023-06-12: 300 mg via INTRAVENOUS
  Filled 2023-06-12: qty 300

## 2023-06-12 MED ORDER — SODIUM CHLORIDE 0.9 % IV SOLN: Freq: Once | INTRAVENOUS | Status: AC

## 2023-06-12 NOTE — Patient Instructions (Signed)

## 2023-06-16 ENCOUNTER — Encounter: Payer: Self-pay | Admitting: Neurology

## 2023-06-16 ENCOUNTER — Ambulatory Visit: Payer: Medicare HMO | Admitting: Neurology

## 2023-06-16 VITALS — BP 90/60 | HR 60 | Ht 62.0 in | Wt 127.0 lb

## 2023-06-16 DIAGNOSIS — M542 Cervicalgia: Secondary | ICD-10-CM | POA: Diagnosis not present

## 2023-06-16 DIAGNOSIS — R200 Anesthesia of skin: Secondary | ICD-10-CM

## 2023-06-16 DIAGNOSIS — M79642 Pain in left hand: Secondary | ICD-10-CM

## 2023-06-16 DIAGNOSIS — M5412 Radiculopathy, cervical region: Secondary | ICD-10-CM

## 2023-06-16 DIAGNOSIS — R29898 Other symptoms and signs involving the musculoskeletal system: Secondary | ICD-10-CM

## 2023-06-16 DIAGNOSIS — W19XXXA Unspecified fall, initial encounter: Secondary | ICD-10-CM | POA: Diagnosis not present

## 2023-06-16 DIAGNOSIS — M069 Rheumatoid arthritis, unspecified: Secondary | ICD-10-CM

## 2023-06-16 DIAGNOSIS — Z9189 Other specified personal risk factors, not elsewhere classified: Secondary | ICD-10-CM

## 2023-06-16 MED ORDER — LIDOCAINE HCL 2 % IJ SOLN
1.0000 mL | Freq: Once | INTRAMUSCULAR | Status: DC
Start: 2023-06-16 — End: 2024-02-18

## 2023-06-16 MED ORDER — METHYLPREDNISOLONE ACETATE 80 MG/ML IJ SUSP
80.0000 mg | Freq: Once | INTRAMUSCULAR | Status: DC
Start: 2023-06-16 — End: 2023-08-23

## 2023-06-16 MED ORDER — METHYLPREDNISOLONE 4 MG PO TBPK
ORAL_TABLET | ORAL | 1 refills | Status: DC
Start: 2023-06-16 — End: 2023-07-02

## 2023-06-16 MED ORDER — BUPIVACAINE HCL 0.5 % IJ SOLN
4.0000 mL | Freq: Once | INTRAMUSCULAR | Status: DC
Start: 2023-06-16 — End: 2024-02-18

## 2023-06-16 NOTE — Progress Notes (Unsigned)
Nerve block with steroid: Pt signed consent yes   0.5% Bupivocaine  4 mL LOT: [id number] EXP: 07/2025 NDC: 54098-119-14  2% Lidocaine  1 mL LOT: [id number] EXP: 06/2025 NDC: 30865-784-69  Witnessed by Elana Alm    Methylprednisolone : 80mg /ml LOT: [id number] EXP:04/2024 NDC:[address]-1

## 2023-06-16 NOTE — Progress Notes (Signed)
[facility]    Provider:  Dr Lucia Gaskins Requesting Provider: Early, Sung Amabile, NP Primary Care Provider:  Tollie Eth, NP  CC:  occipital neuralgia  HPI:  Tony Mcmillan is a 82 y.o. male here as requested by Early, Sung Amabile, NP for occipital neuralgia. has Duodenal ulcer; Asymptomatic microscopic hematuria; Benign prostatic hyperplasia; Renal cyst, left; Osteoarthritis of lumbar spine; DDD (degenerative disc disease), lumbar; Rheumatoid arthritis (HCC); High risk medication use; DJD (degenerative joint disease), cervical; Vitamin D deficiency; Pulmonary emphysema (HCC); History of systemic steroid therapy; Bilateral carotid bruits; Weight loss; Enlarged prostate; Aortic valve calcification; Bladder wall thickening; CAD (coronary artery disease); Premature atrial contractions; Mixed hyperlipidemia; Other long term (current) drug therapy; Pulmonary fibrosis (HCC); Absolute anemia; COPD (chronic obstructive pulmonary disease) (HCC); Olecranon bursitis of right elbow; Iron deficiency anemia due to chronic blood loss; and Occipital neuralgia of left side on their problem list. He has pain in his neck and radiates on the left to his head. 2015 he had a spine operation. He has only occipital neuralgia a few months/ He had a fall and that's when it started. Here with his daughter who provides most information. He can't turn his head to the right of it shoits up. Incetween there is tenderness. Turning both ways makes it shoot, pain severe, brief with pain inbetween. He has sensoty changes radiating into left arm. Acute arm weakness and numbness. All happened after a fall. He saw the pain center and they never did get back to him about injections into his spine, he has been treated by his pcp, progressive, painful, decreased ROM. Left hand hurts. No other focal neurologic deficits, associated symptoms, inciting events or modifiable factors.   Reviewed notes, labs and imaging from outside physicians,  which showed:     Latest Ref Rng & Units 06/02/2023    1:14 PM 04/29/2023   11:45 AM 01/28/2023   11:30 AM  CBC  WBC 4.0 - 10.5 K/uL 3.8  4.0  5.4   Hemoglobin 13.0 - 17.0 g/dL 24.4  01.0  27.2   Hematocrit 39.0 - 52.0 % 33.0  34.0  37.1   Platelets 150 - 400 K/uL 279  265  314       Latest Ref Rng & Units 07/03/2022    9:14 AM 05/09/2022   11:41 AM 01/06/2022   11:49 AM  CMP  Glucose 70 - 99 mg/dL 98  536  644   BUN 8 - 27 mg/dL 13  17  15    Creatinine 0.76 - 1.27 mg/dL 0.34  7.42  5.95   Sodium 134 - 144 mmol/L 138  138  138   Potassium 3.5 - 5.2 mmol/L 4.6  4.7  4.4   Chloride 96 - 106 mmol/L 103  102  104   CO2 20 - 29 mmol/L 24  23  25    Calcium 8.6 - 10.2 mg/dL 9.4  9.1  9.5   Total Protein 6.0 - 8.5 g/dL 6.9  7.2  7.6   Total Bilirubin 0.0 - 1.2 mg/dL 0.2  0.3  0.8   Alkaline Phos 44 - 121 IU/L 61  65  55   AST 0 - 40 IU/L 20  20  21    ALT 0 - 44 IU/L 20  14  15      03/2022: CLINICAL DATA:  Neck pain   EXAM: CT CERVICAL SPINE WITHOUT CONTRAST   TECHNIQUE: Multidetector CT imaging of the cervical spine was performed without intravenous contrast. Multiplanar CT  image reconstructions were also generated.   RADIATION DOSE REDUCTION: This exam was performed according to the departmental dose-optimization program which includes automated exposure control, adjustment of the mA and/or kV according to patient size and/or use of iterative reconstruction technique.   COMPARISON:  None Available.   FINDINGS: Alignment: Mild straightening of the normal cervical lordosis. Trace retrolisthesis of C6 on C7.   Skull base and vertebrae: Postsurgical changes from C2-C6 ACDF with corpectomy device spanning the expected location of the C3-C5 vertebral bodies. Hardware is intact. There is no evidence of perihardware lucencies around the transpedicular screws or the anterior screw and plate fixation device to suggest hardware loosening or complications.   There is a chronic  fracture through the anterior and posterior arch of C1 (series 3, image 19). There is normal alignment of the C1-C2 articulation.   There is also a chronic appearing fracture at the right C5 transverse process (series 3, image 40).   Soft tissues and spinal canal: No prevertebral fluid or swelling. No visible canal hematoma.   Disc levels: Assessment of the spinal canal is limited due to streak artifact from metallic fusion hardware.   Upper chest: Negative.   Other: None.   IMPRESSION: 1. Postsurgical changes from C2-C6 ACDF with corpectomy device spanning the expected location of the C3-C5 vertebral bodies. Hardware is intact. No evidence of hardware loosening or complications. 2. Chronic appearing fractures of the anterior and posterior arch of C1 and the right C5 transverse process. Recommend correlation with prior imaging.  Reviewed: Dr. Bosie Helper notes   Patient is experiencing cervicalgia (neck pain) with associated headaches. 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  Plan: - Prescribe gabapentin for nerve pain. Start with one capsule two times during the day and one to three capsules at bedtime as needed. - Monitor for effectiveness and adjust dosage if necessary. - Follow-up with spinal specialist as this could be related to the lower back pain.    Review of Systems: Patient complains of symptoms per HPI as well as the following symptoms neck pain. Pertinent negatives and positives per HPI. All others negative.   Social History   Socioeconomic History   Marital status: Widowed    Spouse name: Not on file   Number of children: Not on file   Years of education: Not on file   Highest education level: Not on file  Occupational History   Not on file  Tobacco Use   Smoking status: Former    Packs/day: 1.00    Years: 17.00    Additional pack years: 0.00    Total pack years: 17.00    Types: Cigarettes    Quit date: 12/23/1993     Years since quitting: 29.5   Smokeless tobacco: Never  Vaping Use   Vaping Use: Never used  Substance and Sexual Activity   Alcohol use: No    Alcohol/week: 0.0 standard drinks of alcohol   Drug use: No   Sexual activity: Yes  Other Topics Concern   Not on file  Social History Narrative   Retired. Lives alone.    Social Determinants of Health   Financial Resource Strain: Low Risk  (12/13/2022)   Overall Financial Resource Strain (CARDIA)    Difficulty of Paying Living Expenses: Not hard at all  Food Insecurity: No Food Insecurity (12/13/2022)   Hunger Vital Sign    Worried About Running Out of Food in the Last Year: Never true    Ran Out of Food in  the Last Year: Never true  Transportation Needs: No Transportation Needs (12/13/2022)   PRAPARE - Administrator, Civil Service (Medical): No    Lack of Transportation (Non-Medical): No  Physical Activity: Inactive (12/13/2022)   Exercise Vital Sign    Days of Exercise per Week: 0 days    Minutes of Exercise per Session: 0 min  Stress: No Stress Concern Present (12/13/2022)   [employer] of Occupational Health - Occupational Stress Questionnaire    Feeling of Stress : Not at all  Social Connections: Not on file  Intimate Partner Violence: Not on file    Family History  Problem Relation Age of Onset   Hypertension Mother    Hypertension Father    Diabetes Father    Heart disease Father        stents   Hyperlipidemia Sister    Diabetes Sister    Stroke Brother    Diabetes Brother    Heart disease Brother        stents   Colon polyps Neg Hx    Esophageal cancer Neg Hx    Stomach cancer Neg Hx    Rectal cancer Neg Hx    Colon cancer Neg Hx     Past Medical History:  Diagnosis Date   3-vessel coronary artery disease 03/10/2020   Allergy    Anemia    Aortic valve calcification 03/10/2020   Arthritis    Asymptomatic microscopic hematuria 04/03/2016   Cystoscopy 02/207 Dr. [facility]ryl [facility].      Atypical chest pain    a. 09/2016 MV: EF 63%, normal perfusion.   Benign prostatic hyperplasia 04/03/2016   Asymptomatic. Normal PSA and exam by Dr. Sherryl [facility] 01/2016.     Bilateral carotid bruits 02/09/2019   Bladder wall thickening 03/10/2020   CAD (coronary artery disease)    Coronary artery calcification on Chest CT in 09/2016 and 01/2019 // Myoview 10/17: normal perfusion // Myoview 01/2019: no ischemia or infarction, EF 58 // Coronary CTA 03/2020: Calcium Score 1067; non-obstructive CAD [oLM 1-24, LAD ost 25049, mid and dist 1-24; D1 1-24; LCx prox and mid 1-24, OM1 and OM2 25-49, RCA and PLB 1-24; PLA 1-24]    Carotid bruit    Carotid Dopplers 01/2019: no ICA stenosis bilaterally    Chronic wrist pain 07/02/2016   Colonic polyp 02/23/2016   COVID-19    DDD (degenerative disc disease), 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 L4-5 and L5-S1    Diastolic dysfunction    a. 2015 Echo: EF >55%;  b. 09/2016 Echo: EF 55-60%, no rwma, Gr1 DD, triv AI/MR, mild TR, PASP .   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   On CT 01/2016    Palpitations    Echo 10/17: Mild focal basal septal hypertrophy, EF 55-60, no RWMA, Gr 1 DD, trivial MR, normal RVSF, mild TR, PASP 28 // Event monitor 01/2019: NSR, PACs   Prediabetes 10/29/2018   Pulmonary emphysema (HCC) 12/18/2016   Renal cyst, left 04/03/2016  On CT, benign    Rheumatoid arthritis (HCC)    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    -RF,-CCP,-[id number], erosive disease with contractures   Tubular adenoma of colon 01/2016   Vitamin D deficiency 11/04/2016    Patient Active Problem List   Diagnosis Date Noted   Iron deficiency anemia due to chronic blood loss 06/02/2023   Occipital neuralgia of left side 03/26/2023   COPD (chronic obstructive pulmonary disease) (HCC) 08/08/2022   Olecranon bursitis of right elbow 05/24/2022   Other long term (current) drug therapy 05/09/2022   Pulmonary fibrosis (HCC) 05/09/2022   Absolute anemia 05/09/2022   Mixed hyperlipidemia 01/21/2022   Premature atrial contractions 01/20/2022   CAD (coronary artery disease)    Enlarged prostate 03/10/2020   Aortic valve calcification 03/10/2020   Bladder wall thickening 03/10/2020   Weight loss 11/15/2019   Bilateral carotid bruits 02/09/2019   History of systemic steroid therapy 10/30/2017   Pulmonary emphysema (HCC) 12/18/2016   Rheumatoid arthritis (HCC) 11/04/2016   High risk medication use 11/04/2016   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   Vitamin D deficiency 11/04/2016   Asymptomatic microscopic hematuria 04/03/2016   Benign prostatic hyperplasia 04/03/2016   Renal cyst, left 04/03/2016   Osteoarthritis of lumbar spine 04/03/2016   DDD (degenerative disc disease), lumbar 04/03/2016   Duodenal ulcer 02/23/2016    Past Surgical History:  Procedure Laterality Date   ANTERIOR CERVICAL CORPECTOMY     ANTERIOR CERVICAL CORPECTOMY  12/2014   for infection. this was in [place]   CATARACT EXTRACTION     both eyes   COLONOSCOPY     COLOSTOMY REVERSAL     had infection on buttocks and had to have a skin graft- had colostomy to help area stay clean and heal   HERNIA REPAIR     POLYPECTOMY     SPINE SURGERY      Current Outpatient Medications  Medication Sig Dispense Refill   acetaminophen (TYLENOL) 500 MG tablet Take 1 tablet (500 mg total) by mouth every 6 (six) hours as needed. 30 tablet 0   albuterol (VENTOLIN  HFA) 108 (90 Base) MCG/ACT inhaler Inhale 2 puffs into the lungs every 6 (six) hours as needed for wheezing or shortness of breath. 3 each 1   aspirin EC 81 MG tablet Take 1 tablet (81 mg total) by mouth daily. Swallow whole. 30 tablet 12   atorvastatin (LIPITOR) 40 MG tablet TAKE 1 TABLET BY MOUTH EVERY DAY 90 tablet 3   Calcium Carb-Cholecalciferol (CALCIUM-VITAMIN D3) 600-400 MG-UNIT TABS Take 1 tablet by mouth 2 (two) times daily.     docusate sodium (COLACE) 250 MG capsule Take 250 mg by mouth daily.     ferrous sulfate 325 (65 FE) MG EC tablet Take 1 tablet (325 mg total) by mouth 2 (two) times daily.     gabapentin (NEURONTIN) 100 MG capsule Take 1 capsule by mouth up to two times as needed during the day. Take 1-3 capsules by mouth at bedtime. For neck pain/headache. 90 capsule 3   Glycopyrrolate-Formoterol (BEVESPI AEROSPHERE) 9-4.8 MCG/ACT AERO Inhale 2 puffs into the lungs in the morning and at bedtime. 3 each 1   isosorbide mononitrate (IMDUR) 30 MG 24 hr tablet TAKE 1/2 OF A TABLET (15 MG TOTAL) BY MOUTH DAILY 45 tablet 1   latanoprost (XALATAN) 0.005 % ophthalmic solution      leflunomide (ARAVA) 20 MG tablet Take 20 mg by mouth daily.     methylPREDNISolone (  MEDROL DOSEPAK) 4 MG TBPK tablet Take pills daily all together with food. Take the first dose (6 pills) as soon as possible. Take the rest each morning. For 6 days total 6-5-4-3-2-1. 21 tablet 1   metoprolol succinate (TOPROL-XL) 25 MG 24 hr tablet Take 1 tablet (25 mg total) by mouth daily. 90 tablet 2   MYRBETRIQ 50 MG TB24 tablet Take 50 mg by mouth daily.      naproxen (NAPROSYN) 375 MG tablet Take 1 tablet (375 mg total) by mouth 2 (two) times daily. 30 tablet 0   nitroGLYCERIN (NITROSTAT) 0.4 MG SL tablet PLACE 1 TABLET UNDER THE TONGUE EVERY 5 MINUTES AS NEEDED FOR CHEST PAIN. 75 tablet 2   pantoprazole (PROTONIX) 40 MG tablet Take 1 tablet (40 mg total) by mouth daily. Please schedule a yearly follow up for further refills.  Thank you 30 tablet 0   polyethylene glycol (MIRALAX) 17 g packet Take 17 g by mouth daily as needed. 14 each 0   REMICADE 100 MG injection      Current Facility-Administered Medications  Medication Dose Route Frequency Provider Last Rate Last Admin   bupivacaine (MARCAINE) 0.5 % (with pres) injection 4 mL  4 mL Infiltration Once Anson Fret, MD       lidocaine (XYLOCAINE) 2 % (with pres) injection 20 mg  1 mL Intradermal Once Anson Fret, MD       methylPREDNISolone acetate (DEPO-MEDROL) injection 80 mg  80 mg Intramuscular Once Anson Fret, MD        Allergies as of 06/16/2023   (No Known Allergies)    Vitals: BP 90/60   Pulse 60   Ht 5\' 2"  (1.575 m)   Wt 127 lb (57.6 kg)   BMI 23.23 kg/m  Last Weight:  Wt Readings from Last 1 Encounters:  06/16/23 127 lb (57.6 kg)   Last Height:   Ht Readings from Last 1 Encounters:  06/16/23 5\' 2"  (1.575 m)     Physical exam: Exam: Gen: NAD, conversant, well nourised, thin, well groomed                     CV: RRR, no MRG. No Carotid Bruits. No peripheral edema, warm, nontender Eyes: Conjunctivae clear without exudates or hemorrhage  Neuro: Detailed Neurologic Exam  Speech:    Speech is normal; fluent and spontaneous with normal comprehension.  Cognition:    The patient is oriented to person, place, and time;     recent and remote memory intact;     language fluent;     normal attention, concentration,     fund of knowledge Cranial Nerves:    The pupils are equal, round, and reactive to light. Pupils too small to visualize. Visual fields are full to finger confrontation. Extraocular movements are intact. Trigeminal sensation is intact and the muscles of mastication are normal. The face is symmetric. The palate elevates in the midline. Hearing intact. Voice is normal. Shoulder shrug is normal. The tongue has normal motion without fasciculations.   Coordination:    Bradykinetic but intact ftn, hts  Gait: Cane,  low clearance, short steps, +arm swing,   Motor Observation:    No asymmetry, no atrophy, and no involuntary movements noted. Tone:    Normal muscle tone.    Posture:    Posture is normal. normal erect    Strength: left arm weakness yriceps, deltois, biceps otherwise strength is equal and symmetric without other foca weakness  in  the upper and lower limbs.      Sensation: numbness left fingers 1-2, weak grip     Reflex Exam:  DTR's:    Deep tendon reflexes in the upper and lower extremities are normal bilaterally, bracio 2+, absent Biceps, Brisk triceos Toes:    The toes are upgoing  bilaterally. Clonus:    Clonus at AJs and kness, .    Assessment/Plan:  82 y.o. male here as requested by Early, Sung Amabile, NP for occipital neuralgia. PMHx significant for cervical surgery/acdf, Osteoarthritis of lumbar spine; DDD (degenerative disc disease),Rheumatoid arthritis (HCC);  DJD (degenerative joint disease), cervical; Vitamin D deficiency; History of systemic steroid therapy; and Occipital neuralgia of left side on their problem list.  This is a gentleman who is having neck problems since he fell several months ago.  He has multiple risk factors for fractures and serious neck injuries including long-term steroid use, rheumatoid arthritis, pre-existing cervical degenerative joint disease and surgery prior, imbalance.  Since the fall he cannot turn his head or he has shooting pain up his neck and down his left arm.  He has sensory changes radiating into his arm, acute arm weakness and numbness, all happened after a fall.   MRI cervical spine as above Occipital nerve block today reduced his pain from 8 to 0/10 Emg/ncs for possible carpal tunnel in hands    Orders Placed This Encounter  Procedures   MR CERVICAL SPINE WO CONTRAST   NCV with EMG(electromyography)   Meds ordered this encounter  Medications   bupivacaine (MARCAINE) 0.5 % (with pres) injection 4 mL   methylPREDNISolone acetate  (DEPO-MEDROL) injection 80 mg   lidocaine (XYLOCAINE) 2 % (with pres) injection 20 mg   methylPREDNISolone (MEDROL DOSEPAK) 4 MG TBPK tablet    Sig: Take pills daily all together with food. Take the first dose (6 pills) as soon as possible. Take the rest each morning. For 6 days total 6-5-4-3-2-1.    Dispense:  21 tablet    Refill:  1   Performed by Dr. Lucia Gaskins M.D.  30-gauge needle was used. All procedures a documented blood were medically necessary, reasonable and appropriate based on the patient's history, medical diagnosis and physician opinion. Verbal informed consent was obtained from the patient, patient was informed of potential risk of procedure, including bruising, bleeding, hematoma formation, infection, muscle weakness, muscle pain, numbness, transient hypertension, transient hyperglycemia and transient insomnia among others. All areas injected were topically clean with isopropyl rubbing alcohol. Nonsterile nonlatex gloves were worn during the procedure.  1. Greater occipital nerve block [phone]). The greater occipital nerve site was identified at the nuchal line medial to the occipital artery. Medication was injected into the left occipital nerve areas and suboccipital areas. Patient's condition is associated with inflammation of the greater occipital nerve and associated multiple groups. Injection was deemed medically necessary, reasonable and appropriate. Injection represents a separate and unique surgical service.  Cc: Early, Sung Amabile, NP,  Early, Sung Amabile, NP  Naomie Dean, MD  [facility] [address] [place], [place] [address]  Phone [phone] Fax [phone]

## 2023-06-16 NOTE — Patient Instructions (Addendum)
MRI lumbar spine Occipital nerve block Emg/ncs    Occipital Neuralgia  Occipital neuralgia is a type of headache that causes brief episodes of very bad pain in the back of the head. Pain from occipital neuralgia may spread (radiate) to other parts of the head. These headaches may be caused by irritation of the nerves that leave the spinal cord high up in the neck, just below the base of the skull (occipital nerves). The occipital nerves transmit sensations from the back of the head, the top of the head, and the areas behind the ears. What are the causes? This condition can occur without any known cause (primary headache syndrome). In other cases, this condition is caused by pressure on or irritation of one of the two occipital nerves. Pressure and irritation may be due to: Muscle spasm in the neck. Neck injury. Wear and tear of the vertebrae in the neck (osteoarthritis). Disease of the disks that separate the vertebrae. Swollen blood vessels that put pressure on the occipital nerves. Infections. Tumors. Diabetes. What are the signs or symptoms? This condition causes brief burning, stabbing, electric, shocking, or shooting pain in the back of the head that can radiate to the top of the head. It can happen on one side or both sides of the head. It can also cause: Pain behind the eye. Pain triggered by neck movement or hair brushing. Scalp tenderness. Aching in the back of the head between episodes of very bad pain. Pain that gets worse with exposure to bright lights. How is this diagnosed? Your health care provider may diagnose the condition based on a physical exam and your symptoms. Tests may be done, such as: Imaging studies of the brain and neck (cervical spine), such as an MRI or CT scan. These look for causes of pinched nerves. Applying pressure to the nerves in the neck to try to re-create the pain. Injection of numbing medicine into the occipital nerve areas to see if pain goes  away (diagnostic nerve block). How is this treated? Treatment for this condition may begin with simple measures, such as: Rest. Massage. Applying heat or cold to the area. Over-the-counter pain relievers. If these measures do not work, you may need other treatments, including: Medicines, such as: Prescription-strength anti-inflammatory medicines. Muscle relaxants. Anti-seizure medicines, which can relieve pain. Antidepressants, which can relieve pain. Injected medicines, such as medicines that numb the area (local anesthetic) and steroids. Pulsed radiofrequency ablation. This is when wires are implanted to deliver electrical impulses that block pain signals from the occipital nerve. Surgery to relieve nerve pressure. Physical therapy. Follow these instructions at home: Managing pain     Avoid any activities that cause pain. Rest when you have an attack of pain. Try gentle massage to relieve pain. Try a different pillow or sleeping position. If directed, apply heat to the affected area as often as told by your health care provider. Use the heat source that your health care provider recommends, such as a moist heat pack or a heating pad. Place a towel between your skin and the heat source. Leave the heat on for 20-30 minutes. Remove the heat if your skin turns bright red. This is especially important if you are unable to feel pain, heat, or cold. You have a greater risk of getting burned. If directed, put ice on the back of your head and neck area. To do this: Put ice in a plastic bag. Place a towel between your skin and the bag. Leave the ice on  for 20 minutes, 2-3 times a day. Remove the ice if your skin turns bright red. This is very important. If you cannot feel pain, heat, or cold, you have a greater risk of damage to the area. General instructions Take over-the-counter and prescription medicines only as told by your health care provider. Avoid things that make your symptoms  worse, such as bright lights. Try to stay active. Get regular exercise that does not cause pain. Ask your health care provider to suggest safe exercises for you. Work with a physical therapist to learn stretching exercises you can do at home. Practice good posture. Keep all follow-up visits. This is important. Contact a health care provider if: Your medicine is not working. You have new or worsening symptoms. Get help right away if: You have very bad head pain that does not go away. You have a sudden change in vision, balance, or speech. These symptoms may represent a serious problem that is an emergency. Do not wait to see if the symptoms will go away. Get medical help right away. Call your local emergency services (911 in the [place] Do not drive yourself to the hospital. Summary Occipital neuralgia is a type of headache that causes brief episodes of very bad pain in the back of the head. Pain from occipital neuralgia may spread (radiate) to other parts of the head. Treatment for this condition includes rest, massage, and medicines. This information is not intended to replace advice given to you by your health care provider. Make sure you discuss any questions you have with your health care provider. Carpal Tunnel Syndrome  Carpal tunnel syndrome is a condition that causes pain, numbness, and weakness in your hand and fingers. The carpal tunnel is a narrow area located on the palm side of your wrist. Repeated wrist motion or certain diseases may cause swelling within the tunnel. This swelling pinches the main nerve in the wrist. The main nerve in the wrist is called the median nerve. What are the causes? This condition may be caused by: Repeated and forceful wrist and hand motions. Wrist injuries. Arthritis. A cyst or tumor in the carpal tunnel. Fluid buildup during pregnancy. Use of tools that vibrate. Sometimes the cause of this condition is not known. What increases the risk? The  following factors may make you more likely to develop this condition: Having a job that requires you to repeatedly or forcefully move your wrist or hand or requires you to use tools that vibrate. This may include jobs that involve using computers, working on an [employer], or working with power tools such as Radiographer, therapeutic. Being a woman. Having certain conditions, such as: Diabetes. Obesity. An underactive thyroid (hypothyroidism). Kidney failure. Rheumatoid arthritis. What are the signs or symptoms? Symptoms of this condition include: A tingling feeling in your fingers, especially in your thumb, index, and middle fingers. Tingling or numbness in your hand. An aching feeling in your entire arm, especially when your wrist and elbow are bent for a long time. Wrist pain that goes up your arm to your shoulder. Pain that goes down into your palm or fingers. A weak feeling in your hands. You may have trouble grabbing and holding items. Your symptoms may feel worse during the night. How is this diagnosed? This condition is diagnosed with a medical history and physical exam. You may also have tests, including: Electromyogram (EMG). This test measures electrical signals sent by your nerves into the muscles. Nerve conduction study. This test measures how well  electrical signals pass through your nerves. Imaging tests, such as X-rays, ultrasound, and MRI. These tests check for possible causes of your condition. How is this treated? This condition may be treated with: Lifestyle changes. It is important to stop or change the activity that caused your condition. Doing exercise and activities to strengthen and stretch your muscles and tendons (physical therapy). Making lifestyle changes to help with your condition and learning how to do your daily activities safely (occupational therapy). Medicines for pain and inflammation. This may include medicine that is injected into your wrist. A wrist  splint or brace. Surgery. Follow these instructions at home: If you have a splint or brace: Wear the splint or brace as told by your health care provider. Remove it only as told by your health care provider. Loosen the splint or brace if your fingers tingle, become numb, or turn cold and blue. Keep the splint or brace clean. If the splint or brace is not waterproof: Do not let it get wet. Cover it with a watertight covering when you take a bath or shower. Managing pain, stiffness, and swelling If directed, put ice on the painful area. To do this: If you have a removeable splint or brace, remove it as told by your health care provider. Put ice in a plastic bag. Place a towel between your skin and the bag or between the splint or brace and the bag. Leave the ice on for 20 minutes, 2-3 times a day. Do not fall asleep with the cold pack on your skin. Remove the ice if your skin turns bright red. This is very important. If you cannot feel pain, heat, or cold, you have a greater risk of damage to the area. Move your fingers often to reduce stiffness and swelling. General instructions Take over-the-counter and prescription medicines only as told by your health care provider. Rest your wrist and hand from any activity that may be causing your pain. If your condition is work related, talk with your employer about changes that can be made, such as getting a wrist pad to use while typing. Do any exercises as told by your health care provider, physical therapist, or occupational therapist. Keep all follow-up visits. This is important. Contact a health care provider if: You have new symptoms. Your pain is not controlled with medicines. Your symptoms get worse. Get help right away if: You have severe numbness or tingling in your wrist or hand. Summary Carpal tunnel syndrome is a condition that causes pain, numbness, and weakness in your hand and fingers. It is usually caused by repeated wrist  motions. Lifestyle changes and medicines are used to treat carpal tunnel syndrome. Surgery may be recommended. Follow your health care provider's instructions about wearing a splint, resting from activity, keeping follow-up visits, and calling for help. This information is not intended to replace advice given to you by your health care provider. Make sure you discuss any questions you have with your health care provider. Document Revised: 04/20/2020 Document Reviewed: 04/20/2020 Elsevier Patient Education  2024 Elsevier Inc[employer] Methylprednisolone Tablets What is this medication? METHYLPREDNISOLONE (meth ill pred NISS oh lone) treats many conditions such as asthma, allergic reactions, arthritis, inflammatory bowel diseases, adrenal, and blood or bone marrow disorders. It works by decreasing inflammation, slowing down an overactive immune system, or replacing cortisol normally made in the body. Cortisol is a hormone that plays an important role in how the body responds to stress, illness, and injury. It belongs to a group of medications  called steroids. This medicine may be used for other purposes; ask your health care provider or pharmacist if you have questions. COMMON BRAND NAME(S): Medrol, Medrol Dosepak What should I tell my care team before I take this medication? They need to know if you have any of these conditions: Cushing's syndrome Eye disease, vision problems Diabetes Glaucoma Heart disease High blood pressure Infection especially a viral infection, such as chickenpox, cold sores, or herpes Liver disease Mental health conditions Myasthenia gravis Osteoporosis Recent or upcoming vaccine Seizures Stomach or intestine problems Thyroid disease An unusual or allergic reaction to lactose, methylprednisolone, other medications, foods, dyes, or preservatives Pregnant or trying to get pregnant Breastfeeding How should I use this medication? Take this medication by mouth with a glass of  water. Follow the directions on the prescription label. Take this medication with food. If you are taking this medication once a day, take it in the morning. Do not take it more often than directed. Do not suddenly stop taking your medication because you may develop a severe reaction. Your care team will tell you how much medication to take. If your care team wants you to stop the medication, the dose may be slowly lowered over time to avoid any side effects. Talk to your care team about the use of this medication in children. Special care may be needed. Overdosage: If you think you have taken too much of this medicine contact a poison control center or emergency room at once. NOTE: This medicine is only for you. Do not share this medicine with others. What if I miss a dose? If you miss a dose, take it as soon as you can. If it is almost time for your next dose, talk to your care team. You may need to miss a dose or take an extra dose. Do not take double or extra doses without advice. What may interact with this medication? Do not take this medication with any of the following: Alefacept Echinacea Live virus vaccines Metyrapone Mifepristone This medication may also interact with the following: Amphotericin B Aspirin and aspirin-like medications Certain antibiotics, such as erythromycin, clarithromycin, troleandomycin Certain medications for diabetes Certain medications for fungal infections, such as ketoconazole Certain medications for seizures, such as carbamazepine, phenobarbital, phenytoin Certain medications that treat or prevent blood clots, such as warfarin Cholestyramine Cyclosporine Digoxin Diuretics Estrogen or progestin hormones Isoniazid NSAIDs, medications for pain and inflammation, such as ibuprofen or naproxen Other medications for myasthenia gravis Rifampin Vaccines This list may not describe all possible interactions. Give your health care provider a list of all the  medicines, herbs, non-prescription drugs, or dietary supplements you use. Also tell them if you smoke, drink alcohol, or use illegal drugs. Some items may interact with your medicine. What should I watch for while using this medication? Tell your care team if your symptoms do not start to get better or if they get worse. Do not stop taking except on your care team's advice. You may develop a severe reaction. Your care team will tell you how much medication to take. This medication may increase your risk of getting an infection. Tell your care team if you are around anyone with measles or chickenpox, or if you develop sores or blisters that do not heal properly. This medication may increase blood sugar levels. Ask your care team if changes in diet or medications are needed if you have diabetes. Tell your care team right away if you have any change in your eyesight. Using this medication  for a long time may increase your risk of low bone mass. Talk to your care team about bone health. What side effects may I notice from receiving this medication? Side effects that you should report to your care team as soon as possible: Allergic reactions--skin rash, itching, hives, swelling of the face, lips, tongue, or throat Cushing syndrome--increased fat around the midsection, upper back, neck, or face, pink or purple stretch marks on the skin, thinning, fragile skin that easily bruises, unexpected hair growth High blood sugar (hyperglycemia)--increased thirst or amount of urine, unusual weakness or fatigue, blurry vision Increase in blood pressure Infection--fever, chills, cough, sore throat, wounds that don't heal, pain or trouble when passing urine, general feeling of discomfort or being unwell Low adrenal gland function--nausea, vomiting, loss of appetite, unusual weakness or fatigue, dizziness Mood and behavior changes--anxiety, nervousness, confusion, hallucinations, irritability, hostility, thoughts of  suicide or self-harm, worsening mood, feelings of depression Stomach bleeding--bloody or black, tar-like stools, vomiting blood or brown material that looks like coffee grounds Swelling of the ankles, hands, or feet Side effects that usually do not require medical attention (report to your care team if they continue or are bothersome): Acne General discomfort and fatigue Headache Increase in appetite Nausea Trouble sleeping Weight gain This list may not describe all possible side effects. Call your doctor for medical advice about side effects. You may report side effects to FDA at 1-800-FDA-1088. Where should I keep my medication? Keep out of the reach of children and pets. Store at room temperature between 20 and 25 degrees C (68 and 77 degrees F). Throw away any unused medication after the expiration date. NOTE: This sheet is a summary. It may not cover all possible information. If you have questions about this medicine, talk to your doctor, pharmacist, or health care provider.  2024 Elsevier/Gold Standard (2022-08-07 00:00:00)   Document Revised: 10/08/2020 Document Reviewed: 10/08/2020 Elsevier Patient Education  2024 [employer].

## 2023-06-17 ENCOUNTER — Encounter: Payer: Self-pay | Admitting: Neurology

## 2023-06-17 ENCOUNTER — Telehealth: Payer: Self-pay | Admitting: Neurology

## 2023-06-17 NOTE — Telephone Encounter (Signed)
sent to GI they obtain [employer] medicare auth [id number] 

## 2023-06-19 ENCOUNTER — Inpatient Hospital Stay: Payer: Medicare HMO

## 2023-06-19 VITALS — BP 126/56 | HR 52 | Temp 98.1°F | Resp 18

## 2023-06-19 DIAGNOSIS — D500 Iron deficiency anemia secondary to blood loss (chronic): Secondary | ICD-10-CM

## 2023-06-19 DIAGNOSIS — D509 Iron deficiency anemia, unspecified: Secondary | ICD-10-CM | POA: Diagnosis not present

## 2023-06-19 MED ORDER — SODIUM CHLORIDE 0.9 % IV SOLN
300.0000 mg | Freq: Once | INTRAVENOUS | Status: AC
  Administered 2023-06-19: 300 mg via INTRAVENOUS
  Filled 2023-06-19: qty 300

## 2023-06-19 MED ORDER — SODIUM CHLORIDE 0.9 % IV SOLN: Freq: Once | INTRAVENOUS | Status: AC

## 2023-06-19 NOTE — Patient Instructions (Signed)

## 2023-06-27 ENCOUNTER — Inpatient Hospital Stay: Payer: Medicare HMO | Attending: Oncology

## 2023-06-27 VITALS — BP 119/60 | HR 68 | Temp 98.6°F | Resp 18 | Ht 62.0 in | Wt 127.6 lb

## 2023-06-27 DIAGNOSIS — D509 Iron deficiency anemia, unspecified: Secondary | ICD-10-CM | POA: Insufficient documentation

## 2023-06-27 DIAGNOSIS — D500 Iron deficiency anemia secondary to blood loss (chronic): Secondary | ICD-10-CM

## 2023-06-27 MED ORDER — SODIUM CHLORIDE 0.9 % IV SOLN: Freq: Once | INTRAVENOUS | Status: AC

## 2023-06-27 MED ORDER — SODIUM CHLORIDE 0.9 % IV SOLN
300.0000 mg | Freq: Once | INTRAVENOUS | Status: AC
  Administered 2023-06-27: 300 mg via INTRAVENOUS
  Filled 2023-06-27: qty 300

## 2023-06-27 NOTE — Patient Instructions (Signed)

## 2023-06-30 ENCOUNTER — Encounter: Payer: Self-pay | Admitting: Neurology

## 2023-06-30 ENCOUNTER — Encounter: Payer: Self-pay | Admitting: Oncology

## 2023-07-01 ENCOUNTER — Ambulatory Visit
Admission: RE | Admit: 2023-07-01 | Discharge: 2023-07-01 | Disposition: A | Discharge status: Home / Self Care | Payer: Medicare HMO | Source: Ambulatory Visit | Attending: Neurology | Admitting: Neurology

## 2023-07-01 DIAGNOSIS — W19XXXA Unspecified fall, initial encounter: Secondary | ICD-10-CM

## 2023-07-01 DIAGNOSIS — R200 Anesthesia of skin: Secondary | ICD-10-CM

## 2023-07-01 DIAGNOSIS — M79642 Pain in left hand: Secondary | ICD-10-CM

## 2023-07-01 DIAGNOSIS — M069 Rheumatoid arthritis, unspecified: Secondary | ICD-10-CM

## 2023-07-01 DIAGNOSIS — M542 Cervicalgia: Secondary | ICD-10-CM

## 2023-07-01 DIAGNOSIS — R29898 Other symptoms and signs involving the musculoskeletal system: Secondary | ICD-10-CM

## 2023-07-01 DIAGNOSIS — Z9189 Other specified personal risk factors, not elsewhere classified: Secondary | ICD-10-CM

## 2023-07-01 DIAGNOSIS — M5412 Radiculopathy, cervical region: Secondary | ICD-10-CM

## 2023-07-02 ENCOUNTER — Inpatient Hospital Stay: Payer: Medicare HMO

## 2023-07-02 ENCOUNTER — Telehealth: Payer: Self-pay

## 2023-07-02 ENCOUNTER — Inpatient Hospital Stay: Payer: Medicare HMO | Admitting: Nurse Practitioner

## 2023-07-02 ENCOUNTER — Encounter: Payer: Self-pay | Admitting: Nurse Practitioner

## 2023-07-02 VITALS — BP 110/56 | HR 77 | Temp 97.9°F | Resp 17 | Wt 125.8 lb

## 2023-07-02 DIAGNOSIS — D500 Iron deficiency anemia secondary to blood loss (chronic): Secondary | ICD-10-CM | POA: Diagnosis not present

## 2023-07-02 DIAGNOSIS — D509 Iron deficiency anemia, unspecified: Secondary | ICD-10-CM | POA: Diagnosis not present

## 2023-07-02 LAB — CBC WITH DIFFERENTIAL (CANCER CENTER ONLY)
Abs Immature Granulocytes: 0.01 10*3/uL (ref 0.00–0.07)
Basophils Absolute: 0 10*3/uL (ref 0.0–0.1)
Basophils Relative: 1 %
Eosinophils Absolute: 0 10*3/uL (ref 0.0–0.5)
Eosinophils Relative: 1 %
HCT: 35.4 % — ABNORMAL LOW (ref 39.0–52.0)
Hemoglobin: 11.7 g/dL — ABNORMAL LOW (ref 13.0–17.0)
Immature Granulocytes: 0 %
Lymphocytes Relative: 15 %
Lymphs Abs: 0.8 10*3/uL (ref 0.7–4.0)
MCH: 28.5 pg (ref 26.0–34.0)
MCHC: 33.1 g/dL (ref 30.0–36.0)
MCV: 86.1 fL (ref 80.0–100.0)
Monocytes Absolute: 0.6 10*3/uL (ref 0.1–1.0)
Monocytes Relative: 13 %
Neutro Abs: 3.6 10*3/uL (ref 1.7–7.7)
Neutrophils Relative %: 70 %
Platelet Count: 204 10*3/uL (ref 150–400)
RBC: 4.11 MIL/uL — ABNORMAL LOW (ref 4.22–5.81)
RDW: 16.2 % — ABNORMAL HIGH (ref 11.5–15.5)
WBC Count: 5 10*3/uL (ref 4.0–10.5)
nRBC: 0 % (ref 0.0–0.2)

## 2023-07-02 LAB — FERRITIN: Ferritin: 450 ng/mL — ABNORMAL HIGH (ref 24–336)

## 2023-07-02 NOTE — Progress Notes (Signed)
  [facility] OFFICE PROGRESS NOTE   Diagnosis: Iron deficiency  INTERVAL HISTORY:   Tony Mcmillan returns as scheduled.  He completed Venofer 300 mg IV 06/12/2023, 06/19/2023 and 06/27/2023.  He reports tolerating the IV iron well with no signs of allergic reaction.  Energy level may be slightly improved.  He is not aware of any bleeding.  No shortness of breath.  Objective:  Vital signs in last 24 hours:  Blood pressure (!) 110/56, pulse 77, temperature 97.9 F (36.6 C), temperature source Temporal, resp. rate 17, weight 125 lb 12.8 oz (57.1 kg), SpO2 95 %.    Resp: Lungs clear bilaterally. Cardio: Regular rate and rhythm. GI: No hepatosplenomegaly. Vascular: No leg edema.   Lab Results:  Lab Results  Component Value Date   WBC 5.0 07/02/2023   HGB 11.7 (L) 07/02/2023   HCT 35.4 (L) 07/02/2023   MCV 86.1 07/02/2023   PLT 204 07/02/2023   NEUTROABS 3.6 07/02/2023    Imaging:  No results found.  Medications: I have reviewed the patient's current medications.  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  He tolerated the IV iron well.  Hemoglobin is better.  We will follow-up on the ferritin from today.  He will return for an office visit and lab in 8 weeks.    Josep Luviano ANP/GNP-BC   07/02/2023  1:42 PM

## 2023-07-02 NOTE — Telephone Encounter (Signed)
-----   Message from Rana Snare, NP sent at 07/02/2023  2:38 PM EDT ----- Let him know the ferritin level is better.  Follow-up as scheduled.

## 2023-07-02 NOTE — Telephone Encounter (Signed)
Patient gave verbal understanding and had no further questions or concerns  

## 2023-07-03 ENCOUNTER — Telehealth: Payer: Self-pay | Admitting: Neurology

## 2023-07-03 NOTE — Telephone Encounter (Signed)
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 This could be causing radiculopathy down his arms(the c7 and c8 nerves)bilaterally and into his digits 2-5 (but not the thumb). If his symptoms correspond to this we can send him for injections or evaluation for surgery. If his symptoms don't exactly correspond(like the thumb is involved) we can order an emg/ncs to look for  other causes such as carpal tunnel. We can also do an emg/ncs to confirm which nerve(s)  are pinched before sending for injections or surgery eval. His choice thanks. See phone note.  Sanda Linger Early

## 2023-07-03 NOTE — Telephone Encounter (Addendum)
I spoke with the patient and I discussed the message from Dr. Lucia Gaskins.  The patient states that his right upper extremity is not bad.  He does have some symptoms in his right wrist.  His left hand is painful and all 5 fingers are involved but he states finger 5 is "not too bad".  He also has some numbness.  He states the pain in his head improved from the visit where he had the nerve blocks but he still has pain and limited movement in his neck.  He put his son Gabriel Rung (who is not on the DPR) on speaker phone so I could explain everything to him as well.  We had an extended conversation.  Total call time was approximately 21 minutes.  We discussed the patient's results of his MRI C-spine and the patient's for next steps that Dr. Lucia Gaskins has offered.  His questions were answered.  He asked if the patient could see a chiropractor.  I discouraged him from seeing one at this time as he probably should not be having neck manipulation with his severe pinching of the nerves and while workup is underway and he may end up seeing a Careers adviser.  They have agreed to move forward with the EMG scheduled for 8/29 so patient does have symptoms in his thumb involved.  They would appreciate if there is a sooner opening. They were very appreciative for the call.   Patient gave me his son's number: [phone]

## 2023-07-21 ENCOUNTER — Ambulatory Visit (INDEPENDENT_AMBULATORY_CARE_PROVIDER_SITE_OTHER): Payer: Medicare HMO | Admitting: Neurology

## 2023-07-21 ENCOUNTER — Ambulatory Visit (INDEPENDENT_AMBULATORY_CARE_PROVIDER_SITE_OTHER): Payer: Self-pay | Admitting: Neurology

## 2023-07-21 DIAGNOSIS — M79642 Pain in left hand: Secondary | ICD-10-CM

## 2023-07-21 DIAGNOSIS — G5602 Carpal tunnel syndrome, left upper limb: Secondary | ICD-10-CM

## 2023-07-21 DIAGNOSIS — G5620 Lesion of ulnar nerve, unspecified upper limb: Secondary | ICD-10-CM

## 2023-07-21 DIAGNOSIS — M5412 Radiculopathy, cervical region: Secondary | ICD-10-CM

## 2023-07-21 DIAGNOSIS — Z0289 Encounter for other administrative examinations: Secondary | ICD-10-CM

## 2023-07-21 NOTE — Progress Notes (Addendum)
Full Name: Tony Mcmillan Gender: Male MRN #: [id number] Date of Birth: 02/13/1941    Visit Date: 07/21/2023 12:43 Age: 82 Years Examining Physician: Dr. Naomie Dean Referring Physician: Dr. Naomie Dean Height: 5 feet 2 inch     History: Patient with significant neck pain and cervical radiculopathy s/p cervical surgery c2-c6. 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 He reports he has numbness and paresthesias in left hand digits 1-5 which is the most severe. He also has numbness and paresthesias in the right hand moreso digits 1-3 but also digits 4-5. He has atrophy in the thenar and hypothenar muscles left > right.     Summary: EMG/NCS was performed on the bilateral upper extremities. EMG needle exam could not be performed on the paraspinal muscles as those are unreliable after surgery (appears he has a scar there and had posterior and anterior approach to his prior cervical c2-c6 surgery)  The right ulnar motot nerve conduction (when recording from the ADM) showed reduced amplitude (4.8 mV, normal greater than 6)  The right ulnar moto nerve conduction (when recording from the FDI) showed reduced amplitude (4.8 mV, normal greater than 6) and a 66m/s drop across the elbow (normal less than 10) The left ulnar motor conduction (when recording from the EDM) showed reduced amplitude (5.6 mV, normal greater than 6) and a 12 m/s drop across the elbow (normal less than 10). The left ulnar motor conduction (when recording from the FDI) showed a 10 m/s drop across the elbow (normal less than 10). The left median orthodromic sensory conduction showed delayed distal peak latency (3.6 ms, normal less than 3.4) and reduced amplitude (8 V, normal greater than 10). The left ulnar orthodromic digit 5 sensory nerve showed delayed distal peak latency (3.5 ms, normal less than  3.1). The left median/radial (dig I) comparison nerve showed prolonged distal peak latency (Median, 3.9 ms, N<3.1) and abnormal peak latency difference (Median-Radial, 1.0 ms, N<0.5) with a relative median delay.    The left flexor digitorum profundus ulnar head showed decreased insertional activity, polyphasic motor units and diminished motor unit recruitment  The right flexor digitorum profundus ulnar head showed decreased insertional activity and diminished motor unit recruitment  The left pronator teres showed spontaneous activity, prolonged motor unit duration, polyphasic motor units and diminished motor unit recruitment   The right pronator teres showed spontaneous activity  The left EDC showed spontaneous activity, polyphasic motor units and diminished motor unit recruitment  The left extensor indices proprius showed decreased insertional activity, polyphasic motor units and diminished motor unit recruitment  The left first dorsal interosseous showed  spontaneous activity, polyphasic motor units and diminished motor unit recruitment  The right first dorsal interosseous showed polyphasic motor units and diminished motor unit recruitment  The left opponens pollicis showed spontaneous activity, increased motor unit amplitude, polyphasic motor units and diminished motor unit recruitment  The right opponens pollicis showed polyphasic motor units and diminished motor unit recruitment  The left ADM showed spontaneous activity, increased motor unit amplitude, polyphasic motor units and diminished motor unit recruitment.  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  [facility] [address] [place], [place] [address] Tel: [phone] Fax: [phone]  Verbal informed consent was obtained from the patient, patient was informed of potential risk of procedure, including bruising, bleeding, hematoma formation, infection, muscle weakness, muscle pain, numbness, among others.     MNC    Nerve / Sites Muscle Latency Ref. Amplitude Ref. Rel Amp Segments Distance Velocity Ref. Area    ms ms mV mV %  cm m/s m/s mVms  L Median - APB     Wrist APB 3.9 ?4.4 6.7 ?4.0 100 Wrist - APB 7   19.2     Upper arm APB 8.0  5.6  83.9 Upper arm - Wrist 24 59 ?49 16.1  R Median - APB     Wrist APB 4.4 ?4.4 5.9 ?4.0 100 Wrist - APB 7   14.5     Upper arm APB 8.4  5.3  90.3 Upper arm - Wrist 22 55 ?49 13.5  L Ulnar - ADM     Wrist ADM 2.7 ?3.3 5.6 ?6.0 100 Wrist - ADM 7   15.6     B.Elbow ADM 4.9  4.6  81.8 B.Elbow - Wrist 11 51 ?49 13.9     A.Elbow ADM 8.2  4.4  95.9 A.Elbow - B.Elbow 13 39 ?49 12.4  R Ulnar - ADM     Wrist ADM 3.0 ?3.3 4.8 ?6.0 100 Wrist - ADM 7   12.2     B.Elbow ADM 5.0  4.3  89.2 B.Elbow - Wrist 12.6 63 ?49 12.1     A.Elbow ADM 7.9  3.8  88.7 A.Elbow - B.Elbow 16 55 ?49 12.0  L Ulnar - FDI     Wrist FDI 4.0 ?4.5 8.4 ?7.0 100 Wrist - FDI 8   17.8     B.Elbow FDI 6.1  6.8  80.3 B.Elbow - Wrist 11 54 ?49 15.4     A.Elbow FDI 9.0  6.0  88.3 A.Elbow - B.Elbow 13 44 ?49 15.7         A.Elbow - Wrist      R Ulnar - FDI     Wrist FDI 4.1 ?4.5 7.0 ?7.0 100 Wrist - FDI 8   13.5     B.Elbow FDI 6.2  7.2  102 B.Elbow - Wrist 12 59 ?49 13.2     A.Elbow FDI 9.6  7.0  97.8 A.Elbow - B.Elbow 16 46 ?49 13.3         A.Elbow - Wrist                     SNC    Nerve / Sites  Rec. Site Peak Lat Ref.  Amp Ref. Segments Distance Peak Diff Ref.    ms ms V V  cm ms ms  L Radial - Anatomical snuff box (Forearm)     Forearm Wrist 2.6 ?2.9 27 ?15 Forearm - Wrist 10    R Radial - Anatomical snuff box (Forearm)     Forearm Wrist 2.4 ?2.9 19 ?15 Forearm - Wrist 10    L Median, Radial - Thumb comparison     Median Wrist Thumb 3.9  8  Median Wrist - Thumb 10       Radial  Wrist Thumb 2.8  19  Radial Wrist - Thumb 10          Median Wrist - Radial Wrist  1.0 ?0.5  R Median, Radial - Thumb comparison     Median Wrist Thumb 2.3  6  Median Wrist - Thumb 10       Radial Wrist Thumb 2.9  8  Radial Wrist - Thumb 10          Median Wrist - Radial Wrist  -0.6 ?0.5  L Median - Orthodromic (Dig II, Mid palm)     Dig II Wrist 3.6 ?3.4 8 ?10 Dig II - Wrist 13    R Median - Orthodromic (Dig II, Mid palm)     Dig II Wrist 3.4 ?3.4 10 ?10 Dig II - Wrist 13    L Ulnar - Orthodromic, (Dig V, Mid palm)     Dig V Wrist 3.5 ?3.1 10 ?5 Dig V - Wrist 11    R Ulnar - Orthodromic, (Dig V, Mid palm)     Dig V Wrist 2.4 ?3.1 11 ?5 Dig V - Wrist 29                           F  Wave    Nerve F Lat Ref.   ms ms  L Ulnar - ADM 30.3 ?32.0  R Ulnar - ADM 30.6 ?32.0         EMG Summary Table    Spontaneous MUAP Recruitment  Muscle IA Fib PSW Fasc Other Amp Dur. Poly Pattern  L. Deltoid Normal None None None _______ Normal Normal Normal Normal  R. Deltoid Normal None None None _______ Normal Normal Normal Normal  L. Triceps brachii Normal None None None _______ Normal Normal Normal Normal  R. Triceps brachii Normal None None None _______ Normal Normal Normal Normal  L. Biceps brachii Normal None None None _______ Normal Normal Normal Normal  R. Biceps brachii Normal None None None _______ Normal Normal Normal Normal  L. Flexor digitorum profundus (Ulnar) Decreased None None None _______ Normal Normal 1+ Reduced  R. Flexor digitorum profundus (Ulnar) decreased None None None _______ Normal  Normal Normal Reduced  L. Pronator teres Normal 3+ 2+ None _______ Normal Increased 2+ Reduced  R. Pronator teres Normal None 2+ None _______ Normal Normal Normal Normal  L. Extensor digitorum communis Normal None 2+ None _______ Normal Normal 2+ Reduced  R. Extensor digitorum communis Normal None None None _______ Normal Normal Normal Normal  L. Extensor indicis proprius Decreased None None None _______ Normal Normal 2+ Reduced  R. Extensor indicis proprius Normal None None None _______ Normal Normal Normal Normal  L. First dorsal interosseous Normal None 2+ None _______ Normal Normal 2+ Reduced  R. First dorsal interosseous Normal None None None _______ Normal Normal 2+ Reduced  L. Opponens pollicis Normal None 2+ None _______ Increased Normal 3+ Reduced  R. Opponens pollicis Normal None None None _______ Normal Normal 2+ Reduced  L. ADM Normal None 2+ None _______ Increased Normal 2+ Reduced  R. ADM Normal None None None _______ Normal Normal Normal Normal

## 2023-07-28 NOTE — Procedures (Signed)
Full Name: Tony Mcmillan Gender: Male MRN #: [id number] Date of Birth: January 07, 1941    Visit Date: 07/21/2023 12:43 Age: 82 Years Examining Physician: Dr. Naomie Dean Referring Physician: Dr. Naomie Dean Height: 5 feet 2 inch     History: Patient with significant neck pain and cervical radiculopathy s/p cervical surgery c2-c6. 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 He reports he has numbness and paresthesias in left hand digits 1-5 which is the most severe. He also has numbness and paresthesias in the right hand moreso digits 1-3 but also digits 4-5. He has atrophy in the thenar and hypothenar muscles left > right.     Summary: EMG/NCS was performed on the bilateral upper extremities. EMG needle exam could not be performed on the paraspinal muscles as those are unreliable after surgery (appears he has a scar there and had posterior and anterior approach to his prior cervical c2-c6 surgery)  The right ulnar motot nerve conduction (when recording from the ADM) showed reduced amplitude (4.8 mV, normal greater than 6)  The right ulnar moto nerve conduction (when recording from the FDI) showed reduced amplitude (4.8 mV, normal greater than 6) and a 66m/s drop across the elbow (normal less than 10) The left ulnar motor conduction (when recording from the EDM) showed reduced amplitude (5.6 mV, normal greater than 6) and a 12 m/s drop across the elbow (normal less than 10). The left ulnar motor conduction (when recording from the FDI) showed a 10 m/s drop across the elbow (normal less than 10). The left median orthodromic sensory conduction showed delayed distal peak latency (3.6 ms, normal less than 3.4) and reduced amplitude (8 V, normal greater than 10). The left ulnar orthodromic digit 5 sensory nerve showed delayed distal peak latency (3.5 ms, normal less than  3.1). The left median/radial (dig I) comparison nerve showed prolonged distal peak latency (Median, 3.9 ms, N<3.1) and abnormal peak latency difference (Median-Radial, 1.0 ms, N<0.5) with a relative median delay.    The left flexor digitorum profundus ulnar head showed decreased insertional activity, polyphasic motor units and diminished motor unit recruitment  The right flexor digitorum profundus ulnar head showed decreased insertional activity and diminished motor unit recruitment  The left pronator teres showed spontaneous activity, prolonged motor unit duration, polyphasic motor units and diminished motor unit recruitment   The right pronator teres showed spontaneous activity  The left EDC showed spontaneous activity, polyphasic motor units and diminished motor unit recruitment  The left extensor indices proprius showed decreased insertional activity, polyphasic motor units and diminished motor unit recruitment  The left first dorsal interosseous showed  spontaneous activity, polyphasic motor units and diminished motor unit recruitment  The right first dorsal interosseous showed polyphasic motor units and diminished motor unit recruitment  The left opponens pollicis showed spontaneous activity, increased motor unit amplitude, polyphasic motor units and diminished motor unit recruitment  The right opponens pollicis showed polyphasic motor units and diminished motor unit recruitment  The left ADM showed spontaneous activity, increased motor unit amplitude, polyphasic motor units and diminished motor unit recruitment.   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  In addition there was acute/ongoing denervation and chronic neurogenic changes in muscles that share C7/C8 innervation, consistent with findings on his his MRI cervical spine  and double crush syndrome. 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  [facility] [address], [address] [place], [place] [address] Tel: [phone] Fax: [phone]  Verbal informed consent was obtained from the patient, patient was informed of potential risk of procedure, including bruising, bleeding, hematoma formation, infection, muscle weakness, muscle pain, numbness, among others.     MNC    Nerve / Sites Muscle Latency Ref. Amplitude Ref. Rel Amp Segments Distance Velocity Ref. Area    ms ms mV mV %  cm m/s m/s mVms  L Median - APB     Wrist APB 3.9 ?4.4 6.7 ?4.0 100 Wrist - APB 7   19.2     Upper arm APB 8.0  5.6  83.9 Upper arm - Wrist 24 59 ?49 16.1  R Median - APB     Wrist APB 4.4 ?4.4 5.9 ?4.0 100 Wrist - APB 7   14.5     Upper arm APB 8.4  5.3  90.3 Upper arm - Wrist 22 55 ?49 13.5  L Ulnar - ADM     Wrist ADM 2.7 ?3.3 5.6 ?6.0 100 Wrist - ADM 7   15.6     B.Elbow ADM 4.9  4.6  81.8 B.Elbow - Wrist 11 51 ?49 13.9     A.Elbow ADM 8.2  4.4  95.9 A.Elbow - B.Elbow 13 39 ?49 12.4  R Ulnar - ADM     Wrist ADM 3.0 ?3.3 4.8 ?6.0 100 Wrist - ADM 7   12.2     B.Elbow ADM 5.0  4.3  89.2 B.Elbow - Wrist 12.6 63 ?49 12.1     A.Elbow ADM 7.9  3.8  88.7 A.Elbow - B.Elbow 16 55 ?49 12.0  L Ulnar - FDI     Wrist FDI 4.0 ?4.5 8.4 ?7.0 100 Wrist - FDI 8   17.8     B.Elbow FDI 6.1  6.8  80.3 B.Elbow - Wrist 11 54 ?49 15.4     A.Elbow FDI 9.0  6.0  88.3 A.Elbow - B.Elbow 13 44 ?49 15.7         A.Elbow - Wrist      R Ulnar - FDI     Wrist FDI 4.1 ?4.5 7.0 ?7.0 100 Wrist - FDI 8   13.5     B.Elbow FDI 6.2  7.2  102 B.Elbow - Wrist 12 59 ?49 13.2     A.Elbow FDI 9.6  7.0  97.8 A.Elbow - B.Elbow 16 46 ?49 13.3         A.Elbow - Wrist                     SNC    Nerve / Sites  Rec. Site Peak Lat Ref.  Amp Ref. Segments Distance Peak Diff Ref.    ms ms V V  cm ms ms  L Radial - Anatomical snuff box (Forearm)     Forearm Wrist 2.6 ?2.9 27 ?15 Forearm - Wrist 10    R Radial - Anatomical snuff box (Forearm)     Forearm Wrist 2.4 ?2.9 19 ?15 Forearm - Wrist 10    L Median, Radial - Thumb comparison     Median Wrist Thumb 3.9  8  Median Wrist - Thumb 10  Radial Wrist Thumb 2.8  19  Radial Wrist - Thumb 10          Median Wrist - Radial Wrist  1.0 ?0.5  R Median, Radial - Thumb comparison     Median Wrist Thumb 2.3  6  Median Wrist - Thumb 10       Radial Wrist Thumb 2.9  8  Radial Wrist - Thumb 10          Median Wrist - Radial Wrist  -0.6 ?0.5  L Median - Orthodromic (Dig II, Mid palm)     Dig II Wrist 3.6 ?3.4 8 ?10 Dig II - Wrist 13    R Median - Orthodromic (Dig II, Mid palm)     Dig II Wrist 3.4 ?3.4 10 ?10 Dig II - Wrist 13    L Ulnar - Orthodromic, (Dig V, Mid palm)     Dig V Wrist 3.5 ?3.1 10 ?5 Dig V - Wrist 11    R Ulnar - Orthodromic, (Dig V, Mid palm)     Dig V Wrist 2.4 ?3.1 11 ?5 Dig V - Wrist 72                           F  Wave    Nerve F Lat Ref.   ms ms  L Ulnar - ADM 30.3 ?32.0  R Ulnar - ADM 30.6 ?32.0         EMG Summary Table    Spontaneous MUAP Recruitment  Muscle IA Fib PSW Fasc Other Amp Dur. Poly Pattern  L. Deltoid Normal None None None _______ Normal Normal Normal Normal  R. Deltoid Normal None None None _______ Normal Normal Normal Normal  L. Triceps brachii Normal None None None _______ Normal Normal Normal Normal  R. Triceps brachii Normal None None None _______ Normal Normal Normal Normal  L. Biceps brachii Normal None None None _______ Normal Normal Normal Normal  R. Biceps brachii Normal None None None _______ Normal Normal Normal Normal  L. Flexor digitorum profundus (Ulnar) Decreased None None None _______ Normal Normal 1+ Reduced  R. Flexor digitorum profundus (Ulnar) decreased None None None _______ Normal  Normal Normal Reduced  L. Pronator teres Normal 3+ 2+ None _______ Normal Increased 2+ Reduced  R. Pronator teres Normal None 2+ None _______ Normal Normal Normal Normal  L. Extensor digitorum communis Normal None 2+ None _______ Normal Normal 2+ Reduced  R. Extensor digitorum communis Normal None None None _______ Normal Normal Normal Normal  L. Extensor indicis proprius Decreased None None None _______ Normal Normal 2+ Reduced  R. Extensor indicis proprius Normal None None None _______ Normal Normal Normal Normal  L. First dorsal interosseous Normal None 2+ None _______ Normal Normal 2+ Reduced  R. First dorsal interosseous Normal None None None _______ Normal Normal 2+ Reduced  L. Opponens pollicis Normal None 2+ None _______ Increased Normal 3+ Reduced  R. Opponens pollicis Normal None None None _______ Normal Normal 2+ Reduced  L. ADM Normal None 2+ None _______ Increased Normal 2+ Reduced  R. ADM Normal None None None _______ Normal Normal Normal Normal

## 2023-07-29 ENCOUNTER — Inpatient Hospital Stay: Payer: Medicare HMO

## 2023-07-29 ENCOUNTER — Inpatient Hospital Stay: Payer: Medicare HMO | Admitting: Nurse Practitioner

## 2023-07-29 ENCOUNTER — Telehealth: Payer: Self-pay

## 2023-07-29 DIAGNOSIS — M5412 Radiculopathy, cervical region: Secondary | ICD-10-CM

## 2023-07-29 NOTE — Telephone Encounter (Signed)
Will send to Dr Lucia Gaskins. I didn't see anything in the EMG visit.

## 2023-07-29 NOTE — Telephone Encounter (Signed)
Patient left a voicemail regarding a referral that Dr Lucia Gaskins was going to send in for him. He stated it was for a nerve shot for his neck pain. I was not able to find anything in his chart after his NCV EMG. Can you please reach out to inquire about this? Thanks!

## 2023-07-30 ENCOUNTER — Other Ambulatory Visit: Payer: Self-pay | Admitting: Neurology

## 2023-07-30 DIAGNOSIS — G5600 Carpal tunnel syndrome, unspecified upper limb: Secondary | ICD-10-CM

## 2023-07-30 DIAGNOSIS — G5620 Lesion of ulnar nerve, unspecified upper limb: Secondary | ICD-10-CM

## 2023-07-30 DIAGNOSIS — M5412 Radiculopathy, cervical region: Secondary | ICD-10-CM

## 2023-07-30 NOTE — Telephone Encounter (Signed)
Pt called wanting to know when he will get a call back to discuss the injections that he was to get for his pain. Pt states that in last visit it was discussed that he was going to be referred to start receiving injections for his pain. Please advise.

## 2023-07-30 NOTE — Telephone Encounter (Signed)
I sent the emg to his orthopaedic physician Dr. Ophelia Charter. I will also place a referral but I think Dr. Ophelia Charter is just busy like everyone else and will get to patient as soon as possible. thanks

## 2023-07-31 NOTE — Telephone Encounter (Signed)
Contacted pt, informed him Dr Lucia Gaskins sent the emg to his orthopaedic physician Dr. Ophelia Charter. She also placed a referral for him to review and decide next steps. Either our office or his would be back in touch with him as soon as possible. He verbally understood and was appreciative.

## 2023-08-01 ENCOUNTER — Other Ambulatory Visit: Payer: Self-pay | Admitting: Physical Medicine and Rehabilitation

## 2023-08-01 NOTE — Addendum Note (Signed)
Addended by: Rogers Seeds on: 08/01/2023 09:30 AM   Modules accepted: Orders

## 2023-08-01 NOTE — Telephone Encounter (Signed)
Cervical ESI referral entered.

## 2023-08-04 ENCOUNTER — Ambulatory Visit: Payer: Medicare HMO | Admitting: Surgical

## 2023-08-07 ENCOUNTER — Other Ambulatory Visit: Payer: Self-pay | Admitting: Physician Assistant

## 2023-08-07 MED ORDER — ISOSORBIDE MONONITRATE ER 30 MG PO TB24: 15.0000 mg | ORAL_TABLET | Freq: Every day | ORAL | 0 refills | Status: DC

## 2023-08-07 NOTE — Addendum Note (Signed)
Addended by: Lajoyce Lauber on: 08/07/2023 03:19 PM   Modules accepted: Orders

## 2023-08-07 NOTE — Addendum Note (Signed)
Addended by: Lajoyce Lauber on: 08/07/2023 03:13 PM   Modules accepted: Orders

## 2023-08-11 ENCOUNTER — Ambulatory Visit: Payer: Medicare HMO | Admitting: Physical Medicine and Rehabilitation

## 2023-08-11 ENCOUNTER — Encounter: Payer: Self-pay | Admitting: Physical Medicine and Rehabilitation

## 2023-08-11 DIAGNOSIS — M4802 Spinal stenosis, cervical region: Secondary | ICD-10-CM | POA: Diagnosis not present

## 2023-08-11 DIAGNOSIS — M5412 Radiculopathy, cervical region: Secondary | ICD-10-CM | POA: Diagnosis not present

## 2023-08-11 DIAGNOSIS — M542 Cervicalgia: Secondary | ICD-10-CM

## 2023-08-11 DIAGNOSIS — R269 Unspecified abnormalities of gait and mobility: Secondary | ICD-10-CM

## 2023-08-11 NOTE — Progress Notes (Unsigned)
Tony Mcmillan - 82 y.o. male MRN [id number]  Date of birth: 1941-05-26  Office Visit Note: Visit Date: 08/11/2023 PCP: Tollie Eth, NP Referred by: Tollie Eth, NP  Subjective: Chief Complaint  Patient presents with   Neck - Pain   HPI: Tony Mcmillan is a 82 y.o. male who comes in today per the request of Dr. Annell Greening for evaluation of chronic, worsening and severe left sided neck pain radiating down arm to middle, index and thumb. Intermittent pain radiating up to his head. Pain ongoing for several years, worsens with movement and activity. He does report numbness/tingling to left middle, index and thumb. He describes pain as sore and aching sensation, currently rates as 8 out of 10. Some relief of pain with home exercise regimen, rest and use of medications. Currently taking Gabapentin as needed. 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 There is uncovertebral joint hypertrophy and severe bilateral foraminal stenosis at C7-T1. No history of cervical injections. He was evaluated by Dr. Jaclynn Major with [facility], per his notes plan was to perform left C5-C6 and C6-C7 facet joint medial branch blocks. Patient states he did not receive call back to schedule injections. More recently he was evaluated by Dr. Naomie Dean with [facility], she performed bilateral upper extremity NCV with EMG on 07/21/2023 that shows left greater than right ulnar neuropathy and bilateral left > right C7/C8 radiculopathy. 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 Patient continues to have severe pain, would like to try cervical injection if warranted. Patient ambulates with cane. Patient denies focal weakness. No recent trauma or falls.    Review of Systems  Musculoskeletal:  Positive for neck  pain.  Neurological:  Positive for tingling. Negative for focal weakness and weakness.  All other systems reviewed and are negative.  Otherwise per HPI.  Assessment & Plan: Visit Diagnoses:    ICD-10-CM   1. Radiculopathy, cervical region  M54.12     2. Neck pain on left side  M54.2     3. 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 Gait abnormality  R26.9        Plan: Findings:  Chronic, worsening and severe left sided neck pain radiating down arm to right middle, index and thumb. Intermittent radiation of pain up to head. Patient continues to have severe pain despite good conservative therapies such as home exercise regimen, rest and use of medications. Patients clinical presentation and exam are consistent with C6/C7 nerve pattern. Paresthesias to right middle, index and thumb. Recent lumbar MRI does show uncovertebral joint hypertrophy with severe bilateral foraminal stenosis at C7-T1. Recent bilateral upper extremity NCV with EMG shows left greater than right ulnar neuropathy and bilateral left > right C7/C8 radiculopathy, more of a double crush syndrome. His exam today is non focal, good strength noted to bilateral upper extremities. Next step is to perform diagnostic and hopefully therapeutic left C7-T1 under fluoroscopic guidance. He is not currently taking anticoagulants. If good relief of pain with injection we can repeat this procedure infrequently as needed. Would recommend patient follow up with Dr. Ophelia Charter post injection. No red flag symptoms noted upon exam today.     Meds & Orders: No orders of the defined types were placed in this encounter.  No orders of the defined types were  placed in this encounter.   Follow-up: Return for Left C7-T1 interlaminar epidural steroid injection.   Procedures: No procedures performed      Clinical History: Narrative & Impression [facility]   NEUROIMAGING REPORT     STUDY DATE: 07/01/23 PATIENT NAME: Tony Mcmillan DOB: 1941-05-22 MRN: [id number]   ORDERING CLINICIAN: Anson Fret, MD  CLINICAL HISTORY: 82 y.o. year old male with:  1. Left hand pain  2. Cervical radiculopathy  3. Fall, initial encounter  4. Pain of neck with recent traumatic injury  5. Left arm numbness  6. Left arm weakness  7. Decreased ROM of neck  8. At high risk for fracture  9. Rheumatoid arthritis, involving unspecified site, unspecified whether rheumatoid factor present (HCC)      EXAM: MR CERVICAL SPINE WO CONTRAST  TECHNIQUE: MRI of the cervical spine was obtained utilizing multiplanar, multiecho pulse sequences. CONTRAST:   Diagnostic Product Medications (last 72 hours)       None        COMPARISON: 03/22/23 CT   IMAGING SITE: [facility] AT [address] [place] [address]       FINDINGS:    On sagittal views the vertebral bodies have normal height and alignment.  Anterior cervical discectomy and fusion with metal hardware (anteriorly and posteriorly) spanning C2-C6 levels with interbody fusion device spanning C3-C5 vertebral bodies. 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  The posterior fossa, pituitary gland and paraspinal soft tissues are unremarkable.    On axial views: C2-3 no spinal stenosis or foraminal narrowing C3-4 no spinal stenosis or foraminal narrowing C4-5 no spinal stenosis or foraminal narrowing  C5-6 no spinal stenosis or foraminal narrowing C6-7 no spinal stenosis or foraminal narrowing C7-T1 disc bulging and uncovertebral joint hypertrophy with severe bilateral foraminal stenosis T1-2 disc bulging and facet hypertrophy with mild bilateral foraminal stenosis T2-3 no spinal stenosis or foraminal narrowing   Limited views of the soft tissues of the head and neck are unremarkable.     IMPRESSION:    MRI cervical spine (without) demonstrating: - Cervical discectomy and  fusion spanning C2-C6 with anterior and posterior hardware and interbody fusion device from C3-C5 levels. - At C7-T1 disc bulging and uncovertebral joint hypertrophy with severe bilateral foraminal stenosis. - At T1-2 disc bulging and facet hypertrophy with mild bilateral foraminal stenosis.       INTERPRETING PHYSICIAN:  Suanne Marker, MD Certified in Neurology, Neurophysiology and Neuroimaging   [facility] [address] [place], [place] [address] [phone]   He reports that he quit smoking about 29 years ago. His smoking use included cigarettes. He started smoking about 46 years ago. He has a 17 pack-year smoking history. He has never used smokeless tobacco. No results for input(s): "HGBA1C", "LABURIC" in the last 8760 hours.  Objective:  VS:  HT:    WT:   BMI:     BP:   HR: bpm  TEMP: ( )  RESP:  Physical Exam Vitals and nursing note reviewed.  HENT:     Head: Normocephalic and atraumatic.     Right Ear: External ear normal.     Left Ear: External ear normal.     Nose: Nose normal.     Mouth/Throat:     Mouth: Mucous membranes are moist.  Eyes:     Extraocular Movements: Extraocular movements intact.  Cardiovascular:  Rate and Rhythm: Normal rate.     Pulses: Normal pulses.  Pulmonary:     Effort: Pulmonary effort is normal.  Abdominal:     General: Abdomen is flat. There is no distension.  Musculoskeletal:        General: Tenderness present.     Cervical back: Tenderness present.     Comments: No discomfort noted with flexion, extension and side-to-side rotation. Patient has good strength in the upper extremities including 5 out of 5 strength in wrist extension, long finger flexion and APB. Shoulder range of motion is full bilaterally without any sign of impingement. There is no atrophy of the hands intrinsically. Sensation intact bilaterally. Negative Hoffman's sign. Negative Spurling's sign.     Skin:    General: Skin is  warm and dry.     Capillary Refill: Capillary refill takes less than 2 seconds.  Neurological:     Mental Status: He is alert and oriented to person, place, and time.     Gait: Gait abnormal.  Psychiatric:        Mood and Affect: Mood normal.        Behavior: Behavior normal.     Ortho Exam  Imaging: No results found.  Past Medical/Family/Surgical/Social History: Medications & Allergies reviewed per EMR, new medications updated. Patient Active Problem List   Diagnosis Date Noted   Iron deficiency anemia due to chronic blood loss 06/02/2023   Occipital neuralgia of left side 03/26/2023   COPD (chronic obstructive pulmonary disease) (HCC) 08/08/2022   Olecranon bursitis of right elbow 05/24/2022   Other long term (current) drug therapy 05/09/2022   Pulmonary fibrosis (HCC) 05/09/2022   Absolute anemia 05/09/2022   Mixed hyperlipidemia 01/21/2022   Premature atrial contractions 01/20/2022   CAD (coronary artery disease)    Enlarged prostate 03/10/2020   Aortic valve calcification 03/10/2020   Bladder wall thickening 03/10/2020   Weight loss 11/15/2019   Bilateral carotid bruits 02/09/2019   History of systemic steroid therapy 10/30/2017   Pulmonary emphysema (HCC) 12/18/2016   Rheumatoid arthritis (HCC) 11/04/2016   High risk medication use 11/04/2016   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   Vitamin D deficiency 11/04/2016   Asymptomatic microscopic hematuria 04/03/2016   Benign prostatic hyperplasia 04/03/2016   Renal cyst, left 04/03/2016   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 Sherryl [facility].     Atypical chest pain    a.  09/2016 MV: EF 63%, normal perfusion.   Benign prostatic hyperplasia 04/03/2016   Asymptomatic. Normal PSA and exam by Dr. Sherryl [facility] 01/2016.     Bilateral carotid bruits 02/09/2019   Bladder wall thickening 03/10/2020   CAD (coronary artery disease)    Coronary artery calcification on Chest CT in 09/2016 and 01/2019 // Myoview 10/17: normal perfusion // Myoview 01/2019: no ischemia or infarction, EF 58 // Coronary CTA 03/2020: Calcium Score 1067; non-obstructive CAD [oLM 1-24, LAD ost 25049, mid and dist 1-24; D1 1-24; LCx prox and mid 1-24, OM1 and OM2 25-49, RCA and PLB 1-24; PLA 1-24]    Carotid bruit    Carotid Dopplers 01/2019: no ICA stenosis bilaterally    Chronic wrist pain  07/02/2016   Colonic polyp 02/23/2016   COVID-19    DDD (degenerative disc disease), 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 L4-5 and L5-S1    Diastolic dysfunction    a. 2015 Echo: EF >55%;  b. 09/2016 Echo: EF 55-60%, no rwma, Gr1 DD, triv AI/MR, mild TR, PASP .   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   On CT 01/2016    Palpitations    Echo 10/17: Mild focal basal septal hypertrophy, EF 55-60, no RWMA, Gr 1 DD, trivial MR, normal RVSF, mild TR, PASP 28 // Event monitor 01/2019: NSR, PACs   Prediabetes 10/29/2018   Pulmonary emphysema (HCC) 12/18/2016   Renal cyst, left 04/03/2016   On CT, benign    Rheumatoid arthritis (HCC)    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   -RF,-CCP,-1433eta, erosive disease with  contractures   Tubular adenoma of colon 01/2016   Vitamin D deficiency 11/04/2016   Family History  Problem Relation Age of Onset   Hypertension Mother    Hypertension Father    Diabetes Father    Heart disease Father        stents   Hyperlipidemia Sister    Diabetes Sister    Stroke Brother    Diabetes Brother    Heart disease Brother        stents   Colon polyps Neg Hx    Esophageal cancer Neg Hx    Stomach cancer Neg Hx    Rectal cancer Neg Hx    Colon cancer Neg Hx    Past Surgical History:  Procedure Laterality Date   ANTERIOR CERVICAL CORPECTOMY     ANTERIOR CERVICAL CORPECTOMY  12/2014   for infection. this was in [place]   CATARACT EXTRACTION     both eyes   COLONOSCOPY     COLOSTOMY REVERSAL     had infection on buttocks and had to have a skin graft- had colostomy to help area stay clean and heal   HERNIA REPAIR     POLYPECTOMY     SPINE SURGERY     Social History   Occupational History   Not on file  Tobacco Use   Smoking status: Former    Current packs/day: 0.00    Average packs/day: 1 pack/day for 17.0 years (17.0 ttl pk-yrs)    Types: Cigarettes    Start date: 12/23/1976    Quit date: 12/23/1993    Years since quitting: 29.6   Smokeless tobacco: Never  Vaping Use   Vaping status: Never Used  Substance and Sexual Activity   Alcohol use: No    Alcohol/week: 0.0 standard drinks of alcohol   Drug use: No   Sexual activity: Yes

## 2023-08-11 NOTE — Progress Notes (Unsigned)
Functional Pain Scale - descriptive words and definitions  Moderate (4)   Constantly aware of pain, can complete ADLs with modification/sleep marginally affected at times/passive distraction is of no use, but active distraction gives some relief. Moderate range order  Average Pain  varies  Neck pain on left side that radiates down to the index and middle fingers and up into the head

## 2023-08-15 ENCOUNTER — Telehealth: Payer: Self-pay | Admitting: Physical Medicine and Rehabilitation

## 2023-08-15 ENCOUNTER — Other Ambulatory Visit: Payer: Self-pay | Admitting: Physical Medicine and Rehabilitation

## 2023-08-15 DIAGNOSIS — M5412 Radiculopathy, cervical region: Secondary | ICD-10-CM

## 2023-08-15 NOTE — Telephone Encounter (Signed)
I called patient to discuss surgery.  He stated he wants to try injection per Dr. Alvester Morin and then see Dr. Ophelia Charter to further discuss need for surgery after determining relief from injection.  Dr. [place] Blas CMA will be calling patient to schedule injection.

## 2023-08-15 NOTE — Telephone Encounter (Signed)
Patient called wanted to know did you call him for an shot in his left hand. CB#[phone]. He said he spoke to [employer].

## 2023-08-18 ENCOUNTER — Ambulatory Visit: Payer: Medicare HMO | Admitting: Psychiatry

## 2023-08-18 NOTE — Telephone Encounter (Signed)
Spoke with patient and scheduled injection for 08/21/23. Patient aware driver needed

## 2023-08-21 ENCOUNTER — Ambulatory Visit: Payer: Medicare HMO | Admitting: Physical Medicine and Rehabilitation

## 2023-08-21 ENCOUNTER — Encounter: Payer: Medicare HMO | Admitting: Neurology

## 2023-08-21 ENCOUNTER — Other Ambulatory Visit: Payer: Self-pay

## 2023-08-21 VITALS — BP 122/70 | HR 65

## 2023-08-21 DIAGNOSIS — M5412 Radiculopathy, cervical region: Secondary | ICD-10-CM | POA: Diagnosis not present

## 2023-08-21 MED ORDER — METHYLPREDNISOLONE ACETATE 80 MG/ML IJ SUSP
80.0000 mg | Freq: Once | INTRAMUSCULAR | Status: AC
  Administered 2023-08-21: 80 mg

## 2023-08-21 NOTE — Patient Instructions (Signed)

## 2023-08-21 NOTE — Progress Notes (Signed)
Functional Pain Scale - descriptive words and definitions  Intense (8)    Cannot complete any ADLs without much assistance/cannot concentrate/conversation is difficult/unable to sleep and unable to use distraction. Severe range order  Average Pain 7   +Driver, -BT, -Dye Allergies.

## 2023-08-22 ENCOUNTER — Other Ambulatory Visit: Payer: Self-pay | Admitting: Physician Assistant

## 2023-08-23 NOTE — Procedures (Signed)
Cervical Epidural Steroid Injection - Interlaminar Approach with Fluoroscopic Guidance  Patient: Tony Mcmillan      Date of Birth: [birth year] MRN: [id number] PCP: Tollie Eth, NP      Visit Date: 08/21/2023   Universal Protocol:    Date/Time: 08/31/245:32 PM  Consent Given By: the patient  Position: PRONE  Additional Comments: Vital signs were monitored before and after the procedure. Patient was prepped and draped in the usual sterile fashion. The correct patient, procedure, and site was verified.   Injection Procedure Details:   Procedure diagnoses: Radiculopathy, cervical region [M54.12]    Meds Administered:  Meds ordered this encounter  Medications   methylPREDNISolone acetate (DEPO-MEDROL) injection 80 mg     Laterality: Left  Location/Site: C7-T1  Needle: 3.5 in., 20 ga. Tuohy  Needle Placement: Paramedian epidural space  Findings:  -Comments: Excellent flow of contrast into the epidural space.  Procedure Details: Using a paramedian approach from the side mentioned above, the region overlying the inferior lamina was localized under fluoroscopic visualization and the soft tissues overlying this structure were infiltrated with 4 ml. of 1% Lidocaine without Epinephrine. A # 20 gauge, Tuohy needle was inserted into the epidural space using a paramedian approach.  The epidural space was localized using loss of resistance along with contralateral oblique bi-planar fluoroscopic views.  After negative aspirate for air, blood, and CSF, a 2 ml. volume of Isovue-250 was injected into the epidural space and the flow of contrast was observed. Radiographs were obtained for documentation purposes.   The injectate was administered into the level noted above.  Additional Comments:  No complications occurred Dressing: 2 x 2 sterile gauze and Band-Aid    Post-procedure details: Patient was observed during the procedure. Post-procedure instructions were reviewed.  Patient  left the clinic in stable condition.

## 2023-08-23 NOTE — Progress Notes (Signed)
Tony Mcmillan - 82 y.o. male MRN [id number]  Date of birth: 10-16-1941  Office Visit Note: Visit Date: 08/21/2023 PCP: Tollie Eth, NP Referred by: Tollie Eth, NP  Subjective: Chief Complaint  Patient presents with   Neck - Pain    ESI   HPI:  Tony Mcmillan is a 82 y.o. male who comes in today at the request of Ellin Goodie, FNP and Dr. Annell Greening for planned Left C7-T1 Cervical Interlaminar epidural steroid injection with fluoroscopic guidance.  The patient has failed conservative care including home exercise, medications, time and activity modification.  This injection will be diagnostic and hopefully therapeutic.  Please see requesting physician notes for further details and justification.   ROS Otherwise per HPI.  Assessment & Plan: Visit Diagnoses:    ICD-10-CM   1. Radiculopathy, cervical region  M54.12 XR C-ARM NO REPORT    Epidural Steroid injection    methylPREDNISolone acetate (DEPO-MEDROL) injection 80 mg      Plan: No additional findings.   Meds & Orders:  Meds ordered this encounter  Medications   methylPREDNISolone acetate (DEPO-MEDROL) injection 80 mg    Orders Placed This Encounter  Procedures   XR C-ARM NO REPORT   Epidural Steroid injection    Follow-up: Return for visit to requesting provider as needed.   Procedures: No procedures performed  Cervical Epidural Steroid Injection - Interlaminar Approach with Fluoroscopic Guidance  Patient: Tony Mcmillan      Date of Birth: [birth year] MRN: [id number] PCP: Tollie Eth, NP      Visit Date: 08/21/2023   Universal Protocol:    Date/Time: 08/31/245:32 PM  Consent Given By: the patient  Position: PRONE  Additional Comments: Vital signs were monitored before and after the procedure. Patient was prepped and draped in the usual sterile fashion. The correct patient, procedure, and site was verified.   Injection Procedure Details:   Procedure diagnoses: Radiculopathy, cervical region [M54.12]     Meds Administered:  Meds ordered this encounter  Medications   methylPREDNISolone acetate (DEPO-MEDROL) injection 80 mg     Laterality: Left  Location/Site: C7-T1  Needle: 3.5 in., 20 ga. Tuohy  Needle Placement: Paramedian epidural space  Findings:  -Comments: Excellent flow of contrast into the epidural space.  Procedure Details: Using a paramedian approach from the side mentioned above, the region overlying the inferior lamina was localized under fluoroscopic visualization and the soft tissues overlying this structure were infiltrated with 4 ml. of 1% Lidocaine without Epinephrine. A # 20 gauge, Tuohy needle was inserted into the epidural space using a paramedian approach.  The epidural space was localized using loss of resistance along with contralateral oblique bi-planar fluoroscopic views.  After negative aspirate for air, blood, and CSF, a 2 ml. volume of Isovue-250 was injected into the epidural space and the flow of contrast was observed. Radiographs were obtained for documentation purposes.   The injectate was administered into the level noted above.  Additional Comments:  No complications occurred Dressing: 2 x 2 sterile gauze and Band-Aid    Post-procedure details: Patient was observed during the procedure. Post-procedure instructions were reviewed.  Patient left the clinic in stable condition.   Clinical History: Narrative & Impression [facility]   NEUROIMAGING REPORT     STUDY DATE: 07/01/23 PATIENT NAME: Tony Mcmillan DOB: [birth year] MRN: [id number]   ORDERING CLINICIAN: Anson Fret, MD  CLINICAL HISTORY: 82 y.o. year old male with:  1. Left hand pain  2. Cervical radiculopathy  3. Fall, initial encounter  4. Pain of neck with recent traumatic injury  5. Left arm numbness  6. Left arm weakness  7. Decreased ROM of neck  8. At high risk for fracture  9. Rheumatoid arthritis, involving unspecified site, unspecified whether  rheumatoid factor present (HCC)      EXAM: MR CERVICAL SPINE WO CONTRAST  TECHNIQUE: MRI of the cervical spine was obtained utilizing multiplanar, multiecho pulse sequences. CONTRAST:   Diagnostic Product Medications (last 72 hours)       None        COMPARISON: 03/22/23 CT   IMAGING SITE: [facility] AT [address] [place] [address]       FINDINGS:    On sagittal views the vertebral bodies have normal height and alignment.  Anterior cervical discectomy and fusion with metal hardware (anteriorly and posteriorly) spanning C2-C6 levels with interbody fusion device spanning C3-C5 vertebral bodies. 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  The posterior fossa, pituitary gland and paraspinal soft tissues are unremarkable.    On axial views: C2-3 no spinal stenosis or foraminal narrowing C3-4 no spinal stenosis or foraminal narrowing C4-5 no spinal stenosis or foraminal narrowing  C5-6 no spinal stenosis or foraminal narrowing C6-7 no spinal stenosis or foraminal narrowing C7-T1 disc bulging and uncovertebral joint hypertrophy with severe bilateral foraminal stenosis T1-2 disc bulging and facet hypertrophy with mild bilateral foraminal stenosis T2-3 no spinal stenosis or foraminal narrowing   Limited views of the soft tissues of the head and neck are unremarkable.     IMPRESSION:    MRI cervical spine (without) demonstrating: - Cervical discectomy and fusion spanning C2-C6 with anterior and posterior hardware and interbody fusion device from C3-C5 levels. - At C7-T1 disc bulging and uncovertebral joint hypertrophy with severe bilateral foraminal stenosis. - At T1-2 disc bulging and facet hypertrophy with mild bilateral foraminal stenosis.       INTERPRETING PHYSICIAN:  Suanne Marker, MD Certified in Neurology, Neurophysiology and Neuroimaging   [facility] [address] [place], [place] [address] [phone]     Objective:  VS:  HT:    WT:   BMI:     BP:122/70  HR:65bpm  TEMP: ( )  RESP:  Physical Exam Vitals and nursing note reviewed.  Constitutional:      General: He is not in acute distress.    Appearance: Normal appearance. He is not ill-appearing.  HENT:     Head: Normocephalic and atraumatic.     Right Ear: External ear normal.     Left Ear: External ear normal.  Eyes:     Extraocular Movements: Extraocular movements intact.  Cardiovascular:     Rate and Rhythm: Normal rate.     Pulses: Normal pulses.  Abdominal:     General: There is no distension.     Palpations: Abdomen is soft.  Musculoskeletal:        General: Tenderness present. No signs of injury.     Cervical back: Tenderness present. No rigidity.     Right lower leg: No edema.     Left lower leg: No edema.     Comments: Patient has good strength in the upper extremities with 5 out of 5 strength in wrist extension long finger flexion APB.  No intrinsic hand muscle atrophy.  Negative Hoffmann's test. Bilateral shoulder impingement.  Lymphadenopathy:     Cervical: No  cervical adenopathy.  Skin:    Findings: No erythema or rash.  Neurological:     General: No focal deficit present.     Mental Status: He is alert and oriented to person, place, and time.     Sensory: No sensory deficit.     Motor: No weakness or abnormal muscle tone.     Coordination: Coordination normal.  Psychiatric:        Mood and Affect: Mood normal.        Behavior: Behavior normal.      Imaging: No results found.

## 2023-09-02 ENCOUNTER — Other Ambulatory Visit: Payer: Self-pay

## 2023-09-02 ENCOUNTER — Telehealth: Payer: Self-pay

## 2023-09-02 ENCOUNTER — Inpatient Hospital Stay: Payer: Medicare HMO | Attending: Oncology

## 2023-09-02 ENCOUNTER — Inpatient Hospital Stay: Payer: Medicare HMO | Admitting: Nurse Practitioner

## 2023-09-02 ENCOUNTER — Encounter: Payer: Self-pay | Admitting: Nurse Practitioner

## 2023-09-02 VITALS — BP 139/59 | HR 91 | Temp 98.2°F | Resp 18 | Ht 62.0 in | Wt 129.0 lb

## 2023-09-02 DIAGNOSIS — J449 Chronic obstructive pulmonary disease, unspecified: Secondary | ICD-10-CM | POA: Insufficient documentation

## 2023-09-02 DIAGNOSIS — D500 Iron deficiency anemia secondary to blood loss (chronic): Secondary | ICD-10-CM

## 2023-09-02 DIAGNOSIS — M069 Rheumatoid arthritis, unspecified: Secondary | ICD-10-CM | POA: Insufficient documentation

## 2023-09-02 DIAGNOSIS — I2510 Atherosclerotic heart disease of native coronary artery without angina pectoris: Secondary | ICD-10-CM | POA: Insufficient documentation

## 2023-09-02 DIAGNOSIS — D509 Iron deficiency anemia, unspecified: Secondary | ICD-10-CM | POA: Diagnosis present

## 2023-09-02 LAB — CBC WITH DIFFERENTIAL (CANCER CENTER ONLY)
Abs Immature Granulocytes: 0 10*3/uL (ref 0.00–0.07)
Basophils Absolute: 0 10*3/uL (ref 0.0–0.1)
Basophils Relative: 1 %
Eosinophils Absolute: 0.1 10*3/uL (ref 0.0–0.5)
Eosinophils Relative: 2 %
HCT: 39.2 % (ref 39.0–52.0)
Hemoglobin: 13.1 g/dL (ref 13.0–17.0)
Immature Granulocytes: 0 %
Lymphocytes Relative: 20 %
Lymphs Abs: 0.9 10*3/uL (ref 0.7–4.0)
MCH: 28.7 pg (ref 26.0–34.0)
MCHC: 33.4 g/dL (ref 30.0–36.0)
MCV: 85.8 fL (ref 80.0–100.0)
Monocytes Absolute: 0.6 10*3/uL (ref 0.1–1.0)
Monocytes Relative: 15 %
Neutro Abs: 2.7 10*3/uL (ref 1.7–7.7)
Neutrophils Relative %: 62 %
Platelet Count: 313 10*3/uL (ref 150–400)
RBC: 4.57 MIL/uL (ref 4.22–5.81)
RDW: 15.4 % (ref 11.5–15.5)
WBC Count: 4.3 10*3/uL (ref 4.0–10.5)
nRBC: 0 % (ref 0.0–0.2)

## 2023-09-02 LAB — FERRITIN: Ferritin: 30 ng/mL (ref 24–336)

## 2023-09-02 NOTE — Telephone Encounter (Signed)
-----   Message from Lonna Cobb sent at 09/02/2023  2:25 PM EDT ----- Please let him know ferritin is in low normal range.  Follow-up as scheduled.

## 2023-09-02 NOTE — Progress Notes (Signed)
  [facility] OFFICE PROGRESS NOTE   Diagnosis: Iron deficiency  INTERVAL HISTORY:   [name] returns as scheduled.  Overall he is feeling better.  He denies shortness of breath.  No bleeding.  He notes improvement in left neck pain since undergoing a steroid injection.  Objective:  Vital signs in last 24 hours:  Blood pressure (!) 139/59, pulse 91, temperature 98.2 F (36.8 C), temperature source Oral, resp. rate 18, height 5\' 2"  (1.575 m), weight 129 lb (58.5 kg), SpO2 94%.     Resp: Lungs clear bilaterally. Cardio: Regular rate and rhythm. GI: No hepatosplenomegaly. Vascular: No leg edema.     Lab Results:  Lab Results  Component Value Date   WBC 4.3 09/02/2023   HGB 13.1 09/02/2023   HCT 39.2 09/02/2023   MCV 85.8 09/02/2023   PLT 313 09/02/2023   NEUTROABS 2.7 09/02/2023    Imaging:  No results found.  Medications: I have reviewed the patient's current medications.  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 Wenzinger is stable from a hematologic standpoint.  Hemoglobin has corrected into normal range following IV iron.  We will follow-up on the ferritin from today.  He will return for CBC and ferritin in 3 months, follow-up in 6 months.    Slater Gettler ANP/GNP-BC   09/02/2023  1:34 PM

## 2023-09-02 NOTE — Telephone Encounter (Signed)
Patient gave verbal understanding had no further questions or concerns. 

## 2023-09-09 ENCOUNTER — Telehealth: Payer: Self-pay | Admitting: Physical Medicine and Rehabilitation

## 2023-09-09 NOTE — Telephone Encounter (Signed)
Pt is stating his neck is hurting severely and would like an appt ASAP please advise

## 2023-09-10 NOTE — Telephone Encounter (Signed)
Worked in for [employer]

## 2023-09-10 NOTE — Telephone Encounter (Signed)
Can I work him in sooner to see you? Your next available isnt until oct 1st

## 2023-09-12 ENCOUNTER — Ambulatory Visit (INDEPENDENT_AMBULATORY_CARE_PROVIDER_SITE_OTHER): Payer: Medicare HMO | Admitting: Orthopaedic Surgery

## 2023-09-12 DIAGNOSIS — G5602 Carpal tunnel syndrome, left upper limb: Secondary | ICD-10-CM

## 2023-09-12 DIAGNOSIS — G5622 Lesion of ulnar nerve, left upper limb: Secondary | ICD-10-CM

## 2023-09-15 DIAGNOSIS — G5622 Lesion of ulnar nerve, left upper limb: Secondary | ICD-10-CM | POA: Insufficient documentation

## 2023-09-15 DIAGNOSIS — G5602 Carpal tunnel syndrome, left upper limb: Secondary | ICD-10-CM | POA: Insufficient documentation

## 2023-09-15 NOTE — Progress Notes (Signed)
Office Visit Note   Patient: Tony Mcmillan           Date of Birth: January 18, 1941           MRN: [id number] Visit Date: 09/12/2023              Requested by: Tollie Eth, NP [address] [place],  [place] [address] PCP: Tollie Eth, NP   Assessment & Plan: Visit Diagnoses:  1. Carpal tunnel syndrome, left upper limb   2. Cubital tunnel syndrome on left     Plan: Discussed with patient plan would be carpal tunnel and cubital tunnel release as an outpatient.  We discussed this will not change some of the neck arthritis he has nor changed the left occipital neuralgia that he had.  Severe pain and numbness he is having radiating into his left hand persistently.  We reviewed MRI scan and also electrical test I gave him a copy of the test.  Plan to be outpatient release of both the ulnar nerve at the elbow and carpal tunnel.  He understands he have to have a ride someone to drive him back and forth to the patient surgery center.  Questions were elicited and answered he understands and request to proceed.  Follow-Up Instructions: No follow-ups on file.   Orders:  No orders of the defined types were placed in this encounter.  No orders of the defined types were placed in this encounter.     Procedures: No procedures performed   Clinical Data: No additional findings.   Subjective: Chief Complaint  Patient presents with   Neck - Follow-up    HPI 82 year old male returns he had recent epidural cervical spine which helped slightly.  He continues to have principal problem with numbness in his left hand and has rheumatoid arthritis.  Electrical test shows severe cubital tunnel and severe carpal tunnel on the left.  In addition he has some cervical radiculopathy and some less severe right cubital tunnel.  Left hand gives him the most trouble with full hand numbness he drops objects has problems using his hand with weakness.  Problems with gripping.  Review of Systems additional  history of rheumatoid arthritis aortic valve calcification coronary artery disease COPD.  He has had ongoing problems with occipital neuralgia of the left side of the head.   Objective: Vital Signs: There were no vitals taken for this visit.  Physical Exam Constitutional:      Appearance: He is well-developed.  HENT:     Head: Normocephalic and atraumatic.     Right Ear: External ear normal.     Left Ear: External ear normal.  Eyes:     Pupils: Pupils are equal, round, and reactive to light.  Neck:     Thyroid: No thyromegaly.     Trachea: No tracheal deviation.  Cardiovascular:     Rate and Rhythm: Normal rate.  Pulmonary:     Effort: Pulmonary effort is normal.     Breath sounds: No wheezing.  Abdominal:     General: Bowel sounds are normal.     Palpations: Abdomen is soft.  Musculoskeletal:     Cervical back: Neck supple.  Skin:    General: Skin is warm and dry.     Capillary Refill: Capillary refill takes less than 2 seconds.  Neurological:     Mental Status: He is alert and oriented to person, place, and time.  Psychiatric:        Behavior: Behavior normal.  Office Visit Note   Patient: Tony Mcmillan           Date of Birth: January 18, 1941           MRN: [id number] Visit Date: 09/12/2023              Requested by: Tollie Eth, NP [address] [place],  [place] [address] PCP: Tollie Eth, NP   Assessment & Plan: Visit Diagnoses:  1. Carpal tunnel syndrome, left upper limb   2. Cubital tunnel syndrome on left     Plan: Discussed with patient plan would be carpal tunnel and cubital tunnel release as an outpatient.  We discussed this will not change some of the neck arthritis he has nor changed the left occipital neuralgia that he had.  Severe pain and numbness he is having radiating into his left hand persistently.  We reviewed MRI scan and also electrical test I gave him a copy of the test.  Plan to be outpatient release of both the ulnar nerve at the elbow and carpal tunnel.  He understands he have to have a ride someone to drive him back and forth to the patient surgery center.  Questions were elicited and answered he understands and request to proceed.  Follow-Up Instructions: No follow-ups on file.   Orders:  No orders of the defined types were placed in this encounter.  No orders of the defined types were placed in this encounter.     Procedures: No procedures performed   Clinical Data: No additional findings.   Subjective: Chief Complaint  Patient presents with   Neck - Follow-up    HPI 82 year old male returns he had recent epidural cervical spine which helped slightly.  He continues to have principal problem with numbness in his left hand and has rheumatoid arthritis.  Electrical test shows severe cubital tunnel and severe carpal tunnel on the left.  In addition he has some cervical radiculopathy and some less severe right cubital tunnel.  Left hand gives him the most trouble with full hand numbness he drops objects has problems using his hand with weakness.  Problems with gripping.  Review of Systems additional  history of rheumatoid arthritis aortic valve calcification coronary artery disease COPD.  He has had ongoing problems with occipital neuralgia of the left side of the head.   Objective: Vital Signs: There were no vitals taken for this visit.  Physical Exam Constitutional:      Appearance: He is well-developed.  HENT:     Head: Normocephalic and atraumatic.     Right Ear: External ear normal.     Left Ear: External ear normal.  Eyes:     Pupils: Pupils are equal, round, and reactive to light.  Neck:     Thyroid: No thyromegaly.     Trachea: No tracheal deviation.  Cardiovascular:     Rate and Rhythm: Normal rate.  Pulmonary:     Effort: Pulmonary effort is normal.     Breath sounds: No wheezing.  Abdominal:     General: Bowel sounds are normal.     Palpations: Abdomen is soft.  Musculoskeletal:     Cervical back: Neck supple.  Skin:    General: Skin is warm and dry.     Capillary Refill: Capillary refill takes less than 2 seconds.  Neurological:     Mental Status: He is alert and oriented to person, place, and time.  Psychiatric:        Behavior: Behavior normal.  Thought Content: Thought content normal.        Judgment: Judgment normal.     Ortho Exam positive Tinel's left carpal tunnel and cubital tunnel.  Some thenar hypothenar atrophy.  Decreased 2 point left hand all digits.  Specialty Comments:  Narrative & Impression [facility]   NEUROIMAGING REPORT     STUDY DATE: 07/01/23 PATIENT NAME: Daquan Rhoton DOB: 11-Mar-1941 MRN: [id number]   ORDERING CLINICIAN: Anson Fret, MD  CLINICAL HISTORY: 82 y.o. year old male with:  1. Left hand pain  2. Cervical radiculopathy  3. Fall, initial encounter  4. Pain of neck with recent traumatic injury  5. Left arm numbness  6. Left arm weakness  7. Decreased ROM of neck  8. At high risk for fracture  9. Rheumatoid arthritis, involving unspecified site, unspecified whether rheumatoid factor  present (HCC)      EXAM: MR CERVICAL SPINE WO CONTRAST  TECHNIQUE: MRI of the cervical spine was obtained utilizing multiplanar, multiecho pulse sequences. CONTRAST:   Diagnostic Product Medications (last 72 hours)       None        COMPARISON: 03/22/23 CT   IMAGING SITE: [facility] AT [address] [place] [address]       FINDINGS:    On sagittal views the vertebral bodies have normal height and alignment.  Anterior cervical discectomy and fusion with metal hardware (anteriorly and posteriorly) spanning C2-C6 levels with interbody fusion device spanning C3-C5 vertebral bodies. 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  The posterior fossa, pituitary gland and paraspinal soft tissues are unremarkable.    On axial views: C2-3 no spinal stenosis or foraminal narrowing C3-4 no spinal stenosis or foraminal narrowing C4-5 no spinal stenosis or foraminal narrowing  C5-6 no spinal stenosis or foraminal narrowing C6-7 no spinal stenosis or foraminal narrowing C7-T1 disc bulging and uncovertebral joint hypertrophy with severe bilateral foraminal stenosis T1-2 disc bulging and facet hypertrophy with mild bilateral foraminal stenosis T2-3 no spinal stenosis or foraminal narrowing   Limited views of the soft tissues of the head and neck are unremarkable.     IMPRESSION:    MRI cervical spine (without) demonstrating: - Cervical discectomy and fusion spanning C2-C6 with anterior and posterior hardware and interbody fusion device from C3-C5 levels. - At C7-T1 disc bulging and uncovertebral joint hypertrophy with severe bilateral foraminal stenosis. - At T1-2 disc bulging and facet hypertrophy with mild bilateral foraminal stenosis.       INTERPRETING PHYSICIAN:  Suanne Marker, MD Certified in Neurology, Neurophysiology and Neuroimaging   [facility] [address], [address] [place], [place] [address] [phone]  Imaging: No results found.   PMFS History: Patient Active Problem List   Diagnosis Date Noted   Carpal tunnel syndrome, left upper limb 09/15/2023   Cubital tunnel syndrome on left 09/15/2023   Iron deficiency anemia due to chronic blood loss 06/02/2023   Occipital neuralgia of left side 03/26/2023   COPD (chronic obstructive pulmonary disease) (HCC) 08/08/2022   Olecranon bursitis of right elbow 05/24/2022   Other long term (current) drug therapy 05/09/2022   Pulmonary fibrosis (HCC) 05/09/2022   Absolute anemia 05/09/2022   Mixed hyperlipidemia 01/21/2022   Premature atrial contractions 01/20/2022   CAD (coronary artery disease)    Enlarged prostate 03/10/2020   Aortic valve calcification 03/10/2020   Bladder wall thickening 03/10/2020   Weight loss  Thought Content: Thought content normal.        Judgment: Judgment normal.     Ortho Exam positive Tinel's left carpal tunnel and cubital tunnel.  Some thenar hypothenar atrophy.  Decreased 2 point left hand all digits.  Specialty Comments:  Narrative & Impression [facility]   NEUROIMAGING REPORT     STUDY DATE: 07/01/23 PATIENT NAME: Daquan Rhoton DOB: 11-Mar-1941 MRN: [id number]   ORDERING CLINICIAN: Anson Fret, MD  CLINICAL HISTORY: 82 y.o. year old male with:  1. Left hand pain  2. Cervical radiculopathy  3. Fall, initial encounter  4. Pain of neck with recent traumatic injury  5. Left arm numbness  6. Left arm weakness  7. Decreased ROM of neck  8. At high risk for fracture  9. Rheumatoid arthritis, involving unspecified site, unspecified whether rheumatoid factor  present (HCC)      EXAM: MR CERVICAL SPINE WO CONTRAST  TECHNIQUE: MRI of the cervical spine was obtained utilizing multiplanar, multiecho pulse sequences. CONTRAST:   Diagnostic Product Medications (last 72 hours)       None        COMPARISON: 03/22/23 CT   IMAGING SITE: [facility] AT [address] [place] [address]       FINDINGS:    On sagittal views the vertebral bodies have normal height and alignment.  Anterior cervical discectomy and fusion with metal hardware (anteriorly and posteriorly) spanning C2-C6 levels with interbody fusion device spanning C3-C5 vertebral bodies. 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  The posterior fossa, pituitary gland and paraspinal soft tissues are unremarkable.    On axial views: C2-3 no spinal stenosis or foraminal narrowing C3-4 no spinal stenosis or foraminal narrowing C4-5 no spinal stenosis or foraminal narrowing  C5-6 no spinal stenosis or foraminal narrowing C6-7 no spinal stenosis or foraminal narrowing C7-T1 disc bulging and uncovertebral joint hypertrophy with severe bilateral foraminal stenosis T1-2 disc bulging and facet hypertrophy with mild bilateral foraminal stenosis T2-3 no spinal stenosis or foraminal narrowing   Limited views of the soft tissues of the head and neck are unremarkable.     IMPRESSION:    MRI cervical spine (without) demonstrating: - Cervical discectomy and fusion spanning C2-C6 with anterior and posterior hardware and interbody fusion device from C3-C5 levels. - At C7-T1 disc bulging and uncovertebral joint hypertrophy with severe bilateral foraminal stenosis. - At T1-2 disc bulging and facet hypertrophy with mild bilateral foraminal stenosis.       INTERPRETING PHYSICIAN:  Suanne Marker, MD Certified in Neurology, Neurophysiology and Neuroimaging   [facility] [address], [address] [place], [place] [address] [phone]  Imaging: No results found.   PMFS History: Patient Active Problem List   Diagnosis Date Noted   Carpal tunnel syndrome, left upper limb 09/15/2023   Cubital tunnel syndrome on left 09/15/2023   Iron deficiency anemia due to chronic blood loss 06/02/2023   Occipital neuralgia of left side 03/26/2023   COPD (chronic obstructive pulmonary disease) (HCC) 08/08/2022   Olecranon bursitis of right elbow 05/24/2022   Other long term (current) drug therapy 05/09/2022   Pulmonary fibrosis (HCC) 05/09/2022   Absolute anemia 05/09/2022   Mixed hyperlipidemia 01/21/2022   Premature atrial contractions 01/20/2022   CAD (coronary artery disease)    Enlarged prostate 03/10/2020   Aortic valve calcification 03/10/2020   Bladder wall thickening 03/10/2020   Weight loss

## 2023-09-29 ENCOUNTER — Other Ambulatory Visit: Payer: Self-pay | Admitting: Orthopaedic Surgery

## 2023-09-29 DIAGNOSIS — G5622 Lesion of ulnar nerve, left upper limb: Secondary | ICD-10-CM | POA: Diagnosis not present

## 2023-09-29 DIAGNOSIS — G5602 Carpal tunnel syndrome, left upper limb: Secondary | ICD-10-CM | POA: Diagnosis not present

## 2023-09-29 HISTORY — PX: CARPAL TUNNEL RELEASE: SHX101

## 2023-09-29 MED ORDER — HYDROCODONE-ACETAMINOPHEN 5-325 MG PO TABS: 1.0000 | ORAL_TABLET | Freq: Four times a day (QID) | ORAL | 0 refills | Status: DC | PRN

## 2023-10-07 ENCOUNTER — Ambulatory Visit: Payer: Medicare HMO | Admitting: Orthopaedic Surgery

## 2023-10-07 VITALS — BP 144/71 | HR 60

## 2023-10-07 DIAGNOSIS — G5622 Lesion of ulnar nerve, left upper limb: Secondary | ICD-10-CM

## 2023-10-07 DIAGNOSIS — G5602 Carpal tunnel syndrome, left upper limb: Secondary | ICD-10-CM

## 2023-10-07 NOTE — Progress Notes (Signed)
Post-Op Visit Note   Patient: Tony Mcmillan           Date of Birth: [birth year]           MRN: [id number] Visit Date: 10/07/2023 PCP: Tollie Eth, NP   Assessment & Plan:Postop left carpal tunnel release left cubital tunnel release.  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  Both incisions look good wrist 1 applied he will return in 1 week for carpal tunnel and ulnar nerve at the elbow suture removal.  Chief Complaint:  Chief Complaint  Patient presents with   Left Wrist - Routine Post Op   Left Elbow - Routine Post Op   Visit Diagnoses:  1. Carpal tunnel syndrome, left upper limb   2. Cubital tunnel syndrome on left     Plan: Return 1 week for suture removal left hand and elbow.  Follow-Up Instructions: No follow-ups on file.   Orders:  No orders of the defined types were placed in this encounter.  No orders of the defined types were placed in this encounter.   Imaging: No results found.  PMFS History: Patient Active Problem List   Diagnosis Date Noted   Carpal tunnel syndrome, left upper limb 09/15/2023   Cubital tunnel syndrome on left 09/15/2023   Iron deficiency anemia due to chronic blood loss 06/02/2023   Occipital neuralgia of left side 03/26/2023   COPD (chronic obstructive pulmonary disease) (HCC) 08/08/2022   Olecranon bursitis of right elbow 05/24/2022   Other long term (current) drug therapy 05/09/2022   Pulmonary fibrosis (HCC) 05/09/2022   Absolute anemia 05/09/2022   Mixed hyperlipidemia 01/21/2022   Premature atrial contractions 01/20/2022   CAD (coronary artery disease)    Enlarged prostate 03/10/2020   Aortic valve calcification 03/10/2020   Bladder wall thickening 03/10/2020   Weight loss 11/15/2019   Bilateral carotid bruits 02/09/2019   History of systemic steroid therapy 10/30/2017    Pulmonary emphysema (HCC) 12/18/2016   Rheumatoid arthritis (HCC) 11/04/2016   High risk medication use 11/04/2016   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   Vitamin D deficiency 11/04/2016   Asymptomatic microscopic hematuria 04/03/2016   Benign prostatic hyperplasia 04/03/2016   Renal cyst, left 04/03/2016   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 Sherryl [facility].     Atypical chest pain    a. 09/2016 MV: EF 63%, normal perfusion.   Benign prostatic hyperplasia 04/03/2016   Asymptomatic. Normal PSA and exam by Dr. Sherryl [facility] 01/2016.     Bilateral carotid bruits 02/09/2019   Bladder wall thickening 03/10/2020   CAD (coronary artery disease)    Coronary artery calcification on Chest CT in 09/2016 and 01/2019 // Myoview 10/17: normal perfusion // Myoview 01/2019: no ischemia or infarction, EF 58 // Coronary CTA 03/2020: Calcium Score 1067; non-obstructive CAD [oLM 1-24, LAD ost 25049, mid and dist 1-24; D1 1-24; LCx prox and mid 1-24, OM1 and OM2 25-49, RCA and PLB 1-24; PLA 1-24]    Carotid bruit  Carotid Dopplers 01/2019: no ICA stenosis bilaterally    Chronic wrist pain 07/02/2016   Colonic polyp 02/23/2016   COVID-19    DDD (degenerative disc disease), 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 L4-5 and L5-S1    Diastolic dysfunction    a. 2015 Echo: EF >55%;  b. 09/2016 Echo: EF 55-60%, no rwma, Gr1 DD, triv AI/MR, mild TR, PASP .   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   On CT 01/2016    Palpitations    Echo 10/17: Mild focal basal septal hypertrophy, EF 55-60, no RWMA, Gr 1 DD, trivial MR, normal RVSF, mild TR, PASP 28 // Event monitor 01/2019: NSR, PACs   Prediabetes 10/29/2018   Pulmonary emphysema (HCC) 12/18/2016   Renal cyst, left 04/03/2016   On CT, benign    Rheumatoid arthritis (HCC)    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   -RF,-CCP,-[id number], erosive disease with contractures   Tubular adenoma of colon 01/2016   Vitamin D deficiency 11/04/2016    Family History  Problem Relation Age of Onset   Hypertension Mother    Hypertension Father    Diabetes Father    Heart disease Father        stents   Hyperlipidemia Sister    Diabetes Sister    Stroke Brother    Diabetes Brother    Heart disease Brother        stents   Colon polyps Neg Hx    Esophageal cancer Neg Hx    Stomach cancer Neg Hx    Rectal cancer Neg Hx    Colon cancer Neg Hx     Past Surgical History:  Procedure Laterality Date   ANTERIOR CERVICAL CORPECTOMY     ANTERIOR CERVICAL CORPECTOMY  12/2014   for infection. this was in [place]   CATARACT EXTRACTION     both eyes   COLONOSCOPY     COLOSTOMY REVERSAL     had infection on buttocks and had to have a skin graft- had colostomy to help area stay clean and heal   HERNIA REPAIR     POLYPECTOMY     SPINE SURGERY     Social History   Occupational History   Not on file  Tobacco Use   Smoking status: Former    Current packs/day: 0.00    Average packs/day: 1 pack/day for 17.0 years (17.0 ttl pk-yrs)    Types: Cigarettes    Start date: 12/23/1976    Quit date: 12/23/1993    Years since  quitting: 29.8   Smokeless tobacco: Never  Vaping Use   Vaping status: Never Used  Substance and Sexual Activity   Alcohol use: No    Alcohol/week: 0.0 standard drinks of alcohol   Drug use: No   Sexual activity: Yes

## 2023-10-14 ENCOUNTER — Encounter: Payer: Self-pay | Admitting: Orthopaedic Surgery

## 2023-10-14 ENCOUNTER — Ambulatory Visit (INDEPENDENT_AMBULATORY_CARE_PROVIDER_SITE_OTHER): Payer: Medicare HMO | Admitting: Orthopaedic Surgery

## 2023-10-14 VITALS — BP 130/70 | HR 79 | Ht 62.0 in | Wt 131.0 lb

## 2023-10-14 DIAGNOSIS — G5622 Lesion of ulnar nerve, left upper limb: Secondary | ICD-10-CM | POA: Diagnosis not present

## 2023-10-14 MED ORDER — TRAMADOL HCL 50 MG PO TABS: 50.0000 mg | ORAL_TABLET | Freq: Two times a day (BID) | ORAL | 0 refills | Status: DC | PRN

## 2023-10-14 NOTE — Progress Notes (Unsigned)
Post-Op Visit Note   Patient: Tony Mcmillan           Date of Birth: 1941-07-23           MRN: [id number] Visit Date: 10/14/2023 PCP: Tollie Eth, NP   Assessment & Plan:  Chief Complaint:  Chief Complaint  Patient presents with   Left Hand - Routine Post Op    09/28/2022 Left CTR, left cubital tunnel release   Left Elbow - Routine Post Op    09/28/2022 Left CTR, left cubital tunnel release   Visit Diagnoses: No diagnosis found.  Plan: ***  Follow-Up Instructions: No follow-ups on file.   Orders:  No orders of the defined types were placed in this encounter.  No orders of the defined types were placed in this encounter.   Imaging: No results found.  PMFS History: Patient Active Problem List   Diagnosis Date Noted   Carpal tunnel syndrome, left upper limb 09/15/2023   Cubital tunnel syndrome on left 09/15/2023   Iron deficiency anemia due to chronic blood loss 06/02/2023   Occipital neuralgia of left side 03/26/2023   COPD (chronic obstructive pulmonary disease) (HCC) 08/08/2022   Olecranon bursitis of right elbow 05/24/2022   Other long term (current) drug therapy 05/09/2022   Pulmonary fibrosis (HCC) 05/09/2022   Absolute anemia 05/09/2022   Mixed hyperlipidemia 01/21/2022   Premature atrial contractions 01/20/2022   CAD (coronary artery disease)    Enlarged prostate 03/10/2020   Aortic valve calcification 03/10/2020   Bladder wall thickening 03/10/2020   Weight loss 11/15/2019   Bilateral carotid bruits 02/09/2019   History of systemic steroid therapy 10/30/2017   Pulmonary emphysema (HCC) 12/18/2016   Rheumatoid arthritis (HCC) 11/04/2016   High risk medication use 11/04/2016   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   Vitamin D deficiency 11/04/2016   Asymptomatic microscopic hematuria 04/03/2016   Benign prostatic hyperplasia 04/03/2016   Renal cyst, left 04/03/2016   Osteoarthritis of lumbar spine 04/03/2016   DDD (degenerative disc  disease), lumbar 04/03/2016   Duodenal ulcer 02/23/2016   Past Medical History:  Diagnosis Date   3-vessel coronary artery disease 03/10/2020   Allergy    Anemia    Aortic valve calcification 03/10/2020   Arthritis    Asymptomatic microscopic hematuria 04/03/2016   Cystoscopy 02/207 [facility].     Atypical chest pain    a. 09/2016 MV: EF 63%, normal perfusion.   Benign prostatic hyperplasia 04/03/2016   Asymptomatic. Normal PSA and exam by Dr. [facility] 01/2016.     Bilateral carotid bruits 02/09/2019   Bladder wall thickening 03/10/2020   CAD (coronary artery disease)    Coronary artery calcification on Chest CT in 09/2016 and 01/2019 // Myoview 10/17: normal perfusion // Myoview 01/2019: no ischemia or infarction, EF 58 // Coronary CTA 03/2020: Calcium Score 1067; non-obstructive CAD [oLM 1-24, LAD ost 25049, mid and dist 1-24; D1 1-24; LCx prox and mid 1-24, OM1 and OM2 25-49, RCA and PLB 1-24; PLA 1-24]    Carotid bruit    Carotid Dopplers 01/2019: no ICA stenosis bilaterally    Chronic wrist pain 07/02/2016   Colonic polyp 02/23/2016   COVID-19    DDD (degenerative disc disease), 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 L4-5 and L5-S1    Diastolic dysfunction    a. 2015 Echo: EF >55%;  b. 09/2016 Echo: EF 55-60%, no rwma, Gr1 DD, triv AI/MR, mild TR, PASP .   DJD (degenerative  joint disease), cervical 11/04/2016   Duodenal ulcer 02/23/2016   Emphysema of lung (HCC) 02/08/2016   Enlarged prostate 03/10/2020   Fall 03/26/2023   GERD (gastroesophageal reflux disease)    History of colonic polyps 07/02/2016   History of gastric ulcer 07/02/2016   History of systemic steroid therapy 10/30/2017   Hx of Blood Transfusion    Hyperlipidemia    Intermittent palpitations 01/29/2019   Left upper arm pain 01/12/2023   Osteoarthritis of lumbar spine 04/03/2016   On CT 01/2016    Palpitations    Echo 10/17: Mild focal basal septal hypertrophy, EF 55-60,  no RWMA, Gr 1 DD, trivial MR, normal RVSF, mild TR, PASP 28 // Event monitor 01/2019: NSR, PACs   Prediabetes 10/29/2018   Pulmonary emphysema (HCC) 12/18/2016   Renal cyst, left 04/03/2016   On CT, benign    Rheumatoid arthritis (HCC)    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   -RF,-CCP,-1433eta, erosive disease with contractures   Tubular adenoma of colon 01/2016   Vitamin D deficiency 11/04/2016    Family History  Problem Relation Age of Onset   Hypertension Mother    Hypertension Father    Diabetes Father    Heart disease Father        stents   Hyperlipidemia Sister    Diabetes Sister    Stroke Brother    Diabetes Brother    Heart disease Brother        stents   Colon polyps Neg Hx    Esophageal cancer Neg Hx    Stomach cancer Neg Hx    Rectal cancer Neg Hx    Colon cancer Neg Hx     Past Surgical History:  Procedure Laterality Date   ANTERIOR CERVICAL CORPECTOMY     ANTERIOR CERVICAL CORPECTOMY  12/2014   for infection. this was in [place]   CATARACT EXTRACTION     both eyes   COLONOSCOPY     COLOSTOMY REVERSAL     had infection on buttocks and had to have a skin graft- had colostomy to help area stay clean and heal   HERNIA REPAIR     POLYPECTOMY     SPINE SURGERY     Social History   Occupational History   Not on file  Tobacco Use   Smoking status: Former    Current packs/day: 0.00    Average packs/day: 1 pack/day for 17.0 years (17.0 ttl pk-yrs)    Types: Cigarettes    Start date: 12/23/1976    Quit date: 12/23/1993    Years since quitting: 29.8   Smokeless tobacco: Never  Vaping Use   Vaping status: Never Used  Substance and Sexual Activity   Alcohol use: No    Alcohol/week: 0.0 standard drinks of alcohol   Drug use: No   Sexual activity: Yes

## 2023-10-27 ENCOUNTER — Other Ambulatory Visit: Payer: Self-pay | Admitting: Physician Assistant

## 2023-11-11 ENCOUNTER — Encounter: Payer: Self-pay | Admitting: Orthopaedic Surgery

## 2023-11-11 ENCOUNTER — Ambulatory Visit (INDEPENDENT_AMBULATORY_CARE_PROVIDER_SITE_OTHER): Payer: Medicare HMO | Admitting: Orthopaedic Surgery

## 2023-11-11 VITALS — BP 134/79 | HR 80 | Ht 62.0 in | Wt 131.0 lb

## 2023-11-11 DIAGNOSIS — G5602 Carpal tunnel syndrome, left upper limb: Secondary | ICD-10-CM

## 2023-11-11 NOTE — Progress Notes (Signed)
Post-Op Visit Note   Patient: Tony Mcmillan           Date of Birth: Jan 25, 1941           MRN: [id number] Visit Date: 11/11/2023 PCP: Tollie Eth, NP   Assessment & Plan: Postop left cubital tunnel and left carpal tunnel release for previous positive lexical test.  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  He continues to have some problems with numbness into his left radial 3 fingers and dorsum of the hand between the thumb and index finger and we reviewed his MRI scan again showing the area of permanent cord damage.  He got some improvement from the peripheral nerve decompression that was done scars are well-healed we will release him from care and check him back on an as-needed basis.  Again we went over with him that he has some permanent damage to the cord that required surgery that was done in [place] and this is never going to change.  He has had improvement from carpal tunnel release as well as cubital tunnel release which showed positive electrical test with swelling and compression.  Follow-up as needed.  Chief Complaint:  Chief Complaint  Patient presents with   Left Hand - Follow-up, Routine Post Op    09/29/2023 left CTR, left cubital tunnel release   Left Elbow - Follow-up, Routine Post Op    09/29/2023 left CTR, left cubital tunnel release   Visit Diagnoses:  1. Carpal tunnel syndrome, left upper limb     Plan: Return as needed.  Follow-Up Instructions: No follow-ups on file.   Orders:  No orders of the defined types were placed in this encounter.  No orders of the defined types were placed in this encounter.   Imaging: No results found.  PMFS History: Patient Active Problem List   Diagnosis Date Noted   Carpal tunnel syndrome, left upper limb 09/15/2023   Cubital tunnel syndrome on left 09/15/2023   Iron deficiency anemia due to chronic blood loss 06/02/2023   Occipital  neuralgia of left side 03/26/2023   COPD (chronic obstructive pulmonary disease) (HCC) 08/08/2022   Olecranon bursitis of right elbow 05/24/2022   Other long term (current) drug therapy 05/09/2022   Pulmonary fibrosis (HCC) 05/09/2022   Absolute anemia 05/09/2022   Mixed hyperlipidemia 01/21/2022   Premature atrial contractions 01/20/2022   CAD (coronary artery disease)    Enlarged prostate 03/10/2020   Aortic valve calcification 03/10/2020   Bladder wall thickening 03/10/2020   Weight loss 11/15/2019   Bilateral carotid bruits 02/09/2019   History of systemic steroid therapy 10/30/2017   Pulmonary emphysema (HCC) 12/18/2016   Rheumatoid arthritis (HCC) 11/04/2016   High risk medication use 11/04/2016   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   Vitamin D deficiency 11/04/2016   Asymptomatic microscopic hematuria 04/03/2016   Benign prostatic hyperplasia 04/03/2016   Renal cyst, left 04/03/2016   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.     Atypical chest pain    a. 09/2016 MV: EF 63%, normal  perfusion.   Benign prostatic hyperplasia 04/03/2016   Asymptomatic. Normal PSA and exam by Dr. [facility] 01/2016.     Bilateral carotid bruits 02/09/2019   Bladder wall thickening 03/10/2020   CAD (coronary artery disease)    Coronary artery calcification on Chest CT in 09/2016 and 01/2019 // Myoview 10/17: normal perfusion // Myoview 01/2019: no ischemia or infarction, EF 58 // Coronary CTA 03/2020: Calcium Score 1067; non-obstructive CAD [oLM 1-24, LAD ost 25049, mid and dist 1-24; D1 1-24; LCx prox and mid 1-24, OM1 and OM2  25-49, RCA and PLB 1-24; PLA 1-24]    Carotid bruit    Carotid Dopplers 01/2019: no ICA stenosis bilaterally    Chronic wrist pain 07/02/2016   Colonic polyp 02/23/2016   COVID-19    DDD (degenerative disc disease), 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 L4-5 and L5-S1    Diastolic dysfunction    a. 2015 Echo: EF >55%;  b. 09/2016 Echo: EF 55-60%, no rwma, Gr1 DD, triv AI/MR, mild TR, PASP .   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   On CT 01/2016    Palpitations    Echo 10/17: Mild focal basal septal hypertrophy, EF 55-60, no RWMA, Gr 1 DD, trivial MR, normal RVSF, mild TR, PASP 28 // Event monitor 01/2019: NSR, PACs   Prediabetes 10/29/2018   Pulmonary emphysema (HCC) 12/18/2016   Renal cyst, left 04/03/2016   On CT, benign    Rheumatoid arthritis (HCC)    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   -RF,-CCP,-1433eta, erosive disease with contractures   Tubular adenoma of colon 01/2016   Vitamin D deficiency 11/04/2016    Family History  Problem Relation Age of Onset   Hypertension Mother    Hypertension Father    Diabetes Father    Heart disease Father        stents   Hyperlipidemia Sister    Diabetes Sister    Stroke Brother    Diabetes Brother    Heart disease Brother        stents   Colon polyps Neg Hx    Esophageal cancer Neg Hx    Stomach cancer Neg Hx    Rectal cancer Neg Hx    Colon cancer Neg Hx     Past Surgical History:  Procedure Laterality Date   ANTERIOR CERVICAL CORPECTOMY      ANTERIOR CERVICAL CORPECTOMY  12/2014   for infection. this was in [place]   CATARACT EXTRACTION     both eyes   COLONOSCOPY     COLOSTOMY REVERSAL     had infection on buttocks and had to have a skin graft- had colostomy to help area stay clean and heal   HERNIA REPAIR     POLYPECTOMY     SPINE SURGERY     Social History   Occupational History   Not on file  Tobacco Use   Smoking status: Former    Current packs/day: 0.00    Average packs/day: 1 pack/day for 17.0  years (17.0 ttl pk-yrs)    Types: Cigarettes    Start date: 12/23/1976    Quit date: 12/23/1993    Years since quitting: 29.9   Smokeless tobacco: Never  Vaping Use   Vaping status: Never Used  Substance and Sexual Activity   Alcohol use: No    Alcohol/week: 0.0 standard drinks of alcohol   Drug use: No   Sexual activity: Yes

## 2023-11-18 ENCOUNTER — Other Ambulatory Visit: Payer: Self-pay

## 2023-11-18 MED ORDER — NITROGLYCERIN 0.4 MG SL SUBL: SUBLINGUAL_TABLET | SUBLINGUAL | 0 refills | Status: DC

## 2023-12-02 ENCOUNTER — Inpatient Hospital Stay: Payer: Medicare HMO | Attending: Oncology

## 2023-12-02 ENCOUNTER — Inpatient Hospital Stay: Payer: Medicare HMO

## 2023-12-02 ENCOUNTER — Telehealth: Payer: Self-pay

## 2023-12-02 DIAGNOSIS — D509 Iron deficiency anemia, unspecified: Secondary | ICD-10-CM | POA: Insufficient documentation

## 2023-12-02 DIAGNOSIS — D500 Iron deficiency anemia secondary to blood loss (chronic): Secondary | ICD-10-CM

## 2023-12-02 LAB — CBC WITH DIFFERENTIAL (CANCER CENTER ONLY)
Abs Immature Granulocytes: 0.01 10*3/uL (ref 0.00–0.07)
Basophils Absolute: 0 10*3/uL (ref 0.0–0.1)
Basophils Relative: 1 %
Eosinophils Absolute: 0.1 10*3/uL (ref 0.0–0.5)
Eosinophils Relative: 1 %
HCT: 40 % (ref 39.0–52.0)
Hemoglobin: 13 g/dL (ref 13.0–17.0)
Immature Granulocytes: 0 %
Lymphocytes Relative: 18 %
Lymphs Abs: 0.9 10*3/uL (ref 0.7–4.0)
MCH: 27.5 pg (ref 26.0–34.0)
MCHC: 32.5 g/dL (ref 30.0–36.0)
MCV: 84.6 fL (ref 80.0–100.0)
Monocytes Absolute: 0.6 10*3/uL (ref 0.1–1.0)
Monocytes Relative: 12 %
Neutro Abs: 3.3 10*3/uL (ref 1.7–7.7)
Neutrophils Relative %: 68 %
Platelet Count: 295 10*3/uL (ref 150–400)
RBC: 4.73 MIL/uL (ref 4.22–5.81)
RDW: 15.3 % (ref 11.5–15.5)
WBC Count: 4.9 10*3/uL (ref 4.0–10.5)
nRBC: 0 % (ref 0.0–0.2)

## 2023-12-02 LAB — FERRITIN: Ferritin: 24 ng/mL (ref 24–336)

## 2023-12-02 NOTE — Telephone Encounter (Signed)
Patient gave verbal understanding and had no further questions or concerns  

## 2023-12-02 NOTE — Telephone Encounter (Signed)
-----   Message from Lonna Cobb sent at 12/02/2023  3:09 PM EST ----- Please let him know hemoglobin and ferritin are stable in normal range.  Follow-up as scheduled.

## 2023-12-16 ENCOUNTER — Ambulatory Visit (INDEPENDENT_AMBULATORY_CARE_PROVIDER_SITE_OTHER): Payer: Medicare HMO

## 2023-12-16 VITALS — BP 102/56 | HR 98 | Temp 97.9°F | Ht 62.0 in | Wt 131.0 lb

## 2023-12-16 DIAGNOSIS — Z0000 Encounter for general adult medical examination without abnormal findings: Secondary | ICD-10-CM | POA: Diagnosis not present

## 2023-12-16 NOTE — Patient Instructions (Signed)
Tony Mcmillan , Thank you for taking time to come for your Medicare Wellness Visit. I appreciate your ongoing commitment to your health goals. Please review the following plan we discussed and let me know if I can assist you in the future.   Referrals/Orders/Follow-Ups/Clinician Recommendations: none  This is a list of the screening recommended for you and due dates:  Health Maintenance  Topic Date Due   COVID-19 Vaccine (8 - 2024-25 season) 10/17/2023   Medicare Annual Wellness Visit  12/15/2024   DTaP/Tdap/Td vaccine (2 - Td or Tdap) 12/08/2025   Pneumonia Vaccine  Completed   Flu Shot  Completed   Zoster (Shingles) Vaccine  Completed   HPV Vaccine  Aged Out   Colon Cancer Screening  Discontinued   Hepatitis C Screening  Discontinued    Advanced directives: (In Chart) A copy of your advanced directives are scanned into your chart should your provider ever need it.  Next Medicare Annual Wellness Visit scheduled for next year: Yes  insert Preventive Care attachment Insert FALL PREVENTION attachment if needed

## 2023-12-16 NOTE — Progress Notes (Signed)
Subjective:   Tony Mcmillan is a 82 y.o. male who presents for Medicare Annual/Subsequent preventive examination.  Visit Complete: In person    Cardiac Risk Factors include: advanced age (>61men, >44 women);dyslipidemia;male gender     Objective:    Today's Vitals   12/16/23 1144 12/16/23 1145  BP: (!) 102/56   Pulse: 98   Temp: 97.9 F (36.6 C)   TempSrc: Oral   SpO2: 93%   Weight: 131 lb (59.4 kg)   Height: 5\' 2"  (1.575 m)   PainSc:  7    Body mass index is 23.96 kg/m.     12/16/2023   11:55 AM 09/02/2023    1:15 PM 07/02/2023    1:41 PM 06/27/2023    1:02 PM 06/02/2023    1:42 PM 03/24/2023    6:26 PM 01/28/2023   11:49 AM  Advanced Directives  Does Patient Have a Medical Advance Directive? Yes Yes;No Yes Yes No No Yes  Type of Advance Directive Out of facility DNR (pink MOST or yellow form)  Living will;Healthcare Power of [employer] of [place];Living will Living will;Healthcare Power of [employer] Power of [place];Living will  Does patient want to make changes to medical advance directive?  Yes (MAU/Ambulatory/Procedural Areas - Information given) No - Patient declined No - Patient declined No - Patient declined  No - Patient declined  Copy of Healthcare Power of Attorney in Chart?       Yes - validated most recent copy scanned in chart (See row information)  Would patient like information on creating a medical advance directive?   No - Patient declined  No - Patient declined      Current Medications (verified) Outpatient Encounter Medications as of 12/16/2023  Medication Sig   albuterol (VENTOLIN HFA) 108 (90 Base) MCG/ACT inhaler Inhale 2 puffs into the lungs every 6 (six) hours as needed for wheezing or shortness of breath.   ASPIRIN LOW DOSE 81 MG tablet TAKE 1 TABLET BY MOUTH DAILY *SWALLOW WHOLE*   Calcium Carb-Cholecalciferol (CALCIUM-VITAMIN D3) 600-400 MG-UNIT TABS Take 1 tablet by mouth 2 (two) times daily.    Glycopyrrolate-Formoterol (BEVESPI AEROSPHERE) 9-4.8 MCG/ACT AERO Inhale 2 puffs into the lungs in the morning and at bedtime.   isosorbide mononitrate (IMDUR) 30 MG 24 hr tablet TAKE 0.5 TABLETS BY MOUTH DAILY. PLEASE CALL OFFICE TO SCHEDULE AN APPT FOR FURTHER REFILLS.   leflunomide (ARAVA) 20 MG tablet Take 20 mg by mouth daily.   metoprolol succinate (TOPROL-XL) 25 MG 24 hr tablet Take 1 tablet (25 mg total) by mouth daily.   MYRBETRIQ 50 MG TB24 tablet Take 50 mg by mouth daily.    nitroGLYCERIN (NITROSTAT) 0.4 MG SL tablet PLACE 1 TABLET UNDER THE TONGUE EVERY 5 MINUTES AS NEEDED FOR CHEST PAIN.   polyethylene glycol (MIRALAX) 17 g packet Take 17 g by mouth daily as needed.   REMICADE 100 MG injection    traMADol (ULTRAM) 50 MG tablet Take 1 tablet (50 mg total) by mouth every 12 (twelve) hours as needed.   atorvastatin (LIPITOR) 40 MG tablet TAKE 1 TABLET BY MOUTH EVERY DAY (Patient not taking: Reported on 12/16/2023)   docusate sodium (COLACE) 250 MG capsule Take 250 mg by mouth daily. (Patient not taking: Reported on 12/16/2023)   ferrous sulfate 325 (65 FE) MG EC tablet Take 1 tablet (325 mg total) by mouth 2 (two) times daily. (Patient not taking: Reported on 12/16/2023)   gabapentin (NEURONTIN) 300 MG capsule Take 300  mg by mouth 2 (two) times daily. (Patient not taking: Reported on 12/16/2023)   naproxen (NAPROSYN) 375 MG tablet Take 1 tablet (375 mg total) by mouth 2 (two) times daily. (Patient not taking: Reported on 12/16/2023)   pantoprazole (PROTONIX) 40 MG tablet Take 1 tablet (40 mg total) by mouth daily. Please schedule a yearly follow up for further refills. Thank you (Patient not taking: Reported on 12/16/2023)   Facility-Administered Encounter Medications as of 12/16/2023  Medication   bupivacaine (MARCAINE) 0.5 % (with pres) injection 4 mL   lidocaine (XYLOCAINE) 2 % (with pres) injection 20 mg    Allergies (verified) Patient has no known allergies.   History: Past  Medical History:  Diagnosis Date   3-vessel coronary artery disease 03/10/2020   Allergy    Anemia    Aortic valve calcification 03/10/2020   Arthritis    Asymptomatic microscopic hematuria 04/03/2016   Cystoscopy 02/207 Dr. [facility].     Atypical chest pain    a. 09/2016 MV: EF 63%, normal perfusion.   Benign prostatic hyperplasia 04/03/2016   Asymptomatic. Normal PSA and exam by Dr. [facility] 01/2016.     Bilateral carotid bruits 02/09/2019   Bladder wall thickening 03/10/2020   CAD (coronary artery disease)    Coronary artery calcification on Chest CT in 09/2016 and 01/2019 // Myoview 10/17: normal perfusion // Myoview 01/2019: no ischemia or infarction, EF 58 // Coronary CTA 03/2020: Calcium Score 1067; non-obstructive CAD [oLM 1-24, LAD ost 25049, mid and dist 1-24; D1 1-24; LCx prox and mid 1-24, OM1 and OM2 25-49, RCA and PLB 1-24; PLA 1-24]    Carotid bruit    Carotid Dopplers 01/2019: no ICA stenosis bilaterally    Chronic wrist pain 07/02/2016   Colonic polyp 02/23/2016   COVID-19    DDD (degenerative disc disease), 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 L4-5 and L5-S1    Diastolic dysfunction    a. 2015 Echo: EF >55%;  b. 09/2016 Echo: EF 55-60%, no rwma, Gr1 DD, triv AI/MR, mild TR, PASP .   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   On CT 01/2016    Palpitations    Echo 10/17: Mild focal basal septal hypertrophy, EF 55-60, no RWMA, Gr 1 DD, trivial MR, normal RVSF, mild TR, PASP 28 //  Event monitor 01/2019: NSR, PACs   Prediabetes 10/29/2018   Pulmonary emphysema (HCC) 12/18/2016   Renal cyst, left 04/03/2016   On CT, benign    Rheumatoid arthritis (HCC)    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 this was in [place]   CARPAL TUNNEL RELEASE Left 09/29/2023   CATARACT EXTRACTION     both eyes   COLONOSCOPY     COLOSTOMY REVERSAL  had infection on buttocks and had to have a skin graft- had colostomy to help area stay clean and heal   HERNIA REPAIR     POLYPECTOMY     SPINE SURGERY     Family History  Problem Relation Age of Onset   Hypertension Mother    Hypertension Father    Diabetes Father    Heart disease Father        stents   Hyperlipidemia Sister    Diabetes Sister    Stroke Brother    Diabetes Brother    Heart disease Brother        stents   Colon polyps Neg Hx    Esophageal cancer Neg Hx    Stomach cancer Neg Hx    Rectal cancer Neg Hx    Colon cancer Neg Hx    Social History   Socioeconomic History   Marital status: Widowed    Spouse name: Not on file   Number of children: Not on file   Years of education: Not on file   Highest education level: Not on file  Occupational History   Not on file  Tobacco Use   Smoking status: Former    Current packs/day: 0.00    Average packs/day: 1 pack/day for 17.0 years (17.0 ttl pk-yrs)    Types: Cigarettes    Start date: 12/23/1976    Quit date: 12/23/1993    Years since quitting: 30.0   Smokeless tobacco: Never  Vaping Use   Vaping status: Never Used  Substance and Sexual Activity   Alcohol use: No    Alcohol/week: 0.0 standard drinks of alcohol   Drug use: No   Sexual activity: Yes  Other Topics Concern    Not on file  Social History Narrative   Retired. Lives alone.    Social Drivers of Corporate investment banker Strain: Low Risk  (12/16/2023)   Overall Financial Resource Strain (CARDIA)    Difficulty of Paying Living Expenses: Not hard at all  Food Insecurity: No Food Insecurity (12/16/2023)   Hunger Vital Sign    Worried About Running Out of Food in the Last Year: Never true    Ran Out of Food in the Last Year: Never true  Transportation Needs: No Transportation Needs (12/16/2023)   PRAPARE - Administrator, Civil Service (Medical): No    Lack of Transportation (Non-Medical): No  Physical Activity: Insufficiently Active (12/16/2023)   Exercise Vital Sign    Days of Exercise per Week: 2 days    Minutes of Exercise per Session: 10 min  Stress: No Stress Concern Present (12/16/2023)   [employer] of Occupational Health - Occupational Stress Questionnaire    Feeling of Stress : Not at all  Social Connections: Moderately Isolated (12/16/2023)   Social Connection and Isolation Panel [NHANES]    Frequency of Communication with Friends and Family: More than three times a week    Frequency of Social Gatherings with Friends and Family: Once a week    Attends Religious Services: More than 4 times per year    Active Member of [employer] or Organizations: No    Attends Banker Meetings: Never    Marital Status: Widowed    Tobacco Counseling Counseling given: Not Answered   Clinical Intake:  Pre-visit preparation completed: Yes  Pain : 0-10 Pain Score: 7  Pain Type: Chronic pain Pain Location: Hand Pain Orientation: Left Pain Radiating Towards: up arm Pain Descriptors / Indicators: Aching Pain Onset:  More than a month ago Pain Frequency: Constant     Nutritional Status: BMI of 19-24  Normal Nutritional Risks: None Diabetes: No  How often do you need to have someone help you when you read instructions, pamphlets, or other written materials from  your doctor or pharmacy?: 1 - Never  Interpreter Needed?: No  Information entered by :: NAllen LPN   Activities of Daily Living    12/16/2023   11:47 AM  In your present state of health, do you have any difficulty performing the following activities:  Hearing? 0  Vision? 1  Comment blurry sometimes  Difficulty concentrating or making decisions? 0  Walking or climbing stairs? 0  Dressing or bathing? 0  Doing errands, shopping? 0  Preparing Food and eating ? N  Using the Toilet? N  In the past six months, have you accidently leaked urine? N  Do you have problems with loss of bowel control? N  Managing your Medications? N  Managing your Finances? N  Housekeeping or managing your Housekeeping? N    Patient Care Team: Early, Sung Amabile, NP as PCP - General (Nurse Practitioner) Meriam Sprague, MD (Inactive) as PCP - Cardiology (Cardiology) Kennon Rounds as Physician Assistant (Cardiology) Kalman Shan, MD as Consulting Physician (Pulmonary Disease) Heloise Purpura, MD as Consulting Physician (Urology) Donnetta Hail, MD as Consulting Physician (Rheumatology)  Indicate any recent Medical Services you may have received from other than [facility] providers in the past year (date may be approximate).     Assessment:   This is a routine wellness examination for [facility].  Hearing/Vision screen Hearing Screening - Comments:: Denies hearing issues Vision Screening - Comments:: Regular eye exams, [facility]   Goals Addressed             This Visit's Progress    Patient Stated       12/16/2023, stay healthy       Depression Screen    12/16/2023   11:57 AM 12/13/2022   11:51 AM 12/07/2021   12:02 PM 04/09/2021    2:03 PM 09/06/2020   11:01 AM 04/07/2020   10:44 AM 08/03/2018    1:46 PM  PHQ 2/9 Scores  PHQ - 2 Score 0 0 0 0 0 0 0  PHQ- 9 Score 0 0         Fall Risk    12/16/2023   11:56 AM 12/13/2022   11:51 AM 05/09/2022   11:13 AM 12/07/2021    12:02 PM 04/09/2021    2:03 PM  Fall Risk   Falls in the past year? 0 0 0 0 0  Number falls in past yr: 0 0 0  0  Injury with Fall? 0 0 0  0  Risk for fall due to : Medication side effect;Impaired mobility;Impaired balance/gait No Fall Risks;Impaired balance/gait;Impaired mobility;Medication side effect Impaired mobility;Impaired balance/gait Medication side effect No Fall Risks  Follow up Falls prevention discussed;Falls evaluation completed Falls prevention discussed;Education provided;Falls evaluation completed Education provided;Falls evaluation completed Falls evaluation completed;Education provided;Falls prevention discussed Falls evaluation completed    MEDICARE RISK AT HOME: Medicare Risk at Home Any stairs in or around the home?: No If so, are there any without handrails?: No Home free of loose throw rugs in walkways, pet beds, electrical cords, etc?: Yes Adequate lighting in your home to reduce risk of falls?: Yes Life alert?: No Use of a cane, walker or w/c?: Yes Grab bars in the bathroom?: No Shower chair or bench in  shower?: Yes Elevated toilet seat or a handicapped toilet?: No  TIMED UP AND GO:  Was the test performed?  Yes  Length of time to ambulate 10 feet: 7 sec Gait slow and steady with assistive device    Cognitive Function:        12/16/2023   11:57 AM 12/13/2022   11:53 AM 12/07/2021   12:03 PM  6CIT Screen  What Year? 0 points 0 points 0 points  What month? 0 points 0 points 0 points  What time? 0 points 0 points 0 points  Count back from 20 0 points 0 points 0 points  Months in reverse 4 points 4 points 4 points  Repeat phrase 2 points 4 points 6 points  Total Score 6 points 8 points 10 points    Immunizations Immunization History  Administered Date(s) Administered   Influenza, High Dose Seasonal PF 07/23/2017, 08/05/2019, 08/30/2020, 08/22/2021, 08/10/2022, 08/22/2023   Influenza,inj,Quad PF,6+ Mos 08/01/2017   Influenza-Unspecified  10/30/2015, 09/13/2016, 07/30/2018, 08/05/2019   Moderna Covid-19 Fall Seasonal Vaccine 67yrs & older 08/22/2023   PFIZER Comirnaty(Gray Top)Covid-19 Tri-Sucrose Vaccine 05/12/2021, 09/23/2022   PFIZER(Purple Top)SARS-COV-2 Vaccination 01/28/2020, 02/18/2020, 09/19/2020   PNEUMOCOCCAL CONJUGATE-20 08/17/2021   Pfizer Covid-19 Vaccine Bivalent Booster 48yrs & up 11/05/2021   Pneumococcal Conjugate-13 12/05/2015   Pneumococcal Polysaccharide-23 04/23/2017   Rsv, Bivalent, Protein Subunit Rsvpref,pf Verdis Frederickson) 03/14/2023   Tdap 12/09/2015   Zoster Recombinant(Shingrix) 10/26/2021, 08/20/2022   Zoster, Live 12/09/2015    TDAP status: Up to date  Flu Vaccine status: Up to date  Pneumococcal vaccine status: Up to date  Covid-19 vaccine status: Completed vaccines  Qualifies for Shingles Vaccine? Yes   Zostavax completed Yes   Shingrix Completed?: Yes  Screening Tests Health Maintenance  Topic Date Due   COVID-19 Vaccine (8 - 2024-25 season) 10/17/2023   Medicare Annual Wellness (AWV)  12/15/2024   DTaP/Tdap/Td (2 - Td or Tdap) 12/08/2025   Pneumonia Vaccine 16+ Years old  Completed   INFLUENZA VACCINE  Completed   Zoster Vaccines- Shingrix  Completed   HPV VACCINES  Aged Out   Colonoscopy  Discontinued   Hepatitis C Screening  Discontinued    Health Maintenance  Health Maintenance Due  Topic Date Due   COVID-19 Vaccine (8 - 2024-25 season) 10/17/2023    Colorectal cancer screening: No longer required.   Lung Cancer Screening: (Low Dose CT Chest recommended if Age 79-80 years, 20 pack-year currently smoking OR have quit w/in 15years.) does not qualify.   Lung Cancer Screening Referral: no  Additional Screening:  Hepatitis C Screening: does not qualify;   Vision Screening: Recommended annual ophthalmology exams for early detection of glaucoma and other disorders of the eye. Is the patient up to date with their annual eye exam?  Yes  Who is the provider or what is the  name of the office in which the patient attends annual eye exams? [facility] If pt is not established with a provider, would they like to be referred to a provider to establish care? No .   Dental Screening: Recommended annual dental exams for proper oral hygiene  Diabetic Foot Exam: n/a  Community Resource Referral / Chronic Care Management: CRR required this visit?  No   CCM required this visit?  No     Plan:     I have personally reviewed and noted the following in the patient's chart:   Medical and social history Use of alcohol, tobacco or illicit drugs  Current medications and supplements  including opioid prescriptions. Patient is not currently taking opioid prescriptions. Functional ability and status Nutritional status Physical activity Advanced directives List of other physicians Hospitalizations, surgeries, and ER visits in previous 12 months Vitals Screenings to include cognitive, depression, and falls Referrals and appointments  In addition, I have reviewed and discussed with patient certain preventive protocols, quality metrics, and best practice recommendations. A written personalized care plan for preventive services as well as general preventive health recommendations were provided to patient.     Barb Merino, LPN   16/09/9603   After Visit Summary: (In Person-Printed) AVS printed and given to the patient  Nurse Notes: none

## 2023-12-29 ENCOUNTER — Telehealth: Payer: Self-pay | Admitting: Cardiology

## 2023-12-29 ENCOUNTER — Other Ambulatory Visit: Payer: Self-pay

## 2023-12-29 ENCOUNTER — Emergency Department (HOSPITAL_BASED_OUTPATIENT_CLINIC_OR_DEPARTMENT_OTHER)
Admission: EM | Admit: 2023-12-29 | Discharge: 2023-12-29 | Disposition: A | Discharge status: Home / Self Care | Payer: Medicare HMO | Attending: Emergency Medicine | Admitting: Emergency Medicine

## 2023-12-29 ENCOUNTER — Encounter (HOSPITAL_BASED_OUTPATIENT_CLINIC_OR_DEPARTMENT_OTHER): Payer: Self-pay | Admitting: Emergency Medicine

## 2023-12-29 ENCOUNTER — Emergency Department (HOSPITAL_BASED_OUTPATIENT_CLINIC_OR_DEPARTMENT_OTHER): Payer: Medicare HMO | Admitting: Radiology

## 2023-12-29 DIAGNOSIS — Z7951 Long term (current) use of inhaled steroids: Secondary | ICD-10-CM | POA: Insufficient documentation

## 2023-12-29 DIAGNOSIS — Z7982 Long term (current) use of aspirin: Secondary | ICD-10-CM | POA: Insufficient documentation

## 2023-12-29 DIAGNOSIS — I2510 Atherosclerotic heart disease of native coronary artery without angina pectoris: Secondary | ICD-10-CM | POA: Insufficient documentation

## 2023-12-29 DIAGNOSIS — J449 Chronic obstructive pulmonary disease, unspecified: Secondary | ICD-10-CM | POA: Diagnosis not present

## 2023-12-29 DIAGNOSIS — R079 Chest pain, unspecified: Secondary | ICD-10-CM

## 2023-12-29 DIAGNOSIS — R072 Precordial pain: Secondary | ICD-10-CM | POA: Insufficient documentation

## 2023-12-29 DIAGNOSIS — I259 Chronic ischemic heart disease, unspecified: Secondary | ICD-10-CM

## 2023-12-29 LAB — BASIC METABOLIC PANEL
Anion gap: 7 (ref 5–15)
BUN: 14 mg/dL (ref 8–23)
CO2: 25 mmol/L (ref 22–32)
Calcium: 8.9 mg/dL (ref 8.9–10.3)
Chloride: 105 mmol/L (ref 98–111)
Creatinine, Ser: 0.97 mg/dL (ref 0.61–1.24)
GFR, Estimated: >60 mL/min (ref >60)
Glucose, Bld: 104 mg/dL — ABNORMAL HIGH (ref 70–99)
Potassium: 4.1 mmol/L (ref 3.5–5.1)
Sodium: 137 mmol/L (ref 135–145)

## 2023-12-29 LAB — CBC
HCT: 36.1 % — ABNORMAL LOW (ref 39.0–52.0)
Hemoglobin: 12 g/dL — ABNORMAL LOW (ref 13.0–17.0)
MCH: 27.6 pg (ref 26.0–34.0)
MCHC: 33.2 g/dL (ref 30.0–36.0)
MCV: 83 fL (ref 80.0–100.0)
Platelets: 262 10*3/uL (ref 150–400)
RBC: 4.35 MIL/uL (ref 4.22–5.81)
RDW: 15.3 % (ref 11.5–15.5)
WBC: 4.1 10*3/uL (ref 4.0–10.5)
nRBC: 0 % (ref 0.0–0.2)

## 2023-12-29 LAB — TROPONIN I (HIGH SENSITIVITY)
Troponin I (High Sensitivity): 5 ng/L (ref <18)
Troponin I (High Sensitivity): 5 ng/L (ref <18)

## 2023-12-29 NOTE — Telephone Encounter (Signed)
 Patient currently at [facility] with chest pain.  Discussed with Dr. Michele and he recommends outpatient coronary CT and echocardiogram.  He was going to follow-up with Glendia Ferrier on February 17, 2024 for his annual follow-up however I have moved this up to the fifth so he seen in more timely manner.  Please call patient and let him know of this plan and organize images as below.

## 2023-12-29 NOTE — ED Triage Notes (Signed)
 Left side chest pain started earlier this week  Today sharp pain radiated into left shoulder/arm Comes and goes

## 2023-12-29 NOTE — Progress Notes (Signed)
 ON-CALL CARDIOLOGY 01/04/24  Late entry - Telephone encounter was on 12/29/2023 w/ ER staff.   Patient's name: Tony Mcmillan.   MRN: [id number].    DOB: [birth year] Primary care provider: Oris Camie BRAVO, NP.  Location Patient : ER Provider: ER Consultant (Me): Hospital   Interaction regarding this patient's care today: Patient presents to the hospital with a chief complaint of chest pain.  Based on my discussion with the ER provider patient has been experiencing for the last few days chest pain daily, left-sided, brought on by moving around, nonradiating, sublingual nitroglycerin  tablets to help.  Last episode earlier today at 12:30 PM.  High sensitive troponins negative x 2. EKG nonischemic. Prior coronary CTA from April 2021 reviewed   Impression:   ICD-10-CM   1. Precordial chest pain  R07.2      Recommendations: Given the patient's age, his heart score, and risk factors it is very reasonable to review reevaluate his chest pain.  Overall probability for ACS is low as the troponins are within normal limits.  However, recommend either inpatient evaluation for ischemic workup/cardiology consult or close outpatient follow-up and will order echo and coronary CTA.  ER provider spoke to the patient regards to his presentation, objective findings, and recommendations.  Patient prefers outpatient follow-up.  Echo will be ordered to evaluate for structural heart disease and left ventricular systolic function.  Coronary CTA to evaluate for CAC, plaque appointment, obstructive disease  Follow up arranged on 01/28/2024.  Educated him on seeking medical attention sooner by going to the closest ER via EMS if the symptoms increase in intensity, frequency, duration, or has typical chest pain as discussed in the office.    Telephone encounter total time: 17 minutes. CPT 00552   Madonna Large, DO, [facility]  [facility]  [address] [place], [place] [address] 01/04/2024 6:09  PM

## 2023-12-29 NOTE — Discharge Instructions (Signed)
 As we discussed, your workup in the ER today was reassuring for acute findings.  Laboratory evaluation, x-ray imaging, and EKG did not reveal any emergent cause of your pain.  Given your cardiac risk factors, we did discuss your case with our cardiologist on-call who reviewed your case and admission was offered to you. However, you had chosen to have your workup managed outpatient which is reasonable again given that your workup was negative and you are currently pain-free.  However, if you develop any new or worsening pain or pain that is long-lasting, worse with exertion, relieved with rest, or persistent pain that does not go away then you will need to return emergently for evaluation and likely admission.   Return if development of any new or worsening symptoms.

## 2023-12-29 NOTE — ED Provider Notes (Signed)
 [place] EMERGENCY DEPARTMENT AT [facility] Provider Note   CSN: [id number] Arrival date & time: 12/29/23  1324     History  Chief Complaint  Patient presents with   Chest Pain    Tony Mcmillan is a 83 y.o. male.  Patient with history of CAD, COPD, GERD, hyperlipidemia presents today with complaints of chest pain. He states that same has been occurring intermittently over the past few days. States the pain occurs when he moves around and is sharp in his left chest and does not radiate. He states the pain lasts about 3-4 minutes, he takes a nitroglycerin , stops what he is doing, and the pain resolves. He states that he has had about 3-4 episodes per day with the most recent being at 12:30 PM this afternoon. Again resolved with nitroglycerin , currently chest pain free. Denies fevers, chills, cough, congestion, shortness of breath, diaphoresis, nausea, or vomiting.  The history is provided by the patient. No language interpreter was used.  Chest Pain      Home Medications Prior to Admission medications   Medication Sig Start Date End Date Taking? Authorizing Provider  albuterol  (VENTOLIN  HFA) 108 (90 Base) MCG/ACT inhaler Inhale 2 puffs into the lungs every 6 (six) hours as needed for wheezing or shortness of breath. 04/17/23   Parrett, Madelin RAMAN, NP  ASPIRIN  LOW DOSE 81 MG tablet TAKE 1 TABLET BY MOUTH DAILY *SWALLOW WHOLE* 10/27/23   Lelon, Scott T, PA-C  atorvastatin  (LIPITOR) 40 MG tablet TAKE 1 TABLET BY MOUTH EVERY DAY Patient not taking: Reported on 12/16/2023 02/27/22   Delford Maude BROCKS, MD  Calcium  Carb-Cholecalciferol (CALCIUM -VITAMIN D3) 600-400 MG-UNIT TABS Take 1 tablet by mouth 2 (two) times daily.    [provider]  docusate sodium (COLACE) 250 MG capsule Take 250 mg by mouth daily. Patient not taking: Reported on 12/16/2023    [provider]  ferrous sulfate  325 (65 FE) MG EC tablet Take 1 tablet (325 mg total) by mouth 2 (two) times  daily. Patient not taking: Reported on 12/16/2023 05/27/22   Debby Olam POUR, NP  gabapentin  (NEURONTIN ) 300 MG capsule Take 300 mg by mouth 2 (two) times daily. Patient not taking: Reported on 12/16/2023 08/06/23   [provider]  Glycopyrrolate -Formoterol  (BEVESPI  AEROSPHERE) 9-4.8 MCG/ACT AERO Inhale 2 puffs into the lungs in the morning and at bedtime. 04/17/23   Parrett, Madelin RAMAN, NP  isosorbide  mononitrate (IMDUR ) 30 MG 24 hr tablet TAKE 0.5 TABLETS BY MOUTH DAILY. PLEASE CALL OFFICE TO SCHEDULE AN APPT FOR FURTHER REFILLS. 10/27/23   Lelon Hamilton T, PA-C  leflunomide (ARAVA) 20 MG tablet Take 20 mg by mouth daily.    [provider]  metoprolol  succinate (TOPROL -XL) 25 MG 24 hr tablet Take 1 tablet (25 mg total) by mouth daily. 12/30/22   Hobart Powell BRAVO, MD  MYRBETRIQ 50 MG TB24 tablet Take 50 mg by mouth daily.  02/07/19   [provider]  naproxen  (NAPROSYN ) 375 MG tablet Take 1 tablet (375 mg total) by mouth 2 (two) times daily. Patient not taking: Reported on 12/16/2023 03/22/23   Redwine, Madison A, PA-C  nitroGLYCERIN  (NITROSTAT ) 0.4 MG SL tablet PLACE 1 TABLET UNDER THE TONGUE EVERY 5 MINUTES AS NEEDED FOR CHEST PAIN. 11/18/23   Verlin Lonni BIRCH, MD  pantoprazole  (PROTONIX ) 40 MG tablet Take 1 tablet (40 mg total) by mouth daily. Please schedule a yearly follow up for further refills. Thank you Patient not taking: Reported on 12/16/2023 03/17/23  Aneita Gwendlyn DASEN, MD  polyethylene glycol (MIRALAX ) 17 g packet Take 17 g by mouth daily as needed. 05/27/22   Debby Olam POUR, NP  REMICADE 100 MG injection  03/05/23   [provider]  traMADol  (ULTRAM ) 50 MG tablet Take 1 tablet (50 mg total) by mouth every 12 (twelve) hours as needed. 10/14/23   Barbarann Oneil BROCKS, MD      Allergies    Patient has no known allergies.    Review of Systems   Review of Systems  Cardiovascular:  Positive for chest pain.  All other systems reviewed and are  negative.   Physical Exam Updated Vital Signs BP 108/62   Pulse 65   Temp 98.2 F (36.8 C)   Resp (!) 24   SpO2 97%  Physical Exam Vitals and nursing note reviewed.  Constitutional:      General: He is not in acute distress.    Appearance: Normal appearance. He is normal weight. He is not ill-appearing, toxic-appearing or diaphoretic.  HENT:     Head: Normocephalic and atraumatic.  Cardiovascular:     Rate and Rhythm: Normal rate and regular rhythm.     Pulses:          Dorsalis pedis pulses are 2+ on the right side and 2+ on the left side.       Posterior tibial pulses are 2+ on the right side and 2+ on the left side.     Heart sounds: Normal heart sounds.  Pulmonary:     Effort: Pulmonary effort is normal. No respiratory distress.  Chest:     Chest wall: No tenderness.  Abdominal:     Palpations: Abdomen is soft.     Tenderness: There is no abdominal tenderness.  Musculoskeletal:        General: Normal range of motion.     Cervical back: Normal range of motion.     Right lower leg: No tenderness. No edema.     Left lower leg: No tenderness. No edema.  Skin:    General: Skin is warm and dry.  Neurological:     General: No focal deficit present.     Mental Status: He is alert.  Psychiatric:        Mood and Affect: Mood normal.        Behavior: Behavior normal.     ED Results / Procedures / Treatments   Labs (all labs ordered are listed, but only abnormal results are displayed) Labs Reviewed  BASIC METABOLIC PANEL - Abnormal; Notable for the following components:      Result Value   Glucose, Bld 104 (*)    All other components within normal limits  CBC - Abnormal; Notable for the following components:   Hemoglobin 12.0 (*)    HCT 36.1 (*)    All other components within normal limits  TROPONIN I (HIGH SENSITIVITY)  TROPONIN I (HIGH SENSITIVITY)    EKG None  Radiology DG Chest 2 View Result Date: 12/29/2023 CLINICAL DATA:  Chest pain EXAM: CHEST - 2  VIEW COMPARISON:  04/17/2023 FINDINGS: Marked right hemidiaphragm elevation may be minimally increased. Midline trachea. Normal heart size. Atherosclerosis in the transverse aorta. No pleural effusion or pneumothorax. No acute cardiopulmonary disease. IMPRESSION: No active cardiopulmonary disease. Electronically Signed   By: Rockey Kilts M.D.   On: 12/29/2023 14:41    Procedures Procedures    Medications Ordered in ED Medications - No data to display  ED Course/ Medical Decision Making/ A&P  HEART Score: 5                    Medical Decision Making Amount and/or Complexity of Data Reviewed Labs: ordered. Radiology: ordered.   This patient is a 83 y.o. male who presents to the ED for concern of chest pain, this involves an extensive number of treatment options, and is a complaint that carries with it a high risk of complications and morbidity. The emergent differential diagnosis prior to evaluation includes, but is not limited to,  ACS, pericarditis, myocarditis, aortic dissection, PE, pneumothorax, esophageal spasm or rupture, chronic angina, pneumonia, bronchitis, GERD, reflux/PUD, biliary disease, pancreatitis, costochondritis, anxiety   This is not an exhaustive differential.   Past Medical History / Co-morbidities / Social History:  has a past medical history of 3-vessel coronary artery disease (03/10/2020), Allergy , Anemia, Aortic valve calcification (03/10/2020), Arthritis, Asymptomatic microscopic hematuria (04/03/2016), Atypical chest pain, Benign prostatic hyperplasia (04/03/2016), Bilateral carotid bruits (02/09/2019), Bladder wall thickening (03/10/2020), CAD (coronary artery disease), Carotid bruit, Chronic wrist pain (07/02/2016), Colonic polyp (02/23/2016), COVID-19, DDD (degenerative disc disease), lumbar (04/03/2016), Diastolic dysfunction, DJD (degenerative joint disease), cervical (11/04/2016), Duodenal ulcer (02/23/2016), Emphysema of lung (HCC) (02/08/2016),  Enlarged prostate (03/10/2020), Fall (03/26/2023), GERD (gastroesophageal reflux disease), History of colonic polyps (07/02/2016), History of gastric ulcer (07/02/2016), History of systemic steroid therapy (10/30/2017), Hx of Blood Transfusion, Hyperlipidemia, Intermittent palpitations (01/29/2019), Left upper arm pain (01/12/2023), 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  Additional history: Chart reviewed. Pertinent results include: follows with [facility] for cardiology, most recent testing (echo, CT coronary) in 2022  Physical Exam: Physical exam performed. The pertinent findings include: per above, no physical exam abnormalities  Lab Tests: I ordered, and personally interpreted labs.  The pertinent results include:  hgb 12. Delta troponin wnl (5 --> 5)   Imaging Studies: I ordered imaging studies including CXR. I independently visualized and interpreted imaging which showed NAD. I agree with the radiologist interpretation.   Cardiac Monitoring:  The patient was maintained on a cardiac monitor.  My attending physician Dr. Jerrol viewed and interpreted the cardiac monitored which showed an underlying rhythm of: sinus rhythm, no STEMI. I agree with this interpretation.   Medications: Considered medication, however patient chest pain free  Consultations Obtained: I requested consultation with the cardiology on call Dr. Michele,  and discussed lab and imaging findings as well as pertinent plan - they recommend: patient with elevated heart score, however is chest pain free with story that is not typical cardiac, however is relieved with nitroglycerin . Is work-up is however benign. He also hasn't had cardiac  evaluation in a few years. Given this, Dr. Michele states patients discretion on dispo. Can be admitted for CT coronary and formal cardiology evaluation vs d/c home with close outpatient follow-up, return precautions.    Disposition: After consideration of the diagnostic results and the patients response to treatment, I feel that emergency department workup does not suggest an emergent condition requiring admission or immediate intervention beyond what has been performed at this time. The plan is: Discharge with close outpatient follow-up and return precautions.  Did offer admission to the patient per cardiology recommendations, however patient would prefer to manage his symptoms at home.  Given that he is chest pain-free with negative workup, feel this is reasonable. Strict return precautions given.  Evaluation and diagnostic testing in the emergency department does not suggest an emergent condition requiring admission or immediate intervention beyond what has been performed at this time.  Plan for discharge with close PCP follow-up.  Patient is understanding and amenable with plan, educated on red flag symptoms that would prompt immediate return.  Patient discharged in stable condition.   I discussed this case with my attending physician Dr. Jerrol who cosigned this note including patient's presenting symptoms, physical exam, and planned diagnostics and interventions. Attending physician stated agreement with plan or made changes to plan which were implemented.    Final Clinical Impression(s) / ED Diagnoses Final diagnoses:  Precordial chest pain    Rx / DC Orders ED Discharge Orders     None     An After Visit Summary was printed and given to the patient.     Nora Lauraine DELENA DEVONNA 12/29/23 1715    Jerrol Agent, MD 12/29/23 [phone]

## 2023-12-30 NOTE — Telephone Encounter (Signed)
 He is already an established  patient of the practice.  His appointment was with Tereso Newcomer on 02/17/2024 which has been pretty prolonged to 01/28/2024 with Tereso Newcomer.  Mouna Yager Farmers, DO, [facility]

## 2024-01-04 DIAGNOSIS — R072 Precordial pain: Secondary | ICD-10-CM

## 2024-01-09 ENCOUNTER — Telehealth (HOSPITAL_COMMUNITY): Payer: Self-pay | Admitting: *Deleted

## 2024-01-09 NOTE — Telephone Encounter (Signed)
Reaching out to patient to offer assistance regarding upcoming cardiac imaging study; pt verbalizes understanding of appt date/time, parking situation and where to check in, pre-test NPO status and verified current allergies; name and call back number provided for further questions should they arise  Larey Brick RN Navigator Cardiac Imaging [facility] [phone] office [phone] cell  Patient to take his daily metoprolol and is aware to arrive at 1 PM.

## 2024-01-12 ENCOUNTER — Ambulatory Visit (HOSPITAL_COMMUNITY)
Admission: RE | Admit: 2024-01-12 | Discharge: 2024-01-12 | Disposition: A | Discharge status: Home / Self Care | Payer: Medicare HMO | Source: Ambulatory Visit | Attending: Cardiology | Admitting: Cardiology

## 2024-01-12 DIAGNOSIS — I259 Chronic ischemic heart disease, unspecified: Secondary | ICD-10-CM | POA: Diagnosis not present

## 2024-01-12 MED ORDER — NITROGLYCERIN 0.4 MG SL SUBL
0.8000 mg | SUBLINGUAL_TABLET | SUBLINGUAL | Status: DC | PRN
  Administered 2024-01-12: 0.8 mg via SUBLINGUAL

## 2024-01-12 MED ORDER — IOHEXOL 350 MG/ML SOLN
95.0000 mL | Freq: Once | INTRAVENOUS | Status: AC | PRN
  Administered 2024-01-12: 95 mL via INTRAVENOUS

## 2024-01-12 MED ORDER — NITROGLYCERIN 0.4 MG SL SUBL
SUBLINGUAL_TABLET | SUBLINGUAL | Status: AC
  Filled 2024-01-12: qty 2

## 2024-01-12 MED ORDER — SODIUM CHLORIDE 0.9 % IV BOLUS
250.0000 mL | Freq: Once | INTRAVENOUS | Status: AC
  Administered 2024-01-12: 250 mL via INTRAVENOUS

## 2024-01-12 NOTE — Progress Notes (Signed)
Pt verbalized understanding of discharge instruction; opportunity for questions provided ?

## 2024-01-20 ENCOUNTER — Ambulatory Visit (HOSPITAL_COMMUNITY): Payer: Medicare HMO | Attending: Cardiology

## 2024-01-20 DIAGNOSIS — I259 Chronic ischemic heart disease, unspecified: Secondary | ICD-10-CM | POA: Insufficient documentation

## 2024-01-20 LAB — ECHOCARDIOGRAM COMPLETE
Area-P 1/2: 3.42 cm2
P 1/2 time: 378 ms
S' Lateral: 2.3 cm

## 2024-01-28 ENCOUNTER — Encounter: Payer: Self-pay | Admitting: Physician Assistant

## 2024-01-28 ENCOUNTER — Other Ambulatory Visit: Payer: Self-pay | Admitting: *Deleted

## 2024-01-28 ENCOUNTER — Ambulatory Visit: Payer: Medicare HMO | Attending: Physician Assistant | Admitting: Physician Assistant

## 2024-01-28 VITALS — BP 108/58 | HR 81 | Resp 16 | Ht 62.0 in | Wt 189.6 lb

## 2024-01-28 DIAGNOSIS — I25119 Atherosclerotic heart disease of native coronary artery with unspecified angina pectoris: Secondary | ICD-10-CM

## 2024-01-28 DIAGNOSIS — E7800 Pure hypercholesterolemia, unspecified: Secondary | ICD-10-CM

## 2024-01-28 DIAGNOSIS — E782 Mixed hyperlipidemia: Secondary | ICD-10-CM

## 2024-01-28 DIAGNOSIS — J449 Chronic obstructive pulmonary disease, unspecified: Secondary | ICD-10-CM

## 2024-01-28 MED ORDER — PANTOPRAZOLE SODIUM 40 MG PO TBEC: 40.0000 mg | DELAYED_RELEASE_TABLET | Freq: Every day | ORAL | 3 refills | Status: DC

## 2024-01-28 MED ORDER — ATORVASTATIN CALCIUM 40 MG PO TABS: 40.0000 mg | ORAL_TABLET | Freq: Every day | ORAL | 3 refills | Status: DC

## 2024-01-28 NOTE — Assessment & Plan Note (Signed)
 Goal LDL at least < 70, ideally < 55.  -Restart Atorvastatin  40 mg once daily -CMET Lipids in 3 mos

## 2024-01-28 NOTE — Progress Notes (Signed)
 Cardiology Office Note:    Date:  01/28/2024  ID:  [name], DOB Feb 02, 1941, MRN [id number] PCP: Oris Camie BRAVO, NP  [facility] Providers Cardiologist:  Powell BRAVO Sorrow, MD (Inactive) Cardiology APP:  Lelon Glendia DASEN, PA-C       Patient Profile:      Aortic valve calcification Coronary artery disease  Coronary artery calcification on Chest CT in 09/2016 and 01/2019 Myoview  10/17: normal perfusion Echocardiogram 10/14/16:  Mild focal basal septal hypertrophy, EF 55-60, no RWMA, Gr 1 DD, trivial MR, normal RVSF, mild TR, PASP 28 Myoview  01/2019: no ischemia or infarction, EF 58 Carotid US  02/15/2019:  No ICA stenosis  Coronary CTA 03/2020: Calcium  Score 1067; non-obstructive CAD [oLM 1-24, LAD ost 25-49, mid and dist 1-24; D1 1-24; LCx prox and mid 1-24, OM1 and OM2 25-49, RCA and PLB 1-24; PLA 1-24]  Myoview  03/01/21:  EF 72, inf artifact, normal perfusion, low risk Chest pain:  Rx w Toprol  XL, Imdur   CCTA 01/12/24: Nonobstructive CAD - CAC score 1251 (88th percentile), TPV severe (TPV 640 mm-37th percentile, calcific plaque 51 mm, noncalcific plaque 589 mm); pRCA 0-24, mRCA 0-24, dRCA 0-24; LM 0-24; pLAD 0-24, mLAD 0-24; pLCx 0-24, mLCx 0-24; pRI 0-24 TTE 01/20/24: EF 60-65, no RWMA, mild asymmetric LVH, NL RVSF, NL PASP, triival MR, mild AI, AV sclerosis, RAP 3  PACs Monitor 01/2019: NSR, PACs Hyperlipidemia  Emphysema Iron  deficiency anemia  Sees heme; 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 male who returns for post ED visit follow up. He was last seen in 08/2022. He was seen in the ED 01/04/24 for chest pain. Her hsT were neg. Dr. Michele was called by the EDP. IP vs OP workup was offered to the pt and he preferred OP. He has a CCTA done 01/12/24 that revealed nonobstructive CAD. TTE showed normal EF.   Discussed the use of AI scribe software for clinical note transcription with the patient, who gave verbal consent to  proceed.  History of Present Illness   He is here alone. Since the ER visit, he experiences persistent chest tightness and occasional sharp pain, described as substernal tightness and L sided sharp pain. The tightness lasts for a few hours and has been present for an unspecified duration. Nitroglycerin  sometimes provides relief. He uses inhalers for COPD. He has a persistent cough without expectoration. No shortness of breath, wheezing, fever, leg swelling, dizziness, or pain on deep breathing. He cannot sleep flat and prefers to sleep on his side. He was previously on Protonix  but he is currently not taking due to running out of refills. He notes that symptoms may have worsened after stopping Protonix . No association of symptoms with meals or activity.      ROS-See HPI    Studies Reviewed:         Risk Assessment/Calculations:             Physical Exam:   VS:  BP (!) 108/58 (BP Location: Left Arm, Patient Position: Sitting, Cuff Size: Normal)   Pulse 81   Resp 16   Ht 5' 2 (1.575 m)   Wt 189 lb 9.6 oz (86 kg)   SpO2 97%   BMI 34.68 kg/m    Wt Readings from Last 3 Encounters:  01/28/24 189 lb 9.6 oz (86 kg)  12/16/23 131 lb (59.4 kg)  11/11/23 131 lb (59.4 kg)  Constitutional:      Appearance: Healthy appearance. Not in distress.  Neck:     Vascular: JVD normal.  Pulmonary:     Breath sounds: Normal breath sounds. No wheezing. No rales.  Cardiovascular:     Normal rate. Regular rhythm.     Murmurs: There is no murmur.  Edema:    Peripheral edema present.    Pretibial: bilateral trace edema of the pretibial area. Abdominal:     Palpations: Abdomen is soft.      Assessment and Plan:   Assessment & Plan Coronary artery disease involving native coronary artery of native heart with angina pectoris (HCC) Recent trip to the ED with chest pain. His hsT were neg x 2. His CCTA shows nonobstructive CAD. TPV is high. He continues to have chest pain from time to time. He has  improvement with NTG. He was on PPI but has been out for a while. Question if he has microvascular ischemia vs GERD contributing. He also has emphysema and notes chronic cough. I think his lung disease may also be contributing.  -Resume Protonix  40 mg once daily  -Follow up wtih PCP to decide on +/- GI referral -Follow up with Pulmonology -Continue ASA 81 mg once daily, Imdur  15 mg once daily, Metoprolol  succinate 25 mg once daily, PRN NTG -Continue aggressive RF management. -Follow up 6 mos. Mixed hyperlipidemia Goal LDL at least < 70, ideally < 55.  -Restart Atorvastatin  40 mg once daily -CMET Lipids in 3 mos Chronic obstructive pulmonary disease, unspecified COPD type (HCC) Follow up with pulmonology as noted.      Dispo:  Return in about 6 months (around 07/27/2024) for Routine Follow Up, w/ Glendia Ferrier, PA-C.  Signed, Glendia Ferrier, PA-C

## 2024-01-28 NOTE — Patient Instructions (Signed)
Medication Instructions:  Your physician recommends that you continue on your current medications as directed. Please refer to the Current Medication list given to you today.  *If you need a refill on your cardiac medications before your next appointment, please call your pharmacy*   Lab Work: ON MAY 5, GO TO A [facility] NEAR YOU, FASTING, FOR:  CMET & LIPID  If you have labs (blood work) drawn today and your tests are completely normal, you will receive your results only by: MyChart Message (if you have MyChart) OR A paper copy in the mail If you have any lab test that is abnormal or we need to change your treatment, we will call you to review the results.   Testing/Procedures: None ordered   Follow-Up: At [facility], you and your health needs are our priority.  As part of our continuing mission to provide you with exceptional heart care, we have created designated Provider Care Teams.  These Care Teams include your primary Cardiologist (physician) and Advanced Practice Providers (APPs -  Physician Assistants and Nurse Practitioners) who all work together to provide you with the care you need, when you need it.  We recommend signing up for the patient portal called "MyChart".  Sign up information is provided on this After Visit Summary.  MyChart is used to connect with patients for Virtual Visits (Telemedicine).  Patients are able to view lab/test results, encounter notes, upcoming appointments, etc.  Non-urgent messages can be sent to your provider as well.   To learn more about what you can do with MyChart, go to [email or web address].    Your next appointment:   6 month(s)  Provider:   Tereso Newcomer, PA-C         Other Instructions FOLLOW UP WITH YOUR PULMONOLOGY, PLEASE CALL THEM  FOLLOW UP WITH YOUR PRIMARY CARE PHYSICIAN FOR CHEST PAIN AFTER RESTARTING THE PRTONIX     1st Floor: - Lobby - Registration  - Pharmacy  - Lab - Cafe  2nd Floor: - PV Lab -  Diagnostic Testing (echo, CT, nuclear med)  3rd Floor: - Vacant  4th Floor: - TCTS (cardiothoracic surgery) - AFib Clinic - Structural Heart Clinic - Vascular Surgery  - Vascular Ultrasound  5th Floor: - HeartCare Cardiology (general and EP) - Clinical Pharmacy for coumadin, hypertension, lipid, weight-loss medications, and med management appointments    Valet parking services will be available as well.

## 2024-01-28 NOTE — Assessment & Plan Note (Signed)
 Recent trip to the ED with chest pain. His hsT were neg x 2. His CCTA shows nonobstructive CAD. TPV is high. He continues to have chest pain from time to time. He has improvement with NTG. He was on PPI but has been out for a while. Question if he has microvascular ischemia vs GERD contributing. He also has emphysema and notes chronic cough. I think his lung disease may also be contributing.  -Resume Protonix  40 mg once daily  -Follow up wtih PCP to decide on +/- GI referral -Follow up with Pulmonology -Continue ASA 81 mg once daily, Imdur  15 mg once daily, Metoprolol  succinate 25 mg once daily, PRN NTG -Continue aggressive RF management. -Follow up 6 mos.

## 2024-01-28 NOTE — Assessment & Plan Note (Signed)
 Follow up with pulmonology as noted.

## 2024-02-05 ENCOUNTER — Other Ambulatory Visit: Payer: Self-pay | Admitting: Physician Assistant

## 2024-02-09 ENCOUNTER — Ambulatory Visit: Payer: Medicare HMO | Admitting: Family Medicine

## 2024-02-09 ENCOUNTER — Encounter: Payer: Self-pay | Admitting: Family Medicine

## 2024-02-09 ENCOUNTER — Ambulatory Visit
Admission: RE | Admit: 2024-02-09 | Discharge: 2024-02-09 | Disposition: A | Discharge status: Home / Self Care | Payer: Medicare HMO | Source: Ambulatory Visit | Attending: Family Medicine | Admitting: Family Medicine

## 2024-02-09 VITALS — BP 120/60 | HR 60 | Wt 129.4 lb

## 2024-02-09 DIAGNOSIS — M25512 Pain in left shoulder: Secondary | ICD-10-CM

## 2024-02-09 MED ORDER — TRAMADOL HCL 50 MG PO TABS: 50.0000 mg | ORAL_TABLET | Freq: Two times a day (BID) | ORAL | 0 refills | Status: DC | PRN

## 2024-02-09 NOTE — Patient Instructions (Signed)
Take 2 extra strength Tylenol 3 times per day regularly.

## 2024-02-09 NOTE — Progress Notes (Signed)
   Subjective:    Patient ID: Tony Mcmillan, male    DOB: [birth year], 83 y.o.   MRN: [id number]  HPI He is here for evaluation of shoulder pain.  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  He also does complain of some slight discomfort in the left wrist.  He has been using tramadol that was given to him for the carpal and cubital tunnel problems which he states did help slightly.  He would like to see a Scientist, water quality.  He has seen Dr. Ophelia Charter in the past.   Review of Systems     Objective:    Physical Exam Discomfort with palpation of the left shoulder and pain with motion in any direction of the shoulder.  Could not do a full exam due to the pain.       Assessment & Plan:  Left shoulder pain, unspecified chronicity - Plan: Ambulatory referral to Orthopedic Surgery, traMADol (ULTRAM) 50 MG tablet Recommend 2 extra strength Tylenol 3 times per day and use of tramadol if needed. I explained to him that I think that his pain is more shoulder and that is what Dr. Ophelia Charter was mostly concerned with but now I think this is more of a shoulder issue.

## 2024-02-09 NOTE — Addendum Note (Signed)
Addended by: Herminio Commons A on: 02/09/2024 04:26 PM   Modules accepted: Orders

## 2024-02-12 ENCOUNTER — Telehealth: Payer: Self-pay | Admitting: Orthopedic Surgery

## 2024-02-12 NOTE — Telephone Encounter (Signed)
Patient request a sooner appt than 02/26/24, states he's taking tylenol(every 6 hrs) and Tramadol (every 12hrs) for the pain and its not helping.

## 2024-02-16 ENCOUNTER — Ambulatory Visit: Payer: Self-pay | Admitting: Nurse Practitioner

## 2024-02-16 NOTE — Telephone Encounter (Signed)
 IC to offer different appointment, did not have anything that could accommodate patient. He stated he will keep current appt and check back with [place] for cancellation that may fit his schedule.

## 2024-02-16 NOTE — Telephone Encounter (Signed)
 Chief Complaint: Left shoulder pain Symptoms: Left shoulder pain Frequency: a few months Pertinent Negatives: Patient denies n/a Disposition: [] ED /[] Urgent Care (no appt availability in office) / [x] Appointment(In office/virtual)/ []  [place]/ [] Home Care/ [] Refused Recommended Disposition /[] [facility]/ []  Follow-up with PCP Additional Notes: Patient's son initially called in to review recent imaging results of left shoulder. 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 Patient was conferenced in to phone call and patient's son requesting patient be seen in office soon to discuss further pain control or pain shot.    Copied from CRM [id number]. Topic: Clinical - Red Word Triage >> Feb 16, 2024  3:02 PM [name] wrote: Red Word that prompted transfer to Nurse Triage: The patient's son, Tony Mcmillan, was calling regarding the patient's imaging results. However, he advised that the patient is still in severe shoulder pain while taking the Tramadol and Tylenol that was prescribed. Reason for Disposition  Shoulder pain is a chronic symptom (recurrent or ongoing AND present > 4 weeks)  Answer Assessment - Initial Assessment Questions 1. ONSET: "When did the pain start?"     A few months ago 2. LOCATION: "Where is the pain located?"     Left shoulder pain 3. PAIN: "How bad is the pain?" (Scale 1-10; or mild, moderate, severe)   - MILD (1-3): doesn't interfere with normal activities   - MODERATE (4-7): interferes with normal activities (e.g., work or school) or awakens from sleep   - SEVERE (8-10): excruciating pain, unable to do any normal activities, unable to move arm at all due to pain     10 4. WORK OR EXERCISE: "Has there been any recent work or exercise that involved this part of the body?"     No 5. CAUSE: "What do you think is causing the shoulder pain?"     Recent imaging shows osteoarthrosis of left shoulder joint 6. OTHER SYMPTOMS: "Do you have any  other symptoms?" (e.g., neck pain, swelling, rash, fever, numbness, weakness)     Mild swelling  Protocols used: Shoulder Pain-A-AH

## 2024-02-17 ENCOUNTER — Encounter: Payer: Self-pay | Admitting: Family Medicine

## 2024-02-17 ENCOUNTER — Ambulatory Visit: Payer: Medicare HMO | Admitting: Physician Assistant

## 2024-02-17 ENCOUNTER — Ambulatory Visit: Payer: Self-pay | Admitting: Nurse Practitioner

## 2024-02-17 NOTE — Telephone Encounter (Signed)
 Chief Complaint: Left shoulder pain Frequency: Constant Disposition: [] ED /[] Urgent Care (no appt availability in office) / [x] Appointment(In office/virtual)/ []  [facility]/ [] Home Care/ [] Refused Recommended Disposition /[] [facility] [facility]/ []  Follow-up with PCP Additional Notes: Spoke with pt daughter, Corrie Dandy, and pt during this call. Pt has chronic left shoulder pain and has an appt at PCP office on 02/23/24. Pt calling to see if he can get in sooner. Pt told that at this time the first available at his PCP office location is 3/3. Pt offered another office to be seen today but pt denied. Pt offered to help schedule at an urgent care location but pt denied. Pt told the office will be notified and asked to put pt on cancellation list. This RN educated pt on home care, new-worsening symptoms, when to call back/seek emergent care. Pt verbalized understanding and agrees to plan.   Copied from CRM [id number]. Topic: Clinical - Red Word Triage >> Feb 17, 2024  9:35 AM Shelah Lewandowsky wrote: Red Word that prompted transfer to Nurse Triage: severe pain in left shoulder/arm, unable to use it Reason for Disposition  Shoulder pain is a chronic symptom (recurrent or ongoing AND present > 4 weeks)  Answer Assessment - Initial Assessment Questions 1. ONSET: "When did the pain start?"     Ongoing issue, spoke with RN yesterday 2. LOCATION: "Where is the pain located?"     Left shoulder 3. PAIN: "How bad is the pain?" (Scale 1-10; or mild, moderate, severe)   - MILD (1-3): doesn't interfere with normal activities   - MODERATE (4-7): interferes with normal activities (e.g., work or school) or awakens from sleep   - SEVERE (8-10): excruciating pain, unable to do any normal activities, unable to move arm at all due to pain     10  Protocols used: Shoulder Pain-A-AH

## 2024-02-18 ENCOUNTER — Ambulatory Visit (INDEPENDENT_AMBULATORY_CARE_PROVIDER_SITE_OTHER): Payer: Medicare HMO | Admitting: Medical

## 2024-02-18 VITALS — BP 120/68 | HR 74 | Temp 97.5°F | Wt 126.8 lb

## 2024-02-18 DIAGNOSIS — M542 Cervicalgia: Secondary | ICD-10-CM

## 2024-02-18 DIAGNOSIS — M25512 Pain in left shoulder: Secondary | ICD-10-CM

## 2024-02-18 DIAGNOSIS — R29898 Other symptoms and signs involving the musculoskeletal system: Secondary | ICD-10-CM | POA: Diagnosis not present

## 2024-02-18 DIAGNOSIS — G8929 Other chronic pain: Secondary | ICD-10-CM

## 2024-02-18 DIAGNOSIS — M25532 Pain in left wrist: Secondary | ICD-10-CM

## 2024-02-18 DIAGNOSIS — M549 Dorsalgia, unspecified: Secondary | ICD-10-CM | POA: Diagnosis not present

## 2024-02-18 MED ORDER — HYDROCODONE-ACETAMINOPHEN 5-325 MG PO TABS: 1.0000 | ORAL_TABLET | Freq: Two times a day (BID) | ORAL | 0 refills | Status: DC

## 2024-02-18 NOTE — Progress Notes (Signed)
 Subjective:  Tony Mcmillan is a 83 y.o. male who presents for Chief Complaint  Patient presents with   left shoulder pain    Left shoulder pain and left wrist pain, getting worse. Has an appointment with Dr. August Saucer on 3/6 but pain is severe and can't wait that long without some relief. Was given tramadol last week by Dr. Susann Givens but it didn't help with pain     Here for left shoulder pain and left wrist pain.  He was seen here 02/09/2024 for the same.  He notes that is quite significant pain in the left shoulder and left upper back.  No recent injury, fall or trauma  He has had chronic pains in left shoulder and left for years and has had prior neck surgery 2016, history of neck fusion and history of carpal tunnel release on the left  Despite recommendations last visit about 10 days ago , his pain is still quite significant.  He has an appointment coming up with orthopedics.  He has history of rheumatoid arthritis, sees [facility] rheumatology, on Remicade and [place]  He has tried topical remedies and that does not help either.  No fever, no bodyaches or chills,   He lives alone.  He drives.    No other aggravating or relieving factors.    No other c/o.  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 [facility].     Atypical chest pain    a. 09/2016 MV: EF 63%, normal perfusion.   Benign prostatic hyperplasia 04/03/2016   Asymptomatic. Normal PSA and exam by Dr. [facility] 01/2016.     Bilateral carotid bruits 02/09/2019   Bladder wall thickening 03/10/2020   CAD (coronary artery disease)    Coronary artery calcification on Chest CT in 09/2016 and 01/2019 // Myoview 10/17: normal perfusion // Myoview 01/2019: no ischemia or infarction, EF 58 // Coronary CTA 03/2020: Calcium  Score 1067; non-obstructive CAD [oLM 1-24, LAD ost 25049, mid and dist 1-24; D1 1-24; LCx prox and mid 1-24, OM1 and OM2 25-49, RCA and PLB 1-24; PLA 1-24]    Carotid bruit    Carotid Dopplers 01/2019: no ICA stenosis bilaterally    Chronic wrist pain 07/02/2016   Colonic polyp 02/23/2016   COVID-19    DDD (degenerative disc disease), 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 L4-5 and L5-S1    Diastolic dysfunction    a. 2015 Echo: EF >55%;  b. 09/2016 Echo: EF 55-60%, no rwma, Gr1 DD, triv AI/MR, mild TR, PASP .   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   On CT 01/2016    Palpitations    Echo 10/17: Mild focal basal septal hypertrophy, EF 55-60, no RWMA, Gr 1 DD, trivial MR, normal RVSF, mild TR, PASP 28 // Event monitor 01/2019: NSR, PACs   Prediabetes  10/29/2018   Pulmonary emphysema (HCC) 12/18/2016   Renal cyst, left 04/03/2016   On CT, benign    Rheumatoid arthritis (HCC)    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 3 each 1   aspirin EC (ASPIRIN LOW DOSE) 81 MG tablet TAKE 1 TABLET BY MOUTH DAILY *SWALLOW WHOLE* 90 tablet 3   atorvastatin  (LIPITOR) 40 MG tablet Take 1 tablet (40 mg total) by mouth daily. 90 tablet 3   Calcium Carb-Cholecalciferol (CALCIUM-VITAMIN D3) 600-400 MG-UNIT TABS Take 1 tablet by mouth 2 (two) times daily.     docusate sodium (COLACE) 250 MG capsule Take 250 mg by mouth daily.     Glycopyrrolate-Formoterol (BEVESPI AEROSPHERE) 9-4.8 MCG/ACT AERO Inhale 2 puffs into the lungs in the morning and at bedtime. 3 each 1   isosorbide mononitrate (IMDUR) 30 MG 24 hr tablet TAKE 0.5 TABLETS BY MOUTH DAILY. PLEASE CALL OFFICE TO SCHEDULE AN APPT FOR FURTHER REFILLS. 45 tablet 0   latanoprost (XALATAN) 0.005 % ophthalmic solution Place 1 drop into both eyes at bedtime.     leflunomide (ARAVA) 20 MG tablet Take 20 mg by mouth daily.     metoprolol succinate (TOPROL-XL) 25 MG 24 hr tablet Take 1 tablet (25 mg total) by mouth daily. 90 tablet 2   MYRBETRIQ 50 MG TB24 tablet Take 50 mg by mouth daily.      nitroGLYCERIN (NITROSTAT) 0.4 MG SL tablet PLACE 1 TABLET UNDER THE TONGUE EVERY 5 MINUTES AS NEEDED FOR CHEST PAIN. 25 tablet 0   pantoprazole (PROTONIX) 40 MG tablet Take 1 tablet (40 mg total) by mouth daily. 90 tablet 3   polyethylene glycol (MIRALAX) 17 g packet Take 17 g by mouth daily as needed. 14 each 0   REMICADE 100 MG injection      No current facility-administered medications on file prior to visit.   Past Surgical History:  Procedure Laterality Date   ANTERIOR CERVICAL CORPECTOMY     ANTERIOR CERVICAL CORPECTOMY  12/2014   for infection. this was in [place]   CARPAL TUNNEL RELEASE Left 09/29/2023   CARPAL TUNNEL RELEASE Left    CATARACT EXTRACTION     both eyes   COLONOSCOPY     COLOSTOMY REVERSAL     had infection on buttocks and had to have a skin graft- had colostomy to help area stay clean and heal   HERNIA REPAIR     POLYPECTOMY     SPINE SURGERY      The following portions of the patient's history were reviewed and updated as appropriate: allergies, current medications, past family history,  past medical history, past social history, past surgical history and problem list.  ROS Otherwise as in subjective above    Objective: BP 120/68   Pulse 74   Temp (!) 97.5 F (36.4 C)   Wt 126 lb 12.8 oz (57.5 kg)   BMI 23.19 kg/m   Wt Readings from Last 3 Encounters:  02/18/24 126 lb 12.8 oz (57.5 kg)  02/09/24 129 lb 6.4 oz (58.7 kg)  01/28/24 189 lb 9.6 oz (86 kg)    General appearance: alert,  no distress, well developed, well nourished Tender in left lateral and posterior neck, tender left upper back, spasm in both areas, range of motion of neck reduced in all directions, reduced at least 50% in each direction, no mass or thyromegaly or lymphadenopathy Back tender left upper back paraspinal region spasm.  Otherwise back nontender Musculoskeletal: Tender over the left anterior and lateral shoulder at [facility] joint, nontender rest of the arm, range of motion quite reduced with left shoulder.  Shoulder flexion to about 50 degrees, but quite decreased internal and external range of motion of left shoulder. Muscle atrophy throughout the upper extremities noted Upper extremity pulses within normal limits Grip strength is definitely weak bilaterally, upper arm strength within normal limits No obvious swelling No skin changes, unremarkable    Assessment: Encounter Diagnoses  Name Primary?   Upper back pain on left side Yes   Chronic left shoulder pain    Neck pain    Decreased ROM of neck    Wrist pain, chronic, left      Plan: We discussed the causes of his pain.  He has underlying rheumatoid arthritis, history of neck surgery and fusion, history of carpal tunnel release on the left.  Current symptoms could be due to cervical spine issue radicular issues versus spasm and tension in the upper back, strain, underlying arthritis of the shoulder or other.  I reviewed left shoulder x-rays with every 2/24/ 2025 as well as MRI cervical spine from July 2024 last year.  He is  definitely tender in the upper back muscles today and spasm present, tense in the neck as well which may be the cause that is worse pain and recent on top of his chronic shoulder pain  Referral today to physical therapy.  Ultram was not helping needed was Tylenol or topical rubs.  We would do some short-term Norco hydrocodone.  Caution on sedation.  We discussed fall risk.  He will only use this in the evening or when he is going to be at home in the bed for a little while  Advised stretching, some heat to the upper back if desired.  Continue plan to see orthopedic on February 26, 2024 as scheduled  Follow-up with your rheumatologist as well    Brailen was seen today for left shoulder pain.  Diagnoses and all orders for this visit:  Upper back pain on left side -     Ambulatory referral to Physical Therapy  Chronic left shoulder pain -     Ambulatory referral to Physical Therapy  Neck pain -     Ambulatory referral to Physical Therapy  Decreased ROM of neck -     Ambulatory referral to Physical Therapy  Wrist pain, chronic, left -     Ambulatory referral to Physical Therapy  Other orders -     HYDROcodone-acetaminophen (NORCO/VICODIN) 5-325 MG tablet; Take 1 tablet by mouth in the morning and at bedtime.    Follow up: with ortho

## 2024-02-18 NOTE — Patient Instructions (Signed)
 Recommendations: Begin Hydrocodone norco pain medication for the next few days as needed. Use either 1/2-1 tablet no more than twice daily the next few days to give pain relief For milder pain, use Tylenol over the counter, or short term Aleve over the counter once or twice daily for a few days Use heat such as hot or warm towel to left upper back I have placed an urgent referral to physical therapy which I think can give you some relief.  Follow up as planned with orthopedics Follow up as planned with your rheumatologist

## 2024-02-22 ENCOUNTER — Encounter: Payer: Self-pay | Admitting: Oncology

## 2024-02-23 ENCOUNTER — Ambulatory Visit: Payer: Medicare HMO | Admitting: Medical

## 2024-02-26 ENCOUNTER — Other Ambulatory Visit: Payer: Self-pay

## 2024-02-26 ENCOUNTER — Encounter: Payer: Self-pay | Admitting: Oncology

## 2024-02-26 ENCOUNTER — Ambulatory Visit (INDEPENDENT_AMBULATORY_CARE_PROVIDER_SITE_OTHER): Payer: Medicare HMO | Admitting: Orthopedic Surgery

## 2024-02-26 DIAGNOSIS — M25512 Pain in left shoulder: Secondary | ICD-10-CM

## 2024-02-26 DIAGNOSIS — M19012 Primary osteoarthritis, left shoulder: Secondary | ICD-10-CM | POA: Diagnosis not present

## 2024-02-27 ENCOUNTER — Encounter: Payer: Self-pay | Admitting: Orthopedic Surgery

## 2024-02-27 NOTE — Progress Notes (Signed)
 Office Visit Note   Patient: Tony Mcmillan           Date of Birth: 06-30-1941           MRN: [id number] Visit Date: 02/26/2024 Requested by: Ronnald Nian, MD [address] [place],  [place] [address] PCP: Tollie Eth, NP  Subjective: Chief Complaint  Patient presents with   Left Shoulder - Pain    HPI: Tony Mcmillan is a 83 y.o. male who presents to the office reporting left shoulder pain for a long duration.  Ambulates with a cane in the right upper extremity.  Describes some radiating pain with numbness and tingling in the fingers on the left and right-hand side.  Also describes some neck pain.  Reports decreased range of motion in the shoulder as well.  Does have a history of coronary artery disease.  Has tried Norco from his primary care provider without relief.  Symptoms ongoing for 5 months.  Has been to physical therapy.  Right shoulder no problem..                ROS: All systems reviewed are negative as they relate to the chief complaint within the history of present illness.  Patient denies fevers or chills.  Assessment & Plan: Visit Diagnoses:  1. 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  Glenohumeral joint injection performed today.  Depending on how he does with that injection he may consider shoulder replacements.  That is a lot to go through for someone at his age but he does have a very significantly painful left shoulder.  He does have a history of extensive cervical spine surgery as well and some of the numbness and tingling in his hands could be coming from that.  Follow-up in 6 weeks for clinical recheck and decision for or against shoulder replacement.  Tolerated the injection today.  Follow-Up Instructions: No follow-ups on file.   Orders:  Orders Placed This Encounter  Procedures   US Guided Needle Placement - No Linked Charges   No orders of the defined types were placed in this encounter.      Procedures: Large Joint Inj: L glenohumeral on 02/26/2024 6:19 PM Indications: diagnostic evaluation and pain Details: 22 G 3.5 in needle, ultrasound-guided posterior approach  Arthrogram: No  Medications: 9 mL bupivacaine 0.5 %; 40 mg methylPREDNISolone acetate 40 MG/ML; 5 mL lidocaine 1 % Outcome: tolerated well, no immediate complications Procedure, treatment alternatives, risks and benefits explained, specific risks discussed. Consent was given by the patient. Immediately prior to procedure a time out was called to verify the correct patient, procedure, equipment, support staff and site/side marked as required. Patient was prepped and draped in the usual sterile fashion.       Clinical Data: No additional findings.  Objective: Vital Signs: There were no vitals taken for this visit.  Physical Exam:  Constitutional: Patient appears well-developed HEENT:  Head: Normocephalic Eyes:EOM are normal Neck: Normal range of motion Cardiovascular: Normal rate Pulmonary/chest: Effort normal Neurologic: Patient is alert Skin: Skin is warm Psychiatric: Patient has normal mood and affect  Ortho Exam: Ortho exam demonstrates range of motion on the right of 65/100/160 range of motion on the left is 15/70/90.  Rotator cuff strength is actually pretty reasonable on the left-hand side infraspinatus and supraspinatus and subscap muscle testing.  No Popeye deformity.  Axillary nerve functions.  Coarseness as expected is present with passive range  of motion.  No masses lymphadenopathy or skin changes noted in that left shoulder region.  Specialty Comments:  Narrative & Impression [facility]   NEUROIMAGING REPORT     STUDY DATE: 07/01/23 PATIENT NAME: Tony Mcmillan DOB: 06-Jun-1941 MRN: [id number]   ORDERING CLINICIAN: Anson Fret, MD  CLINICAL HISTORY: 83 y.o. year old male with:  1. Left hand pain  2. Cervical radiculopathy  3. Fall, initial encounter  4. Pain of  neck with recent traumatic injury  5. Left arm numbness  6. Left arm weakness  7. Decreased ROM of neck  8. At high risk for fracture  9. Rheumatoid arthritis, involving unspecified site, unspecified whether rheumatoid factor present (HCC)      EXAM: MR CERVICAL SPINE WO CONTRAST  TECHNIQUE: MRI of the cervical spine was obtained utilizing multiplanar, multiecho pulse sequences. CONTRAST:   Diagnostic Product Medications (last 72 hours)       None        COMPARISON: 03/22/23 CT   IMAGING SITE: [facility] AT [address] [place] [address]       FINDINGS:    On sagittal views the vertebral bodies have normal height and alignment.  Anterior cervical discectomy and fusion with metal hardware (anteriorly and posteriorly) spanning C2-C6 levels with interbody fusion device spanning C3-C5 vertebral bodies. 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  The posterior fossa, pituitary gland and paraspinal soft tissues are unremarkable.    On axial views: C2-3 no spinal stenosis or foraminal narrowing C3-4 no spinal stenosis or foraminal narrowing C4-5 no spinal stenosis or foraminal narrowing  C5-6 no spinal stenosis or foraminal narrowing C6-7 no spinal stenosis or foraminal narrowing C7-T1 disc bulging and uncovertebral joint hypertrophy with severe bilateral foraminal stenosis T1-2 disc bulging and facet hypertrophy with mild bilateral foraminal stenosis T2-3 no spinal stenosis or foraminal narrowing   Limited views of the soft tissues of the head and neck are unremarkable.     IMPRESSION:    MRI cervical spine (without) demonstrating: - Cervical discectomy and fusion spanning C2-C6 with anterior and posterior hardware and interbody fusion device from C3-C5 levels. - At C7-T1 disc bulging and uncovertebral joint hypertrophy with severe bilateral foraminal stenosis. - At T1-2 disc  bulging and facet hypertrophy with mild bilateral foraminal stenosis.       INTERPRETING PHYSICIAN:  Suanne Marker, MD Certified in Neurology, Neurophysiology and Neuroimaging   [facility] [address] [place], [place] [address] [phone]  Imaging: No results found.   PMFS History: Patient Active Problem List   Diagnosis Date Noted   Precordial chest pain 01/04/2024   Carpal tunnel syndrome, left upper limb 09/15/2023   Cubital tunnel syndrome on left 09/15/2023   Iron deficiency anemia due to chronic blood loss 06/02/2023   Occipital neuralgia of left side 03/26/2023   COPD (chronic obstructive pulmonary disease) (HCC) 08/08/2022   Olecranon bursitis of right elbow 05/24/2022   Other long term (current) drug therapy 05/09/2022   Pulmonary fibrosis (HCC) 05/09/2022   Absolute anemia 05/09/2022   Mixed hyperlipidemia 01/21/2022   Premature atrial contractions 01/20/2022   CAD (coronary artery disease)    Enlarged prostate 03/10/2020   Aortic valve calcification 03/10/2020   Bladder wall thickening 03/10/2020   Weight loss 11/15/2019   Bilateral carotid bruits 02/09/2019   History of systemic steroid therapy 10/30/2017   Pulmonary emphysema (HCC) 12/18/2016  Rheumatoid arthritis (HCC) 11/04/2016   High risk medication use 11/04/2016   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   Vitamin D deficiency 11/04/2016   Asymptomatic microscopic hematuria 04/03/2016   Benign prostatic hyperplasia 04/03/2016   Renal cyst, left 04/03/2016   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 [facility]ryl [facility].      Atypical chest pain    a. 09/2016 MV: EF 63%, normal perfusion.   Benign prostatic hyperplasia 04/03/2016   Asymptomatic. Normal PSA and exam by Dr. Sherryl [facility] 01/2016.     Bilateral carotid bruits 02/09/2019   Bladder wall thickening 03/10/2020   CAD (coronary artery disease)    Coronary artery calcification on Chest CT in 09/2016 and 01/2019 // Myoview 10/17: normal perfusion // Myoview 01/2019: no ischemia or infarction, EF 58 // Coronary CTA 03/2020: Calcium Score 1067; non-obstructive CAD [oLM 1-24, LAD ost 25049, mid and dist 1-24; D1 1-24; LCx prox and mid 1-24, OM1 and OM2 25-49, RCA and PLB 1-24; PLA 1-24]    Carotid bruit    Carotid Dopplers 01/2019: no ICA stenosis bilaterally    Chronic wrist pain 07/02/2016   Colonic polyp 02/23/2016   COVID-19    DDD (degenerative disc disease), 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 L4-5 and L5-S1    Diastolic dysfunction    a. 2015 Echo: EF >55%;  b. 09/2016 Echo: EF 55-60%, no rwma, Gr1 DD, triv AI/MR, mild TR, PASP .   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   On CT 01/2016    Palpitations    Echo 10/17: Mild focal basal septal hypertrophy, EF 55-60, no RWMA, Gr 1 DD, trivial MR, normal RVSF, mild TR, PASP 28 // Event monitor 01/2019: NSR, PACs   Prediabetes 10/29/2018   Pulmonary emphysema (HCC) 12/18/2016   Renal cyst, left 04/03/2016   On CT, benign    Rheumatoid arthritis (HCC)    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   -RF,-CCP,-1433eta,  erosive disease with contractures   Tubular adenoma of colon 01/2016   Vitamin D deficiency 11/04/2016    Family History  Problem Relation Age of Onset   Hypertension Mother    Hypertension Father    Diabetes Father    Heart disease Father        stents   Hyperlipidemia Sister    Diabetes Sister    Stroke Brother    Diabetes Brother    Heart disease Brother        stents   Colon polyps Neg Hx    Esophageal cancer Neg Hx    Stomach cancer Neg Hx    Rectal cancer Neg Hx    Colon cancer Neg Hx  Past Surgical History:  Procedure Laterality Date   ANTERIOR CERVICAL CORPECTOMY     ANTERIOR CERVICAL CORPECTOMY  12/2014   for infection. this was in [place]   CARPAL TUNNEL RELEASE Left 09/29/2023   CARPAL TUNNEL RELEASE Left    CATARACT EXTRACTION     both eyes   COLONOSCOPY     COLOSTOMY REVERSAL     had infection on buttocks and had to have a skin graft- had colostomy to help area stay clean and heal   HERNIA REPAIR     POLYPECTOMY     SPINE SURGERY     Social History   Occupational History   Not on file  Tobacco Use   Smoking status: Former    Current packs/day: 0.00    Average packs/day: 1 pack/day for 17.0 years (17.0 ttl pk-yrs)    Types: Cigarettes    Start date: 12/23/1976    Quit date: 12/23/1993    Years since quitting: 30.2   Smokeless tobacco: Never  Vaping Use   Vaping status: Never Used  Substance and Sexual Activity   Alcohol use: No    Alcohol/week: 0.0 standard drinks of alcohol   Drug use: No   Sexual activity: Yes

## 2024-02-28 MED ORDER — METHYLPREDNISOLONE ACETATE 40 MG/ML IJ SUSP
40.0000 mg | INTRAMUSCULAR | Status: AC | PRN
  Administered 2024-02-26: 40 mg via INTRA_ARTICULAR

## 2024-02-28 MED ORDER — BUPIVACAINE HCL 0.5 % IJ SOLN
9.0000 mL | INTRAMUSCULAR | Status: AC | PRN
  Administered 2024-02-26: 9 mL via INTRA_ARTICULAR

## 2024-02-28 MED ORDER — LIDOCAINE HCL 1 % IJ SOLN
5.0000 mL | INTRAMUSCULAR | Status: AC | PRN
  Administered 2024-02-26: 5 mL

## 2024-03-02 ENCOUNTER — Other Ambulatory Visit: Payer: Medicare HMO

## 2024-03-02 ENCOUNTER — Ambulatory Visit: Payer: Medicare HMO | Admitting: Oncology

## 2024-03-08 ENCOUNTER — Encounter: Payer: Self-pay | Admitting: Oncology

## 2024-03-10 ENCOUNTER — Encounter: Payer: Self-pay | Admitting: Oncology

## 2024-03-11 ENCOUNTER — Ambulatory Visit: Admitting: Surgical

## 2024-03-11 ENCOUNTER — Encounter: Payer: Self-pay | Admitting: Oncology

## 2024-03-11 DIAGNOSIS — M5412 Radiculopathy, cervical region: Secondary | ICD-10-CM

## 2024-03-11 DIAGNOSIS — M19012 Primary osteoarthritis, left shoulder: Secondary | ICD-10-CM

## 2024-03-11 MED ORDER — GABAPENTIN 100 MG PO CAPS: 200.0000 mg | ORAL_CAPSULE | Freq: Every day | ORAL | 0 refills | Status: DC

## 2024-03-12 ENCOUNTER — Encounter: Payer: Self-pay | Admitting: Surgical

## 2024-03-12 NOTE — Progress Notes (Signed)
 Follow-up Office Visit Note   Patient: Tony Mcmillan           Date of Birth: Dec 28, 1940           MRN: [id number] Visit Date: 03/11/2024 Requested by: Tollie Eth, NP [address] [place],  [place] [address] PCP: Tollie Eth, NP  Subjective: Chief Complaint  Patient presents with   Left Shoulder - Follow-up    S/p [facility] injection     Neck - Pain    HPI: Tony Mcmillan is a 83 y.o. male who returns to the office for follow-up visit.    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 Glenohumeral joint injection performed today. Depending on how he does with that injection he may consider shoulder replacements. That is a lot to go through for someone at his age but he does have a very significantly painful left shoulder. He does have a history of extensive cervical spine surgery as well and some of the numbness and tingling in his hands could be coming from that. Follow-up in 6 weeks for clinical recheck and decision for or against shoulder replacement. Tolerated the injection today.   Since then, patient notes he got about 80 to 90% relief of his shoulder pain with glenohumeral injection at last visit but unfortunately this only lasted for about 1 week.  Pain has now returned and again causing him distress though overall the pain level is not as bad as it was prior to the injection just yet.  He also complains of numbness and tingling on the dorsum of his left hand with constant aching throbbing pain and occasional lightening sensation in the left arm.                ROS: All systems reviewed are negative as they relate to the chief complaint within the history of present illness.  Patient denies fevers or chills.  Assessment & Plan: Visit Diagnoses: No diagnosis found.  Plan: Tony Mcmillan is a 83 y.o. male who returns to the office for follow-up visit.  Plan from last visit was noted above in HPI.  They now return with significant relief from  glenohumeral injection but only lasting for about 1 week.  He is not at the point where he definitively wants shoulder surgery but he states that he is not sure he can live with this shoulder pain as is.  Regardless, it would be too soon to pursue shoulder replacement until about 3 months out from this injection that he had on 02/26/2024.  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  Follow-up after ESI to see how this does for him and further discuss possibility of shoulder replacement.  Also prescribe gabapentin as this will give him some relief.  Follow-Up Instructions: No follow-ups on file.   Orders:  No orders of the defined types were placed in this encounter.  Meds ordered this encounter  Medications   gabapentin (NEURONTIN) 100 MG capsule    Sig: Take 2 capsules (200 mg total) by mouth at bedtime.    Dispense:  40 capsule    Refill:  0      Procedures: No procedures performed   Clinical Data: No additional findings.  Objective: Vital  Signs: There were no vitals taken for this visit.  Physical Exam:  Constitutional: Patient appears well-developed HEENT:  Head: Normocephalic Eyes:EOM are normal Neck: Normal range of motion Cardiovascular: Normal rate Pulmonary/chest: Effort normal Neurologic: Patient is alert Skin: Skin is warm Psychiatric: Patient has normal mood and affect  Ortho Exam: Ortho exam demonstrates left hand with intact EPL, FPL, finger abduction, pronation/supination, bicep, tricep, deltoid without significant weakness.  Negative Hoffmann sign.  Axillary nerve intact with deltoid firing.  No tenderness over the [facility] joint.  Mild to moderate tenderness over the bicipital groove.  2+ radial pulse of the left upper  extremity.  Specialty Comments:  Narrative & Impression [facility]   NEUROIMAGING REPORT     STUDY DATE: 07/01/23 PATIENT NAME: Saber Dickerman DOB: 1941-08-06 MRN: [id number]   ORDERING CLINICIAN: Anson Fret, MD  CLINICAL HISTORY: 83 y.o. year old male with:  1. Left hand pain  2. Cervical radiculopathy  3. Fall, initial encounter  4. Pain of neck with recent traumatic injury  5. Left arm numbness  6. Left arm weakness  7. Decreased ROM of neck  8. At high risk for fracture  9. Rheumatoid arthritis, involving unspecified site, unspecified whether rheumatoid factor present (HCC)      EXAM: MR CERVICAL SPINE WO CONTRAST  TECHNIQUE: MRI of the cervical spine was obtained utilizing multiplanar, multiecho pulse sequences. CONTRAST:   Diagnostic Product Medications (last 72 hours)       None        COMPARISON: 03/22/23 CT   IMAGING SITE: [facility] AT [address]  [place] [address]       FINDINGS:    On sagittal views the vertebral bodies have normal height and alignment.  Anterior cervical discectomy and fusion with metal hardware (anteriorly and posteriorly) spanning C2-C6 levels with interbody fusion device spanning C3-C5 vertebral bodies. 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  The posterior fossa, pituitary gland and paraspinal soft tissues are unremarkable.    On axial views: C2-3 no spinal stenosis or foraminal narrowing C3-4 no spinal stenosis or foraminal narrowing C4-5 no spinal stenosis or foraminal narrowing  C5-6 no spinal stenosis or foraminal narrowing C6-7 no spinal stenosis or foraminal narrowing C7-T1 disc bulging and uncovertebral joint hypertrophy with severe bilateral foraminal stenosis T1-2 disc bulging and facet hypertrophy with mild bilateral foraminal stenosis T2-3 no spinal stenosis or foraminal narrowing   Limited  views of the soft tissues of the head and neck are unremarkable.     IMPRESSION:    MRI cervical spine (without) demonstrating: - Cervical discectomy and fusion spanning C2-C6 with anterior and posterior hardware and interbody fusion device from C3-C5 levels. - At C7-T1 disc bulging and uncovertebral joint hypertrophy with severe bilateral foraminal stenosis. - At T1-2 disc bulging and facet hypertrophy with mild bilateral foraminal stenosis.       INTERPRETING PHYSICIAN:  Suanne Marker, MD Certified in Neurology, Neurophysiology and Neuroimaging   [facility] [address] [place], [place] [address] [phone]  Imaging: No results found.   PMFS History: Patient Active Problem List   Diagnosis Date Noted   Precordial chest pain 01/04/2024   Carpal tunnel syndrome, left upper limb 09/15/2023   Cubital tunnel syndrome on left 09/15/2023   Iron deficiency anemia due to chronic blood loss 06/02/2023   Occipital neuralgia of left side 03/26/2023   COPD (chronic obstructive  pulmonary disease) (HCC) 08/08/2022   Olecranon bursitis of right elbow 05/24/2022   Other long term (current) drug therapy 05/09/2022   Pulmonary fibrosis (HCC) 05/09/2022   Absolute anemia 05/09/2022   Mixed hyperlipidemia 01/21/2022   Premature atrial contractions 01/20/2022   CAD (coronary artery disease)    Enlarged prostate 03/10/2020   Aortic valve calcification 03/10/2020   Bladder wall thickening 03/10/2020   Weight loss 11/15/2019   Bilateral carotid bruits 02/09/2019   History of systemic steroid therapy 10/30/2017   Pulmonary emphysema (HCC) 12/18/2016   Rheumatoid arthritis (HCC) 11/04/2016   High risk medication use 11/04/2016   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   Vitamin D deficiency 11/04/2016   Asymptomatic microscopic hematuria 04/03/2016   Benign prostatic hyperplasia 04/03/2016   Renal cyst, left 04/03/2016   Osteoarthritis  of lumbar spine 04/03/2016   DDD (degenerative disc disease), lumbar 04/03/2016   Duodenal ulcer 02/23/2016   Past Medical History:  Diagnosis Date   3-vessel coronary artery disease 03/10/2020   Allergy    Anemia    Aortic valve calcification 03/10/2020   Arthritis    Asymptomatic microscopic hematuria 04/03/2016   Cystoscopy 02/207 Dr. [facility]ryl [facility].     Atypical chest pain    a. 09/2016 MV: EF 63%, normal perfusion.   Benign prostatic hyperplasia 04/03/2016   Asymptomatic. Normal PSA and exam by Dr. Sherryl [facility] 01/2016.     Bilateral carotid bruits 02/09/2019   Bladder wall thickening 03/10/2020   CAD (coronary artery disease)    Coronary artery calcification on Chest CT in 09/2016 and 01/2019 // Myoview 10/17: normal perfusion // Myoview 01/2019: no ischemia or infarction, EF 58 // Coronary CTA 03/2020: Calcium Score 1067; non-obstructive CAD [oLM 1-24, LAD ost 25049, mid and dist 1-24; D1 1-24; LCx prox and mid 1-24, OM1 and OM2 25-49, RCA and PLB 1-24; PLA 1-24]    Carotid bruit    Carotid Dopplers 01/2019: no ICA stenosis bilaterally    Chronic wrist pain 07/02/2016   Colonic polyp 02/23/2016   COVID-19    DDD (degenerative disc disease), 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 L4-5 and L5-S1    Diastolic dysfunction    a. 2015 Echo: EF >55%;  b. 09/2016 Echo: EF 55-60%, no rwma, Gr1 DD, triv AI/MR, mild TR, PASP .   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   On CT 01/2016    Palpitations    Echo  10/17: Mild focal basal septal hypertrophy, EF 55-60, no RWMA, Gr 1 DD, trivial MR, normal RVSF, mild TR, PASP 28 // Event monitor 01/2019: NSR, PACs   Prediabetes 10/29/2018   Pulmonary emphysema (HCC) 12/18/2016   Renal cyst, left 04/03/2016   On CT, benign    Rheumatoid arthritis (HCC)    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   -RF,-CCP,-1433eta, erosive disease with contractures   Tubular adenoma of colon 01/2016   Vitamin D deficiency 11/04/2016    Family History  Problem Relation Age of Onset   Hypertension Mother  Hypertension Father    Diabetes Father    Heart disease Father        stents   Hyperlipidemia Sister    Diabetes Sister    Stroke Brother    Diabetes Brother    Heart disease Brother        stents   Colon polyps Neg Hx    Esophageal cancer Neg Hx    Stomach cancer Neg Hx    Rectal cancer Neg Hx    Colon cancer Neg Hx     Past Surgical History:  Procedure Laterality Date   ANTERIOR CERVICAL CORPECTOMY     ANTERIOR CERVICAL CORPECTOMY  12/2014   for infection. this was in [place]   CARPAL TUNNEL RELEASE Left 09/29/2023   CARPAL TUNNEL RELEASE Left    CATARACT EXTRACTION     both eyes   COLONOSCOPY     COLOSTOMY REVERSAL     had infection on buttocks and had to have a skin graft- had colostomy to help area stay clean and heal   HERNIA REPAIR     POLYPECTOMY     SPINE SURGERY     Social History   Occupational History   Not on file  Tobacco Use   Smoking status: Former    Current packs/day: 0.00    Average packs/day: 1 pack/day for 17.0 years (17.0 ttl pk-yrs)    Types: Cigarettes    Start date: 12/23/1976    Quit date: 12/23/1993    Years since quitting: 30.2   Smokeless tobacco: Never  Vaping Use   Vaping status: Never Used  Substance and Sexual Activity   Alcohol use: No    Alcohol/week: 0.0 standard drinks of alcohol   Drug use: No   Sexual activity: Yes

## 2024-03-15 ENCOUNTER — Other Ambulatory Visit: Payer: Self-pay

## 2024-03-15 DIAGNOSIS — M5412 Radiculopathy, cervical region: Secondary | ICD-10-CM

## 2024-03-19 ENCOUNTER — Ambulatory Visit: Payer: Medicare HMO | Admitting: Adult Health

## 2024-03-19 ENCOUNTER — Encounter: Payer: Self-pay | Admitting: Adult Health

## 2024-03-19 VITALS — BP 106/50 | HR 95 | Temp 97.5°F | Ht 62.0 in | Wt 128.4 lb

## 2024-03-19 DIAGNOSIS — J449 Chronic obstructive pulmonary disease, unspecified: Secondary | ICD-10-CM

## 2024-03-19 MED ORDER — BEVESPI AEROSPHERE 9-4.8 MCG/ACT IN AERO: 2.0000 | INHALATION_SPRAY | Freq: Two times a day (BID) | RESPIRATORY_TRACT | 1 refills | Status: AC

## 2024-03-19 MED ORDER — ALBUTEROL SULFATE HFA 108 (90 BASE) MCG/ACT IN AERS: 2.0000 | INHALATION_SPRAY | Freq: Four times a day (QID) | RESPIRATORY_TRACT | 1 refills | Status: AC | PRN

## 2024-03-19 NOTE — Progress Notes (Signed)
 @Patient  ID: Tony Mcmillan, male    DOB: Jun 18, 1941, 83 y.o.   MRN: [id number]  Chief Complaint  Patient presents with   Follow-up    Referring provider: Tollie Eth, NP  HPI: 83 year old male former smoker followed for COPD with emphysema History of nocturnal hypoxemia on oxygen-stopped oxygen and return to DME. 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  03/19/2024 Follow up : COPD with Emphysema  Discussed the use of AI scribe software for clinical note transcription with the patient, who gave verbal consent to proceed.  History of Present Illness   Tony Mcmillan is an 83 year old male with COPD who presents for a year follow-up.  His breathing is stable with no significant problems. He uses Bevespi twice daily and albuterol as needed, which is infrequent. He confirms that his RSV, Prevnar, pneumococcal and flu vaccines are up to date.  High-resolution CT chest in 2022 showed stable emphysema, no evidence of ILD.   He experiences a dry cough, particularly at night, but there is no sputum production, sinus drainage, or heartburn. He is not taking any medication for the cough.  Regarding his rheumatoid arthritis, he is on Arava (leflunomide) and Remicade. He has trouble with his shoulder and hand, with symptoms of numbness .  He is currently following with orthopedics and he is scheduled to see another doctor next  week for further evaluation.  2D echo January 20, 2024 showed EF at 60 to 65%, mild LVH, right ventricular systolic function normal.  Normal pulmonary artery systolic pressure.  RV size was normal.  Moderate calcification of the aortic valve without stenosis.    Socially, he lives alone but is active, driving, and engaging with his family, which includes seven children and eighteen grandchildren. [place] and receives assistance from his children as needed.       No Known Allergies  Immunization History  Administered Date(s) Administered   Influenza, High Dose Seasonal PF 07/23/2017, 08/05/2019, 08/30/2020, 08/22/2021, 08/10/2022, 08/22/2023   Influenza,inj,Quad PF,6+ Mos 08/01/2017   Influenza-Unspecified 10/30/2015, 09/13/2016, 07/30/2018, 08/05/2019   Moderna Covid-19 Fall Seasonal Vaccine 12yrs & older 02/23/2024   PFIZER Comirnaty(Gray Top)Covid-19 Tri-Sucrose Vaccine 05/12/2021, 09/23/2022   PFIZER(Purple Top)SARS-COV-2 Vaccination 01/28/2020, 02/18/2020, 09/19/2020   PNEUMOCOCCAL CONJUGATE-20 08/17/2021   Pfizer Covid-19 Vaccine Bivalent Booster 80yrs & up 11/05/2021   Pneumococcal Conjugate-13 12/05/2015   Pneumococcal Polysaccharide-23 04/23/2017   Rsv, Bivalent, Protein Subunit Rsvpref,pf Verdis Frederickson) 03/14/2023   Tdap 12/09/2015   Zoster Recombinant(Shingrix) 10/26/2021, 08/20/2022   Zoster, Live 12/09/2015    Past Medical History:  Diagnosis Date   3-vessel coronary artery disease 03/10/2020   Allergy    Anemia    Aortic valve calcification 03/10/2020   Arthritis    Asymptomatic microscopic hematuria 04/03/2016   Cystoscopy 02/207 Dr. Sherryl [facility].     Atypical chest pain    a. 09/2016 MV: EF 63%, normal perfusion.   Benign prostatic hyperplasia 04/03/2016  Asymptomatic. Normal PSA and exam by Dr. Sherryl [facility] 01/2016.     Bilateral carotid bruits 02/09/2019   Bladder wall thickening 03/10/2020   CAD (coronary  artery disease)    Coronary artery calcification on Chest CT in 09/2016 and 01/2019 // Myoview 10/17: normal perfusion // Myoview 01/2019: no ischemia or infarction, EF 58 // Coronary CTA 03/2020: Calcium Score 1067; non-obstructive CAD [oLM 1-24, LAD ost 25049, mid and dist 1-24; D1 1-24; LCx prox and mid 1-24, OM1 and OM2 25-49, RCA and PLB 1-24; PLA 1-24]    Carotid bruit    Carotid Dopplers 01/2019: no ICA stenosis bilaterally    Chronic wrist pain 07/02/2016   Colonic polyp 02/23/2016   COVID-19    DDD (degenerative disc disease), 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 L4-5 and L5-S1    Diastolic dysfunction    a. 2015 Echo: EF >55%;  b. 09/2016 Echo: EF 55-60%, no rwma, Gr1 DD, triv AI/MR, mild TR, PASP .   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   On CT 01/2016    Palpitations    Echo 10/17: Mild focal basal septal hypertrophy, EF 55-60, no RWMA, Gr 1 DD, trivial MR, normal RVSF, mild TR, PASP 28 // Event monitor 01/2019: NSR, PACs   Prediabetes 10/29/2018   Pulmonary emphysema (HCC) 12/18/2016   Renal cyst, left 04/03/2016   On CT, benign    Rheumatoid arthritis (HCC)    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   -RF,-CCP,-[id number], erosive disease with contractures   Tubular adenoma of colon 01/2016   Vitamin D deficiency 11/04/2016    Tobacco History: Social History   Tobacco Use  Smoking Status Former   Current packs/day: 0.00   Average packs/day: 1 pack/day for 17.0 years (17.0 ttl pk-yrs)    Types: Cigarettes   Start date: 12/23/1976   Quit date: 12/23/1993   Years since quitting: 30.2  Smokeless Tobacco Never   Counseling given: Not Answered   Outpatient Medications Prior to Visit  Medication Sig Dispense Refill   albuterol (VENTOLIN HFA) 108 (90 Base) MCG/ACT inhaler Inhale 2 puffs into the lungs every 6 (six) hours as needed for wheezing or shortness of breath. 3 each 1   aspirin EC (ASPIRIN LOW DOSE) 81 MG tablet TAKE 1 TABLET BY MOUTH DAILY *SWALLOW WHOLE* 90 tablet 3   atorvastatin (LIPITOR) 40 MG tablet Take 1 tablet (40 mg total) by mouth daily. 90 tablet 3   Calcium Carb-Cholecalciferol (CALCIUM-VITAMIN D3) 600-400 MG-UNIT TABS Take 1 tablet by mouth 2 (two) times daily.     docusate sodium (COLACE) 250 MG capsule Take 250 mg by mouth daily.     Glycopyrrolate-Formoterol (BEVESPI AEROSPHERE) 9-4.8 MCG/ACT AERO Inhale 2 puffs into the lungs in the morning and at bedtime. 3 each 1   isosorbide mononitrate (IMDUR) 30 MG 24 hr tablet TAKE 0.5 TABLETS BY MOUTH DAILY. PLEASE CALL OFFICE TO SCHEDULE AN APPT FOR FURTHER REFILLS. 45 tablet 0   latanoprost (XALATAN) 0.005 % ophthalmic solution Place 1 drop  into both eyes at bedtime.     leflunomide (ARAVA) 20 MG tablet Take 20 mg by mouth daily.     metoprolol succinate (TOPROL-XL) 25 MG 24 hr tablet Take 1 tablet (25 mg total) by mouth daily. 90 tablet 2   MYRBETRIQ 50 MG TB24 tablet Take 50 mg by mouth daily.      nitroGLYCERIN (NITROSTAT) 0.4 MG SL tablet PLACE 1 TABLET UNDER THE TONGUE EVERY 5 MINUTES AS NEEDED FOR CHEST PAIN. 25 tablet 0   pantoprazole (PROTONIX) 40 MG tablet Take 1 tablet (40 mg total) by mouth daily. 90 tablet 3   polyethylene glycol (MIRALAX) 17 g packet Take 17 g by mouth daily as needed. 14 each 0   REMICADE 100 MG injection      gabapentin (NEURONTIN) 100 MG capsule Take 2 capsules (200 mg total) by mouth at bedtime. 40 capsule 0   HYDROcodone-acetaminophen (NORCO/VICODIN) 5-325 MG tablet Take 1 tablet  by mouth in the morning and at bedtime. 12 tablet 0   No facility-administered medications prior to visit.     Review of Systems:   Constitutional:   No  weight loss, night sweats,  Fevers, chills,+ fatigue, or  lassitude.  HEENT:   No headaches,  Difficulty swallowing,  Tooth/dental problems, or  Sore throat,                No sneezing, itching, ear ache, nasal congestion, post nasal drip,   CV:  No chest pain,  Orthopnea, PND, swelling in lower extremities, anasarca, dizziness, palpitations, syncope.   GI  No heartburn, indigestion, abdominal pain, nausea, vomiting, diarrhea, change in bowel habits, loss of appetite, bloody stools.   Resp:   No chest wall deformity  Skin: no rash or lesions.  GU: no dysuria, change in color of urine, no urgency or frequency.  No flank pain, no hematuria   MS:  No joint pain or swelling.  No decreased range of motion.  No back pain.    Physical Exam  BP (!) 106/50 (BP Location: Left Arm, Patient Position: Sitting, Cuff Size: Normal)   Pulse 95   Temp (!) 97.5 F (36.4 C) (Oral)   Ht 5\' 2"  (1.575 m)   Wt 128 lb 6.4 oz (58.2 kg)   SpO2 96%   BMI 23.48 kg/m   GEN: A/Ox3; pleasant , NAD, thin   HEENT:  [place]/AT, NOSE-clear, THROAT-clear, no lesions, no postnasal drip or exudate noted.   NECK:  Supple w/ fair ROM; no JVD; normal carotid impulses w/o bruits; no thyromegaly or nodules palpated; no lymphadenopathy.    RESP  Clear  P & A; w/o, wheezes/ rales/ or rhonchi. no accessory muscle use, no dullness to percussion  CARD:  RRR, no m/r/g, no peripheral edema, pulses intact, no cyanosis or clubbing.  GI:   Soft & nt; nml bowel sounds; no organomegaly or masses detected.   Musco: Warm bil, no deformities or joint swelling noted.   Neuro: alert, no focal deficits noted.    Skin: Warm, no lesions or rashes    Lab Results:  CBC  BNP No results found for: "BNP"  ProBNP      Latest Ref Rng & Units 11/20/2020   12:36 PM  11/26/2016    9:25 AM  PFT Results  FVC-Pre L 1.91  1.79  P  FVC-Predicted Pre % 71  65  P  FVC-Post L  1.81  P  FVC-Predicted Post %  65  P  Pre FEV1/FVC % %  81  81  P  Post FEV1/FCV % %  82  P  FEV1-Pre L 1.55  1.46  P  FEV1-Predicted Pre % 80  73  P  FEV1-Post L  1.48  P  DLCO uncorrected ml/min/mmHg 11.83  12.16  P  DLCO UNC% % 60  53  P  DLCO corrected ml/min/mmHg 12.43  12.31  P  DLCO COR %Predicted % 63  53  P  DLVA Predicted % 99  98  P  TLC L  3.64  P  TLC % Predicted %  64  P  RV % Predicted %  86  P    P Preliminary result    No results found for: "NITRICOXIDE"      Assessment & Plan:    Assessment and Plan    Chronic Obstructive Pulmonary Disease (COPD) with emphysema COPD is well-managed with Bevespi twice daily and albuterol as needed.  Vaccinations are current. A follow-up pulmonary function test is planned to ensure stability at return office visit.. Continue Bevespi twice daily and use albuterol as needed. Encourage activity as tolerated  Schedule a follow-up pulmonary function test in six months.  Continue with high-protein diet.  Dry Cough   He experiences an intermittent nocturnal dry cough without sputum production, sinus drainage, or heartburn.  Recent chest x-ray January 2025 showed no acute process.  The differential includes post-nasal drip, GERD, or medication side effect, but no clear etiology is identified. Recommend Delsym cough syrup, 1-2 teaspoons at bedtime for symptomatic relief.  Rheumatoid Arthritis and shoulder pain Rheumatoid arthritis is managed with leflunomide and infliximab. He reports shoulder pain and hand numbness, possibly due to rotator cuff issues or bone-on-bone contact. Continue leflunomide and infliximab.  Keep follow-up with rheumatology follow up with a orthopedic specialist for shoulder and hand evaluation.      Plan  Patient Instructions  Delsym 2 tsp every 12hr as needed for cough.  Continue on BEVESPI 2 puffs  Twice daily Albuterol inhaler As needed   Activity as tolerated  Follow up with Dr. Marchelle Gearing or Yafet Cline NP  in 6 months and As needed  with PFT  Please contact office for sooner follow up if symptoms do not improve or worsen or seek emergency care     Rubye Oaks, NP 03/19/2024

## 2024-03-19 NOTE — Patient Instructions (Addendum)
 Delsym 2 tsp every 12hr as needed for cough.  Continue on BEVESPI 2 puffs Twice daily Albuterol inhaler As needed   Activity as tolerated  Follow up with Dr. Marchelle Gearing or Mikalyn Hermida NP  in 6 months and As needed  with PFT  Please contact office for sooner follow up if symptoms do not improve or worsen or seek emergency care

## 2024-03-22 ENCOUNTER — Inpatient Hospital Stay: Payer: Medicare HMO | Attending: Oncology

## 2024-03-22 ENCOUNTER — Other Ambulatory Visit: Payer: Self-pay | Admitting: *Deleted

## 2024-03-22 ENCOUNTER — Inpatient Hospital Stay

## 2024-03-22 ENCOUNTER — Inpatient Hospital Stay (HOSPITAL_BASED_OUTPATIENT_CLINIC_OR_DEPARTMENT_OTHER): Payer: Medicare HMO | Admitting: Nurse Practitioner

## 2024-03-22 ENCOUNTER — Encounter: Payer: Self-pay | Admitting: Nurse Practitioner

## 2024-03-22 VITALS — BP 102/45 | HR 73 | Temp 98.1°F | Resp 17 | Wt 128.5 lb

## 2024-03-22 DIAGNOSIS — D500 Iron deficiency anemia secondary to blood loss (chronic): Secondary | ICD-10-CM

## 2024-03-22 DIAGNOSIS — D509 Iron deficiency anemia, unspecified: Secondary | ICD-10-CM | POA: Insufficient documentation

## 2024-03-22 LAB — CBC WITH DIFFERENTIAL (CANCER CENTER ONLY)
Abs Immature Granulocytes: 0.02 10*3/uL (ref 0.00–0.07)
Basophils Absolute: 0 10*3/uL (ref 0.0–0.1)
Basophils Relative: 1 %
Eosinophils Absolute: 0.1 10*3/uL (ref 0.0–0.5)
Eosinophils Relative: 3 %
HCT: 21.2 % — ABNORMAL LOW (ref 39.0–52.0)
Hemoglobin: 6.3 g/dL — CL (ref 13.0–17.0)
Immature Granulocytes: 1 %
Lymphocytes Relative: 13 %
Lymphs Abs: 0.6 10*3/uL — ABNORMAL LOW (ref 0.7–4.0)
MCH: 23.3 pg — ABNORMAL LOW (ref 26.0–34.0)
MCHC: 29.7 g/dL — ABNORMAL LOW (ref 30.0–36.0)
MCV: 78.5 fL — ABNORMAL LOW (ref 80.0–100.0)
Monocytes Absolute: 0.7 10*3/uL (ref 0.1–1.0)
Monocytes Relative: 16 %
Neutro Abs: 3 10*3/uL (ref 1.7–7.7)
Neutrophils Relative %: 66 %
Platelet Count: 237 10*3/uL (ref 150–400)
RBC: 2.7 MIL/uL — ABNORMAL LOW (ref 4.22–5.81)
RDW: 17.2 % — ABNORMAL HIGH (ref 11.5–15.5)
WBC Count: 4.4 10*3/uL (ref 4.0–10.5)
nRBC: 0 % (ref 0.0–0.2)

## 2024-03-22 LAB — PREPARE RBC (CROSSMATCH)

## 2024-03-22 LAB — SAMPLE TO BLOOD BANK

## 2024-03-22 LAB — ABO/RH: ABO/RH(D): B NEG

## 2024-03-22 LAB — FERRITIN: Ferritin: 15 ng/mL — ABNORMAL LOW (ref 24–336)

## 2024-03-22 MED ORDER — SODIUM CHLORIDE 0.9% IV SOLUTION
250.0000 mL | INTRAVENOUS | Status: DC
  Administered 2024-03-22: 250 mL via INTRAVENOUS

## 2024-03-22 NOTE — Patient Instructions (Signed)

## 2024-03-22 NOTE — Progress Notes (Signed)
 Placed transfusion orders for 1 unit on 4/1 and blood ticket faxed to blood bank. DASH for stat P/U at 0730 tomorrow.

## 2024-03-22 NOTE — Progress Notes (Signed)
  [facility] OFFICE PROGRESS NOTE   Diagnosis: Iron deficiency  INTERVAL HISTORY:   Mr. Brightbill returns as scheduled.  He continues oral iron once daily.  He has constipation.  He is not aware of any bleeding.  No change in baseline black stools which he attributes to the iron.  No burgundy stools.  He has mild dyspnea on exertion.  No chest pain.  No fever.  He describes his energy as "pretty good".  Objective:  Vital signs in last 24 hours:  Blood pressure (!) 102/45, pulse 73, temperature 98.1 F (36.7 C), temperature source Temporal, resp. rate 17, weight 128 lb 8 oz (58.3 kg), SpO2 100%.    HEENT: Conjunctival pallor. Resp: Lungs clear bilaterally.  No respiratory distress. Cardio: Regular rate and rhythm. GI: No hepatosplenomegaly. Vascular: No leg edema. Neuro: Alert and oriented.    Lab Results:  Lab Results  Component Value Date   WBC 4.4 03/22/2024   HGB 6.3 (LL) 03/22/2024   HCT 21.2 (L) 03/22/2024   MCV 78.5 (L) 03/22/2024   PLT 237 03/22/2024   NEUTROABS 3.0 03/22/2024    Imaging:  No results found.  Medications: I have reviewed the patient's current medications.  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 Guilmette appears stable.  He has recurrent anemia with evidence of iron deficiency.  We discussed outpatient management with a unit of blood today, second unit and IV iron tomorrow versus hospital admission.  He would like to remain outpatient.  He appears stable for outpatient management.    We reviewed the risk of an allergic reaction and infection transmission with a blood transfusion.  He agrees to proceed  with the blood transfusion.  He has had IV iron in the past and understands the risk of a severe allergic reaction.  He would like to proceed with IV iron tomorrow.  He will receive Venofer 300 mg weekly x 3.  He will return for lab and  IV iron 03/30/2024.  Plan for lab, office visit, IV iron 04/06/2024.  We are available to see him sooner if needed.  He understands he should seek emergency evaluation with bleeding and/or signs/symptoms suggestive of progressive anemia.    Colbe Viviano ANP/GNP-BC   03/22/2024  10:58 AM

## 2024-03-22 NOTE — Progress Notes (Signed)
 CRITICAL VALUE STICKER  CRITICAL VALUE: Hgb 6.3  RECEIVER (on-site recipient of call):Lella Mullany,RN  DATE & TIME NOTIFIED: 03/22/24 @ 1027  MESSENGER (representative from lab): Marchelle Folks  MD NOTIFIED: Lonna Cobb, NP  TIME OF NOTIFICATION:03/22/24 @ 1030  RESPONSE:  Being evaluated by provider.

## 2024-03-23 ENCOUNTER — Inpatient Hospital Stay: Attending: Oncology

## 2024-03-23 VITALS — BP 129/60 | HR 62 | Temp 97.9°F | Resp 18

## 2024-03-23 DIAGNOSIS — D500 Iron deficiency anemia secondary to blood loss (chronic): Secondary | ICD-10-CM

## 2024-03-23 DIAGNOSIS — D509 Iron deficiency anemia, unspecified: Secondary | ICD-10-CM | POA: Insufficient documentation

## 2024-03-23 MED ORDER — SODIUM CHLORIDE 0.9 % IV SOLN
300.0000 mg | Freq: Once | INTRAVENOUS | Status: AC
  Administered 2024-03-23: 300 mg via INTRAVENOUS
  Filled 2024-03-23: qty 100

## 2024-03-23 MED ORDER — SODIUM CHLORIDE 0.9% FLUSH
10.0000 mL | Freq: Once | INTRAVENOUS | Status: DC | PRN
Start: 2024-03-23 — End: 2024-03-23

## 2024-03-23 MED ORDER — SODIUM CHLORIDE 0.9 % IV SOLN: INTRAVENOUS | Status: DC

## 2024-03-23 MED ORDER — HEPARIN SOD (PORK) LOCK FLUSH 100 UNIT/ML IV SOLN
500.0000 [IU] | Freq: Once | INTRAVENOUS | Status: DC | PRN
Start: 2024-03-23 — End: 2024-03-23

## 2024-03-23 MED ORDER — ALTEPLASE 2 MG IJ SOLR
2.0000 mg | Freq: Once | INTRAMUSCULAR | Status: DC | PRN
Start: 2024-03-23 — End: 2024-03-23

## 2024-03-23 MED ORDER — SODIUM CHLORIDE 0.9% IV SOLUTION
250.0000 mL | INTRAVENOUS | Status: DC
  Administered 2024-03-23: 250 mL via INTRAVENOUS

## 2024-03-23 MED ORDER — HEPARIN SOD (PORK) LOCK FLUSH 100 UNIT/ML IV SOLN
250.0000 [IU] | Freq: Once | INTRAVENOUS | Status: DC | PRN
Start: 2024-03-23 — End: 2024-03-23

## 2024-03-23 MED ORDER — SODIUM CHLORIDE 0.9% FLUSH
3.0000 mL | Freq: Once | INTRAVENOUS | Status: DC | PRN
Start: 2024-03-23 — End: 2024-03-23

## 2024-03-23 NOTE — Progress Notes (Signed)
 Patient presents today for 1 unit PRB and iron infusion of Venofer 300mg . Patient is in satisfactory condition with no new complaints voiced.  Vital signs are stable.  We will proceed with infusion per provider orders.    Patient tolerated treatment well with no complaints voiced.  Patient left ambulatory in stable condition.  Vital signs stable at discharge.  Follow up as scheduled.

## 2024-03-23 NOTE — Patient Instructions (Signed)
 [facility] A DEPT OF [facility]  Discharge Instructions: Thank you for choosing  Cancer Center to provide your oncology and hematology care.   If you have a lab appointment with the Cancer Center, please go directly to the Cancer Center and check in at the registration area.   Wear comfortable clothing and clothing appropriate for easy access to any Portacath or PICC line.   We strive to give you quality time with your provider. You may need to reschedule your appointment if you arrive late (15 or more minutes).  Arriving late affects you and other patients whose appointments are after yours.  Also, if you miss three or more appointments without notifying the office, you may be dismissed from the clinic at the provider's discretion.      For prescription refill requests, have your pharmacy contact our office and allow 72 hours for refills to be completed.    Today you received the following x1 unit of PRBC (blood) and Venofer (iron infusion).  Blood Transfusion, Adult A blood transfusion is a procedure in which you receive blood or a type of blood cell (blood component) through an IV. You may need a blood transfusion when you have a low blood count, which is a low number of any blood cell. This may result from a bleeding disorder, illness, injury, or surgery. The blood may come from a donor, or you may be able to have your own blood collected and stored (autologous blood donation) before a planned surgery. The blood given in a transfusion may be made up of different blood components. You may receive: Red blood cells. These carry oxygen to the cells in the body. Platelets. These help your blood to clot. Plasma. This is the liquid part of your blood. It carries proteins and other substances throughout the body. White blood cells. These help you fight infections. If you have hemophilia or another clotting disorder, you may also receive other types of  blood products. Depending on the type of blood product, this procedure may take 1-4 hours to complete. Tell a health care provider about: Any bleeding problems you have. Any previous reactions you have had during a blood transfusion. Any allergies you have. All medicines you are taking, including vitamins, herbs, eye drops, creams, and over-the-counter medicines. Any surgeries you have had. Any medical conditions you have. Whether you are pregnant or may be pregnant. What are the risks? Talk with your health care provider about risks. The most common problems include: A mild allergic reaction, such as red, swollen areas of skin (hives) and itching. Fever or chills. This may be the body's response to new blood cells received. This may occur during or up to 4 hours after the transfusion. More serious problems may include: A serious allergic reaction that causes difficulty breathing or swelling around the face and lips. Transfusion-associated circulatory overload (TACO), or too much fluid in the lungs. This may cause breathing problems. Transfusion-related acute lung injury (TRALI), which causes breathing difficulty and low oxygen in the blood. This can occur within hours of the transfusion or several days later. Iron overload. This can happen after receiving many blood transfusions over a period of time. Infection or virus being transmitted. This is rare because donated blood is carefully tested before it is given. Hemolytic transfusion reaction. This is rare. It happens when the body's defense system (immune system)tries to attack the new blood cells. Symptoms may include fever, chills, nausea, low blood pressure,  and low back or chest pain. Transfusion-associated graft-versus-host disease (TAGVHD). This is rare. It happens when donated cells attack the body's healthy tissues. What happens before the procedure? You will have a blood test to check your blood type. This test is done to know what  kind of blood your body will accept and to match it to the donor blood. If you are going to have a planned surgery, you may be able to do an autologous blood donation. This may be done in case you need to have a transfusion. You will have your temperature, blood pressure, and pulse checked before the transfusion. If you have had an allergic reaction to a transfusion in the past, you may be given medicine to help prevent a reaction. This medicine may be given to you by mouth (orally) or through an IV. What happens during the procedure?  An IV will be inserted into one of your veins. The bag of blood will be attached to your IV. The blood will then enter through your vein. Your temperature, blood pressure, and pulse will be monitored during the transfusion. This monitoring is done to detect early signs of a transfusion reaction. Tell your nurse right away if you have any of these symptoms during the transfusion: Shortness of breath or trouble breathing. Chest or back pain. Fever or chills. Itching or hives. If you have any signs or symptoms of a reaction, your transfusion will be stopped and you may be given medicine. When the transfusion is complete, your IV will be removed. Pressure may be applied to the IV site for a few minutes. A bandage (dressing)will be applied. The procedure may vary among health care providers and hospitals. What happens after the procedure? Your temperature, blood pressure, pulse, breathing rate, and blood oxygen level will be monitored until you leave the hospital or clinic. Your blood may be tested to see how you have responded to the transfusion. You may be warmed with fluids or blankets to maintain a normal body temperature. If you receive your blood transfusion in an outpatient setting, you will be told whom to contact to report any reactions. Where to find more information Visit the [employer] Red [employer]: redcross.org Summary A blood transfusion is a procedure  in which you receive blood or a type of blood cell (blood component) through an IV. The blood given in a transfusion may be made up of different blood components. You may receive red blood cells, platelets, plasma, or white blood cells depending on the condition treated. Your temperature, blood pressure, and pulse will be monitored before, during, and after the transfusion. After the transfusion, your blood may be tested to see how your body has responded. This information is not intended to replace advice given to you by your health care provider. Make sure you discuss any questions you have with your health care provider. Document Revised: 03/08/2022 Document Reviewed: 03/08/2022 Elsevier Patient Education  2024 Elsevier Inc.  Iron Sucrose Injection What is this medication? IRON SUCROSE (EYE ern SOO krose) treats low levels of iron (iron deficiency anemia) in people with kidney disease. Iron is a mineral that plays an important role in making red blood cells, which carry oxygen from your lungs to the rest of your body. This medicine may be used for other purposes; ask your health care provider or pharmacist if you have questions. COMMON BRAND NAME(S): Venofer What should I tell my care team before I take this medication? They need to know if you have any of  these conditions: Anemia not caused by low iron levels Heart disease High levels of iron in the blood Kidney disease Liver disease An unusual or allergic reaction to iron, other medications, foods, dyes, or preservatives Pregnant or trying to get pregnant Breastfeeding How should I use this medication? This medication is infused into a vein. It is given by your care team in a hospital or clinic setting. Talk to your care team about the use of this medication in children. While it may be prescribed for children as young as 2 years for selected conditions, precautions do apply. Overdosage: If you think you have taken too much of this  medicine contact a poison control center or emergency room at once. NOTE: This medicine is only for you. Do not share this medicine with others. What if I miss a dose? Keep appointments for follow-up doses. It is important not to miss your dose. Call your care team if you are unable to keep an appointment. What may interact with this medication? Do not take this medication with any of the following: Deferoxamine Dimercaprol Other iron products This medication may also interact with the following: Chloramphenicol Deferasirox This list may not describe all possible interactions. Give your health care provider a list of all the medicines, herbs, non-prescription drugs, or dietary supplements you use. Also tell them if you smoke, drink alcohol, or use illegal drugs. Some items may interact with your medicine. What should I watch for while using this medication? Visit your care team for regular checks on your progress. Tell your care team if your symptoms do not start to get better or if they get worse. You may need blood work done while you are taking this medication. You may need to eat more foods that contain iron. Talk to your care team. Foods that contain iron include whole grains or cereals, dried fruits, beans, peas, leafy green vegetables, and organ meats (liver, kidney). What side effects may I notice from receiving this medication? Side effects that you should report to your care team as soon as possible: Allergic reactions--skin rash, itching, hives, swelling of the face, lips, tongue, or throat Low blood pressure--dizziness, feeling faint or lightheaded, blurry vision Shortness of breath Side effects that usually do not require medical attention (report to your care team if they continue or are bothersome): Flushing Headache Joint pain Muscle pain Nausea Pain, redness, or irritation at injection site This list may not describe all possible side effects. Call your doctor for  medical advice about side effects. You may report side effects to FDA at 1-800-FDA-1088. Where should I keep my medication? This medication is given in a hospital or clinic. It will not be stored at home. NOTE: This sheet is a summary. It may not cover all possible information. If you have questions about this medicine, talk to your doctor, pharmacist, or health care provider.  2024 Elsevier/Gold Standard (2023-07-30 00:00:00)  To help prevent nausea and vomiting after your treatment, we encourage you to take your nausea medication as directed.  BELOW ARE SYMPTOMS THAT SHOULD BE REPORTED IMMEDIATELY: *FEVER GREATER THAN 100.4 F (38 C) OR HIGHER *CHILLS OR SWEATING *NAUSEA AND VOMITING THAT IS NOT CONTROLLED WITH YOUR NAUSEA MEDICATION *UNUSUAL SHORTNESS OF BREATH *UNUSUAL BRUISING OR BLEEDING *URINARY PROBLEMS (pain or burning when urinating, or frequent urination) *BOWEL PROBLEMS (unusual diarrhea, constipation, pain near the anus) TENDERNESS IN MOUTH AND THROAT WITH OR WITHOUT PRESENCE OF ULCERS (sore throat, sores in mouth, or a toothache) UNUSUAL RASH, SWELLING OR PAIN  UNUSUAL VAGINAL DISCHARGE OR ITCHING   Items with * indicate a potential emergency and should be followed up as soon as possible or go to the Emergency Department if any problems should occur.  Please show the CHEMOTHERAPY ALERT CARD or IMMUNOTHERAPY ALERT CARD at check-in to the Emergency Department and triage nurse.  Should you have questions after your visit or need to cancel or reschedule your appointment, please contact [facility]  Dept: [phone]  and follow the prompts.  Office hours are 8:00 a.m. to 4:30 p.m. Monday - Friday. Please note that voicemails left after 4:00 p.m. may not be returned until the following business day.  We are closed weekends and major holidays. You have access to a nurse at all times for urgent questions. Please call the main  number to the clinic Dept: [phone] and follow the prompts.   For any non-urgent questions, you may also contact your provider using MyChart. We now offer e-Visits for anyone 84 and older to request care online for non-urgent symptoms. For details visit [email or web address].   Also download the MyChart app! Go to the app store, search "MyChart", open the app, select [place], and log in with your MyChart username and password.

## 2024-03-24 LAB — TYPE AND SCREEN
ABO/RH(D): B NEG
Antibody Screen: NEGATIVE
Unit division: 0
Unit division: 0

## 2024-03-24 LAB — BPAM RBC
Blood Product Expiration Date: 202505052359
Blood Product Expiration Date: 202505012359
ISSUE DATE / TIME: 202503311303
ISSUE DATE / TIME: 202504010735
Unit Type and Rh: 1700
Unit Type and Rh: 1700
Unit Type and Rh: 202505052359

## 2024-03-25 ENCOUNTER — Ambulatory Visit: Admitting: Physical Medicine and Rehabilitation

## 2024-03-25 ENCOUNTER — Other Ambulatory Visit: Payer: Self-pay

## 2024-03-25 VITALS — BP 135/78 | HR 65

## 2024-03-25 DIAGNOSIS — M5412 Radiculopathy, cervical region: Secondary | ICD-10-CM

## 2024-03-25 MED ORDER — METHYLPREDNISOLONE ACETATE 40 MG/ML IJ SUSP
40.0000 mg | Freq: Once | INTRAMUSCULAR | Status: AC
  Administered 2024-03-25: 40 mg

## 2024-03-25 NOTE — Progress Notes (Signed)
 Tony Mcmillan - 83 y.o. male MRN [id number]  Date of birth: 09-05-1941  Office Visit Note: Visit Date: 03/25/2024 PCP: Tollie Eth, NP Referred by: Tollie Eth, NP  Subjective: Chief Complaint  Patient presents with   Neck - Pain   HPI:  Tony Mcmillan is a 83 y.o. male who comes in today at the request of Karenann Cai, PA-C for planned Left C7-T1 Cervical Interlaminar epidural steroid injection with fluoroscopic guidance.  The patient has failed conservative care including home exercise, medications, time and activity modification.  This injection will be diagnostic and hopefully therapeutic.  Please see requesting physician notes for further details and justification.  I have seen the patient in the past for similar injection.  We did this in August 2024.  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  After we did the injection he did not get much relief at the time and so he went on to see Dr. Ophelia Charter and have carpal tunnel release and ulnar transposition.  He reports those did not seem to help either in fact made the symptoms somewhat worse.  He does have prior anterior and posterior cervical fusion from C2-C6 which was done in [place] many years ago.  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  If he is not getting relief from this or the ulnar surgeries he likely has severe nerve injury from the peripheral nerve compression but also likely may be to getting symptoms from the cord itself.  If he does not get more than 60% relief I would not repeat the injection.   ROS Otherwise per HPI.  Assessment & Plan: Visit Diagnoses:    ICD-10-CM   1. Radiculopathy, cervical region  M54.12 XR C-ARM NO REPORT    Epidural Steroid injection    methylPREDNISolone acetate (DEPO-MEDROL) injection 40 mg      Plan: No additional findings.   Meds & Orders:  Meds ordered this encounter   Medications   methylPREDNISolone acetate (DEPO-MEDROL) injection 40 mg    Orders Placed This Encounter  Procedures   XR C-ARM NO REPORT   Epidural Steroid injection    Follow-up: Return for visit to requesting provider as needed.   Procedures: No procedures performed  Cervical Epidural Steroid Injection - Interlaminar Approach with Fluoroscopic Guidance  Patient: Tony Mcmillan      Date of Birth: 06-Mar-1941 MRN: [id number] PCP: Tollie Eth, NP      Visit Date: 03/25/2024   Universal Protocol:    Date/Time: 04/03/251:28 PM  Consent Given By: the patient  Position: PRONE  Additional Comments: Vital signs were monitored before and after the procedure. Patient was prepped and draped in the usual sterile fashion. The correct patient, procedure, and site was verified.   Injection Procedure Details:   Procedure diagnoses: Radiculopathy, cervical region [M54.12]    Meds Administered:  Meds ordered this encounter  Medications   methylPREDNISolone acetate (DEPO-MEDROL) injection 40 mg     Laterality: Left  Location/Site: C7-T1  Needle: 3.5 in., 20 ga. Tuohy  Needle Placement: Paramedian epidural space  Findings:  -Comments: Excellent flow of contrast into the epidural space.  The patient is somewhat difficult to position adequately with his left shoulder restricted movement and pain.  Also imaging is limited with his prior cervical posterior and anterior fusion.  Nonetheless we were able to get pretty nice fluoroscopic imaging and safe injection into the  epidural space here.  He was very sensitive once we got essentially right at the ligamentum flavum.  He did well otherwise and the injectate was delivered really in good position.  Procedure Details: Using a paramedian approach from the side mentioned above, the region overlying the inferior lamina was localized under fluoroscopic visualization and the soft tissues overlying this structure were infiltrated with 4 ml. of  1% Lidocaine without Epinephrine. A # 20 gauge, Tuohy needle was inserted into the epidural space using a paramedian approach.  The epidural space was localized using loss of resistance along with contralateral oblique bi-planar fluoroscopic views.  After negative aspirate for air, blood, and CSF, a 2 ml. volume of Isovue-250 was injected into the epidural space and the flow of contrast was observed. Radiographs were obtained for documentation purposes.   The injectate was administered into the level noted above.  Additional Comments:  No complications occurred Dressing: 2 x 2 sterile gauze and Band-Aid    Post-procedure details: Patient was observed during the procedure. Post-procedure instructions were reviewed.  Patient left the clinic in stable condition.   Clinical History: Narrative & Impression [facility]   NEUROIMAGING REPORT     STUDY DATE: 07/01/23  MR CERVICAL SPINE WO CONTRAST     COMPARISON: 03/22/23 CT    FINDINGS:    On sagittal views the vertebral bodies have normal height and alignment.  Anterior cervical discectomy and fusion with metal hardware (anteriorly and posteriorly) spanning C2-C6 levels with interbody fusion device spanning C3-C5 vertebral bodies. 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  The posterior fossa, pituitary gland and paraspinal soft tissues are unremarkable.    On axial views: C2-3 no spinal stenosis or foraminal narrowing C3-4 no spinal stenosis or foraminal narrowing C4-5 no spinal stenosis or foraminal narrowing  C5-6 no spinal stenosis or foraminal narrowing C6-7 no spinal stenosis or foraminal narrowing C7-T1 disc bulging and uncovertebral joint hypertrophy with severe bilateral foraminal stenosis T1-2 disc bulging and facet hypertrophy with mild bilateral foraminal stenosis T2-3 no spinal stenosis or foraminal narrowing   Limited views of the soft tissues of the head and neck  are unremarkable.     IMPRESSION:    MRI cervical spine (without) demonstrating: - Cervical discectomy and fusion spanning C2-C6 with anterior and posterior hardware and interbody fusion device from C3-C5 levels. - At C7-T1 disc bulging and uncovertebral joint hypertrophy with severe bilateral foraminal stenosis. - At T1-2 disc bulging and facet hypertrophy with mild bilateral foraminal stenosis.       INTERPRETING PHYSICIAN:  Suanne Marker, MD Certified in Neurology, Neurophysiology and Neuroimaging   [facility] [address] [place], [place] [address] [phone]  ----- EMG/NCS 2024 Conclusion:  1. 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  2. 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 3.  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     Objective:  VS:  HT:    WT:   BMI:     BP:135/78  HR:65bpm  TEMP: ( )  RESP:  Physical Exam Vitals and nursing note reviewed.  Constitutional:      General: He is not in acute distress.    Appearance: Normal appearance. He is well-developed. He is not ill-appearing.  HENT:     Head: Normocephalic and atraumatic.     Right Ear: External ear normal.     Left Ear: External ear normal.  Eyes:     Extraocular Movements: Extraocular movements intact.     Conjunctiva/sclera: Conjunctivae normal.     Pupils: Pupils are equal, round, and reactive to light.  Cardiovascular:     Rate and Rhythm: Normal rate.      Pulses: Normal pulses.     Heart sounds: Normal heart sounds.  Pulmonary:     Effort: Pulmonary effort is normal. No respiratory distress.  Abdominal:     General: There is no distension.     Palpations: Abdomen is soft.  Musculoskeletal:        General: No signs of injury.     Cervical back: Normal range of motion and neck supple. Tenderness present. No rigidity.     Right lower leg: No edema.     Left lower leg: No edema.     Comments: Patient has good strength in the upper extremities with 5 out of 5 strength in wrist extension long finger flexion APB.  No intrinsic hand muscle atrophy.  Negative Hoffmann's test.  Lymphadenopathy:     Cervical: No cervical adenopathy.  Skin:    General: Skin is warm and dry.     Findings: No erythema or rash.  Neurological:     General: No focal deficit present.     Mental Status: He is alert and oriented to person, place, and time.     Cranial Nerves: No cranial nerve deficit.     Sensory: Sensory deficit present.     Motor: Weakness present. No abnormal muscle tone.     Coordination: Coordination normal.     Gait: Gait abnormal.  Psychiatric:        Mood and Affect: Mood normal.        Behavior: Behavior normal.      Imaging: No results found.

## 2024-03-25 NOTE — Procedures (Signed)
 Cervical Epidural Steroid Injection - Interlaminar Approach with Fluoroscopic Guidance  Patient: Tony Mcmillan      Date of Birth: [birth year] MRN: [id number] PCP: Tollie Eth, NP      Visit Date: 03/25/2024   Universal Protocol:    Date/Time: 04/03/251:28 PM  Consent Given By: the patient  Position: PRONE  Additional Comments: Vital signs were monitored before and after the procedure. Patient was prepped and draped in the usual sterile fashion. The correct patient, procedure, and site was verified.   Injection Procedure Details:   Procedure diagnoses: Radiculopathy, cervical region [M54.12]    Meds Administered:  Meds ordered this encounter  Medications   methylPREDNISolone acetate (DEPO-MEDROL) injection 40 mg     Laterality: Left  Location/Site: C7-T1  Needle: 3.5 in., 20 ga. Tuohy  Needle Placement: Paramedian epidural space  Findings:  -Comments: Excellent flow of contrast into the epidural space.  The patient is somewhat difficult to position adequately with his left shoulder restricted movement and pain.  Also imaging is limited with his prior cervical posterior and anterior fusion.  Nonetheless we were able to get pretty nice fluoroscopic imaging and safe injection into the epidural space here.  He was very sensitive once we got essentially right at the ligamentum flavum.  He did well otherwise and the injectate was delivered really in good position.  Procedure Details: Using a paramedian approach from the side mentioned above, the region overlying the inferior lamina was localized under fluoroscopic visualization and the soft tissues overlying this structure were infiltrated with 4 ml. of 1% Lidocaine without Epinephrine. A # 20 gauge, Tuohy needle was inserted into the epidural space using a paramedian approach.  The epidural space was localized using loss of resistance along with contralateral oblique bi-planar fluoroscopic views.  After negative aspirate for air,  blood, and CSF, a 2 ml. volume of Isovue-250 was injected into the epidural space and the flow of contrast was observed. Radiographs were obtained for documentation purposes.   The injectate was administered into the level noted above.  Additional Comments:  No complications occurred Dressing: 2 x 2 sterile gauze and Band-Aid    Post-procedure details: Patient was observed during the procedure. Post-procedure instructions were reviewed.  Patient left the clinic in stable condition.

## 2024-03-25 NOTE — Progress Notes (Signed)
 Pain Scale   Average Pain 7        +Driver, -BT, -Dye Allergies.

## 2024-03-25 NOTE — Patient Instructions (Signed)

## 2024-03-30 ENCOUNTER — Inpatient Hospital Stay (HOSPITAL_BASED_OUTPATIENT_CLINIC_OR_DEPARTMENT_OTHER): Admitting: Nurse Practitioner

## 2024-03-30 ENCOUNTER — Inpatient Hospital Stay

## 2024-03-30 ENCOUNTER — Encounter: Payer: Self-pay | Admitting: Nurse Practitioner

## 2024-03-30 VITALS — BP 102/61 | HR 80 | Temp 97.9°F | Resp 18 | Ht 62.0 in | Wt 128.8 lb

## 2024-03-30 DIAGNOSIS — D500 Iron deficiency anemia secondary to blood loss (chronic): Secondary | ICD-10-CM | POA: Diagnosis not present

## 2024-03-30 DIAGNOSIS — D509 Iron deficiency anemia, unspecified: Secondary | ICD-10-CM | POA: Diagnosis not present

## 2024-03-30 LAB — CBC WITH DIFFERENTIAL (CANCER CENTER ONLY)
Abs Immature Granulocytes: 0.01 10*3/uL (ref 0.00–0.07)
Basophils Absolute: 0.1 10*3/uL (ref 0.0–0.1)
Basophils Relative: 1 %
Eosinophils Absolute: 0.1 10*3/uL (ref 0.0–0.5)
Eosinophils Relative: 1 %
HCT: 36 % — ABNORMAL LOW (ref 39.0–52.0)
Hemoglobin: 11 g/dL — ABNORMAL LOW (ref 13.0–17.0)
Immature Granulocytes: 0 %
Lymphocytes Relative: 10 %
Lymphs Abs: 0.7 10*3/uL (ref 0.7–4.0)
MCH: 25.9 pg — ABNORMAL LOW (ref 26.0–34.0)
MCHC: 30.6 g/dL (ref 30.0–36.0)
MCV: 84.7 fL (ref 80.0–100.0)
Monocytes Absolute: 0.7 10*3/uL (ref 0.1–1.0)
Monocytes Relative: 9 %
Neutro Abs: 5.9 10*3/uL (ref 1.7–7.7)
Neutrophils Relative %: 79 %
Platelet Count: 365 10*3/uL (ref 150–400)
RBC: 4.25 MIL/uL (ref 4.22–5.81)
RDW: 21.2 % — ABNORMAL HIGH (ref 11.5–15.5)
WBC Count: 7.4 10*3/uL (ref 4.0–10.5)
nRBC: 0 % (ref 0.0–0.2)

## 2024-03-30 LAB — SAMPLE TO BLOOD BANK

## 2024-03-30 NOTE — Progress Notes (Signed)
  [facility] OFFICE PROGRESS NOTE   Diagnosis: Iron deficiency  INTERVAL HISTORY:   Tony Mcmillan returns as scheduled.  At time of his last visit 03/22/2024 he was noted to have recurrent anemia and evidence of iron deficiency.  He was transfused 2 units of blood and began Venofer 300 mg weekly x 3.  Overall he is feeling better.  He has noted improvement in his energy level.  He denies shortness of breath.  He intermittently has black stools.  He attributes this to oral iron.  Objective:  Vital signs in last 24 hours:  Blood pressure 102/61, pulse 80, temperature 97.9 F (36.6 C), temperature source Temporal, resp. rate 18, height 5\' 2"  (1.575 m), weight 128 lb 12.8 oz (58.4 kg), SpO2 96%.     Resp: Lungs clear bilaterally. Cardio: Regular rate and rhythm. GI: Abdomen soft and nontender. Vascular: No leg edema.   Lab Results:  Lab Results  Component Value Date   WBC 7.4 03/30/2024   HGB 11.0 (L) 03/30/2024   HCT 36.0 (L) 03/30/2024   MCV 84.7 03/30/2024   PLT 365 03/30/2024   NEUTROABS 5.9 03/30/2024    Imaging:  No results found.  Medications: I have reviewed the patient's current medications.  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  When he was here 03/22/2024 he had significant anemia, indices consistent with recurrent iron deficiency, ferritin low at 18.  He was transfused 2 units of blood and began a course of IV Venofer.  CBC from today shows marked improvement in the hemoglobin.  He was scheduled to receive the second weekly dose of IV Venofer today.  IV access  could not be obtained.  He will be rescheduled for the second dose of IV iron later this week.  He will return for lab and follow-up in 4 to 5 weeks.  We are available to see him sooner if needed.    Tony Mcmillan ANP/GNP-BC   03/30/2024  1:32 PM

## 2024-03-31 ENCOUNTER — Telehealth: Payer: Self-pay | Admitting: Nurse Practitioner

## 2024-03-31 ENCOUNTER — Inpatient Hospital Stay

## 2024-03-31 ENCOUNTER — Ambulatory Visit

## 2024-03-31 VITALS — BP 137/65 | HR 64 | Temp 97.5°F | Resp 18

## 2024-03-31 DIAGNOSIS — D509 Iron deficiency anemia, unspecified: Secondary | ICD-10-CM | POA: Diagnosis not present

## 2024-03-31 DIAGNOSIS — D500 Iron deficiency anemia secondary to blood loss (chronic): Secondary | ICD-10-CM

## 2024-03-31 MED ORDER — SODIUM CHLORIDE 0.9 % IV SOLN: INTRAVENOUS | Status: DC

## 2024-03-31 MED ORDER — IRON SUCROSE 20 MG/ML IV SOLN
300.0000 mg | Freq: Once | INTRAVENOUS | Status: AC
  Administered 2024-03-31: 300 mg via INTRAVENOUS
  Filled 2024-03-31: qty 200

## 2024-03-31 NOTE — Telephone Encounter (Signed)
 Patient has been scheduled for follow-up visit per 03/30/24 LOS.  Pt aware of scheduled appt details.

## 2024-04-06 ENCOUNTER — Inpatient Hospital Stay

## 2024-04-06 VITALS — BP 140/69 | HR 75 | Temp 97.1°F | Resp 18 | Wt 131.9 lb

## 2024-04-06 DIAGNOSIS — D509 Iron deficiency anemia, unspecified: Secondary | ICD-10-CM | POA: Diagnosis not present

## 2024-04-06 DIAGNOSIS — D500 Iron deficiency anemia secondary to blood loss (chronic): Secondary | ICD-10-CM

## 2024-04-06 LAB — CBC WITH DIFFERENTIAL (CANCER CENTER ONLY)
Abs Immature Granulocytes: 0.02 10*3/uL (ref 0.00–0.07)
Basophils Absolute: 0 10*3/uL (ref 0.0–0.1)
Basophils Relative: 0 %
Eosinophils Absolute: 0.1 10*3/uL (ref 0.0–0.5)
Eosinophils Relative: 1 %
HCT: 36.2 % — ABNORMAL LOW (ref 39.0–52.0)
Hemoglobin: 11.3 g/dL — ABNORMAL LOW (ref 13.0–17.0)
Immature Granulocytes: 0 %
Lymphocytes Relative: 10 %
Lymphs Abs: 0.8 10*3/uL (ref 0.7–4.0)
MCH: 26.7 pg (ref 26.0–34.0)
MCHC: 31.2 g/dL (ref 30.0–36.0)
MCV: 85.4 fL (ref 80.0–100.0)
Monocytes Absolute: 0.8 10*3/uL (ref 0.1–1.0)
Monocytes Relative: 10 %
Neutro Abs: 6.4 10*3/uL (ref 1.7–7.7)
Neutrophils Relative %: 79 %
Platelet Count: 425 10*3/uL — ABNORMAL HIGH (ref 150–400)
RBC: 4.24 MIL/uL (ref 4.22–5.81)
RDW: 21.8 % — ABNORMAL HIGH (ref 11.5–15.5)
WBC Count: 8 10*3/uL (ref 4.0–10.5)
nRBC: 0 % (ref 0.0–0.2)

## 2024-04-06 LAB — FERRITIN: Ferritin: 197 ng/mL (ref 24–336)

## 2024-04-06 LAB — SAMPLE TO BLOOD BANK

## 2024-04-06 MED ORDER — IRON SUCROSE 20 MG/ML IV SOLN
300.0000 mg | Freq: Once | INTRAVENOUS | Status: AC
  Administered 2024-04-06: 300 mg via INTRAVENOUS
  Filled 2024-04-06: qty 10

## 2024-04-06 MED ORDER — SODIUM CHLORIDE 0.9 % IV SOLN: INTRAVENOUS | Status: DC

## 2024-04-06 NOTE — Progress Notes (Signed)
 The patient did not experience any adverse reactions to the intravenous iron infusion. The IV site is clean and intact, with no signs of bruising. The patient was stable at the time of discharge.

## 2024-04-06 NOTE — Patient Instructions (Signed)

## 2024-04-07 ENCOUNTER — Ambulatory Visit: Payer: Self-pay

## 2024-04-07 NOTE — Telephone Encounter (Signed)
 He is under the care of a hematologist.  The hematologist is familiar with his care and suspected etiology of his anemia.  His question is best either addressed by his PCP (who is familiar with his history), or with the hematologist who has been treating him, rather than by me.  This is not an acute issue.

## 2024-04-07 NOTE — Telephone Encounter (Signed)
 Pt states that he goes to [facility] occasionally and he was been getting transfusion. Pt states that he doesn't understand why he is getting these transfusions. Pt states that he is worried that he is losing blood somewhere. Pt states that he has been getting these treatments regularly. Denies SOB, pallor, weakness, abd pain, blood in stool. Pt states that his stool has been dark for months. Pt takes iron. Pt states that the stools have had no change recently.  Chief Complaint: "wondering if I am losing blood somewhere, why do I have to keep getting these transfusions" Symptoms: denies Pertinent Negatives: Patient denies fever, SOB, difficulty breathing, weakness, blood in stool, pallor Disposition: [] ED /[] Urgent Care (no appt availability in office) / [x] Appointment(In office/virtual)/ []  [facility]/ [] Home Care/ [] Refused Recommended Disposition /[] [facility]/ []  Follow-up with PCP Additional Notes: Pt states that he would like to know why he has to keep getting these transfusions. Pt was advised that he has iron deficiency anemia. Pt states that he knows this, but still asks why he needs to keep having these transfusion. Pt states that he has been having dark stools for months. Pt confirms that he takes iron as well.  Pt scheduled. Pt also advised to f/u with his NP LT that he sees through [facility].  Copied from CRM [id number]. Topic: Clinical - Medical Advice >> Apr 07, 2024  9:12 AM Rosamond Comes wrote: Reason for CRM: patient calling in had infusion 04/06/24 Patient would like to know why he is losing blood, what is going on.    Patient phone [phone] ok to leave detailed message. Reason for Disposition  [1] Suspected food (or medicine) is eliminated AND [2] abnormal color persists > 48 hours  Answer Assessment - Initial Assessment Questions 1. COLOR: "What color is it?" "Is that color in part or all of the stool?"     dark 2. ONSET: "When was the unusual color first  noted?"     A long time, months 3. CAUSE: "Have you eaten any food or taken any medicine of this color?" Note: See listing in Background Information section.      Takes iron regulary 4. OTHER SYMPTOMS: "Do you have any other symptoms?" (e.g., abdomen pain, diarrhea, jaundice, fever).     Denies abd pain, denies diarrhea, denies pallor,  denies worsening SOB, denies N/V  Protocols used: Stools - Unusual Color-A-AH

## 2024-04-12 ENCOUNTER — Ambulatory Visit: Payer: Self-pay | Admitting: Family Medicine

## 2024-04-28 ENCOUNTER — Inpatient Hospital Stay: Attending: Oncology | Admitting: Nurse Practitioner

## 2024-04-28 ENCOUNTER — Inpatient Hospital Stay: Attending: Oncology

## 2024-04-28 ENCOUNTER — Encounter: Payer: Self-pay | Admitting: Nurse Practitioner

## 2024-04-28 VITALS — BP 120/60 | HR 89 | Temp 98.2°F | Resp 18 | Ht 62.0 in | Wt 129.6 lb

## 2024-04-28 DIAGNOSIS — Z87891 Personal history of nicotine dependence: Secondary | ICD-10-CM | POA: Diagnosis not present

## 2024-04-28 DIAGNOSIS — M069 Rheumatoid arthritis, unspecified: Secondary | ICD-10-CM | POA: Diagnosis not present

## 2024-04-28 DIAGNOSIS — D509 Iron deficiency anemia, unspecified: Secondary | ICD-10-CM | POA: Diagnosis present

## 2024-04-28 DIAGNOSIS — D500 Iron deficiency anemia secondary to blood loss (chronic): Secondary | ICD-10-CM

## 2024-04-28 DIAGNOSIS — J449 Chronic obstructive pulmonary disease, unspecified: Secondary | ICD-10-CM | POA: Diagnosis not present

## 2024-04-28 DIAGNOSIS — D508 Other iron deficiency anemias: Secondary | ICD-10-CM

## 2024-04-28 DIAGNOSIS — I2510 Atherosclerotic heart disease of native coronary artery without angina pectoris: Secondary | ICD-10-CM | POA: Diagnosis not present

## 2024-04-28 LAB — CBC WITH DIFFERENTIAL (CANCER CENTER ONLY)
Abs Immature Granulocytes: 0.01 10*3/uL (ref 0.00–0.07)
Basophils Absolute: 0 10*3/uL (ref 0.0–0.1)
Basophils Relative: 1 %
Eosinophils Absolute: 0.1 10*3/uL (ref 0.0–0.5)
Eosinophils Relative: 2 %
HCT: 32.8 % — ABNORMAL LOW (ref 39.0–52.0)
Hemoglobin: 10.4 g/dL — ABNORMAL LOW (ref 13.0–17.0)
Immature Granulocytes: 0 %
Lymphocytes Relative: 12 %
Lymphs Abs: 0.6 10*3/uL — ABNORMAL LOW (ref 0.7–4.0)
MCH: 28.3 pg (ref 26.0–34.0)
MCHC: 31.7 g/dL (ref 30.0–36.0)
MCV: 89.1 fL (ref 80.0–100.0)
Monocytes Absolute: 0.5 10*3/uL (ref 0.1–1.0)
Monocytes Relative: 10 %
Neutro Abs: 4.1 10*3/uL (ref 1.7–7.7)
Neutrophils Relative %: 75 %
Platelet Count: 235 10*3/uL (ref 150–400)
RBC: 3.68 MIL/uL — ABNORMAL LOW (ref 4.22–5.81)
RDW: 19.9 % — ABNORMAL HIGH (ref 11.5–15.5)
WBC Count: 5.4 10*3/uL (ref 4.0–10.5)
nRBC: 0 % (ref 0.0–0.2)

## 2024-04-28 LAB — SAMPLE TO BLOOD BANK

## 2024-04-28 LAB — FERRITIN: Ferritin: 92 ng/mL (ref 24–336)

## 2024-04-28 NOTE — Progress Notes (Signed)
  [facility] OFFICE PROGRESS NOTE   Diagnosis: Iron  deficiency  INTERVAL HISTORY:   Mr. Simbeck returns as scheduled.  He completed Venofer  300 mg 03/23/2024, 03/31/2024, 04/06/2024.  He denies signs of allergic reaction, feels he tolerated well.  He reports a good energy level.  No shortness of breath.  Stools are "a little bit" black.  He takes oral iron .  He denies abdominal pain.  No nausea or vomiting.  He is not aware of any bleeding.  Objective:  Vital signs in last 24 hours:  Blood pressure 120/60, pulse 89, temperature 98.2 F (36.8 C), temperature source Temporal, resp. rate 18, height 5\' 2"  (1.575 m), weight 129 lb 9.6 oz (58.8 kg), SpO2 98%.    Resp: Lungs clear bilaterally. Cardio: Regular rate and rhythm. GI: No hepatosplenomegaly. Vascular: No leg edema.   Lab Results:  Lab Results  Component Value Date   WBC 5.4 04/28/2024   HGB 10.4 (L) 04/28/2024   HCT 32.8 (L) 04/28/2024   MCV 89.1 04/28/2024   PLT 235 04/28/2024   NEUTROABS 4.1 04/28/2024    Imaging:  No results found.  Medications: I have reviewed the patient's current medications.  Assessment/Plan: Recurrent iron  deficiency anemia likely due to AVMs Negative stool Hemoccult cards June 2023 Urine negative for blood 05/27/2022 IV Venofer  06/12/2023, 06/19/2023, 06/27/2023 03/22/2024 hemoglobin 6.3, MCV 78 Transfuse 1 unit of blood 03/22/2024 and 1 unit on 03/23/2024 IV Venofer  03/23/2024, 03/31/2024, 04/06/2024   COPD Rheumatoid arthritis CAD  Disposition: Mr. Prideaux has a history of iron  deficiency anemia presumed to be due to AVMs.  He presented for routine follow-up on 03/22/2024 and was found to have severe anemia, recurrent iron  deficiency.  He received 2 units of blood and has completed a course of IV Venofer .  Hemoglobin has partially corrected, ferritin is in normal range.  We discussed that the recurrent iron  deficiency anemia is likely due to AVMs.  He requests follow-up with GI.  We  placed a referral to Dr. Sandrea Cruel.  He will return in 6 weeks for a follow-up CBC and ferritin.  Office visit in 3 months.  We are available to see him sooner if needed.  We specifically discussed signs of progressive anemia.    Elius Chipley ANP/GNP-BC   04/28/2024  2:39 PM

## 2024-04-29 ENCOUNTER — Telehealth: Payer: Self-pay | Admitting: Oncology

## 2024-04-29 NOTE — Telephone Encounter (Signed)
 CALLED PATIENT AND LEFT MESSAGE ABOUT UPCOMING APPOINTMENT. LET PATIENT KNOW TO CALL BACK IF DATE AND TIME DOESN'T WORK.

## 2024-05-11 ENCOUNTER — Telehealth: Payer: Self-pay

## 2024-05-13 ENCOUNTER — Encounter: Payer: Self-pay | Admitting: Physician Assistant

## 2024-05-13 ENCOUNTER — Encounter: Payer: Self-pay | Admitting: Gastroenterology

## 2024-05-13 ENCOUNTER — Ambulatory Visit: Admitting: Physician Assistant

## 2024-05-13 VITALS — BP 122/62 | HR 89 | Ht 62.0 in | Wt 131.0 lb

## 2024-05-13 DIAGNOSIS — K269 Duodenal ulcer, unspecified as acute or chronic, without hemorrhage or perforation: Secondary | ICD-10-CM

## 2024-05-13 DIAGNOSIS — K5520 Angiodysplasia of colon without hemorrhage: Secondary | ICD-10-CM | POA: Diagnosis not present

## 2024-05-13 DIAGNOSIS — R195 Other fecal abnormalities: Secondary | ICD-10-CM | POA: Diagnosis not present

## 2024-05-13 DIAGNOSIS — D509 Iron deficiency anemia, unspecified: Secondary | ICD-10-CM | POA: Diagnosis not present

## 2024-05-13 DIAGNOSIS — K219 Gastro-esophageal reflux disease without esophagitis: Secondary | ICD-10-CM | POA: Diagnosis not present

## 2024-05-13 MED ORDER — PANTOPRAZOLE SODIUM 40 MG PO TBEC: 40.0000 mg | DELAYED_RELEASE_TABLET | Freq: Every day | ORAL | 3 refills | Status: AC

## 2024-05-13 MED ORDER — NA SULFATE-K SULFATE-MG SULF 17.5-3.13-1.6 GM/177ML PO SOLN: ORAL | 0 refills | Status: DC

## 2024-05-13 NOTE — Progress Notes (Signed)
 05/14/2024 Tony Mcmillan [id number] 1941/03/12  Referring provider: Annella Kief, NP Primary GI doctor: Dr. Karene Oto ( Dr. 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 Sandrea Cruel showed class B esophagitis, gastritis, small hiatal hernia single nonbleeding ulcer duodenal bulb, negative H. pylori reactive gastropathy 02/20/2016 colonoscopy for IDA 2 sessile polyps, moderate tics, prior anastomosis left colon, 6 mm AVM cecum grade 1 hemorrhoids 07/09/2021 colonoscopy Dr. Sandrea Cruel for history of polyps showed moderate diverticulosis single nonbleeding colonic AVM prior end-to-end colonic sigmoid colon anastomosis 04/28/2024  HGB 10.4 MCV 89.1 Platelets 235 05/09/2022 Iron  15 Ferritin 92 B12 664 Recent Labs    06/02/23 1314 07/02/23 1315 09/02/23 1259 12/02/23 1404 12/29/23 1336 03/22/24 0950 03/30/24 1322 04/06/24 1320 04/28/24 1334  HGB 10.7* 11.7* 13.1 13.0 12.0* 6.3* 11.0* 11.3* 10.4*  has been following with hematology completed Venofer  4/1, 4//9, 4/15, status post 1 unit PRBC 3/31 and 03/23/2024 for low HGB 6.3, no overt GI bleeding but he was having darker stool due to being on iron  Has more constipation with iron , mild LLQ AB pain he has reflux but ran out of his pill 3-6 months ago  No dysphagia, nausea, vomiting, appetite is okay, no weight loss  -With + FOBT, history of colon AVM's, discussed with patient and will schedule with soonest available at the hospital for  EGD/colon for possible APC, evaluate for ulcer, gastritis, etc. Discussed starting with EGD alone at [facility] but patient prefers to have both done and at the hospital.  Risk of bowel prep, conscious sedation, and EGD and colonoscopy were discussed. Risks include but are not limited to dehydration, pain, bleeding, cardiopulmonary process, bowel perforation, or other possible adverse outcomes.. Treatment plan was  discussed with patient, and agreed upon. -start back on pantoprazole  40 mg once a day for possible ulcer -If any overt GI bleeding go to the ER in the mean time -Continue supportive care with hematology  GERD with history of esophagitis 01/09/2016 EGD for IDA Dr. Sandrea Cruel showed class B esophagitis, gastritis, small hiatal hernia single nonbleeding ulcer duodenal bulb, negative H. 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 male with a past medical history listed below presents for evaluation of IDA worsening.   Discussed the use of AI scribe software for clinical note transcription with the patient, who gave verbal consent to proceed.  History of Present Illness   Tony Mcmillan is an 83 year old male with iron  deficiency anemia and gastrointestinal bleeding who presents for evaluation of recurrent anemia and gastrointestinal symptoms.  He has a history of iron  deficiency anemia, initially investigated in 2017 with an endoscopy and colonoscopy. The endoscopy revealed esophagitis, a duodenal ulcer, and inflammation, while the colonoscopy showed polyps and a bleeding arteriovenous malformation (AVM). In 2022, another colonoscopy identified a new AVM in the colon.  In March 2025, he experienced a significant drop in hemoglobin to 6.3 g/dL,  necessitating two blood transfusions (one in March and one in April) and three iron  infusions in April. No visible blood in his stool during this period, although his stool was darker, which he attributes to oral iron  supplementation. He has been on oral iron  consistently.  He reports chronic constipation, which he suspects may be related to iron  supplementation, and no diarrhea. He experiences reflux but has been off his reflux medication for about six months. No nausea, vomiting, or difficulty swallowing. He reports stable weight, with no significant weight loss, and maintains a good appetite.  He has a history of rheumatoid arthritis, for which he is on Remicade, and COPD, but he is not on oxygen . He also has coronary artery disease with known plaque but has never undergone catheterization or stenting. No chest pain but occasionally experiences mild chest tightness not related to exertion or meals.  He denies alcohol use and has not smoked in over 20 years. He does not take NSAIDs or other pain medications. He reports a past surgical history of an infection requiring skin removal from his leg.      He  reports that he quit smoking about 30 years ago. His smoking use included cigarettes. He started smoking about 47 years ago. He has a 17 pack-year smoking history. He has never used smokeless tobacco. He reports that he does not drink alcohol and does not use drugs.  RELEVANT GI HISTORY, IMAGING AND LABS: Results   LABS Hb: 6.3 g/dL (16/09/9603) Fecal occult blood test: Positive (05/13/2024)  DIAGNOSTIC Endoscopy: Esophagitis, class B; Duodenal bulb ulcer; Inflammation (2017) Colonoscopy: Polyps; Bleeding AVM (2017) Colonoscopy: Bleeding AVM (2022)      CBC    Component Value Date/Time   WBC 5.4 04/28/2024 1334   WBC 4.1 12/29/2023 1336   RBC 3.68 (L) 04/28/2024 1334   HGB 10.4 (L) 04/28/2024 1334   HGB 8.4 (L) 05/09/2022 1141   HCT 32.8 (L) 04/28/2024 1334   HCT 27.8 (L) 05/09/2022  1141   PLT 235 04/28/2024 1334   PLT 320 05/09/2022 1141   MCV 89.1 04/28/2024 1334   MCV 79 05/09/2022 1141   MCH 28.3 04/28/2024 1334   MCHC 31.7 04/28/2024 1334   RDW 19.9 (H) 04/28/2024 1334   RDW 16.2 (H) 05/09/2022 1141   LYMPHSABS 0.6 (L) 04/28/2024 1334   LYMPHSABS 0.7 05/09/2022 1141   MONOABS 0.5 04/28/2024 1334   EOSABS 0.1 04/28/2024 1334   EOSABS 0.1 05/09/2022 1141   BASOSABS 0.0 04/28/2024 1334   BASOSABS 0.0 05/09/2022 1141   Recent Labs    06/02/23 1314 07/02/23 1315 09/02/23 1259 12/02/23 1404 12/29/23 1336 03/22/24 0950 03/30/24 1322 04/06/24 1320 04/28/24 1334  HGB 10.7* 11.7* 13.1 13.0 12.0* 6.3* 11.0* 11.3* 10.4*    CMP     Component Value Date/Time   NA 137 12/29/2023 1336   NA 138 07/03/2022 0914   K 4.1 12/29/2023 1336   CL 105 12/29/2023 1336   CO2 25 12/29/2023 1336   GLUCOSE 104 (H) 12/29/2023 1336   BUN 14 12/29/2023 1336   BUN 13 07/03/2022 0914   CREATININE 0.97 12/29/2023 1336   CREATININE 1.43 (H) 12/27/2020 0000   CALCIUM  8.9 12/29/2023 1336   PROT 6.9 07/03/2022 0914   ALBUMIN 4.2 07/03/2022 0914   AST 20 07/03/2022 0914   ALT 20 07/03/2022 0914   ALKPHOS 61 07/03/2022 0914   BILITOT 0.2 07/03/2022 0914  GFRNONAA >60 12/29/2023 1336   GFRNONAA 47 (L) 12/27/2020 0000   GFRAA 54 (L) 12/27/2020 0000      Latest Ref Rng & Units 07/03/2022    9:14 AM 05/09/2022   11:41 AM 01/06/2022   11:49 AM  Hepatic Function  Total Protein 6.0 - 8.5 g/dL 6.9  7.2  7.6   Albumin 3.8 - 4.8 g/dL 4.2  4.4  4.2   AST 0 - 40 IU/L 20  20  21    ALT 0 - 44 IU/L 20  14  15    Alk Phosphatase 44 - 121 IU/L 61  65  55   Total Bilirubin 0.0 - 1.2 mg/dL 0.2  0.3  0.8       Current Medications:    Current Outpatient Medications (Cardiovascular):    atorvastatin  (LIPITOR) 40 MG tablet, Take 1 tablet (40 mg total) by mouth daily.   isosorbide  mononitrate (IMDUR ) 30 MG 24 hr tablet, TAKE 0.5 TABLETS BY MOUTH DAILY. PLEASE CALL OFFICE TO SCHEDULE  AN APPT FOR FURTHER REFILLS.   nitroGLYCERIN  (NITROSTAT ) 0.4 MG SL tablet, PLACE 1 TABLET UNDER THE TONGUE EVERY 5 MINUTES AS NEEDED FOR CHEST PAIN.   metoprolol  succinate (TOPROL -XL) 25 MG 24 hr tablet, Take 1 tablet (25 mg total) by mouth daily. (Patient not taking: Reported on 05/13/2024)  Current Outpatient Medications (Respiratory):    albuterol  (VENTOLIN  HFA) 108 (90 Base) MCG/ACT inhaler, Inhale 2 puffs into the lungs every 6 (six) hours as needed for wheezing or shortness of breath.   Glycopyrrolate -Formoterol  (BEVESPI  AEROSPHERE) 9-4.8 MCG/ACT AERO, Inhale 2 puffs into the lungs in the morning and at bedtime.  Current Outpatient Medications (Analgesics):    aspirin  EC (ASPIRIN  LOW DOSE) 81 MG tablet, TAKE 1 TABLET BY MOUTH DAILY *SWALLOW WHOLE*   leflunomide (ARAVA) 20 MG tablet, Take 20 mg by mouth daily.   Current Outpatient Medications (Other):    Calcium  Carb-Cholecalciferol (CALCIUM -VITAMIN D3) 600-400 MG-UNIT TABS, Take 1 tablet by mouth 2 (two) times daily.   docusate sodium (COLACE) 250 MG capsule, Take 250 mg by mouth daily.   latanoprost (XALATAN) 0.005 % ophthalmic solution, Place 1 drop into both eyes at bedtime.   MYRBETRIQ 50 MG TB24 tablet, Take 50 mg by mouth daily.    Na Sulfate-K Sulfate-Mg Sulfate concentrate (SUPREP) 17.5-3.13-1.6 GM/177ML SOLN, Use as directed; may use generic; goodrx card if insurance will not cover generic   polyethylene glycol (MIRALAX ) 17 g packet, Take 17 g by mouth daily as needed.   REMICADE 100 MG injection,    pantoprazole  (PROTONIX ) 40 MG tablet, Take 1 tablet (40 mg total) by mouth daily.  Medical History:  Past Medical History:  Diagnosis Date   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 [facility].     Atypical chest pain    a. 09/2016 MV: EF 63%, normal perfusion.    Benign prostatic hyperplasia 04/03/2016   Asymptomatic. Normal PSA and exam by Dr. [facility] 01/2016.     Bilateral carotid bruits 02/09/2019   Bladder wall thickening 03/10/2020   CAD (coronary artery disease)    Coronary artery calcification on Chest CT in 09/2016 and 01/2019 // Myoview  10/17: normal perfusion // Myoview  01/2019: no ischemia or infarction, EF 58 // Coronary CTA 03/2020: Calcium  Score 1067; non-obstructive CAD [oLM 1-24, LAD ost 25049, mid and  dist 1-24; D1 1-24; LCx prox and mid 1-24, OM1 and OM2 25-49, RCA and PLB 1-24; PLA 1-24]    Carotid bruit    Carotid Dopplers 01/2019: no ICA stenosis bilaterally    Chronic wrist pain 07/02/2016   Colonic polyp 02/23/2016   COVID-19    DDD (degenerative disc disease), 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 L4-5 and L5-S1    Diastolic dysfunction    a. 2015 Echo: EF >55%;  b. 09/2016 Echo: EF 55-60%, no rwma, Gr1 DD, triv AI/MR, mild TR, PASP .   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   On CT 01/2016    Palpitations    Echo 10/17: Mild focal basal septal hypertrophy, EF 55-60, no RWMA, Gr 1 DD, trivial MR, normal RVSF, mild TR, PASP 28 // Event monitor 01/2019: NSR, PACs   Prediabetes 10/29/2018   Pulmonary emphysema (HCC) 12/18/2016   Renal cyst, left 04/03/2016   On CT, benign    Rheumatoid arthritis (HCC)    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   -RF,-CCP,-1433eta, erosive disease with contractures   Tubular adenoma of colon  01/2016   Vitamin D  deficiency 11/04/2016   Allergies: No Known Allergies   Surgical History:  He  has a past surgical history that includes Anterior cervical corpectomy; Anterior cervical corpectomy (12/2014); Spine surgery; Colostomy reversal; Cataract extraction; Hernia repair; Colonoscopy; Polypectomy; Carpal tunnel release (Left, 09/29/2023); and Carpal tunnel release (Left). Family History:  His family history includes Diabetes in his brother, father, and sister; Heart disease in his brother and father; Hyperlipidemia in his sister; Hypertension in his father and mother; Stroke in his brother.  REVIEW OF SYSTEMS  : All other systems reviewed and negative except where noted in the History of Present Illness.  PHYSICAL EXAM: BP 122/62   Pulse 89   Ht 5\' 2"  (1.575 m)   Wt 131 lb (59.4 kg)   BMI 23.96 kg/m  Physical Exam   GENERAL APPEARANCE: Well nourished, in no apparent distress. HEENT: No cervical lymphadenopathy, unremarkable thyroid , sclerae anicteric, conjunctiva pink. RESPIRATORY: Respiratory effort normal, breath sounds clear to auscultation bilaterally without rales, rhonchi, or wheezing. CARDIO: Regular rate and rhythm with no murmurs, rubs, or gallops, peripheral pulses intact. ABDOMEN: Soft, non-distended, active bowel sounds in all four quadrants, epigastric tenderness present, no rebound, no mass appreciated. RECTAL: Positive fecal occult blood test on exam, no masses appreciated MUSCULOSKELETAL: Full range of motion, antalgic gait with cane, without edema. SKIN: Dry, intact without rashes or lesions. No jaundice. NEURO: Alert, oriented, no focal deficits. PSYCH: Cooperative, normal mood and affect.      Tony Gottron, PA-C 9:29 AM

## 2024-05-13 NOTE — Patient Instructions (Addendum)
 Advised to go to the ER if there is any severe weakness, severe abdominal pain, vomit blood, dark red blood in your bowel movement, shortness of breath or chest pain  Continue follow up with hematology  You have been scheduled for an endoscopy and colonoscopy. Please follow the written instructions given to you at your visit today.  If you use inhalers (even only as needed), please bring them with you on the day of your procedure.  DO NOT TAKE 7 DAYS PRIOR TO TEST- Trulicity (dulaglutide) Ozempic, Wegovy (semaglutide) Mounjaro (tirzepatide) Bydureon Bcise (exanatide extended release)  DO NOT TAKE 1 DAY PRIOR TO YOUR TEST Rybelsus (semaglutide) Adlyxin (lixisenatide) Victoza (liraglutide) Byetta (exanatide) ___________________________________________________________________________  We have sent the following medications to your pharmacy for you to pick up at your convenience: Suprep  _______________________________________________________  If your blood pressure at your visit was 140/90 or greater, please contact your primary care physician to follow up on this.  _______________________________________________________  If you are age 38 or older, your body mass index should be between 23-30. Your Body mass index is 23.96 kg/m. If this is out of the aforementioned range listed, please consider follow up with your Primary Care Provider.  If you are age 3 or younger, your body mass index should be between 19-25. Your Body mass index is 23.96 kg/m. If this is out of the aformentioned range listed, please consider follow up with your Primary Care Provider.   ________________________________________________________  The [facility] providers would like to encourage you to use MYCHART to communicate with providers for non-urgent requests or questions.  Due to long hold times on the telephone, sending your provider a message by [facility] may be a faster and more efficient way to get a  response.  Please allow 48 business hours for a response.  Please remember that this is for non-urgent requests.  _______________________________________________________  Thank you for entrusting me with your care and choosing [facility].  Santina Cull PA-C

## 2024-05-14 NOTE — Progress Notes (Signed)
 Agree with the assessment and plan as outlined by Quentin Mulling, PA-C. ? ?Keron Neenan, DO, FACG ? ?

## 2024-05-31 ENCOUNTER — Ambulatory Visit: Admitting: Orthopedic Surgery

## 2024-06-02 ENCOUNTER — Ambulatory Visit (INDEPENDENT_AMBULATORY_CARE_PROVIDER_SITE_OTHER): Admitting: Orthopedic Surgery

## 2024-06-02 ENCOUNTER — Encounter: Payer: Self-pay | Admitting: Orthopedic Surgery

## 2024-06-02 DIAGNOSIS — M19012 Primary osteoarthritis, left shoulder: Secondary | ICD-10-CM | POA: Diagnosis not present

## 2024-06-02 NOTE — Progress Notes (Signed)
 Office Visit Note   Patient: Tony Mcmillan           Date of Birth: 1941/07/16           MRN: [id number] Visit Date: 06/02/2024 Requested by: Annella Kief, NP [address] [place],  [place] [address] PCP: Annella Kief, NP  Subjective: Chief Complaint  Patient presents with   Neck - Follow-up, Pain   Left Shoulder - Follow-up, Pain    HPI: Tony Mcmillan is a 83 y.o. male who presents to the office reporting left shoulder pain.  He did have an epidural steroid injection in the cervical spine in April.  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  His symptoms on the left are most severe in the shoulder region.  Not having too much in terms of right arm radicular symptoms despite the symmetric nature of the foraminal stenosis severity.  Hard for him to lay on that left-hand side.  He is getting worked up for blood in his stool by colonoscopy.  He does live by himself but has many children that come by to visit him..                ROS: All systems reviewed are negative as they relate to the chief complaint within the history of present illness.  Patient denies fevers or chills.  Assessment & Plan: Visit Diagnoses:  1. 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  I think we could help him with that with the shoulder replacement.  He is at the point now where the risk of elective surgeries is outweighed by the potential benefit from a quality-of-life perspective.  Plan for an CT scan of the left shoulder for preop shoulder replacement.  He does need to undergo risk stratification with his cardiologist prior to this elective surgery.  He will also need to have this colonoscopy issue resolved as well.  He will follow-up in 3 weeks for clinical recheck reevaluation and potential further discussion about shoulder replacement.  I did review the risk and benefits with him today some but did not use  the models.  Follow-Up Instructions: Return in about 3 weeks (around 06/23/2024) for [facility].   Orders:  Orders Placed This Encounter  Procedures   CT SHOULDER LEFT WO CONTRAST   Ambulatory referral to Cardiology   No orders of the defined types were placed in this encounter.     Procedures: No procedures performed   Clinical Data: No additional findings.  Objective: Vital Signs: There were no vitals taken for this visit.  Physical Exam:  Constitutional: Patient appears well-developed HEENT:  Head: Normocephalic Eyes:EOM are normal Neck: Normal range of motion Cardiovascular: Normal rate Pulmonary/chest: Effort normal Neurologic: Patient is alert Skin: Skin is warm Psychiatric: Patient has normal mood and affect  Ortho Exam: Ortho exam demonstrates range of motion on the left of 20/50/85.  Deltoid is functional.  Pretty painful with range of motion.  Has symmetric grip EPL FPL interosseous wrist flexion extension biceps triceps and deltoid strength.  Radial pulse intact bilaterally.  Specialty Comments:  Narrative & Impression [facility]   NEUROIMAGING REPORT     STUDY DATE: 07/01/23  MR CERVICAL SPINE WO CONTRAST     COMPARISON: 03/22/23 CT    FINDINGS:    On sagittal views the vertebral bodies have normal height and alignment.  Anterior cervical discectomy and fusion with metal  hardware (anteriorly and posteriorly) spanning C2-C6 levels with interbody fusion device spanning C3-C5 vertebral bodies. 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  The posterior fossa, pituitary gland and paraspinal soft tissues are unremarkable.    On axial views: C2-3 no spinal stenosis or foraminal narrowing C3-4 no spinal stenosis or foraminal narrowing C4-5 no spinal stenosis or foraminal narrowing  C5-6 no spinal stenosis or foraminal narrowing C6-7 no spinal stenosis or foraminal narrowing C7-T1 disc bulging and uncovertebral  joint hypertrophy with severe bilateral foraminal stenosis T1-2 disc bulging and facet hypertrophy with mild bilateral foraminal stenosis T2-3 no spinal stenosis or foraminal narrowing   Limited views of the soft tissues of the head and neck are unremarkable.     IMPRESSION:    MRI cervical spine (without) demonstrating: - Cervical discectomy and fusion spanning C2-C6 with anterior and posterior hardware and interbody fusion device from C3-C5 levels. - At C7-T1 disc bulging and uncovertebral joint hypertrophy with severe bilateral foraminal stenosis. - At T1-2 disc bulging and facet hypertrophy with mild bilateral foraminal stenosis.       INTERPRETING PHYSICIAN:  Omega Bible, MD Certified in Neurology, Neurophysiology and Neuroimaging   [facility] [address], [address] [place], [place] [address] [phone]  ----- EMG/NCS 2024 Conclusion:  1. 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  2. 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 3.  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  Imaging: No results found.   PMFS History: Patient Active Problem List   Diagnosis Date Noted   Precordial chest pain 01/04/2024   Carpal tunnel syndrome, left upper limb 09/15/2023   Cubital tunnel syndrome on left 09/15/2023   Iron  deficiency anemia due to chronic blood loss 06/02/2023   Occipital neuralgia of left side 03/26/2023   COPD (chronic obstructive pulmonary  disease) (HCC) 08/08/2022   Olecranon bursitis of right elbow 05/24/2022   Other long term (current) drug therapy 05/09/2022   Pulmonary fibrosis (HCC) 05/09/2022   Absolute anemia 05/09/2022   Mixed hyperlipidemia 01/21/2022   Premature atrial contractions 01/20/2022   CAD (coronary artery disease)    Enlarged prostate 03/10/2020   Aortic valve calcification 03/10/2020   Bladder wall thickening 03/10/2020   Weight loss 11/15/2019   Bilateral carotid bruits 02/09/2019   History of systemic steroid therapy 10/30/2017   Pulmonary emphysema (HCC) 12/18/2016   Rheumatoid arthritis (HCC) 11/04/2016   High risk medication use 11/04/2016   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   Vitamin D  deficiency 11/04/2016   Asymptomatic microscopic hematuria 04/03/2016   Benign prostatic hyperplasia 04/03/2016   Renal cyst, left 04/03/2016   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 [facility].     Atypical chest  pain    a. 09/2016 MV: EF 63%, normal perfusion.   Benign prostatic hyperplasia 04/03/2016   Asymptomatic. Normal PSA and exam by Dr. [facility]ingo [facility] 01/2016.     Bilateral carotid bruits 02/09/2019   Bladder wall thickening 03/10/2020   CAD (coronary artery disease)    Coronary artery calcification on Chest CT in 09/2016 and 01/2019 // Myoview  10/17: normal perfusion // Myoview  01/2019: no ischemia or infarction, EF 58 // Coronary CTA 03/2020: Calcium  Score 1067; non-obstructive CAD [oLM 1-24, LAD ost 25049, mid and dist 1-24; D1 1-24; LCx prox and mid 1-24, OM1 and OM2 25-49, RCA and PLB 1-24; PLA 1-24]    Carotid bruit    Carotid Dopplers  01/2019: no ICA stenosis bilaterally    Chronic wrist pain 07/02/2016   Colonic polyp 02/23/2016   COVID-19    DDD (degenerative disc disease), 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 L4-5 and L5-S1    Diastolic dysfunction    a. 2015 Echo: EF >55%;  b. 09/2016 Echo: EF 55-60%, no rwma, Gr1 DD, triv AI/MR, mild TR, PASP .   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   On CT 01/2016    Palpitations    Echo 10/17: Mild focal basal septal hypertrophy, EF 55-60, no RWMA, Gr 1 DD, trivial MR, normal RVSF, mild TR, PASP 28 // Event monitor 01/2019: NSR, PACs   Prediabetes 10/29/2018   Pulmonary emphysema (HCC) 12/18/2016   Renal cyst, left 04/03/2016   On CT, benign    Rheumatoid arthritis (HCC)    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   -RF,-CCP,-1433eta, erosive disease with contractures   Tubular adenoma of colon 01/2016   Vitamin D  deficiency 11/04/2016    Family History  Problem Relation Age of Onset   Hypertension Mother    Hypertension Father    Diabetes Father    Heart disease Father        stents   Hyperlipidemia Sister    Diabetes Sister    Stroke Brother    Diabetes Brother    Heart disease Brother        stents   Colon polyps Neg Hx    Esophageal cancer Neg Hx    Stomach cancer Neg Hx    Rectal cancer Neg Hx    Colon cancer Neg Hx     Past Surgical History:  Procedure Laterality Date   ANTERIOR CERVICAL CORPECTOMY     ANTERIOR CERVICAL CORPECTOMY  12/2014   for infection. this was in  [place]   CARPAL TUNNEL RELEASE Left 09/29/2023   CARPAL TUNNEL RELEASE Left    CATARACT EXTRACTION     both eyes   COLONOSCOPY     COLOSTOMY REVERSAL     had infection on buttocks and had to have a skin graft- had colostomy to help area stay clean and heal   HERNIA REPAIR     POLYPECTOMY     SPINE SURGERY     Social History   Occupational History   Not on  file  Tobacco Use   Smoking status: Former    Current packs/day: 0.00    Average packs/day: 1 pack/day for 17.0 years (17.0 ttl pk-yrs)    Types: Cigarettes    Start date: 12/23/1976    Quit date: 12/23/1993    Years since quitting: 30.4   Smokeless tobacco: Never  Vaping Use   Vaping status: Never Used  Substance and Sexual Activity   Alcohol use: No    Alcohol/week: 0.0 standard drinks of alcohol   Drug use: No   Sexual activity: Yes

## 2024-06-08 ENCOUNTER — Encounter: Payer: Self-pay | Admitting: Orthopedic Surgery

## 2024-06-09 ENCOUNTER — Telehealth: Payer: Self-pay | Admitting: Gastroenterology

## 2024-06-09 NOTE — Telephone Encounter (Signed)
 Procedure:Colonoscopy/Endoscopy Procedure date: 06/17/24 Procedure location: [facility] Arrival Time: 6:45 am Spoke with the patient Y/N: Yes Any prep concerns? No  Has the patient obtained the prep from the pharmacy ? Yes Do you have a care partner and transportation: Yes Any additional concerns? No

## 2024-06-10 ENCOUNTER — Inpatient Hospital Stay: Attending: Oncology

## 2024-06-10 ENCOUNTER — Ambulatory Visit
Admission: RE | Admit: 2024-06-10 | Discharge: 2024-06-10 | Disposition: A | Discharge status: Home / Self Care | Source: Ambulatory Visit | Attending: Orthopedic Surgery | Admitting: Orthopedic Surgery

## 2024-06-10 DIAGNOSIS — M19012 Primary osteoarthritis, left shoulder: Secondary | ICD-10-CM

## 2024-06-10 DIAGNOSIS — D508 Other iron deficiency anemias: Secondary | ICD-10-CM | POA: Insufficient documentation

## 2024-06-10 LAB — CBC WITH DIFFERENTIAL (CANCER CENTER ONLY)
Abs Immature Granulocytes: 0 10*3/uL (ref 0.00–0.07)
Basophils Absolute: 0 10*3/uL (ref 0.0–0.1)
Basophils Relative: 1 %
Eosinophils Absolute: 0.1 10*3/uL (ref 0.0–0.5)
Eosinophils Relative: 2 %
HCT: 28.6 % — ABNORMAL LOW (ref 39.0–52.0)
Hemoglobin: 9.4 g/dL — ABNORMAL LOW (ref 13.0–17.0)
Immature Granulocytes: 0 %
Lymphocytes Relative: 13 %
Lymphs Abs: 0.6 10*3/uL — ABNORMAL LOW (ref 0.7–4.0)
MCH: 27.7 pg (ref 26.0–34.0)
MCHC: 32.9 g/dL (ref 30.0–36.0)
MCV: 84.4 fL (ref 80.0–100.0)
Monocytes Absolute: 0.6 10*3/uL (ref 0.1–1.0)
Monocytes Relative: 12 %
Neutro Abs: 3.5 10*3/uL (ref 1.7–7.7)
Neutrophils Relative %: 72 %
Platelet Count: 302 10*3/uL (ref 150–400)
RBC: 3.39 MIL/uL — ABNORMAL LOW (ref 4.22–5.81)
RDW: 16.4 % — ABNORMAL HIGH (ref 11.5–15.5)
WBC Count: 4.9 10*3/uL (ref 4.0–10.5)
nRBC: 0 % (ref 0.0–0.2)

## 2024-06-10 LAB — FERRITIN: Ferritin: 34 ng/mL (ref 24–336)

## 2024-06-14 ENCOUNTER — Telehealth: Payer: Self-pay

## 2024-06-14 ENCOUNTER — Other Ambulatory Visit: Payer: Self-pay

## 2024-06-14 DIAGNOSIS — D649 Anemia, unspecified: Secondary | ICD-10-CM

## 2024-06-14 NOTE — Telephone Encounter (Signed)
 Patient gave verbal understanding and had no further questions or concerns

## 2024-06-14 NOTE — Telephone Encounter (Signed)
-----   Message from Olam Ned sent at 06/11/2024  3:40 PM EDT ----- Please let him know hemoglobin and ferritin are lower. Schedule lab in 2 weeks (CBC and ferritin).

## 2024-06-17 ENCOUNTER — Ambulatory Visit (HOSPITAL_COMMUNITY): Admitting: Anesthesiology

## 2024-06-17 ENCOUNTER — Other Ambulatory Visit: Payer: Self-pay

## 2024-06-17 ENCOUNTER — Encounter (HOSPITAL_COMMUNITY)
Admission: RE | Disposition: A | Discharge status: Home / Self Care | Payer: Self-pay | Source: Ambulatory Visit | Attending: Gastroenterology

## 2024-06-17 ENCOUNTER — Encounter (HOSPITAL_COMMUNITY): Payer: Self-pay | Admitting: Gastroenterology

## 2024-06-17 ENCOUNTER — Ambulatory Visit (HOSPITAL_COMMUNITY)
Admission: RE | Admit: 2024-06-17 | Discharge: 2024-06-17 | Disposition: A | Discharge status: Home / Self Care | Source: Ambulatory Visit | Attending: Gastroenterology | Admitting: Gastroenterology

## 2024-06-17 DIAGNOSIS — K5520 Angiodysplasia of colon without hemorrhage: Secondary | ICD-10-CM

## 2024-06-17 DIAGNOSIS — K5730 Diverticulosis of large intestine without perforation or abscess without bleeding: Secondary | ICD-10-CM | POA: Diagnosis not present

## 2024-06-17 DIAGNOSIS — Z79899 Other long term (current) drug therapy: Secondary | ICD-10-CM | POA: Insufficient documentation

## 2024-06-17 DIAGNOSIS — I2510 Atherosclerotic heart disease of native coronary artery without angina pectoris: Secondary | ICD-10-CM

## 2024-06-17 DIAGNOSIS — Z87891 Personal history of nicotine dependence: Secondary | ICD-10-CM | POA: Insufficient documentation

## 2024-06-17 DIAGNOSIS — Z980 Intestinal bypass and anastomosis status: Secondary | ICD-10-CM | POA: Diagnosis not present

## 2024-06-17 DIAGNOSIS — K2950 Unspecified chronic gastritis without bleeding: Secondary | ICD-10-CM | POA: Diagnosis not present

## 2024-06-17 DIAGNOSIS — J449 Chronic obstructive pulmonary disease, unspecified: Secondary | ICD-10-CM | POA: Diagnosis not present

## 2024-06-17 DIAGNOSIS — K31A19 Gastric intestinal metaplasia without dysplasia, unspecified site: Secondary | ICD-10-CM

## 2024-06-17 DIAGNOSIS — K269 Duodenal ulcer, unspecified as acute or chronic, without hemorrhage or perforation: Secondary | ICD-10-CM

## 2024-06-17 DIAGNOSIS — K31819 Angiodysplasia of stomach and duodenum without bleeding: Secondary | ICD-10-CM | POA: Diagnosis not present

## 2024-06-17 DIAGNOSIS — D509 Iron deficiency anemia, unspecified: Secondary | ICD-10-CM

## 2024-06-17 DIAGNOSIS — Z860100 Personal history of colon polyps, unspecified: Secondary | ICD-10-CM

## 2024-06-17 DIAGNOSIS — K3189 Other diseases of stomach and duodenum: Secondary | ICD-10-CM

## 2024-06-17 DIAGNOSIS — K6289 Other specified diseases of anus and rectum: Secondary | ICD-10-CM

## 2024-06-17 HISTORY — PX: ESOPHAGOGASTRODUODENOSCOPY: SHX5428

## 2024-06-17 HISTORY — PX: COLONOSCOPY: SHX5424

## 2024-06-17 SURGERY — COLONOSCOPY
Anesthesia: Monitor Anesthesia Care

## 2024-06-17 MED ORDER — SODIUM CHLORIDE 0.9 % IV SOLN: INTRAVENOUS | Status: DC

## 2024-06-17 MED ORDER — PROPOFOL 500 MG/50ML IV EMUL
INTRAVENOUS | Status: DC | PRN
  Administered 2024-06-17: 100 ug/kg/min via INTRAVENOUS

## 2024-06-17 MED ORDER — PHENYLEPHRINE HCL (PRESSORS) 10 MG/ML IV SOLN
INTRAVENOUS | Status: DC | PRN
  Administered 2024-06-17 (×2): 80 ug via INTRAVENOUS

## 2024-06-17 NOTE — Anesthesia Preprocedure Evaluation (Addendum)
 Anesthesia Evaluation  Patient identified by MRN, date of birth, ID band Patient awake    Reviewed: Allergy  & Precautions, H&P , NPO status , Patient's Chart, lab work & pertinent test results  Airway Mallampati: II  TM Distance: >3 FB Neck ROM: Full    Dental  (+) Edentulous Upper, Edentulous Lower   Pulmonary COPD, former smoker   Pulmonary exam normal breath sounds clear to auscultation       Cardiovascular + CAD  Normal cardiovascular exam Rhythm:Regular Rate:Normal     Neuro/Psych  Headaches  negative psych ROS   GI/Hepatic Neg liver ROS,GERD  ,,  Endo/Other  negative endocrine ROS    Renal/GU Renal InsufficiencyRenal disease  negative genitourinary   Musculoskeletal  (+) Arthritis , Osteoarthritis,    Abdominal   Peds negative pediatric ROS (+)  Hematology  (+) Blood dyscrasia, anemia   Anesthesia Other Findings   Reproductive/Obstetrics negative OB ROS                             Anesthesia Physical Anesthesia Plan  ASA: 3  Anesthesia Plan: MAC   Post-op Pain Management: Minimal or no pain anticipated   Induction: Intravenous  PONV Risk Score and Plan: 1 and Propofol  infusion and Treatment may vary due to age or medical condition  Airway Management Planned: Nasal Cannula  Additional Equipment:   Intra-op Plan:   Post-operative Plan:   Informed Consent: I have reviewed the patients History and Physical, chart, labs and discussed the procedure including the risks, benefits and alternatives for the proposed anesthesia with the patient or authorized representative who has indicated his/her understanding and acceptance.     Dental advisory given  Plan Discussed with: CRNA  Anesthesia Plan Comments:        Anesthesia Quick Evaluation

## 2024-06-17 NOTE — Transfer of Care (Signed)
 Immediate Anesthesia Transfer of Care Note  Patient: Tony Mcmillan  Procedure(s) Performed: COLONOSCOPY EGD (ESOPHAGOGASTRODUODENOSCOPY)  Patient Location: PACU  Anesthesia Type:MAC  Level of Consciousness: awake and alert   Airway & Oxygen  Therapy: Patient Spontanous Breathing and Patient connected to nasal cannula oxygen   Post-op Assessment: Report given to RN, Post -op Vital signs reviewed and stable, and Patient moving all extremities X 4  Post vital signs: Reviewed and stable  Last Vitals:  Vitals Value Taken Time  BP 132/65   Temp 98   Pulse 94 06/17/24 09:24  Resp 28 06/17/24 09:24  SpO2 98 % 06/17/24 09:24  Vitals shown include unfiled device data.  Last Pain:  Vitals:   06/17/24 0726  TempSrc: Tympanic  PainSc: 0-No pain         Complications: There were no known notable events for this encounter.

## 2024-06-17 NOTE — H&P (Addendum)
 GASTROENTEROLOGY PROCEDURE H&P NOTE   Primary Care Physician: Oris Camie BRAVO, NP    Reason for Procedure:  Iron  deficiency anemia, GERD, constipation, history of colon polyps, history of AVMs, FOBT positive  Plan:    EGD, colonoscopy   Patient is appropriate for endoscopic procedure(s) at [facility].  The nature of the procedure, as well as the risks, benefits, and alternatives were carefully and thoroughly reviewed with the patient. Ample time for discussion and questions allowed. The patient understood, was satisfied, and agreed to proceed.     HPI: Tony Mcmillan is a 83 y.o. male who presents for EGD and colonoscopy for evaluation of iron  deficiency anemia.  Known history of AVMs on prior colonoscopy in 2017 and 2022, so procedures scheduled at the hospital endoscopy unit to allow for additional endoscopic interventions as necessary.  Has had recurrence of iron  deficiency anemia requiring PRBC transfusion x 2 and IV iron  earlier this year.  Has had good serologic response with most recent H/H9.4/28.6 and ferritin 34.  Recent FOBT positive.  Does have a prior known history of IDA with previous endoscopic evaluation as below: 01/09/2016 EGD for IDA Dr. Aneita showed class B esophagitis, gastritis, small hiatal hernia single nonbleeding ulcer duodenal bulb, negative H. pylori reactive gastropathy 02/20/2016 colonoscopy for IDA 2 sessile polyps, moderate tics, prior anastomosis left colon, 6 mm AVM cecum grade 1 hemorrhoids 07/09/2021 colonoscopy Dr. Aneita for history of polyps showed moderate diverticulosis single nonbleeding colonic AVM prior end-to-end colonic sigmoid colon anastomosis  Has been taking oral iron  which makes stools dark and somewhat constipating.  Was prescribed pantoprazole  last month for his reflux (ran out 3-6 months prior), but he has not yet picked up, so still having reflux symptoms.    Past Medical History:  Diagnosis Date   3-vessel  coronary artery disease 03/10/2020   Allergy     Anemia    Aortic valve calcification 03/10/2020   Arthritis    Asymptomatic microscopic hematuria 04/03/2016   Cystoscopy 02/207 Dr. [facility].     Atypical chest pain    a. 09/2016 MV: EF 63%, normal perfusion.   Benign prostatic hyperplasia 04/03/2016   Asymptomatic. Normal PSA and exam by Dr. [facility] 01/2016.     Bilateral carotid bruits 02/09/2019   Bladder wall thickening 03/10/2020   CAD (coronary artery disease)    Coronary artery calcification on Chest CT in 09/2016 and 01/2019 // Myoview  10/17: normal perfusion // Myoview  01/2019: no ischemia or infarction, EF 58 // Coronary CTA 03/2020: Calcium  Score 1067; non-obstructive CAD [oLM 1-24, LAD ost 25049, mid and dist 1-24; D1 1-24; LCx prox and mid 1-24, OM1 and OM2 25-49, RCA and PLB 1-24; PLA 1-24]    Carotid bruit    Carotid Dopplers 01/2019: no ICA stenosis bilaterally    Chronic wrist pain 07/02/2016   Colonic polyp 02/23/2016   COVID-19    DDD (degenerative disc disease), 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 L4-5 and L5-S1    Diastolic dysfunction    a. 2015 Echo: EF >55%;  b. 09/2016 Echo: EF 55-60%, no rwma, Gr1 DD, triv AI/MR, mild TR, PASP .   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   On CT 01/2016    Palpitations    Echo 10/17: Mild focal basal septal hypertrophy, EF 55-60, no RWMA, Gr 1 DD, trivial MR, normal RVSF, mild TR, PASP 28 // Event monitor 01/2019: NSR, PACs   Prediabetes  10/29/2018   Pulmonary emphysema (HCC) 12/18/2016   Renal cyst, left 04/03/2016   On CT, benign    Rheumatoid arthritis (HCC)    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 this was in [place]   CARPAL TUNNEL RELEASE Left 09/29/2023   CARPAL TUNNEL RELEASE Left    CATARACT EXTRACTION     both eyes   COLONOSCOPY     COLOSTOMY REVERSAL     had infection on buttocks and had to have a skin graft- had colostomy to help area stay clean and heal   HERNIA REPAIR     POLYPECTOMY     SPINE SURGERY      Prior to Admission medications   Medication Sig Start Date End Date Taking? Authorizing Provider  albuterol  (VENTOLIN  HFA) 108 (90 Base) MCG/ACT inhaler Inhale 2 puffs into the lungs every 6 (six) hours as needed for wheezing or shortness of breath. 03/19/24  Yes Parrett, Tammy S, NP  aspirin  EC (ASPIRIN  LOW DOSE) 81 MG tablet TAKE 1 TABLET BY MOUTH DAILY *SWALLOW WHOLE* 02/05/24  Yes Weaver, Scott T, PA-C  atorvastatin  (LIPITOR) 40 MG tablet Take 1 tablet (40 mg total) by mouth daily. 01/28/24  Yes Weaver, Scott T, PA-C  Calcium  Carb-Cholecalciferol (CALCIUM -VITAMIN D3) 600-400 MG-UNIT TABS Take 1 tablet by mouth 2 (two) times daily.   Yes [provider]  Glycopyrrolate -Formoterol  (BEVESPI  AEROSPHERE) 9-4.8 MCG/ACT AERO Inhale 2 puffs into the lungs in the morning and at bedtime. 03/19/24  Yes Parrett, Tammy S, NP  isosorbide  mononitrate (IMDUR ) 30 MG 24 hr tablet TAKE 0.5 TABLETS BY MOUTH DAILY. PLEASE CALL OFFICE TO SCHEDULE AN APPT FOR FURTHER REFILLS. 10/27/23  Yes Weaver, Scott T, PA-C  latanoprost (XALATAN) 0.005 % ophthalmic solution Place 1 drop into both eyes at bedtime. 11/14/23  Yes [provider]  leflunomide (ARAVA) 20 MG tablet Take 20 mg by mouth daily.   Yes [provider]  MYRBETRIQ 50 MG TB24 tablet Take 50 mg by mouth daily.  02/07/19  Yes [provider]  Na Sulfate-K Sulfate-Mg Sulfate concentrate (SUPREP) 17.5-3.13-1.6 GM/177ML SOLN Use as directed; may use generic; goodrx card if insurance will not cover generic 05/13/24  Yes Craig Alan SAUNDERS, PA-C  REMICADE 100 MG injection  03/05/23  Yes [provider]  docusate sodium (COLACE) 250 MG capsule Take 250 mg by mouth daily.    [provider]  metoprolol  succinate (TOPROL -XL) 25 MG 24 hr tablet Take 1 tablet (25 mg total) by mouth daily. Patient not taking: Reported on 05/13/2024 12/30/22   Hobart Powell BRAVO, MD  nitroGLYCERIN  (NITROSTAT ) 0.4 MG SL tablet PLACE 1 TABLET UNDER THE TONGUE EVERY 5 MINUTES AS NEEDED FOR CHEST PAIN. 11/18/23   Verlin Lonni BIRCH, MD  pantoprazole  (PROTONIX )  40 MG tablet Take 1 tablet (40 mg total) by mouth daily. 05/13/24   Craig Alan SAUNDERS, PA-C  polyethylene glycol (MIRALAX ) 17 g packet Take 17 g by mouth daily as needed. 05/27/22   Debby Olam POUR, NP    No current facility-administered medications for this encounter.    Allergies as of 05/13/2024   (No Known Allergies)    Family History  Problem Relation Age of Onset   Hypertension Mother    Hypertension Father    Diabetes Father    Heart disease Father        stents   Hyperlipidemia Sister    Diabetes Sister    Stroke Brother    Diabetes Brother    Heart disease Brother        stents   Colon polyps Neg Hx    Esophageal cancer Neg Hx    Stomach cancer Neg Hx    Rectal cancer Neg Hx    Colon cancer Neg Hx     Social History   Socioeconomic History   Marital status: Widowed    Spouse name: Not on file   Number of children: Not on file   Years of education: Not on file   Highest education level: Not on file  Occupational History   Not on file  Tobacco Use   Smoking  status: Former    Current packs/day: 0.00    Average packs/day: 1 pack/day for 17.0 years (17.0 ttl pk-yrs)    Types: Cigarettes    Start date: 12/23/1976    Quit date: 12/23/1993    Years since quitting: 30.5   Smokeless tobacco: Never  Vaping Use   Vaping status: Never Used  Substance and Sexual Activity   Alcohol use: No    Alcohol/week: 0.0 standard drinks of alcohol   Drug use: No   Sexual activity: Yes  Other Topics Concern   Not on file  Social History Narrative   Retired. Lives alone.    Social Drivers of Corporate investment banker Strain: Low Risk  (12/16/2023)   Overall Financial Resource Strain (CARDIA)    Difficulty of Paying Living Expenses: Not hard at all  Food Insecurity: No Food Insecurity (12/16/2023)   Hunger Vital Sign    Worried About Running Out of Food in the Last Year: Never true    Ran Out of Food in the Last Year: Never true  Transportation Needs: No Transportation Needs (12/16/2023)   PRAPARE - Administrator, Civil Service (Medical): No    Lack of Transportation (Non-Medical): No  Physical Activity: Insufficiently Active (12/16/2023)   Exercise Vital Sign    Days of Exercise per Week: 2 days    Minutes of Exercise per Session: 10 min  Stress: No Stress Concern Present (12/16/2023)   [employer] of Occupational Health - Occupational Stress Questionnaire    Feeling of Stress : Not at all  Social Connections: Moderately Isolated (12/16/2023)   Social Connection and Isolation Panel    Frequency of Communication with Friends and Family: More than three times a week    Frequency of Social Gatherings with Friends and Family: Once a week    Attends Religious Services: More than 4 times per year    Active Member of [employer] or Organizations: No    Attends Banker Meetings: Never    Marital Status: Widowed  Intimate Partner Violence: Not At Risk (12/16/2023)   Humiliation, Afraid, Rape, and Kick questionnaire    Fear of  Current or Ex-Partner: No    Emotionally Abused: No    Physically Abused: No    Sexually Abused: No    Physical Exam: Vital signs in last 24 hours: @BP  (!) 148/80   Pulse 82   Resp 20   Ht 5' 2 (1.575 m)   Wt 59 kg   SpO2 95%   BMI 23.78 kg/m  GEN: NAD EYE: Sclerae anicteric ENT: MMM CV: Non-tachycardic Pulm: CTA b/l GI: Soft, NT/ND NEURO:  Alert & Oriented x 3   Tony Flatter, DO [facility]   06/17/2024 7:31 AM

## 2024-06-17 NOTE — Discharge Instructions (Signed)

## 2024-06-17 NOTE — Interval H&P Note (Signed)
 History and Physical Interval Note:  06/17/2024 7:40 AM  Tony Mcmillan  has presented today for surgery, with the diagnosis of IDA,AVM (arteriovenous malformation) of colon,Duodenal ulcer.  The various methods of treatment have been discussed with the patient and family. After consideration of risks, benefits and other options for treatment, the patient has consented to  Procedure(s): COLONOSCOPY (N/A) EGD (ESOPHAGOGASTRODUODENOSCOPY) (N/A) as a surgical intervention.  The patient's history has been reviewed, patient examined, no change in status, stable for surgery.  I have reviewed the patient's chart and labs.  Questions were answered to the patient's satisfaction.     Sandor GAILS Galadriel Shroff

## 2024-06-17 NOTE — Op Note (Signed)
 [facility] Patient Name: Tony Mcmillan Procedure Date : 06/17/2024 MRN: [id number] Attending MD: Sandor Flatter , MD, [id number] Date of Birth: 09-11-1941 CSN: [id number] Age: 83 Admit Type: Outpatient Procedure:                Upper GI endoscopy Indications:              Iron  deficiency anemia, Suspected esophageal                            reflux, Heme positive stool Providers:                Sandor Flatter, MD, Almarie Pizza, RN, [facility]                            Pet[place]ord, Technician, Cottie Lobe, CRNA Referring MD:              Medicines:                Monitored Anesthesia Care Complications:            No immediate complications. Estimated Blood Loss:     Estimated blood loss was minimal. Procedure:                Pre-Anesthesia Assessment:                           - Prior to the procedure, a History and Physical                            was performed, and patient medications and                            allergies were reviewed. The patient's tolerance of                            previous anesthesia was also reviewed. The risks                            and benefits of the procedure and the sedation                            options and risks were discussed with the patient.                            All questions were answered, and informed consent                            was obtained. Prior Anticoagulants: The patient has                            taken no anticoagulant or antiplatelet agents                            except for aspirin . ASA Grade Assessment: III - A  patient with severe systemic disease. After                            reviewing the risks and benefits, the patient was                            deemed in satisfactory condition to undergo the                            procedure.                           After obtaining informed consent, the endoscope was                            passed  under direct vision. Throughout the                            procedure, the patient's blood pressure, pulse, and                            oxygen  saturations were monitored continuously. The                            [id number]-[id number] ([id number] Olympus endoscope was introduced                            through the mouth, and advanced to the third part                            of duodenum. The upper GI endoscopy was                            accomplished without difficulty. The patient                            tolerated the procedure well. Scope In: Scope Out: Findings:      The examined esophagus was normal.      A single small angioectasia with no bleeding was found in the gastric       body. Coagulation for hemostasis using argon plasma was successful.       Estimated blood loss was minimal.      Normal mucosa was found in the entire examined stomach. Biopsies were       taken with a cold forceps for histology and Helicobacter pylori testing.       Estimated blood loss was minimal.      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 Coagulation for hemostasis using argon plasma       was successful. Estimated blood loss was minimal.      One non-bleeding cratered duodenal ulcer with no stigmata of bleeding       was found in the duodenal bulb. The lesion was 4 mm  in largest       dimension. Biopsies were taken with a cold forceps for histology.       Estimated blood loss was minimal. Impression:               - Normal esophagus.                           - A single non-bleeding angioectasia in the                            stomach. Treated with argon plasma coagulation                            (APC).                           - Normal mucosa was found in the entire stomach.                            Biopsied.                           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 Treated with                            argon plasma coagulation (APC).                           - Non-bleeding duodenal ulcer with no stigmata of                            bleeding. Biopsied. Recommendation:           - Patient has a contact number available for                            emergencies. The signs and symptoms of potential                            delayed complications were discussed with the                            patient. Return to normal activities tomorrow.                            Written discharge instructions were provided to the                            patient.                           - Advance diet as tolerated.                           - Continue present medications.                           -  Await pathology results.                           - Repeat upper endoscopy PRN for retreatment.                           - Repeat CBC and iron  panel in 2-3 months.                           - If ongoing iron  deficiency anemia, plan for Video                            Capsule Endoscopy for further small bowel                            interrogation and evaluate for additional AVMs in                            the mid and distal small bowel.                           - Colonoscopy today. Procedure Code(s):        --- Professional ---                           [phone], 59, Esophagogastroduodenoscopy, flexible,                            transoral; with control of bleeding, any method                           43239, Esophagogastroduodenoscopy, flexible,                            transoral; with biopsy, single or multiple Diagnosis Code(s):        --- Professional ---                           K31.819, Angiodysplasia of stomach and duodenum                            without bleeding                           K26.9, Duodenal ulcer, unspecified as acute or                            chronic, without hemorrhage or perforation                            D50.9, Iron  deficiency anemia, unspecified                           R19.5, Other fecal abnormalities CPT copyright 2022 [employer]. All rights reserved. The codes documented in this report are preliminary and upon coder review may  be revised to meet  current compliance requirements. Sandor Flatter, MD 06/17/2024 9:40:21 AM Number of Addenda: 0

## 2024-06-17 NOTE — Anesthesia Postprocedure Evaluation (Signed)
 Anesthesia Post Note  Patient: Tony Mcmillan  Procedure(s) Performed: COLONOSCOPY EGD (ESOPHAGOGASTRODUODENOSCOPY)     Patient location during evaluation: PACU Anesthesia Type: MAC Level of consciousness: awake and alert Pain management: pain level controlled Vital Signs Assessment: post-procedure vital signs reviewed and stable Respiratory status: spontaneous breathing, nonlabored ventilation and respiratory function stable Cardiovascular status: blood pressure returned to baseline and stable Postop Assessment: no apparent nausea or vomiting Anesthetic complications: no   There were no known notable events for this encounter.  Last Vitals:  Vitals:   06/17/24 0930 06/17/24 0940  BP: 126/71 117/64  Pulse: 96 76  Resp: (!) 24 15  Temp:    SpO2: 97% 95%    Last Pain:  Vitals:   06/17/24 0940  TempSrc:   PainSc: 0-No pain                 Butler Levander Pinal

## 2024-06-17 NOTE — Op Note (Signed)
 [facility] Patient Name: Tony Mcmillan Procedure Date : 06/17/2024 MRN: [id number] Attending MD: Sandor Flatter , MD, [id number] Date of Birth: Jan 04, 1941 CSN: [id number] Age: 83 Admit Type: Outpatient Procedure:                Colonoscopy Indications:              Heme positive stool, Iron  deficiency anemia Providers:                Sandor Flatter, MD, Almarie Pizza, RN, [facility]                            Petiford, Technician, Cottie Lobe, CRNA Referring MD:              Medicines:                Monitored Anesthesia Care Complications:            No immediate complications. Estimated Blood Loss:     Estimated blood loss was minimal. Procedure:                Pre-Anesthesia Assessment:                           - Prior to the procedure, a History and Physical                            was performed, and patient medications and                            allergies were reviewed. The patient's tolerance of                            previous anesthesia was also reviewed. The risks                            and benefits of the procedure and the sedation                            options and risks were discussed with the patient.                            All questions were answered, and informed consent                            was obtained. Prior Anticoagulants: The patient has                            taken no anticoagulant or antiplatelet agents                            except for aspirin . ASA Grade Assessment: III - A                            patient with severe systemic disease. After  reviewing the risks and benefits, the patient was                            deemed in satisfactory condition to undergo the                            procedure.                           After obtaining informed consent, the colonoscope                            was passed under direct vision. Throughout the                             procedure, the patient's blood pressure, pulse, and                            oxygen  saturations were monitored continuously. The                            [id number]-[id number] ([id number] Olympus peds colonoscope was                            introduced through the anus and advanced to the the                            cecum, identified by appendiceal orifice and                            ileocecal valve. The colonoscopy was performed                            without difficulty. The patient tolerated the                            procedure well. The quality of the bowel                            preparation was good. The ileocecal valve,                            appendiceal orifice, and rectum were photographed. Scope In: 9:02:51 AM Scope Out: 9:19:18 AM Scope Withdrawal Time: 0 hours 14 minutes 40 seconds  Total Procedure Duration: 0 hours 16 minutes 27 seconds  Findings:      The perianal exam was abnormal, characterized by scarring from prior       surgery and apparent skin grafting..      A single medium-sized angioectasia with typical arborization was found       in the cecum. Coagulation for hemostasis using argon plasma was       successful. Estimated blood loss was minimal.      Multiple small-mouthed diverticula were found in the entire colon.      There was evidence of a prior end-to-end colo-colonic anastomosis in the  sigmoid colon. This was patent and was characterized by healthy       appearing mucosa. The anastomosis was traversed.      The retroflexed view of the distal rectum and anal verge was normal and       showed no anal or rectal abnormalities. Impression:               - Abnormal perianal exam, characterized by scarring                            from prior surgery and apparent skin grafting.                           - A single colonic angioectasia. Treated with argon                            plasma coagulation (APC).                           -  Diverticulosis in the entire examined colon.                           - Patent end-to-end colo-colonic anastomosis,                            characterized by healthy appearing mucosa.                           - The distal rectum and anal verge are normal on                            retroflexion view.                           - No specimens collected. Recommendation:           - Patient has a contact number available for                            emergencies. The signs and symptoms of potential                            delayed complications were discussed with the                            patient. Return to normal activities tomorrow.                            Written discharge instructions were provided to the                            patient.                           - Resume previous diet.                           -  Continue present medications.                           - Repeat colonoscopy is not recommended due to                            current age (37 years or older) for screening                            purposes. Procedure Code(s):        --- Professional ---                           [phone], Colonoscopy, flexible; with control of                            bleeding, any method Diagnosis Code(s):        --- Professional ---                           K55.20, Angiodysplasia of colon without hemorrhage                           Z98.0, Intestinal bypass and anastomosis status                           R19.5, Other fecal abnormalities                           D50.9, Iron  deficiency anemia, unspecified                           K57.30, Diverticulosis of large intestine without                            perforation or abscess without bleeding CPT copyright 2022 [employer]. All rights reserved. The codes documented in this report are preliminary and upon coder review may  be revised to meet current compliance requirements. Sandor Flatter,  MD 06/17/2024 9:44:42 AM Number of Addenda: 0

## 2024-06-18 LAB — SURGICAL PATHOLOGY

## 2024-06-19 ENCOUNTER — Encounter (HOSPITAL_COMMUNITY): Payer: Self-pay | Admitting: Gastroenterology

## 2024-06-22 ENCOUNTER — Ambulatory Visit: Payer: Self-pay | Admitting: Gastroenterology

## 2024-06-22 NOTE — Telephone Encounter (Signed)
 Patient called into the office to review and clarify pathology results. I reviewed information with patient and answered all of his questions. Patient verbalized understanding and had no concerns at the end of the call.

## 2024-06-24 ENCOUNTER — Other Ambulatory Visit: Payer: Self-pay | Admitting: Physician Assistant

## 2024-06-24 ENCOUNTER — Other Ambulatory Visit: Payer: Self-pay | Admitting: Surgical

## 2024-06-28 ENCOUNTER — Inpatient Hospital Stay: Attending: Oncology

## 2024-06-28 DIAGNOSIS — D509 Iron deficiency anemia, unspecified: Secondary | ICD-10-CM | POA: Insufficient documentation

## 2024-06-28 DIAGNOSIS — D649 Anemia, unspecified: Secondary | ICD-10-CM

## 2024-06-28 LAB — CBC WITH DIFFERENTIAL (CANCER CENTER ONLY)
Abs Immature Granulocytes: 0.01 K/uL (ref 0.00–0.07)
Basophils Absolute: 0 K/uL (ref 0.0–0.1)
Basophils Relative: 1 %
Eosinophils Absolute: 0.1 K/uL (ref 0.0–0.5)
Eosinophils Relative: 2 %
HCT: 32.5 % — ABNORMAL LOW (ref 39.0–52.0)
Hemoglobin: 10.2 g/dL — ABNORMAL LOW (ref 13.0–17.0)
Immature Granulocytes: 0 %
Lymphocytes Relative: 15 %
Lymphs Abs: 0.7 K/uL (ref 0.7–4.0)
MCH: 26.8 pg (ref 26.0–34.0)
MCHC: 31.4 g/dL (ref 30.0–36.0)
MCV: 85.3 fL (ref 80.0–100.0)
Monocytes Absolute: 0.6 K/uL (ref 0.1–1.0)
Monocytes Relative: 15 %
Neutro Abs: 3 K/uL (ref 1.7–7.7)
Neutrophils Relative %: 67 %
Platelet Count: 316 K/uL (ref 150–400)
RBC: 3.81 MIL/uL — ABNORMAL LOW (ref 4.22–5.81)
RDW: 16.1 % — ABNORMAL HIGH (ref 11.5–15.5)
WBC Count: 4.4 K/uL (ref 4.0–10.5)
nRBC: 0 % (ref 0.0–0.2)

## 2024-06-28 LAB — FERRITIN: Ferritin: 99 ng/mL (ref 24–336)

## 2024-06-30 ENCOUNTER — Other Ambulatory Visit: Payer: Self-pay | Admitting: Cardiovascular Disease

## 2024-07-05 ENCOUNTER — Other Ambulatory Visit: Payer: Self-pay

## 2024-07-05 ENCOUNTER — Ambulatory Visit: Admitting: Orthopedic Surgery

## 2024-07-05 ENCOUNTER — Encounter: Payer: Self-pay | Admitting: Orthopedic Surgery

## 2024-07-05 DIAGNOSIS — M19012 Primary osteoarthritis, left shoulder: Secondary | ICD-10-CM

## 2024-07-05 NOTE — Progress Notes (Unsigned)
 Office Visit Note   Patient: Tony Mcmillan           Date of Birth: 1941/06/12           MRN: [id number] Visit Date: 07/05/2024 Requested by: Oris Camie BRAVO, NP [address] [place],  [place] [address] PCP: Oris Camie BRAVO, NP  Subjective: Chief Complaint  Patient presents with   Left Shoulder - Pain    HPI: Tony Mcmillan is a 83 y.o. male who presents to the office reporting left shoulder pain.  Since he was last seen has had a CT scan.  This does show severe glenohumeral joint arthritis with good bone stock for reverse replacement.  Last clinic visit he was somewhat adamant about getting the shoulder replaced.  He does have multiple medical problems..                ROS: All systems reviewed are negative as they relate to the chief complaint within the history of present illness.  Patient denies fevers or chills.  Assessment & Plan: Visit Diagnoses:  1. 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  He has changed his mind some and would like to try an injection as opposed to surgical replacement.  Glenohumeral joint injection performed today.  Will see how long that lasts.  If he changes his mind about surgery we would need to obtain medical and cardiac risk stratification.  We will hold off on replacement for now.  Follow-Up Instructions: No follow-ups on file.   Orders:  Orders Placed This Encounter  Procedures   US  Guided Needle Placement - No Linked Charges   No orders of the defined types were placed in this encounter.     Procedures: Large Joint Inj: L glenohumeral on 07/05/2024 2:33 PM Indications: diagnostic evaluation and pain Details: 22 G 3.5 in needle, ultrasound-guided posterior approach  Arthrogram: No  Medications: 9 mL bupivacaine  0.5 %; 40 mg methylPREDNISolone  acetate 40 MG/ML; 5 mL lidocaine  1 % Outcome: tolerated well, no immediate complications Procedure, treatment alternatives, risks and  benefits explained, specific risks discussed. Consent was given by the patient. Immediately prior to procedure a time out was called to verify the correct patient, procedure, equipment, support staff and site/side marked as required. Patient was prepped and draped in the usual sterile fashion.       Clinical Data: No additional findings.  Objective: Vital Signs: There were no vitals taken for this visit.  Physical Exam:  Constitutional: Patient appears well-developed HEENT:  Head: Normocephalic Eyes:EOM are normal Neck: Normal range of motion Cardiovascular: Normal rate Pulmonary/chest: Effort normal Neurologic: Patient is alert Skin: Skin is warm Psychiatric: Patient has normal mood and affect  Ortho Exam: Ortho exam demonstrates functional deltoid.  Does have restricted range of motion of 10/50/85.  Motor or sensory function to the hand is intact on the left-hand side.  Radial pulses intact.  Specialty Comments:  Narrative & Impression [facility]   NEUROIMAGING REPORT     STUDY DATE: 07/01/23  MR CERVICAL SPINE WO CONTRAST     COMPARISON: 03/22/23 CT    FINDINGS:    On sagittal views the vertebral bodies have normal height and alignment.  Anterior cervical discectomy and fusion with metal hardware (anteriorly and posteriorly) spanning C2-C6 levels with interbody fusion device spanning C3-C5 vertebral bodies. 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  The posterior fossa, pituitary gland  and paraspinal soft tissues are unremarkable.    On axial views: C2-3 no spinal stenosis or foraminal narrowing C3-4 no spinal stenosis or foraminal narrowing C4-5 no spinal stenosis or foraminal narrowing  C5-6 no spinal stenosis or foraminal narrowing C6-7 no spinal stenosis or foraminal narrowing C7-T1 disc bulging and uncovertebral joint hypertrophy with severe bilateral foraminal stenosis T1-2 disc bulging and facet hypertrophy  with mild bilateral foraminal stenosis T2-3 no spinal stenosis or foraminal narrowing   Limited views of the soft tissues of the head and neck are unremarkable.     IMPRESSION:    MRI cervical spine (without) demonstrating: - Cervical discectomy and fusion spanning C2-C6 with anterior and posterior hardware and interbody fusion device from C3-C5 levels. - At C7-T1 disc bulging and uncovertebral joint hypertrophy with severe bilateral foraminal stenosis. - At T1-2 disc bulging and facet hypertrophy with mild bilateral foraminal stenosis.       INTERPRETING PHYSICIAN:  EDUARD FABIENE HANLON, MD Certified in Neurology, Neurophysiology and Neuroimaging   [facility] [address] [place], [place] [address] [phone]  ----- EMG/NCS 2024 Conclusion:  1. 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  2. 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 3.  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  Imaging: US  Guided Needle Placement - No Linked Charges Result Date: 07/05/2024 Ultrasound imaging demonstrates needle placement into the glenohumeral joint with injection of fluid into the joint and no complicating features. Left shoulder.    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   AVM (arteriovenous malformation)  of small bowel, acquired 06/17/2024   Precordial chest pain 01/04/2024   Carpal tunnel syndrome, left upper limb 09/15/2023   Cubital tunnel syndrome on left 09/15/2023   Iron  deficiency anemia due to chronic blood loss 06/02/2023   Occipital neuralgia of left side 03/26/2023   COPD (chronic obstructive pulmonary disease) (HCC) 08/08/2022   Olecranon bursitis of right elbow 05/24/2022   Other long term (current) drug therapy 05/09/2022   Pulmonary fibrosis (HCC) 05/09/2022   Absolute anemia 05/09/2022   Mixed hyperlipidemia 01/21/2022   Premature atrial contractions 01/20/2022   CAD (coronary artery disease)    Enlarged prostate 03/10/2020   Aortic valve calcification 03/10/2020   Bladder wall thickening 03/10/2020   Weight loss 11/15/2019   Bilateral carotid bruits 02/09/2019   History of systemic steroid therapy 10/30/2017   Pulmonary emphysema (HCC) 12/18/2016   Rheumatoid arthritis (HCC) 11/04/2016   High risk medication use 11/04/2016   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   Vitamin D  deficiency 11/04/2016   History of colonic polyps 07/02/2016   Asymptomatic microscopic hematuria 04/03/2016   Benign prostatic hyperplasia 04/03/2016   Renal cyst, left 04/03/2016   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 [facility].     Atypical chest pain    a. 09/2016 MV: EF 63%, normal perfusion.   Benign prostatic hyperplasia 04/03/2016   Asymptomatic. Normal PSA and exam by Dr. [facility] 01/2016.     Bilateral carotid bruits 02/09/2019   Bladder wall thickening 03/10/2020   CAD (coronary artery disease)    Coronary artery  calcification on Chest CT in 09/2016 and 01/2019 // Myoview  10/17: normal perfusion // Myoview  01/2019: no ischemia or infarction, EF 58 // Coronary CTA 03/2020: Calcium  Score 1067; non-obstructive CAD [oLM 1-24, LAD ost 25049, mid and dist 1-24; D1 1-24; LCx prox and mid 1-24, OM1 and OM2 25-49, RCA and PLB 1-24; PLA 1-24]    Carotid bruit    Carotid Dopplers 01/2019: no ICA stenosis bilaterally    Chronic wrist pain 07/02/2016   Colonic polyp 02/23/2016   COVID-19    DDD (degenerative disc disease), 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 L4-5 and L5-S1    Diastolic dysfunction    a. 2015 Echo: EF >55%;  b. 09/2016 Echo: EF 55-60%, no rwma, Gr1 DD, triv AI/MR, mild TR, PASP .   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   On CT 01/2016    Palpitations    Echo 10/17: Mild focal basal septal hypertrophy, EF 55-60, no RWMA, Gr 1 DD, trivial MR, normal RVSF, mild TR, PASP 28 // Event monitor 01/2019: NSR, PACs   Prediabetes 10/29/2018   Pulmonary emphysema (HCC) 12/18/2016   Renal cyst, left 04/03/2016   On CT, benign    Rheumatoid arthritis (HCC)    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   -RF,-CCP,-1433eta, erosive disease with contractures   Tubular adenoma of colon 01/2016   Vitamin D  deficiency 11/04/2016    Family History  Problem Relation Age of Onset   Hypertension Mother    Hypertension Father    Diabetes Father    Heart disease Father        stents   Hyperlipidemia Sister    Diabetes Sister     Stroke Brother    Diabetes Brother    Heart disease Brother        stents   Colon polyps Neg Hx    Esophageal cancer Neg Hx    Stomach cancer Neg Hx    Rectal cancer Neg Hx    Colon cancer Neg Hx     Past Surgical History:  Procedure Laterality Date   ANTERIOR CERVICAL CORPECTOMY     ANTERIOR CERVICAL CORPECTOMY  12/2014   for infection. this was in [place]   CARPAL TUNNEL RELEASE Left 09/29/2023   CARPAL TUNNEL RELEASE Left    CATARACT EXTRACTION     both eyes   COLONOSCOPY     COLONOSCOPY N/A 06/17/2024   Procedure: COLONOSCOPY;  Surgeon: San Sandor GAILS, DO;  Location: [facility] ENDOSCOPY;  Service: Gastroenterology;  Laterality: N/A;  COLOSTOMY REVERSAL     had infection on buttocks and had to have a skin graft- had colostomy to help area stay clean and heal   ESOPHAGOGASTRODUODENOSCOPY N/A 06/17/2024   Procedure: EGD (ESOPHAGOGASTRODUODENOSCOPY);  Surgeon: San Sandor GAILS, DO;  Location[facility] ENDOSCOPY;  Service: Gastroenterology;  Laterality: N/A;   HERNIA REPAIR     POLYPECTOMY     SPINE SURGERY     Social History   Occupational History   Not on file  Tobacco Use   Smoking status: Former    Current packs/day: 0.00    Average packs/day: 1 pack/day for 17.0 years (17.0 ttl pk-yrs)    Types: Cigarettes    Start date: 12/23/1976    Quit date: 12/23/1993    Years since quitting: 30.5   Smokeless tobacco: Never  Vaping Use   Vaping status: Never Used  Substance and Sexual Activity   Alcohol use: No    Alcohol/week: 0.0 standard drinks of alcohol   Drug use: No   Sexual activity: Yes

## 2024-07-06 ENCOUNTER — Telehealth: Payer: Self-pay

## 2024-07-06 MED ORDER — BUPIVACAINE HCL 0.5 % IJ SOLN
9.0000 mL | INTRAMUSCULAR | Status: AC | PRN
Start: 2024-07-05 — End: 2024-07-05
  Administered 2024-07-05: 9 mL via INTRA_ARTICULAR

## 2024-07-06 MED ORDER — LIDOCAINE HCL 1 % IJ SOLN
5.0000 mL | INTRAMUSCULAR | Status: AC | PRN
  Administered 2024-07-05: 5 mL

## 2024-07-06 MED ORDER — METHYLPREDNISOLONE ACETATE 40 MG/ML IJ SUSP
40.0000 mg | INTRAMUSCULAR | Status: AC | PRN
  Administered 2024-07-05: 40 mg via INTRA_ARTICULAR

## 2024-07-06 NOTE — Telephone Encounter (Signed)
-----   Message from Olam Ned sent at 07/02/2024  8:42 AM EDT ----- Please let him know hemoglobin and ferritin are better.  Follow-up as scheduled.

## 2024-07-06 NOTE — Telephone Encounter (Signed)
 Patient gave verbal understanding and had no further questions or concerns at this time

## 2024-07-19 ENCOUNTER — Ambulatory Visit: Payer: Self-pay

## 2024-07-19 ENCOUNTER — Emergency Department (HOSPITAL_BASED_OUTPATIENT_CLINIC_OR_DEPARTMENT_OTHER)
Admission: EM | Admit: 2024-07-19 | Discharge: 2024-07-19 | Disposition: A | Discharge status: Home / Self Care | Source: Ambulatory Visit

## 2024-07-19 ENCOUNTER — Emergency Department (HOSPITAL_BASED_OUTPATIENT_CLINIC_OR_DEPARTMENT_OTHER): Admitting: Radiology

## 2024-07-19 ENCOUNTER — Other Ambulatory Visit: Payer: Self-pay

## 2024-07-19 ENCOUNTER — Telehealth: Payer: Self-pay

## 2024-07-19 ENCOUNTER — Emergency Department (HOSPITAL_BASED_OUTPATIENT_CLINIC_OR_DEPARTMENT_OTHER)

## 2024-07-19 DIAGNOSIS — R072 Precordial pain: Secondary | ICD-10-CM | POA: Insufficient documentation

## 2024-07-19 DIAGNOSIS — R053 Chronic cough: Secondary | ICD-10-CM | POA: Diagnosis not present

## 2024-07-19 DIAGNOSIS — Z7982 Long term (current) use of aspirin: Secondary | ICD-10-CM | POA: Insufficient documentation

## 2024-07-19 DIAGNOSIS — R079 Chest pain, unspecified: Secondary | ICD-10-CM | POA: Diagnosis present

## 2024-07-19 DIAGNOSIS — J449 Chronic obstructive pulmonary disease, unspecified: Secondary | ICD-10-CM | POA: Insufficient documentation

## 2024-07-19 LAB — BASIC METABOLIC PANEL WITH GFR
Anion gap: 10 (ref 5–15)
BUN: 14 mg/dL (ref 8–23)
CO2: 24 mmol/L (ref 22–32)
Calcium: 9.3 mg/dL (ref 8.9–10.3)
Chloride: 103 mmol/L (ref 98–111)
Creatinine, Ser: 1.04 mg/dL (ref 0.61–1.24)
GFR, Estimated: >60 mL/min (ref >60)
Glucose, Bld: 101 mg/dL — ABNORMAL HIGH (ref 70–99)
Potassium: 4.3 mmol/L (ref 3.5–5.1)
Sodium: 138 mmol/L (ref 135–145)

## 2024-07-19 LAB — HEPATIC FUNCTION PANEL
ALT: 15 U/L (ref 0–44)
AST: 22 U/L (ref 15–41)
Albumin: 4.1 g/dL (ref 3.5–5.0)
Alkaline Phosphatase: 65 U/L (ref 38–126)
Bilirubin, Direct: 0.1 mg/dL (ref 0.0–0.2)
Indirect Bilirubin: 0.1 mg/dL — ABNORMAL LOW (ref 0.3–0.9)
Total Bilirubin: 0.2 mg/dL (ref 0.0–1.2)
Total Protein: 7 g/dL (ref 6.5–8.1)

## 2024-07-19 LAB — CBC
HCT: 29.5 % — ABNORMAL LOW (ref 39.0–52.0)
Hemoglobin: 9.4 g/dL — ABNORMAL LOW (ref 13.0–17.0)
MCH: 26.3 pg (ref 26.0–34.0)
MCHC: 31.9 g/dL (ref 30.0–36.0)
MCV: 82.4 fL (ref 80.0–100.0)
Platelets: 324 K/uL (ref 150–400)
RBC: 3.58 MIL/uL — ABNORMAL LOW (ref 4.22–5.81)
RDW: 15.9 % — ABNORMAL HIGH (ref 11.5–15.5)
WBC: 5.1 K/uL (ref 4.0–10.5)
nRBC: 0 % (ref 0.0–0.2)

## 2024-07-19 LAB — LIPASE, BLOOD: Lipase: 27 U/L (ref 11–51)

## 2024-07-19 LAB — RESP PANEL BY RT-PCR (RSV, FLU A&B, COVID)  RVPGX2
Influenza A by PCR: NEGATIVE
Influenza B by PCR: NEGATIVE
Resp Syncytial Virus by PCR: NEGATIVE
SARS Coronavirus 2 by RT PCR: NEGATIVE

## 2024-07-19 LAB — TROPONIN T, HIGH SENSITIVITY
Troponin T High Sensitivity: 47 ng/L — ABNORMAL HIGH (ref <19)
Troponin T High Sensitivity: 43 ng/L — ABNORMAL HIGH (ref <19)

## 2024-07-19 LAB — D-DIMER, QUANTITATIVE: D-Dimer, Quant: <0.27 ug{FEU}/mL (ref 0.00–0.50)

## 2024-07-19 MED ORDER — MAALOX MAX 400-400-40 MG/5ML PO SUSP: 5.0000 mL | Freq: Four times a day (QID) | ORAL | 0 refills | Status: DC | PRN

## 2024-07-19 MED ORDER — LIDOCAINE VISCOUS HCL 2 % MT SOLN
15.0000 mL | Freq: Once | OROMUCOSAL | Status: AC
  Administered 2024-07-19: 15 mL via ORAL
  Filled 2024-07-19: qty 15

## 2024-07-19 MED ORDER — LIDOCAINE VISCOUS HCL 2 % MT SOLN: 15.0000 mL | OROMUCOSAL | 0 refills | Status: DC | PRN

## 2024-07-19 MED ORDER — ALUM & MAG HYDROXIDE-SIMETH 200-200-20 MG/5ML PO SUSP
30.0000 mL | Freq: Once | ORAL | Status: AC
  Administered 2024-07-19: 30 mL via ORAL
  Filled 2024-07-19: qty 30

## 2024-07-19 NOTE — Telephone Encounter (Signed)
-----   Message from Scot Ford sent at 07/19/2024  5:08 PM EDT ----- Regarding: schedule follow up Patient of Glendia Ferrier who was seen in the ED. Please contact the patient and schedule a post ED follow up with Glendia Ferrier in the next few weeks  Thanks Hao

## 2024-07-19 NOTE — ED Triage Notes (Addendum)
 Pt c/o right side chest pain & tightness for past 2-3 weeks.  Called his PMD today and advised to come to ED for eval.  Pt describes pain as constant, unchanged with mov't or positioning and states that it has not changed over the course of the past couple weeks.  Pt denies n/v/SOB/Dizziness or other symptoms.  Pt h/o lung scarring on right side.

## 2024-07-19 NOTE — Telephone Encounter (Signed)
 FYI Only or Action Required?: FYI only for provider.  Patient is followed in Pulmonology for COPD, last seen on 03/19/2024 by Parrett, Tony RAMAN, NP.  Called Nurse Triage reporting Chest Pain.  Symptoms began 2 weeks ago.  Interventions attempted: Increased fluids/rest.  Symptoms are: unchanged.reports burning sensation with continual chest pain.   Triage Disposition: Go to ED Now (or PCP Triage)  Patient/caregiver understands and will follow disposition?: Yes  Copied from CRM #[id number]. Topic: Clinical - Red Word Triage >> Jul 19, 2024  8:43 AM Russell PARAS wrote: Red Word that prompted transfer to Nurse Triage:   Symptoms for 2 weeks Chest pain on the right side Mild chest tightness Burning sensation  Symptoms are constant.  No wheezing  Sees Tammy Parrett Reason for Disposition  [1] Chest pain lasts > 5 minutes AND [2] occurred in past 3 days (72 hours) (Exception: Feels exactly the same as previously diagnosed heartburn and has accompanying sour taste in mouth.)  Answer Assessment - Initial Assessment Questions 1. LOCATION: Where does it hurt?       Right sided chest pain 2. RADIATION: Does the pain go anywhere else? (e.g., into neck, jaw, arms, back)     No radiation 3. ONSET: When did the chest pain begin? (Minutes, hours or days)      2 weeks ago 4. PATTERN: Does the pain come and go, or has it been constant since it started?  Does it get worse with exertion?      constant 5. DURATION: How long does it last (e.g., seconds, minutes, hours)     Pain has remained for two weeks 6. SEVERITY: How bad is the pain?  (e.g., Scale 1-10; mild, moderate, or severe)     5 out of 10 7. CARDIAC RISK FACTORS: Do you have any history of heart problems or risk factors for heart disease? (e.g., angina, prior heart attack; diabetes, high blood pressure, high cholesterol, smoker, or strong family history of heart disease)     no 8. PULMONARY RISK FACTORS: Do you have any  history of lung disease?  (e.g., blood clots in lung, asthma, emphysema, birth control pills)     COPD 9. CAUSE: What do you think is causing the chest pain?     Patient thinks it is his lung 10. OTHER SYMPTOMS: Do you have any other symptoms? (e.g., dizziness, nausea, vomiting, sweating, fever, difficulty breathing, cough)       Cough  Patient reports a burning sensation to his chest.  Protocols used: Chest Pain-A-AH

## 2024-07-19 NOTE — Discharge Instructions (Signed)
 You have Imdur  prescriptions at [facility]. [facility].  You may pick this up.  Your cardiologist had called this in.  Make sure to follow-up with cardiology outpatient  Return for any worsening symptoms

## 2024-07-19 NOTE — Telephone Encounter (Signed)
 Patient scheduled for appointment with Glendia Ferrier on Tuesday August 19th, 2025 at 2:45 pm per Hao Meng PA-C. Patient currently still admitted. Triage will call to make patient aware.

## 2024-07-19 NOTE — Telephone Encounter (Signed)
 Pt was admitted. NFN

## 2024-07-19 NOTE — ED Provider Notes (Signed)
 [place] EMERGENCY DEPARTMENT AT [facility] Provider Note   CSN: [id number] Arrival date & time: 07/19/24  1328     Patient presents with: Chest Pain   Henrik Orihuela is a 83 y.o. male here for evaluation of chest pain.  Ongoing over the last 3 weeks.  Nonexertional, nonpleuritic in nature.  Pain to central and right chest.  He denies any PND orthopnea.  No pain or swelling to lower legs.  Pain not worse with position or p.o. intake.  Describes it as a burning sensation.  He has an intermittent chronic cough which is unchanged.  No hemoptysis.  No wheezing.  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  He is also followed with the [facility] cardiology group.  He tried taking Imdur  once however did not help so he has not taken this again.  He is able to do his normal ADLs without any exertional chest pain or dyspnea on exertion.  He denies any PND orthopnea.  No history of PE, DVT, recent surgery, immobilization or malignancy however per chart review on 06/17/2024 he did have EGD and colonoscopy for persistent GERD, history of AVMs.  He denies any abdominal pain, emesis, melanotic or bright red blood in his stool.  States he has some chronic constipation which is unchanged.  No fever, back pain, sore throat.  He has occasional runny nose.  No known sick contacts.  No shortness of breath, dizziness, syncope.  Pain does not radiate to left arm, jaw or back.  No associated diaphoresis, nausea or vomiting   HPI     Prior to Admission medications   Medication Sig Start Date End Date Taking? Authorizing Provider  alum & mag hydroxide-simeth (MAALOX MAX) 400-400-40 MG/5ML suspension Take 5 mLs by mouth every 6 (six) hours as needed for indigestion. 07/19/24  Yes Calissa Swenor A, PA-C  lidocaine  (XYLOCAINE ) 2 % solution Use as directed 15 mLs in the mouth or throat as needed for mouth pain. 07/19/24  Yes Noor Vidales A, PA-C  albuterol  (VENTOLIN  HFA) 108 (90 Base) MCG/ACT inhaler Inhale 2  puffs into the lungs every 6 (six) hours as needed for wheezing or shortness of breath. 03/19/24   Parrett, Madelin RAMAN, NP  aspirin  EC (ASPIRIN  LOW DOSE) 81 MG tablet TAKE 1 TABLET BY MOUTH DAILY *SWALLOW WHOLE* 02/05/24   Lelon Hamilton T, PA-C  atorvastatin  (LIPITOR) 40 MG tablet Take 1 tablet (40 mg total) by mouth daily. 01/28/24   Lelon Hamilton T, PA-C  Calcium  Carb-Cholecalciferol (CALCIUM -VITAMIN D3) 600-400 MG-UNIT TABS Take 1 tablet by mouth 2 (two) times daily.    [provider]  docusate sodium (COLACE) 250 MG capsule Take 250 mg by mouth daily.    [provider]  gabapentin  (NEURONTIN ) 100 MG capsule TAKE 2 CAPSULES BY MOUTH AT BEDTIME. 06/24/24   Persons, Ronal Dragon, PA  Glycopyrrolate -Formoterol  (BEVESPI  AEROSPHERE) 9-4.8 MCG/ACT AERO Inhale 2 puffs into the lungs in the morning and at bedtime. 03/19/24   Parrett, Madelin RAMAN, NP  isosorbide  mononitrate (IMDUR ) 30 MG 24 hr tablet Take 1 tablet (30 mg total) by mouth daily. 06/24/24   Lelon Hamilton T, PA-C  latanoprost (XALATAN) 0.005 % ophthalmic solution Place 1 drop into both eyes at bedtime. 11/14/23   [provider]  leflunomide (ARAVA) 20 MG tablet Take 20 mg by mouth daily.    [provider]  metoprolol  succinate (TOPROL -XL) 25 MG 24 hr tablet Take 1 tablet (25 mg total) by mouth daily. Patient not taking: Reported  on 05/13/2024 12/30/22   Hobart Powell BRAVO, MD  MYRBETRIQ 50 MG TB24 tablet Take 50 mg by mouth daily.  02/07/19   [provider]  Na Sulfate-K Sulfate-Mg Sulfate concentrate (SUPREP) 17.5-3.13-1.6 GM/177ML SOLN Use as directed; may use generic; goodrx card if insurance will not cover generic 05/13/24   Craig Alan SAUNDERS, PA-C  nitroGLYCERIN  (NITROSTAT ) 0.4 MG SL tablet PLACE 1 TABLET UNDER THE TONGUE EVERY 5 MINUTES AS NEEDED FOR CHEST PAIN. 07/01/24   Verlin Lonni BIRCH, MD  pantoprazole  (PROTONIX ) 40 MG tablet Take 1 tablet (40 mg total) by mouth daily. 05/13/24   Craig Alan SAUNDERS,  PA-C  polyethylene glycol (MIRALAX ) 17 g packet Take 17 g by mouth daily as needed. 05/27/22   Debby Olam POUR, NP  REMICADE 100 MG injection  03/05/23   [provider]    Allergies: Patient has no known allergies.    Review of Systems  Constitutional: Negative.   HENT: Negative.    Respiratory: Negative.    Cardiovascular:  Positive for chest pain. Negative for palpitations and leg swelling.  Gastrointestinal: Negative.   Genitourinary: Negative.   Musculoskeletal: Negative.   Skin: Negative.   Neurological: Negative.   All other systems reviewed and are negative.   Updated Vital Signs BP 117/69   Pulse 67   Temp (!) 97.4 F (36.3 C) (Oral)   Resp 16   Ht 5' 2 (1.575 m)   Wt 59 kg   SpO2 96%   BMI 23.78 kg/m   Physical Exam Vitals and nursing note reviewed.  Constitutional:      General: He is not in acute distress.    Appearance: He is well-developed. He is not ill-appearing, toxic-appearing or diaphoretic.  HENT:     Head: Atraumatic.  Eyes:     Pupils: Pupils are equal, round, and reactive to light.  Neck:     Trachea: Trachea and phonation normal.  Cardiovascular:     Rate and Rhythm: Normal rate and regular rhythm.     Pulses: Normal pulses.          Dorsalis pedis pulses are 2+ on the right side and 2+ on the left side.       Posterior tibial pulses are 2+ on the right side and 2+ on the left side.     Heart sounds: Normal heart sounds.  Pulmonary:     Effort: Pulmonary effort is normal. No respiratory distress.     Breath sounds: Normal breath sounds and air entry.     Comments: Clear bilaterally, speaks in full sentences without difficulty Chest:     Comments: Nontender chest wall Abdominal:     General: Bowel sounds are normal. There is no distension.     Palpations: Abdomen is soft.     Tenderness: There is no abdominal tenderness. There is no right CVA tenderness, left CVA tenderness, guarding or rebound. Negative signs include Murphy's  sign.     Comments: Soft, nontender, negative Murphy sign, no rebound or guarding.  Musculoskeletal:        General: Normal range of motion.     Cervical back: Full passive range of motion without pain, normal range of motion and neck supple.     Comments: No bony tenderness, compartments soft, full range of motion, calf soft, nontender.  No lower extremity edema  Skin:    General: Skin is warm and dry.     Capillary Refill: Capillary refill takes less than 2 seconds.  Comments: No obvious rashes or lesions to exposed skin  Neurological:     General: No focal deficit present.     Mental Status: He is alert and oriented to person, place, and time.     Cranial Nerves: Cranial nerves 2-12 are intact.     Sensory: Sensation is intact.     Motor: Motor function is intact.     Coordination: Coordination is intact.     Gait: Gait is intact.    (all labs ordered are listed, but only abnormal results are displayed) Labs Reviewed  BASIC METABOLIC PANEL WITH GFR - Abnormal; Notable for the following components:      Result Value   Glucose, Bld 101 (*)    All other components within normal limits  CBC - Abnormal; Notable for the following components:   RBC 3.58 (*)    Hemoglobin 9.4 (*)    HCT 29.5 (*)    RDW 15.9 (*)    All other components within normal limits  HEPATIC FUNCTION PANEL - Abnormal; Notable for the following components:   Indirect Bilirubin 0.1 (*)    All other components within normal limits  TROPONIN T, HIGH SENSITIVITY - Abnormal; Notable for the following components:   Troponin T High Sensitivity 47 (*)    All other components within normal limits  TROPONIN T, HIGH SENSITIVITY - Abnormal; Notable for the following components:   Troponin T High Sensitivity 43 (*)    All other components within normal limits  RESP PANEL BY RT-PCR (RSV, FLU A&B, COVID)  RVPGX2  LIPASE, BLOOD  D-DIMER, QUANTITATIVE    EKG: EKG Interpretation Date/Time:  Monday July 19 2024  13:46:01 EDT Ventricular Rate:  91 PR Interval:  137 QRS Duration:  82 QT Interval:  334 QTC Calculation: 411 R Axis:   51  Text Interpretation: Sinus rhythm Probable left atrial enlargement Confirmed by Ula Barter [phone]) on 07/19/2024 1:58:00 PM  Radiology: [facility] Chest Port 1 View Result Date: 07/19/2024 CLINICAL DATA:  Chest pain EXAM: PORTABLE CHEST 1 VIEW COMPARISON:  Chest x-ray 12/29/2023 FINDINGS: There is stable elevation of the right hemidiaphragm. The heart size and mediastinal contours are within normal limits. Both lungs are clear. Degenerative changes affect both shoulders. IMPRESSION: No active disease. Electronically Signed   By: Greig Pique M.D.   On: 07/19/2024 15:44     Procedures   Medications Ordered in the ED  alum & mag hydroxide-simeth (MAALOX/MYLANTA) 200-200-20 MG/5ML suspension 30 mL (30 mLs Oral Given 07/19/24 1437)    And  lidocaine  (XYLOCAINE ) 2 % viscous mouth solution 15 mL (15 mLs Oral Given 07/19/24 3817)    83 year old here for evaluation of chest pain. Ongoing over the last 3 weeks.  Nonexertional, nonpleuritic, not associated with food intake or movement.  Describes as a burning sensation.  He has some chronic intermittent cough and rhinorrhea which is unchanged.  No PND orthopnea.  He does not appear grossly fluid overloaded.  No lower extremity edema.  My suspicion for pulmonary embolism is low.  He did have an EGD about a month ago which showed a duodenal ulcer per review of procedure.  He denies any bright red blood per rectum, melena, abdominal pain, nausea or vomiting.  Taken Imdur  without relief.  Will try GI cocktail, plan on labs and imaging  Labs and imaging personally viewed and interpreted:  CBC without significant abnormality BMP  glucose 101 Hep fun wo acute abnormality Lipase 27 Trop 47--43 COVID, flu rsv neg DG chest  without cardiomegaly, pulm edema, pneumothorax EKG wo ischemic changes Ddimer <0.27  Patient reassessed.  Symptoms  improved after GI cocktail.  Atypical chest pain.  I did review his recent coronary calcium  screen.  With 83rd percentile for age.  Will touch base with cardiology  Discussed with cardiology recommend close follow-up outpatient, return for any worsening symptoms.  I discussed with patient.  He is agreeable for outpatient follow-up.  At this time low suspicion for acute ACS, PE, dissection, pneumothorax, bacterial infectious process, unstable angina, [place], perforated viscus, obstruction, GI bleed, perforated ulcer, cholecystitis, cholangitis, symptomatic cholelithiasis, esophageal spasm.  The patient has been appropriately medically screened and/or stabilized in the ED. I have low suspicion for any other emergent medical condition which would require further screening, evaluation or treatment in the ED or require inpatient management.  Patient is hemodynamically stable and in no acute distress.  Patient able to ambulate in department prior to ED.  Evaluation does not show acute pathology that would require ongoing or additional emergent interventions while in the emergency department or further inpatient treatment.  I have discussed the diagnosis with the patient and answered all questions.  Pain is been managed while in the emergency department and patient has no further complaints prior to discharge.  Patient is comfortable with plan discussed in room and is stable for discharge at this time.  I have discussed strict return precautions for returning to the emergency department.  Patient was encouraged to follow-up with PCP/specialist refer to at discharge.      Clinical Course as of 07/19/24 1818  Mon Jul 19, 2024  1707 Discussed with Dr. Jeffrie with cardiology.  He reviewed his recent coronary calcium  scan in Jan 2025.  Given flat troponins can DC home, close follow-up outpatient.  If symptoms worsen he needs to return to the emergency department [BH]    Clinical Course User Index [BH]  Maison Kestenbaum A, PA-C                                 Medical Decision Making Amount and/or Complexity of Data Reviewed External Data Reviewed: labs, radiology, ECG and notes. Labs: ordered. Decision-making details documented in ED Course. Radiology: ordered and independent interpretation performed. Decision-making details documented in ED Course. ECG/medicine tests: ordered and independent interpretation performed. Decision-making details documented in ED Course.  Risk OTC drugs. Prescription drug management. Parenteral controlled substances. Decision regarding hospitalization. Diagnosis or treatment significantly limited by social determinants of health.      Final diagnoses:  Precordial pain    ED Discharge Orders          Ordered    alum & mag hydroxide-simeth (MAALOX MAX) 400-400-40 MG/5ML suspension  Every 6 hours PRN        07/19/24 1752    lidocaine  (XYLOCAINE ) 2 % solution  As needed        07/19/24 1752               Elver Stadler A, PA-C 07/19/24 1818    Ula Prentice SAUNDERS, MD 07/21/24 2314

## 2024-07-22 NOTE — Telephone Encounter (Signed)
 Called and spoke with patient. Patient aware of appointment on Tuesday August 19th, 2025 with Glendia Ferrier, PA-C.

## 2024-07-29 ENCOUNTER — Telehealth: Payer: Self-pay | Admitting: *Deleted

## 2024-07-29 DIAGNOSIS — Z131 Encounter for screening for diabetes mellitus: Secondary | ICD-10-CM

## 2024-07-29 NOTE — Telephone Encounter (Signed)
 Asking if Dr. Cloretta will do screening lab for diabetes on 8/11 visit. Per Dr. Cloretta, OK to add a Hemoglogin A1c. Will forward results to PCP.

## 2024-08-02 ENCOUNTER — Inpatient Hospital Stay: Admitting: Oncology

## 2024-08-02 ENCOUNTER — Inpatient Hospital Stay: Attending: Oncology

## 2024-08-02 VITALS — BP 124/58 | HR 91 | Temp 98.1°F | Resp 18 | Ht 62.0 in | Wt 133.3 lb

## 2024-08-02 DIAGNOSIS — M069 Rheumatoid arthritis, unspecified: Secondary | ICD-10-CM | POA: Insufficient documentation

## 2024-08-02 DIAGNOSIS — D509 Iron deficiency anemia, unspecified: Secondary | ICD-10-CM | POA: Insufficient documentation

## 2024-08-02 DIAGNOSIS — J449 Chronic obstructive pulmonary disease, unspecified: Secondary | ICD-10-CM | POA: Insufficient documentation

## 2024-08-02 DIAGNOSIS — Z131 Encounter for screening for diabetes mellitus: Secondary | ICD-10-CM | POA: Diagnosis not present

## 2024-08-02 DIAGNOSIS — D508 Other iron deficiency anemias: Secondary | ICD-10-CM | POA: Diagnosis not present

## 2024-08-02 LAB — FERRITIN: Ferritin: 32 ng/mL (ref 24–336)

## 2024-08-02 LAB — CBC WITH DIFFERENTIAL (CANCER CENTER ONLY)
Abs Immature Granulocytes: 0 K/uL (ref 0.00–0.07)
Basophils Absolute: 0 K/uL (ref 0.0–0.1)
Basophils Relative: 1 %
Eosinophils Absolute: 0.1 K/uL (ref 0.0–0.5)
Eosinophils Relative: 2 %
HCT: 32.5 % — ABNORMAL LOW (ref 39.0–52.0)
Hemoglobin: 10.2 g/dL — ABNORMAL LOW (ref 13.0–17.0)
Immature Granulocytes: 0 %
Lymphocytes Relative: 18 %
Lymphs Abs: 0.6 K/uL — ABNORMAL LOW (ref 0.7–4.0)
MCH: 26.3 pg (ref 26.0–34.0)
MCHC: 31.4 g/dL (ref 30.0–36.0)
MCV: 83.8 fL (ref 80.0–100.0)
Monocytes Absolute: 0.5 K/uL (ref 0.1–1.0)
Monocytes Relative: 14 %
Neutro Abs: 2.3 K/uL (ref 1.7–7.7)
Neutrophils Relative %: 65 %
Platelet Count: 277 K/uL (ref 150–400)
RBC: 3.88 MIL/uL — ABNORMAL LOW (ref 4.22–5.81)
RDW: 16.4 % — ABNORMAL HIGH (ref 11.5–15.5)
WBC Count: 3.6 K/uL — ABNORMAL LOW (ref 4.0–10.5)
nRBC: 0 % (ref 0.0–0.2)

## 2024-08-02 NOTE — Progress Notes (Signed)
  [facility] OFFICE PROGRESS NOTE   Diagnosis: Iron  anemia  INTERVAL HISTORY:   Tony. Walston returns as scheduled.  No bleeding.  He takes iron  once daily.  He reports iron  causes constipation.  He has arthritis pain in the hands and left shoulder. He underwent a colonoscopy and upper endoscopy on 06/17/2024.  Multiple AVMs were treated with APC.  Objective:  Vital signs in last 24 hours:  Blood pressure (!) 124/58, pulse 91, temperature 98.1 F (36.7 C), temperature source Temporal, resp. rate 18, height 5' 2 (1.575 m), weight 133 lb 4.8 oz (60.5 kg), SpO2 98%.    Lymphatics: No cervical, supraclavicular, or axillary nodes Resp: Breath sounds at the right lower posterior chest, no respiratory distress Cardio: Regular rate and rhythm GI: No hepatosplenomegaly Vascular: Trace lower leg edema bilaterally   Lab Results:  Lab Results  Component Value Date   WBC 3.6 (L) 08/02/2024   HGB 10.2 (L) 08/02/2024   HCT 32.5 (L) 08/02/2024   MCV 83.8 08/02/2024   PLT 277 08/02/2024   NEUTROABS 2.3 08/02/2024    CMP  Lab Results  Component Value Date   NA 138 07/19/2024   K 4.3 07/19/2024   CL 103 07/19/2024   CO2 24 07/19/2024   GLUCOSE 101 (H) 07/19/2024   BUN 14 07/19/2024   CREATININE 1.04 07/19/2024   CALCIUM  9.3 07/19/2024   PROT 7.0 07/19/2024   ALBUMIN 4.1 07/19/2024   AST 22 07/19/2024   ALT 15 07/19/2024   ALKPHOS 65 07/19/2024   BILITOT 0.2 07/19/2024   GFRNONAA >60 07/19/2024   GFRAA 54 (L) 12/27/2020   Ferritin-43  Medications: I have reviewed the patient's current medications.   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  He has persistent normocytic anemia.  The normocytic anemia is likely secondary to rheumatoid arthritis.  The ferritin level is normal.  He will continue iron .  He will return for an office visit and CBC in 3 months.  He will call for symptoms of anemia.  Arley Hof, MD  08/02/2024  11:10 AM

## 2024-08-02 NOTE — Addendum Note (Signed)
 Addended by: STEVA DEVERE CROME on: 08/02/2024 11:41 AM   Modules accepted: Orders

## 2024-08-03 LAB — HEMOGLOBIN A1C
Hgb A1c MFr Bld: 5.1 % (ref 4.8–5.6)
Mean Plasma Glucose: 100 mg/dL

## 2024-08-10 ENCOUNTER — Encounter: Payer: Self-pay | Admitting: Physician Assistant

## 2024-08-10 ENCOUNTER — Ambulatory Visit: Attending: Physician Assistant | Admitting: Physician Assistant

## 2024-08-10 VITALS — BP 102/50 | HR 90 | Ht 62.0 in | Wt 131.4 lb

## 2024-08-10 DIAGNOSIS — D500 Iron deficiency anemia secondary to blood loss (chronic): Secondary | ICD-10-CM | POA: Diagnosis not present

## 2024-08-10 DIAGNOSIS — I25119 Atherosclerotic heart disease of native coronary artery with unspecified angina pectoris: Secondary | ICD-10-CM | POA: Diagnosis not present

## 2024-08-10 DIAGNOSIS — E782 Mixed hyperlipidemia: Secondary | ICD-10-CM

## 2024-08-10 MED ORDER — ISOSORBIDE MONONITRATE ER 30 MG PO TB24
45.0000 mg | ORAL_TABLET | Freq: Every day | ORAL | 3 refills | Status: DC
Start: 2024-08-10 — End: 2024-09-23

## 2024-08-10 NOTE — Assessment & Plan Note (Signed)
 Continue Atorvastatin  40 mg once daily. Arrange Lipids, ALT. Goal LDL at least < 70.

## 2024-08-10 NOTE — Progress Notes (Signed)
 OFFICE NOTE:    Date:  08/10/2024  ID:  Tony Mcmillan, DOB 1941/12/02, MRN [id number] PCP: Oris Camie BRAVO, NP  [facility] Providers Cardiologist:  Oneil Parchment, MD Cardiology APP:  Lelon Glendia DASEN, PA-C       Aortic valve calcification Coronary artery disease  Coronary artery calcification on Chest CT in 09/2016 and 01/2019 TTE 10/14/16:  Mild focal basal septal hypertrophy, EF 55-60, no RWMA, Gr 1 DD, trivial MR, normal RVSF, mild TR, PASP 28 Carotid US  02/15/2019:  No ICA stenosis  CCTA 03/2020: CAC 1067; non-obstructive CAD  Myoview  03/01/21:  EF 72, inf artifact, normal perfusion, low risk Chest pain:  Rx w Toprol  XL, Imdur   CCTA 01/12/24: Nonobstructive CAD - CAC score 1251 (88th percentile), TPV severe (TPV 640 mm-37th percentile, calcific plaque 51 mm, noncalcific plaque 589 mm); pRCA 0-24, mRCA 0-24, dRCA 0-24; LM 0-24; pLAD 0-24, mLAD 0-24; pLCx 0-24, mLCx 0-24; pRI 0-24 TTE 01/20/24: EF 60-65, no RWMA, mild asymmetric LVH, NL RVSF, NL PASP, triival MR, mild AI, AV sclerosis, RAP 3  PACs Monitor 01/2019: NSR, PACs Hyperlipidemia  Emphysema Iron  deficiency anemia  Sees heme; 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; Rx w IV Fe Colo/EGD 05/29/2024 - s/p APC Rheumatoid arthritis  GERD        Discussed the use of AI scribe software for clinical note transcription with the patient, who gave verbal consent to proceed. History of Present Illness Tony Mcmillan is a 83 y.o. male who returns for follow up after a recent visit to the ED 7/28 for chest pain. His hsTrops were mildly elevated and flat. EDP notes indicate symptoms improved with GI cocktail. EDP d/w attending cardiologist who recommended OP eval. Of note, pt had stopped Imdur  b/c he had no improvement in symptoms (prescribed previously).   He has ongoing chest pain described as a 'tightness' that is present most of the time and does not change with movement, eating, or breathing. He occasionally experiences nausea and coughs up phlegm  at night. No shortness of breath, fever, or changes in stool color. The patient reports dark stools and believes it may be due to taking iron . He has been using nitroglycerin  occasionally, which has helped with different chest pain episodes in the past, but not with the current persistent pain. He is currently taking isosorbide  mononitrate (Imdur ) and confirms taking it regularly despite initial confusion about his medication list.   ROS-See HPI    Studies Reviewed:      Labs - Chart Review 07/19/24: K 4.3, SCr 1.04, eGFR > 60, ALT 15, hsT 47>>43, Hgb 9.4, DDimer <0.27, Flu/RSV/SARS CoV2 neg 08/02/24: Hgb 10.2        Physical Exam:  VS:  BP (!) 102/50   Pulse 90   Ht 5' 2 (1.575 m)   Wt 131 lb 6.4 oz (59.6 kg)   SpO2 97%   BMI 24.03 kg/m        Wt Readings from Last 3 Encounters:  08/10/24 131 lb 6.4 oz (59.6 kg)  08/02/24 133 lb 4.8 oz (60.5 kg)  07/19/24 130 lb (59 kg)    Constitutional:      Appearance: Healthy appearance. Not in distress.  Neck:     Vascular: JVD normal.  Pulmonary:     Breath sounds: Normal breath sounds. No wheezing. No rales.  Cardiovascular:     Normal rate. Regular rhythm.     Murmurs: There is no murmur.  Edema:    Peripheral edema  absent.  Abdominal:     Palpations: Abdomen is soft.       Assessment and Plan:    Assessment & Plan Coronary artery disease involving native coronary artery of native heart with angina pectoris [facility]) Jan 2025 CCTA showed nonobstructive CAD, high TPV. He was recently seen in the ED with chest pain that improved with GI cocktail. His Troponins were mildly elevated and flat. His elevated troponins are likely related to his chronic anemia. He had been given Imdur  in the past but stopped due to lack of improvement in symptoms. However, he notes today he is taking the Imdur . He has a chronic R sided chest pain that has been present for weeks. It is non-exertional. It is not related to meals or positional changes. It is not  tender to palpation. He does have occasional chest pain that is relieved by NTG. However, this pain is not the same. As he had nonobstructive CAD on CCTA 7 mos ago, I do not think he needs follow up ischemic testing. We discussed the rationale for proceeding with ischemia eval vs med Rx. Through shared decision making, we opted to pursue med Rx unless he has an escalation in his symptoms.  -Continue ASA 81 mg once daily, Metoprolol  succinate 25 mg once daily, Atorvastatin  40 mg once daily, NTG prn  -Increase Imdur  to 45 mg once daily  Mixed hyperlipidemia Continue Atorvastatin  40 mg once daily. Arrange Lipids, ALT. Goal LDL at least < 70.  Iron  deficiency anemia due to chronic blood loss He is followed by Heme. Recent Hgb stable.          Dispo:  Return in about 6 months (around 02/10/2025) for Routine Follow Up, w/ Glendia Ferrier, PA-C.  Signed, Glendia Ferrier, PA-C

## 2024-08-10 NOTE — Assessment & Plan Note (Signed)
 He is followed by Heme. Recent Hgb stable.

## 2024-08-10 NOTE — Patient Instructions (Signed)
 Medication Instructions:  Your physician has recommended you make the following change in your medication:   INCREASE the Imdur  to 30 mg taking 1 and 1/2 tablet daily  *If you need a refill on your cardiac medications before your next appointment, please call your pharmacy*  Lab Work: COME BACK IN 1-2 WEEKS, FASTING, FOR:  LIPID & ALT  If you have labs (blood work) drawn today and your tests are completely normal, you will receive your results only by: MyChart Message (if you have MyChart) OR A paper copy in the mail If you have any lab test that is abnormal or we need to change your treatment, we will call you to review the results.  Testing/Procedures: None ordered  Follow-Up: At [facility], you and your health needs are our priority.  As part of our continuing mission to provide you with exceptional heart care, our providers are all part of one team.  This team includes your primary Cardiologist (physician) and Advanced Practice Providers or APPs (Physician Assistants and Nurse Practitioners) who all work together to provide you with the care you need, when you need it.  Your next appointment:   6 month(s)  Provider:   Glendia Ferrier, PA-C          We recommend signing up for the patient portal called MyChart.  Sign up information is provided on this After Visit Summary.  MyChart is used to connect with patients for Virtual Visits (Telemedicine).  Patients are able to view lab/test results, encounter notes, upcoming appointments, etc.  Non-urgent messages can be sent to your provider as well.   To learn more about what you can do with MyChart, go to [email or web address].   Other Instructions

## 2024-08-10 NOTE — Assessment & Plan Note (Addendum)
 Jan 2025 CCTA showed nonobstructive CAD, high TPV. He was recently seen in the ED with chest pain that improved with GI cocktail. His Troponins were mildly elevated and flat. His elevated troponins are likely related to his chronic anemia. He had been given Imdur  in the past but stopped due to lack of improvement in symptoms. However, he notes today he is taking the Imdur . He has a chronic R sided chest pain that has been present for weeks. It is non-exertional. It is not related to meals or positional changes. It is not tender to palpation. He does have occasional chest pain that is relieved by NTG. However, this pain is not the same. As he had nonobstructive CAD on CCTA 7 mos ago, I do not think he needs follow up ischemic testing. We discussed the rationale for proceeding with ischemia eval vs med Rx. Through shared decision making, we opted to pursue med Rx unless he has an escalation in his symptoms.  -Continue ASA 81 mg once daily, Metoprolol  succinate 25 mg once daily, Atorvastatin  40 mg once daily, NTG prn  -Increase Imdur  to 45 mg once daily

## 2024-08-18 ENCOUNTER — Ambulatory Visit: Payer: Self-pay | Admitting: Physician Assistant

## 2024-08-18 ENCOUNTER — Other Ambulatory Visit: Payer: Self-pay | Admitting: Physician Assistant

## 2024-08-18 DIAGNOSIS — I25119 Atherosclerotic heart disease of native coronary artery with unspecified angina pectoris: Secondary | ICD-10-CM

## 2024-08-18 DIAGNOSIS — E782 Mixed hyperlipidemia: Secondary | ICD-10-CM

## 2024-08-18 LAB — LIPID PANEL
Chol/HDL Ratio: 2.5 ratio (ref 0.0–5.0)
Cholesterol, Total: 159 mg/dL (ref 100–199)
HDL: 63 mg/dL (ref >39)
LDL Chol Calc (NIH): 75 mg/dL (ref 0–99)
Triglycerides: 118 mg/dL (ref 0–149)
VLDL Cholesterol Cal: 21 mg/dL (ref 5–40)

## 2024-08-18 LAB — ALT: ALT: 16 IU/L (ref 0–44)

## 2024-08-18 MED ORDER — ATORVASTATIN CALCIUM 80 MG PO TABS: 80.0000 mg | ORAL_TABLET | Freq: Every day | ORAL | 3 refills | Status: AC

## 2024-08-26 ENCOUNTER — Encounter: Payer: Self-pay | Admitting: Nurse Practitioner

## 2024-08-26 ENCOUNTER — Ambulatory Visit: Payer: Self-pay | Admitting: Family Medicine

## 2024-08-26 ENCOUNTER — Encounter: Payer: Self-pay | Admitting: Family Medicine

## 2024-08-26 VITALS — BP 118/70 | HR 97 | Wt 131.8 lb

## 2024-08-26 DIAGNOSIS — J438 Other emphysema: Secondary | ICD-10-CM | POA: Diagnosis not present

## 2024-08-26 DIAGNOSIS — J449 Chronic obstructive pulmonary disease, unspecified: Secondary | ICD-10-CM

## 2024-08-26 LAB — POC COVID19/FLU A&B COMBO
Covid Antigen, POC: NEGATIVE
Influenza A Antigen, POC: NEGATIVE
Influenza B Antigen, POC: NEGATIVE

## 2024-08-26 MED ORDER — PREDNISONE 20 MG PO TABS
40.0000 mg | ORAL_TABLET | Freq: Every day | ORAL | 0 refills | Status: AC
Start: 2024-08-26 — End: 2024-09-02

## 2024-08-26 NOTE — Progress Notes (Signed)
   Name: Tony Mcmillan   Date of Visit: 08/26/24   Date of last visit with me: Visit date not found   CHIEF COMPLAINT:  Chief Complaint  Patient presents with   Acute Visit    Cold symptoms, cough, congestion. Symptoms started yesterday.        HPI:  Discussed the use of AI scribe software for clinical note transcription with the patient, who gave verbal consent to proceed.  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  He has been experiencing persistent shortness of breath since yesterday, which is not specifically worsened by physical activity. He also experiences chest tightness when taking deep breaths.  He has been using his inhalers more frequently, but they have not provided significant relief. He has a cough and hears wheezing at home. He stays hydrated but has a decreased appetite.  He recalls having COVID-19 when it first emerged but did not experience significant symptoms at that time.         OBJECTIVE:       08/02/2024   10:47 AM  Depression screen PHQ 2/9  Decreased Interest 0  Down, Depressed, Hopeless 0  PHQ - 2 Score 0     BP Readings from Last 3 Encounters:  08/26/24 118/70  08/10/24 (!) 102/50  08/02/24 (!) 124/58    BP 118/70   Pulse 97   Wt 131 lb 12.8 oz (59.8 kg)   SpO2 98%   BMI 24.11 kg/m    Physical Exam          Physical Exam Constitutional:      Appearance: Normal appearance.  Pulmonary:     Effort: Pulmonary effort is normal. No respiratory distress.     Breath sounds: No stridor. Wheezing (Expiratory wheezing) present. No rhonchi or rales.  Chest:     Chest wall: No tenderness.  Neurological:     Mental Status: He is alert.     ASSESSMENT/PLAN:   Assessment & Plan Chronic obstructive pulmonary disease, unspecified COPD type (HCC)  Other emphysema (HCC)    Assessment and Plan    COPD with acute exacerbation and emphysema Acute  exacerbation likely due to viral infection. Symptoms include dyspnea, wheezing, and chest tightness. Adequate oxygen  saturation. No fever, reducing bacterial infection likelihood. - Prescribed oral steroids: 2 pills per day for 7 days. - Monitor symptoms, report if no improvement.  Suspected viral respiratory infection Suspected viral infection contributing to COPD exacerbation. No fever. COVID-19 and influenza tests ordered to rule out infections. - Perform COVID-19 and influenza testing. - Advised to wait for test results before leaving clinic.         Tony Mcmillan A. Vita MD [facility]

## 2024-08-26 NOTE — Addendum Note (Signed)
 Addended by: LATTIE ERNST C on: 08/26/2024 11:20 AM   Modules accepted: Orders

## 2024-08-30 ENCOUNTER — Ambulatory Visit: Admitting: Orthopedic Surgery

## 2024-09-21 ENCOUNTER — Ambulatory Visit: Admitting: *Deleted

## 2024-09-21 ENCOUNTER — Ambulatory Visit: Payer: Self-pay | Admitting: Adult Health

## 2024-09-21 ENCOUNTER — Ambulatory Visit (INDEPENDENT_AMBULATORY_CARE_PROVIDER_SITE_OTHER)

## 2024-09-21 ENCOUNTER — Ambulatory Visit: Admitting: Adult Health

## 2024-09-21 VITALS — BP 112/60 | HR 66 | Temp 97.6°F | Ht 62.0 in | Wt 136.0 lb

## 2024-09-21 DIAGNOSIS — J449 Chronic obstructive pulmonary disease, unspecified: Secondary | ICD-10-CM

## 2024-09-21 DIAGNOSIS — M069 Rheumatoid arthritis, unspecified: Secondary | ICD-10-CM

## 2024-09-21 DIAGNOSIS — R0781 Pleurodynia: Secondary | ICD-10-CM | POA: Diagnosis not present

## 2024-09-21 DIAGNOSIS — R0789 Other chest pain: Secondary | ICD-10-CM

## 2024-09-21 LAB — PULMONARY FUNCTION TEST
DL/VA % pred: 97 %
DL/VA: 3.92 ml/min/mmHg/L
DLCO cor % pred: 62 %
DLCO cor: 11.63 ml/min/mmHg
DLCO unc % pred: 53 %
DLCO unc: 9.88 ml/min/mmHg
FEF 25-75 Post: 0.99 L/s
FEF 25-75 Pre: 0.87 L/s
FEF2575-%Change-Post: 12 %
FEF2575-%Pred-Post: 84 %
FEF2575-%Pred-Pre: 74 %
FEV1-%Change-Post: 2 %
FEV1-%Pred-Post: 72 %
FEV1-%Pred-Pre: 70 %
FEV1-Post: 1.32 L
FEV1-Pre: 1.29 L
FEV1FVC-%Change-Post: 8 %
FEV1FVC-%Pred-Pre: 102 %
FEV6-%Change-Post: -5 %
FEV6-%Pred-Post: 68 %
FEV6-%Pred-Pre: 72 %
FEV6-Post: 1.67 L
FEV6-Pre: 1.76 L
FEV6FVC-%Change-Post: 0 %
FEV6FVC-%Pred-Post: 109 %
FEV6FVC-%Pred-Pre: 109 %
FVC-%Change-Post: -5 %
FVC-%Pred-Post: 62 %
FVC-%Pred-Pre: 65 %
FVC-Post: 1.67 L
FVC-Pre: 1.76 L
Post FEV1/FVC ratio: 79 %
Post FEV6/FVC ratio: 100 %
Pre FEV1/FVC ratio: 73 %
Pre FEV6/FVC Ratio: 100 %
RV % pred: 114 %
RV: 2.6 L
TLC % pred: 82 %
TLC: 4.49 L

## 2024-09-21 NOTE — Patient Instructions (Signed)
 Full PFT performed today.

## 2024-09-21 NOTE — Progress Notes (Signed)
 Full PFT performed today.

## 2024-09-21 NOTE — Progress Notes (Unsigned)
 @Patient  ID: Tony Mcmillan, male    DOB: 12-08-1941, 83 y.o.   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  History of nocturnal hypoxemia on nocturnal oxygen -patient stopped and returned oxygen  to DME company Medical history significant for rheumatoid arthritis ([facility] and [facility]) and iron  deficiency anemia    TEST/EVENTS :  HRCT chest January 2022 negative for ILD, elevation of right hemidiaphragm with associated scarring and atelectasis in the right base, emphysema    PFT 10/2020 Mild to moderate restriction with decreased diffucing capacity , FEV1 80%, ratio 81, FVC 71%, diffusing capacity 60%   2D echo October 2017 EF 55 to 60%, grade 1 diastolic dysfunction, normal pulmonary artery systolic pressure   Stress Myoview  March 2022 EF at 72%, low risk study.   Discussed the use of AI scribe software for clinical note transcription with the patient, who gave verbal consent to proceed.  History of Present Illness Tony Mcmillan is an 83 year old male with COPD and rheumatoid arthritis who presents for a pulmonary follow-up.  He experiences occasional chest tightness in right chest and ribs into back muscles, sore to touch and move at time, which has been persistent for long time.  The pain is localized to the ribs without radiation to the jaw or arm. No left sided chest pain. No palpitations or syncope.   He continues to use Bevespi  inhaler twice daily for COPD and has an albuterol  inhaler for infrequent use. He experiences occasional coughing but no wheezing, increased shortness of breath, sinus drainage, or mucus production. A chest x-ray two months ago was clear. Pulmonary function testing completed today shows moderate restriction with FEV1 at 72%,  ratio 79, FVC 62%, no significant bronchodilator response, mid flow reversibility, DLCO 53%.  He is on Arava (leflunomide) and Remicade for rheumatoid arthritis. Says overall joint pain is stable   He mentions being anemic and was hospitalized earlier this year, but reports improvement with no recent bleeding.  He received a flu shot recently, which made him feel unwell.  Had coronary CT chest in January 2025 that showed stable elevation of the right hemidiaphragm and mild atelectatic changes in the visualized portion of the lungs on the right lung base   No Known Allergies  Immunization History  Administered Date(s) Administered    sv, Bivalent, Protein Subunit Rsvpref,pf (Abrysvo) 03/14/2023   INFLUENZA, HIGH DOSE SEASONAL PF 07/23/2017, 08/05/2019, 08/30/2020, 08/22/2021, 08/10/2022, 08/22/2023, 08/18/2024   Influenza,inj,Quad PF,6+ Mos 08/01/2017   Influenza-Unspecified 10/30/2015, 09/13/2016, 07/30/2018, 08/05/2019   PFIZER Comirnaty(Gray Top)Covid-19 Tri-Sucrose Vaccine 05/12/2021, 09/23/2022   PFIZER(Purple Top)SARS-COV-2 Vaccination 01/28/2020, 02/18/2020, 09/19/2020   PNEUMOCOCCAL CONJUGATE-20 08/17/2021   Pfizer Covid-19 Vaccine Bivalent Booster 38yrs & up 11/05/2021   Pneumococcal Conjugate-13 12/05/2015   Pneumococcal Polysaccharide-23 04/23/2017   Tdap 12/09/2015   Zoster Recombinant(Shingrix) 10/26/2021, 08/20/2022   Zoster, Live 12/09/2015    Past Medical History:  Diagnosis Date   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 [facility].     Atypical chest pain    a. 09/2016 MV: EF 63%, normal perfusion.   Benign prostatic hyperplasia 04/03/2016   Asymptomatic. Normal  PSA and exam by Dr. [facility] 01/2016.     Bilateral carotid bruits 02/09/2019   Bladder wall thickening 03/10/2020   CAD  (coronary artery disease)    Coronary artery calcification on Chest CT in 09/2016 and 01/2019 // Myoview  10/17: normal perfusion // Myoview  01/2019: no ischemia or infarction, EF 58 // Coronary CTA 03/2020: Calcium  Score 1067; non-obstructive CAD [oLM 1-24, LAD ost 25049, mid and dist 1-24; D1 1-24; LCx prox and mid 1-24, OM1 and OM2 25-49, RCA and PLB 1-24; PLA 1-24]    Carotid bruit    Carotid Dopplers 01/2019: no ICA stenosis bilaterally    Chronic wrist pain 07/02/2016   Colonic polyp 02/23/2016   COVID-19    DDD (degenerative disc disease), 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 L4-5 and L5-S1    Diastolic dysfunction    a. 2015 Echo: EF >55%;  b. 09/2016 Echo: EF 55-60%, no rwma, Gr1 DD, triv AI/MR, mild TR, PASP .   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   On CT 01/2016    Palpitations    Echo 10/17: Mild focal basal septal hypertrophy, EF 55-60, no RWMA, Gr 1 DD, trivial MR, normal RVSF, mild TR, PASP 28 // Event monitor 01/2019: NSR, PACs   Prediabetes 10/29/2018   Pulmonary emphysema (HCC) 12/18/2016   Renal cyst, left 04/03/2016   On CT, benign    Rheumatoid arthritis (HCC)    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   -RF,-CCP,-[id number], erosive disease with contractures   Tubular adenoma of colon 01/2016   Vitamin D  deficiency 11/04/2016    Tobacco History: Social History   Tobacco Use  Smoking Status Former   Current packs/day: 0.00   Average packs/day: 1 pack/day for 17.0 years (17.0 ttl  pk-yrs)   Types: Cigarettes   Start date: 12/23/1976   Quit date: 12/23/1993   Years since quitting: 30.7  Smokeless Tobacco Never   Counseling given: Not Answered   Outpatient Medications Prior to Visit  Medication Sig Dispense Refill   albuterol  (VENTOLIN  HFA) 108 (90 Base) MCG/ACT inhaler Inhale 2 puffs into the lungs every 6 (six) hours as needed for wheezing or shortness of breath. 3 each 1   aspirin  EC (ASPIRIN  LOW DOSE) 81 MG tablet TAKE 1 TABLET BY MOUTH DAILY *SWALLOW WHOLE* 90 tablet 3   atorvastatin  (LIPITOR) 80 MG tablet Take 1 tablet (80 mg total) by mouth daily. 90 tablet 3   Calcium  Carb-Cholecalciferol (CALCIUM -VITAMIN D3) 600-400 MG-UNIT TABS Take 1 tablet by mouth 2 (two) times daily.     docusate sodium (COLACE) 250 MG capsule Take 250 mg by mouth daily.     ferrous sulfate  325 (65 FE) MG EC tablet Take 325 mg by mouth daily with breakfast.     Glycopyrrolate -Formoterol  (BEVESPI  AEROSPHERE) 9-4.8 MCG/ACT AERO Inhale 2 puffs into the lungs in the morning and at bedtime. 3 each 1   isosorbide  mononitrate (IMDUR ) 30 MG 24 hr tablet Take 1.5 tablets (45 mg total) by mouth daily. 135 tablet 3  latanoprost (XALATAN) 0.005 % ophthalmic solution Place 1 drop into both eyes at bedtime.     leflunomide (ARAVA) 20 MG tablet Take 20 mg by mouth daily.     MYRBETRIQ 50 MG TB24 tablet Take 50 mg by mouth daily.      nitroGLYCERIN  (NITROSTAT ) 0.4 MG SL tablet PLACE 1 TABLET UNDER THE TONGUE EVERY 5 MINUTES AS NEEDED FOR CHEST PAIN. 25 tablet 7   pantoprazole  (PROTONIX ) 40 MG tablet Take 1 tablet (40 mg total) by mouth daily. 90 tablet 3   polyethylene glycol (MIRALAX ) 17 g packet Take 17 g by mouth daily as needed. 14 each 0   REMICADE 100 MG injection      gabapentin  (NEURONTIN ) 100 MG capsule TAKE 2 CAPSULES BY MOUTH AT BEDTIME. 40 capsule 0   metoprolol  succinate (TOPROL -XL) 25 MG 24 hr tablet Take 1 tablet (25 mg total) by mouth daily. 90 tablet 2   No facility-administered  medications prior to visit.     Review of Systems:   Constitutional:   No  weight loss, night sweats,  Fevers, chills,+ fatigue, or  lassitude.  HEENT:   No headaches,  Difficulty swallowing,  Tooth/dental problems, or  Sore throat,                No sneezing, itching, ear ache, nasal congestion, post nasal drip,   CV:  No chest pain,  Orthopnea, PND, swelling in lower extremities, anasarca, dizziness, palpitations, syncope.   GI  No heartburn, indigestion, abdominal pain, nausea, vomiting, diarrhea, change in bowel habits, loss of appetite, bloody stools.   Resp:   No chest wall deformity  Skin: no rash or lesions.  GU: no dysuria, change in color of urine, no urgency or frequency.  No flank pain, no hematuria   MS:  No joint pain or swelling.  No decreased range of motion.  No back pain.    Physical Exam  BP 112/60 (BP Location: Left Arm, Patient Position: Sitting)   Pulse 66   Temp 97.6 F (36.4 C) (Oral)   Ht 5' 2 (1.575 m)   Wt 136 lb (61.7 kg)   SpO2 98% Comment: RA  BMI 24.87 kg/m   GEN: A/Ox3; pleasant , NAD, well nourished    HEENT:  [place]/AT,   NOSE-clear, THROAT-clear, no lesions, no postnasal drip or exudate noted.   NECK:  Supple w/ fair ROM; no JVD; normal carotid impulses w/o bruits; no thyromegaly or nodules palpated; no lymphadenopathy.    RESP  Clear  P & A; w/o, wheezes/ rales/ or rhonchi. no accessory muscle use, no dullness to percussion  CARD:  RRR, no m/r/g, no peripheral edema, pulses intact, no cyanosis or clubbing.  GI:   Soft & nt; nml bowel sounds; no organomegaly or masses detected.   Musco: Warm bil, no deformities or joint swelling noted.   Neuro: alert, no focal deficits noted.    Skin: Warm, no lesions or rashes    Lab Results:  CBC    Component Value Date/Time   WBC 3.6 (L) 08/02/2024 1020   WBC 5.1 07/19/2024 1412   RBC 3.88 (L) 08/02/2024 1020   HGB 10.2 (L) 08/02/2024 1020   HGB 8.4 (L) 05/09/2022 1141   HCT 32.5  (L) 08/02/2024 1020   HCT 27.8 (L) 05/09/2022 1141   PLT 277 08/02/2024 1020   PLT 320 05/09/2022 1141   MCV 83.8 08/02/2024 1020   MCV 79 05/09/2022 1141   MCH 26.3 08/02/2024 1020  MCHC 31.4 08/02/2024 1020   RDW 16.4 (H) 08/02/2024 1020   RDW 16.2 (H) 05/09/2022 1141   LYMPHSABS 0.6 (L) 08/02/2024 1020   LYMPHSABS 0.7 05/09/2022 1141   MONOABS 0.5 08/02/2024 1020   EOSABS 0.1 08/02/2024 1020   EOSABS 0.1 05/09/2022 1141   BASOSABS 0.0 08/02/2024 1020   BASOSABS 0.0 05/09/2022 1141    BMET    Component Value Date/Time   NA 138 07/19/2024 1412   NA 138 07/03/2022 0914   K 4.3 07/19/2024 1412   CL 103 07/19/2024 1412   CO2 24 07/19/2024 1412   GLUCOSE 101 (H) 07/19/2024 1412   BUN 14 07/19/2024 1412   BUN 13 07/03/2022 0914   CREATININE 1.04 07/19/2024 1412   CREATININE 1.43 (H) 12/27/2020 0000   CALCIUM  9.3 07/19/2024 1412   GFRNONAA >60 07/19/2024 1412   GFRNONAA 47 (L) 12/27/2020 0000   GFRAA 54 (L) 12/27/2020 0000    BNP No results found for: BNP  ProBNP    Component Value Date/Time   PROBNP 66 09/26/2021 1436    Imaging: No results found.  Administration History     None          Latest Ref Rng & Units 09/21/2024   12:22 PM 11/20/2020   12:36 PM 11/26/2016    9:25 AM  PFT Results  FVC-Pre L 1.76  P 1.91  1.79  P  FVC-Predicted Pre % 65  P 71  65  P  FVC-Post L 1.67  P  1.81  P  FVC-Predicted Post % 62  P  65  P  Pre FEV1/FVC % % 73  P 81  81  P  Post FEV1/FCV % % 79  P  82  P  FEV1-Pre L 1.29  P 1.55  1.46  P  FEV1-Predicted Pre % 70  P 80  73  P  FEV1-Post L 1.32  P  1.48  P  DLCO uncorrected ml/min/mmHg 9.88  P 11.83  12.16  P  DLCO UNC% % 53  P 60  53  P  DLCO corrected ml/min/mmHg 11.63  P 12.43  12.31  P  DLCO COR %Predicted % 62  P 63  53  P  DLVA Predicted % 97  P 99  98  P  TLC L 4.49  P  3.64  P  TLC % Predicted % 82  P  64  P  RV % Predicted % 114  P  86  P    P Preliminary result    No results found for:  NITRICOXIDE      Assessment & Plan:   Assessment and Plan Assessment & Plan Chronic obstructive pulmonary disease (COPD)   COPD with emphysema-PFTs show a slight decline in lung function. Occasional cough is reported without significant increase in dyspnea or wheezing. Chest x-ray was clear and stable . Discontinue Bevespi  inhaler and start Breztri  inhaler, two puffs twice daily. Provide a sample of Breztri . . Advise to contact if Breztri  is not affordable or if he prefers to continue Bevespi .  Right rib pain   Intermittent right rib pain is described as a tight neck and back feeling, without associated symptoms such as jaw or arm pain. Chest xray is clear.  May be musculoskeletal in nature.  If continues will need to follow-up with primary care.  Rheumatoid arthritis   Rheumatoid arthritis is managed with Arava (leflunomide) and Remicade.  Continue follow-up with rheumatology   Anemia   Anemia with previous  hospitalization is noted. He reports feeling better with no recent bleeding.  Most recent labs in August 2025 showed stable hemoglobin hematocrit  General Health Maintenance   He received a flu shot this fall and is advised to wait before getting a COVID-19 booster due to a recent flu shot reaction.          Madelin Stank, NP 09/21/2024

## 2024-09-21 NOTE — Patient Instructions (Addendum)
 Chest xray today.  Delsym 2 tsp every 12hr as needed for cough.  Stop Bivespi, begin Breztri  2 puffs Twice daily, rinse after use. -send message or call if you want to change . I will send to your mail order.  Albuterol  inhaler As needed   Activity as tolerated  Follow up with Dr. Geronimo or Zyia Kaneko NP  in 6 months and As needed   Please contact office for sooner follow up if symptoms do not improve or worsen or seek emergency care

## 2024-09-23 ENCOUNTER — Other Ambulatory Visit: Payer: Self-pay | Admitting: Physician Assistant

## 2024-10-08 ENCOUNTER — Ambulatory Visit: Payer: Self-pay | Admitting: Physician Assistant

## 2024-10-08 DIAGNOSIS — I25119 Atherosclerotic heart disease of native coronary artery with unspecified angina pectoris: Secondary | ICD-10-CM

## 2024-10-08 DIAGNOSIS — E782 Mixed hyperlipidemia: Secondary | ICD-10-CM

## 2024-10-08 LAB — LIPID PANEL
Chol/HDL Ratio: 2.5 ratio (ref 0.0–5.0)
Cholesterol, Total: 147 mg/dL (ref 100–199)
HDL: 60 mg/dL (ref >39)
LDL Chol Calc (NIH): 65 mg/dL (ref 0–99)
Triglycerides: 129 mg/dL (ref 0–149)
VLDL Cholesterol Cal: 22 mg/dL (ref 5–40)

## 2024-10-08 LAB — ALT: ALT: 15 IU/L (ref 0–44)

## 2024-10-08 MED ORDER — EZETIMIBE 10 MG PO TABS: 10.0000 mg | ORAL_TABLET | Freq: Every day | ORAL | 3 refills | Status: AC

## 2024-10-25 ENCOUNTER — Encounter: Payer: Self-pay | Admitting: Radiology

## 2024-11-08 ENCOUNTER — Other Ambulatory Visit: Payer: Self-pay

## 2024-11-08 ENCOUNTER — Encounter: Payer: Self-pay | Admitting: Orthopedic Surgery

## 2024-11-08 ENCOUNTER — Ambulatory Visit: Admitting: Orthopedic Surgery

## 2024-11-08 DIAGNOSIS — M19012 Primary osteoarthritis, left shoulder: Secondary | ICD-10-CM | POA: Diagnosis not present

## 2024-11-08 MED ORDER — BUPIVACAINE HCL 0.5 % IJ SOLN
9.0000 mL | INTRAMUSCULAR | Status: AC | PRN
  Administered 2024-11-08: 9 mL via INTRA_ARTICULAR

## 2024-11-08 MED ORDER — LIDOCAINE HCL 1 % IJ SOLN
5.0000 mL | INTRAMUSCULAR | Status: AC | PRN
  Administered 2024-11-08: 5 mL

## 2024-11-08 MED ORDER — METHYLPREDNISOLONE ACETATE 40 MG/ML IJ SUSP
40.0000 mg | INTRAMUSCULAR | Status: AC | PRN
  Administered 2024-11-08: 40 mg via INTRA_ARTICULAR

## 2024-11-08 NOTE — Progress Notes (Signed)
 Office Visit Note   Patient: Tony Mcmillan           Date of Birth: Dec 02, 1941           MRN: [id number] Visit Date: 11/08/2024 Requested by: Oris Camie BRAVO, NP [address] [place],  [place] [address] PCP: Oris Camie BRAVO, NP  Subjective: Chief Complaint  Patient presents with   Left Shoulder - Pain, Follow-up    HPI: Tony Mcmillan is a 83 y.o. male who presents to the office reporting left shoulder pain.  He did have an injection in July of this year which gave him 2 months relief.  He has multiple medical comorbidities and he actually talked with his son who recommended that he not undergo shoulder replacement.  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                ROS: All systems reviewed are negative as they relate to the chief complaint within the history of present illness.  Patient denies fevers or chills.  Assessment & Plan: Visit Diagnoses:  1. Arthritis of left shoulder region     Plan: Impression is end-stage arthritis left shoulder.  Multiple medical comorbidities.  Has not had yet cardiac restratification but he thinks he wants to try to live with this as long as he can.  His shoulder is very stiff.  We will try a glenohumeral joint injection today and then we could repeat that in 4 to 6 months.  Follow-up at that time as needed.  Follow-Up Instructions: No follow-ups on file.   Orders:  No orders of the defined types were placed in this encounter.  No orders of the defined types were placed in this encounter.     Procedures: Large Joint Inj: L glenohumeral on 11/08/2024 1:11 PM Indications: diagnostic evaluation and pain Details: 22 G 3.5 in needle, ultrasound-guided posterior approach  Arthrogram: No  Medications: 9 mL bupivacaine  0.5 %; 40 mg methylPREDNISolone  acetate 40 MG/ML; 5 mL lidocaine  1 % Outcome: tolerated well, no immediate complications Procedure, treatment alternatives, risks and benefits explained, specific risks discussed. Consent was  given by the patient. Immediately prior to procedure a time out was called to verify the correct patient, procedure, equipment, support staff and site/side marked as required. Patient was prepped and draped in the usual sterile fashion.       Clinical Data: No additional findings.  Objective: Vital Signs: There were no vitals taken for this visit.  Physical Exam:  Constitutional: Patient appears well-developed HEENT:  Head: Normocephalic Eyes:EOM are normal Neck: Normal range of motion Cardiovascular: Normal rate Pulmonary/chest: Effort normal Neurologic: Patient is alert Skin: Skin is warm Psychiatric: Patient has normal mood and affect  Ortho Exam: Ortho exam demonstrates range of motion of that left shoulder 5/60/65.  Deltoid does fire.  A lot of bone-on-bone coarseness and grinding is present with passive range of motion of the shoulder.  Radial pulses intact.  Specialty Comments:  Narrative & Impression [facility]   NEUROIMAGING REPORT     STUDY DATE: 07/01/23  MR CERVICAL SPINE WO CONTRAST     COMPARISON: 03/22/23 CT    FINDINGS:    On sagittal views the vertebral bodies have normal height and alignment.  Anterior cervical discectomy and fusion with metal hardware (anteriorly and posteriorly) spanning C2-C6 levels with interbody fusion device spanning C3-C5 vertebral bodies. 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  The posterior fossa, pituitary gland and paraspinal  soft tissues are unremarkable.    On axial views: C2-3 no spinal stenosis or foraminal narrowing C3-4 no spinal stenosis or foraminal narrowing C4-5 no spinal stenosis or foraminal narrowing  C5-6 no spinal stenosis or foraminal narrowing C6-7 no spinal stenosis or foraminal narrowing C7-T1 disc bulging and uncovertebral joint hypertrophy with severe bilateral foraminal stenosis T1-2 disc bulging and facet hypertrophy with mild bilateral foraminal  stenosis T2-3 no spinal stenosis or foraminal narrowing   Limited views of the soft tissues of the head and neck are unremarkable.     IMPRESSION:    MRI cervical spine (without) demonstrating: - Cervical discectomy and fusion spanning C2-C6 with anterior and posterior hardware and interbody fusion device from C3-C5 levels. - At C7-T1 disc bulging and uncovertebral joint hypertrophy with severe bilateral foraminal stenosis. - At T1-2 disc bulging and facet hypertrophy with mild bilateral foraminal stenosis.       INTERPRETING PHYSICIAN:  EDUARD FABIENE HANLON, MD Certified in Neurology, Neurophysiology and Neuroimaging   [facility] [address], [address] [place], [place] [address] [phone]  ----- EMG/NCS 2024 Conclusion:  1. 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  2. 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 3.  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  Imaging: No results found.   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   Precordial chest pain 01/04/2024   Carpal tunnel syndrome, left upper limb 09/15/2023   Cubital tunnel syndrome on left 09/15/2023   Iron  deficiency anemia due to chronic blood loss 06/02/2023   Occipital  neuralgia of left side 03/26/2023   COPD (chronic obstructive pulmonary disease) (HCC) 08/08/2022   Olecranon bursitis of right elbow 05/24/2022   Other long term (current) drug therapy 05/09/2022   Pulmonary fibrosis (HCC) 05/09/2022   Absolute anemia 05/09/2022   Mixed hyperlipidemia 01/21/2022   Premature atrial contractions 01/20/2022   CAD (coronary artery disease)    Enlarged prostate 03/10/2020   Aortic valve calcification 03/10/2020   Bladder wall thickening 03/10/2020   Weight loss 11/15/2019   Bilateral carotid bruits 02/09/2019   History of systemic steroid therapy 10/30/2017   Pulmonary emphysema (HCC) 12/18/2016   Rheumatoid arthritis (HCC) 11/04/2016   High risk medication use 11/04/2016   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   Vitamin D  deficiency 11/04/2016   History of colonic polyps 07/02/2016   Asymptomatic microscopic hematuria 04/03/2016   Benign prostatic hyperplasia 04/03/2016   Renal cyst, left 04/03/2016   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 [facility].     Atypical  chest pain    a. 09/2016 MV: EF 63%, normal perfusion.   Benign prostatic hyperplasia 04/03/2016   Asymptomatic. Normal PSA and exam by Dr. [facility] 01/2016.     Bilateral carotid bruits 02/09/2019   Bladder wall thickening 03/10/2020   CAD (coronary artery disease)    Coronary artery calcification on Chest CT in 09/2016 and 01/2019 // Myoview  10/17: normal perfusion // Myoview  01/2019: no ischemia or infarction, EF 58 // Coronary CTA 03/2020: Calcium  Score 1067; non-obstructive CAD [oLM 1-24, LAD ost 25049, mid and dist 1-24; D1  1-24; LCx prox and mid 1-24, OM1 and OM2 25-49, RCA and PLB 1-24; PLA 1-24]    Carotid bruit    Carotid Dopplers 01/2019: no ICA stenosis bilaterally    Chronic wrist pain 07/02/2016   Colonic polyp 02/23/2016   COVID-19    DDD (degenerative disc disease), 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 L4-5 and L5-S1    Diastolic dysfunction    a. 2015 Echo: EF >55%;  b. 09/2016 Echo: EF 55-60%, no rwma, Gr1 DD, triv AI/MR, mild TR, PASP .   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   On CT 01/2016    Palpitations    Echo 10/17: Mild focal basal septal hypertrophy, EF 55-60, no RWMA, Gr 1 DD, trivial MR, normal RVSF, mild TR, PASP 28 // Event monitor 01/2019: NSR, PACs   Prediabetes 10/29/2018   Pulmonary emphysema (HCC) 12/18/2016   Renal cyst, left 04/03/2016   On CT, benign    Rheumatoid arthritis (HCC)    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   -RF,-CCP,-1433eta, erosive disease with contractures   Tubular adenoma of colon 01/2016   Vitamin D  deficiency 11/04/2016    Family History  Problem Relation Age of Onset   Hypertension Mother    Hypertension Father    Diabetes Father    Heart disease Father        stents   Hyperlipidemia Sister    Diabetes Sister    Stroke Brother    Diabetes Brother    Heart disease Brother        stents   Colon polyps Neg Hx    Esophageal cancer Neg Hx    Stomach cancer Neg Hx    Rectal cancer Neg Hx    Colon cancer Neg Hx     Past Surgical History:  Procedure  Laterality Date   ANTERIOR CERVICAL CORPECTOMY     ANTERIOR CERVICAL CORPECTOMY  12/2014   for infection. this was in [place]   CARPAL TUNNEL RELEASE Left 09/29/2023   CARPAL TUNNEL RELEASE Left    CATARACT EXTRACTION     both eyes   COLONOSCOPY     COLONOSCOPY N/A 06/17/2024   Procedure: COLONOSCOPY;  Surgeon: San Sandor GAILS, DO;  Location: [facility];  Service: Gastroenterology;  Laterality: N/A;   COLOSTOMY REVERSAL     had infection on buttocks and had to have a skin graft- had colostomy to help area stay clean and heal   ESOPHAGOGASTRODUODENOSCOPY  N/A 06/17/2024   Procedure: EGD (ESOPHAGOGASTRODUODENOSCOPY);  Surgeon: San Sandor GAILS, DO;  Location: [facility] ENDOSCOPY;  Service: Gastroenterology;  Laterality: N/A;   HERNIA REPAIR     POLYPECTOMY     SPINE SURGERY     Social History   Occupational History   Not on file  Tobacco Use   Smoking status: Former    Current packs/day: 0.00    Average packs/day: 1 pack/day for 17.0 years (17.0 ttl pk-yrs)    Types: Cigarettes    Start date: 12/23/1976    Quit date: 12/23/1993    Years since quitting: 30.8   Smokeless tobacco: Never  Vaping Use   Vaping status: Never Used  Substance and Sexual Activity   Alcohol use: No    Alcohol/week: 0.0 standard drinks of alcohol   Drug use: No   Sexual activity: Yes

## 2024-12-28 ENCOUNTER — Ambulatory Visit (INDEPENDENT_AMBULATORY_CARE_PROVIDER_SITE_OTHER): Payer: Medicare HMO | Admitting: *Deleted

## 2024-12-28 VITALS — BP 118/74 | Wt 131.0 lb

## 2024-12-28 DIAGNOSIS — Z0000 Encounter for general adult medical examination without abnormal findings: Secondary | ICD-10-CM | POA: Diagnosis not present

## 2024-12-28 NOTE — Patient Instructions (Signed)
 Tony Mcmillan,  Thank you for taking the time for your Medicare Wellness Visit. I appreciate your continued commitment to your health goals. Please review the care plan we discussed, and feel free to reach out if I can assist you further.  Please note that Annual Wellness Visits do not include a physical exam. Some assessments may be limited, especially if the visit was conducted virtually. If needed, we may recommend an in-person follow-up with your provider.  Ongoing Care Seeing your primary care provider every 3 to 6 months helps us  monitor your health and provide consistent, personalized care.   Referrals If a referral was made during today's visit and you haven't received any updates within two weeks, please contact the referred provider directly to check on the status.  Recommended Screenings:  Health Maintenance  Topic Date Due   COVID-19 Vaccine (7 - 2025-26 season) 11/20/2024   DTaP/Tdap/Td vaccine (2 - Td or Tdap) 12/08/2025   Medicare Annual Wellness Visit  12/28/2025   Pneumococcal Vaccine for age over 30  Completed   Flu Shot  Completed   Zoster (Shingles) Vaccine  Completed   Meningitis B Vaccine  Aged Out   Colon Cancer Screening  Discontinued   Hepatitis C Screening  Discontinued       12/28/2024   11:14 AM  Advanced Directives  Does Patient Have a Medical Advance Directive? No    Vision: Annual vision screenings are recommended for early detection of glaucoma, cataracts, and diabetic retinopathy. These exams can also reveal signs of chronic conditions such as diabetes and high blood pressure.  Dental: Annual dental screenings help detect early signs of oral cancer, gum disease, and other conditions linked to overall health, including heart disease and diabetes.  Please see the attached documents for additional preventive care recommendations.    Tony Mcmillan , Thank you for taking time to come for your Medicare Wellness Visit. I appreciate your ongoing commitment to  your health goals. Please review the following plan we discussed and let me know if I can assist you in the future.   Screening recommendations/referrals: Colonoscopy:  Recommended yearly ophthalmology/optometry visit for glaucoma screening and checkup Recommended yearly dental visit for hygiene and checkup  Vaccinations: Influenza vaccine:  Pneumococcal vaccine:  Tdap vaccine:  Shingles vaccine:     Advanced directives:   Conditions/risks identified:   Next appointment:   Preventive Care 65 Years and Older, Male Preventive care refers to lifestyle choices and visits with your health care provider that can promote health and wellness. What does preventive care include? A yearly physical exam. This is also called an annual well check. Dental exams once or twice a year. Routine eye exams. Ask your health care provider how often you should have your eyes checked. Personal lifestyle choices, including: Daily care of your teeth and gums. Regular physical activity. Eating a healthy diet. Avoiding tobacco and drug use. Limiting alcohol use. Practicing safe sex. Taking low doses of aspirin  every day. Taking vitamin and mineral supplements as recommended by your health care provider. What happens during an annual well check? The services and screenings done by your health care provider during your annual well check will depend on your age, overall health, lifestyle risk factors, and family history of disease. Counseling  Your health care provider may ask you questions about your: Alcohol use. Tobacco use. Drug use. Emotional well-being. Home and relationship well-being. Sexual activity. Eating habits. History of falls. Memory and ability to understand (cognition). Work and work astronomer. Screening  You may have the following tests or measurements: Height, weight, and BMI. Blood pressure. Lipid and cholesterol levels. These may be checked every 5 years, or more frequently  if you are over 33 years old. Skin check. Lung cancer screening. You may have this screening every year starting at age 39 if you have a 30-pack-year history of smoking and currently smoke or have quit within the past 15 years. Fecal occult blood test (FOBT) of the stool. You may have this test every year starting at age 80. Flexible sigmoidoscopy or colonoscopy. You may have a sigmoidoscopy every 5 years or a colonoscopy every 10 years starting at age 88. Prostate cancer screening. Recommendations will vary depending on your family history and other risks. Hepatitis C blood test. Hepatitis B blood test. Sexually transmitted disease (STD) testing. Diabetes screening. This is done by checking your blood sugar (glucose) after you have not eaten for a while (fasting). You may have this done every 1-3 years. Abdominal aortic aneurysm (AAA) screening. You may need this if you are a current or former smoker. Osteoporosis. You may be screened starting at age 47 if you are at high risk. Talk with your health care provider about your test results, treatment options, and if necessary, the need for more tests. Vaccines  Your health care provider may recommend certain vaccines, such as: Influenza vaccine. This is recommended every year. Tetanus, diphtheria, and acellular pertussis (Tdap, Td) vaccine. You may need a Td booster every 10 years. Zoster vaccine. You may need this after age 21. Pneumococcal 13-valent conjugate (PCV13) vaccine. One dose is recommended after age 21. Pneumococcal polysaccharide (PPSV23) vaccine. One dose is recommended after age 26. Talk to your health care provider about which screenings and vaccines you need and how often you need them. This information is not intended to replace advice given to you by your health care provider. Make sure you discuss any questions you have with your health care provider. Document Released: 01/05/2016 Document Revised: 08/28/2016 Document  Reviewed: 10/10/2015 Elsevier Interactive Patient Education  2017 [employer].  Fall Prevention in the Home Falls can cause injuries. They can happen to people of all ages. There are many things you can do to make your home safe and to help prevent falls. What can I do on the outside of my home? Regularly fix the edges of walkways and driveways and fix any cracks. Remove anything that might make you trip as you walk through a door, such as a raised step or threshold. Trim any bushes or trees on the path to your home. Use bright outdoor lighting. Clear any walking paths of anything that might make someone trip, such as rocks or tools. Regularly check to see if handrails are loose or broken. Make sure that both sides of any steps have handrails. Any raised decks and porches should have guardrails on the edges. Have any leaves, snow, or ice cleared regularly. Use sand or salt on walking paths during winter. Clean up any spills in your garage right away. This includes oil or grease spills. What can I do in the bathroom? Use night lights. Install grab bars by the toilet and in the tub and shower. Do not use towel bars as grab bars. Use non-skid mats or decals in the tub or shower. If you need to sit down in the shower, use a plastic, non-slip stool. Keep the floor dry. Clean up any water that spills on the floor as soon as it happens. Remove soap buildup in  the tub or shower regularly. Attach bath mats securely with double-sided non-slip rug tape. Do not have throw rugs and other things on the floor that can make you trip. What can I do in the bedroom? Use night lights. Make sure that you have a light by your bed that is easy to reach. Do not use any sheets or blankets that are too big for your bed. They should not hang down onto the floor. Have a firm chair that has side arms. You can use this for support while you get dressed. Do not have throw rugs and other things on the floor that can  make you trip. What can I do in the kitchen? Clean up any spills right away. Avoid walking on wet floors. Keep items that you use a lot in easy-to-reach places. If you need to reach something above you, use a strong step stool that has a grab bar. Keep electrical cords out of the way. Do not use floor polish or wax that makes floors slippery. If you must use wax, use non-skid floor wax. Do not have throw rugs and other things on the floor that can make you trip. What can I do with my stairs? Do not leave any items on the stairs. Make sure that there are handrails on both sides of the stairs and use them. Fix handrails that are broken or loose. Make sure that handrails are as long as the stairways. Check any carpeting to make sure that it is firmly attached to the stairs. Fix any carpet that is loose or worn. Avoid having throw rugs at the top or bottom of the stairs. If you do have throw rugs, attach them to the floor with carpet tape. Make sure that you have a light switch at the top of the stairs and the bottom of the stairs. If you do not have them, ask someone to add them for you. What else can I do to help prevent falls? Wear shoes that: Do not have high heels. Have rubber bottoms. Are comfortable and fit you well. Are closed at the toe. Do not wear sandals. If you use a stepladder: Make sure that it is fully opened. Do not climb a closed stepladder. Make sure that both sides of the stepladder are locked into place. Ask someone to hold it for you, if possible. Clearly mark and make sure that you can see: Any grab bars or handrails. First and last steps. Where the edge of each step is. Use tools that help you move around (mobility aids) if they are needed. These include: Canes. Walkers. Scooters. Crutches. Turn on the lights when you go into a dark area. Replace any light bulbs as soon as they burn out. Set up your furniture so you have a clear path. Avoid moving your furniture  around. If any of your floors are uneven, fix them. If there are any pets around you, be aware of where they are. Review your medicines with your doctor. Some medicines can make you feel dizzy. This can increase your chance of falling. Ask your doctor what other things that you can do to help prevent falls. This information is not intended to replace advice given to you by your health care provider. Make sure you discuss any questions you have with your health care provider. Document Released: 10/05/2009 Document Revised: 05/16/2016 Document Reviewed: 01/13/2015 Elsevier Interactive Patient Education  2017 [employer].

## 2024-12-28 NOTE — Progress Notes (Signed)
 "  Chief Complaint  Patient presents with   Medicare Wellness     Subjective:   Tony Mcmillan is a 84 y.o. male who presents for a Medicare Annual Wellness Visit.  No voiced or noted concerns at this time   Visit info / Clinical Intake: Medicare Wellness Visit Type:: Subsequent Annual Wellness Visit Persons participating in visit and providing information:: patient Medicare Wellness Visit Mode:: In-person (required for WTM) Interpreter Needed?: No Pre-visit prep was completed: no AWV questionnaire completed by patient prior to visit?: yes Date:: 12/28/23 Living arrangements:: (!) lives alone Patient's Overall Health Status Rating: (!) fair Typical amount of pain: some Does pain affect daily life?: no Are you currently prescribed opioids?: no  Dietary Habits and Nutritional Risks How many meals a day?: 2 Eats fruit and vegetables daily?: (!) no Most meals are obtained by: preparing own meals In the last 2 weeks, have you had any of the following?: none Diabetic:: no  Functional Status Activities of Daily Living (to include ambulation/medication): Independent Ambulation: Independent with device- listed below Home Assistive Devices/Equipment: Cane Medication Administration: Independent Home Management (perform basic housework or laundry): Independent Manage your own finances?: yes Primary transportation is: driving Concerns about vision?: no *vision screening is required for WTM* Concerns about hearing?: no  Fall Screening Falls in the past year?: 0 Number of falls in past year: 0 Was there an injury with Fall?: 0 Fall Risk Category Calculator: 0 Patient Fall Risk Level: Low Fall Risk  Fall Risk Patient at Risk for Falls Due to: Impaired mobility; Impaired balance/gait Fall risk Follow up: Falls evaluation completed; Education provided; Falls prevention discussed  Home and Transportation Safety: All rugs have non-skid backing?: N/A, no rugs All stairs or steps have  railings?: N/A, no stairs Grab bars in the bathtub or shower?: (!) no Have non-skid surface in bathtub or shower?: (!) no Good home lighting?: yes Regular seat belt use?: yes Hospital stays in the last year:: no  Cognitive Assessment Difficulty concentrating, remembering, or making decisions? : no Will 6CIT or Mini Cog be Completed: yes What year is it?: 0 points What month is it?: 0 points Give patient an address phrase to remember (5 components): Its very sunny outside today in January About what time is it?: 0 points Count backwards from 20 to 1: 0 points Say the months of the year in reverse: 4 points Repeat the address phrase from earlier: 2 points 6 CIT Score: 6 points  Advance Directives (For Healthcare) Does Patient Have a Medical Advance Directive?: No Does patient want to make changes to medical advance directive?: No - Patient declined Type of Advance Directive: Out of facility DNR (pink MOST or yellow form) Out of facility DNR (pink MOST or yellow form) in Chart? (Ambulatory ONLY): Yes - validated most recent copy scanned in chart Would patient like information on creating a medical advance directive?: No - Patient declined  Reviewed/Updated  Reviewed/Updated: Reviewed All (Medical, Surgical, Family, Medications, Allergies, Care Teams, Patient Goals); Surgical History; Family History; Medications; Allergies; Care Teams; Patient Goals; Medical History    Allergies (verified) Patient has no known allergies.   Current Medications (verified) Outpatient Encounter Medications as of 12/28/2024  Medication Sig   albuterol  (VENTOLIN  HFA) 108 (90 Base) MCG/ACT inhaler Inhale 2 puffs into the lungs every 6 (six) hours as needed for wheezing or shortness of breath.   aspirin  EC (ASPIRIN  LOW DOSE) 81 MG tablet TAKE 1 TABLET BY MOUTH DAILY *SWALLOW WHOLE*   atorvastatin  (LIPITOR) 80  MG tablet Take 1 tablet (80 mg total) by mouth daily.   Calcium  Carb-Cholecalciferol  (CALCIUM -VITAMIN D3) 600-400 MG-UNIT TABS Take 1 tablet by mouth 2 (two) times daily.   docusate sodium (COLACE) 250 MG capsule Take 250 mg by mouth daily.   ezetimibe  (ZETIA ) 10 MG tablet Take 1 tablet (10 mg total) by mouth daily.   ferrous sulfate  325 (65 FE) MG EC tablet Take 325 mg by mouth daily with breakfast.   Glycopyrrolate -Formoterol  (BEVESPI  AEROSPHERE) 9-4.8 MCG/ACT AERO Inhale 2 puffs into the lungs in the morning and at bedtime.   isosorbide  mononitrate (IMDUR ) 30 MG 24 hr tablet Take 1.5 tablets (45 mg total) by mouth daily.   latanoprost (XALATAN) 0.005 % ophthalmic solution Place 1 drop into both eyes at bedtime.   leflunomide (ARAVA) 20 MG tablet Take 20 mg by mouth daily.   MYRBETRIQ 50 MG TB24 tablet Take 50 mg by mouth daily.    nitroGLYCERIN  (NITROSTAT ) 0.4 MG SL tablet PLACE 1 TABLET UNDER THE TONGUE EVERY 5 MINUTES AS NEEDED FOR CHEST PAIN.   pantoprazole  (PROTONIX ) 40 MG tablet Take 1 tablet (40 mg total) by mouth daily.   polyethylene glycol (MIRALAX ) 17 g packet Take 17 g by mouth daily as needed.   REMICADE 100 MG injection    gabapentin  (NEURONTIN ) 100 MG capsule TAKE 2 CAPSULES BY MOUTH AT BEDTIME.   metoprolol  succinate (TOPROL -XL) 25 MG 24 hr tablet Take 1 tablet (25 mg total) by mouth daily.   No facility-administered encounter medications on file as of 12/28/2024.    History: Past Medical History:  Diagnosis Date   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 [facility].     Atypical chest pain    a. 09/2016 MV: EF 63%, normal perfusion.   Benign prostatic hyperplasia 04/03/2016   Asymptomatic. Normal PSA and exam by Dr. [facility] 01/2016.     Bilateral carotid bruits 02/09/2019   Bladder wall thickening 03/10/2020   CAD (coronary artery disease)    Coronary artery calcification  on Chest CT in 09/2016 and 01/2019 // Myoview  10/17: normal perfusion // Myoview  01/2019: no ischemia or infarction, EF 58 // Coronary CTA 03/2020: Calcium  Score 1067; non-obstructive CAD [oLM 1-24, LAD ost 25049, mid and dist 1-24; D1 1-24; LCx prox and mid 1-24, OM1 and OM2 25-49, RCA and PLB 1-24; PLA 1-24]    Carotid bruit    Carotid Dopplers 01/2019: no ICA stenosis bilaterally    Chronic wrist pain 07/02/2016   Colonic polyp 02/23/2016   COVID-19    DDD (degenerative disc disease), 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 L4-5 and L5-S1    Diastolic dysfunction    a. 2015 Echo: EF >55%;  b. 09/2016 Echo: EF 55-60%, no rwma, Gr1 DD, triv AI/MR, mild TR, PASP .   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   On  CT 01/2016    Palpitations    Echo 10/17: Mild focal basal septal hypertrophy, EF 55-60, no RWMA, Gr 1 DD, trivial MR, normal RVSF, mild TR, PASP 28 // Event monitor 01/2019: NSR, PACs   Prediabetes 10/29/2018   Pulmonary emphysema (HCC) 12/18/2016   Renal cyst, left 04/03/2016   On CT, benign    Rheumatoid arthritis (HCC)    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 this was in [place]   CARPAL TUNNEL RELEASE Left 09/29/2023   CARPAL TUNNEL  RELEASE Left    CATARACT EXTRACTION     both eyes   COLONOSCOPY     COLONOSCOPY N/A 06/17/2024   Procedure: COLONOSCOPY;  Surgeon: San Sandor GAILS, DO;  Location: [facility];  Service: Gastroenterology;  Laterality: N/A;   COLOSTOMY REVERSAL     had infection on buttocks and had to have a skin graft- had colostomy to help area stay clean and heal   ESOPHAGOGASTRODUODENOSCOPY N/A 06/17/2024   Procedure: EGD (ESOPHAGOGASTRODUODENOSCOPY);  Surgeon: San Sandor GAILS, DO;  Location: [facility] ENDOSCOPY;  Service: Gastroenterology;  Laterality: N/A;   HERNIA REPAIR     POLYPECTOMY     SPINE SURGERY     Family History  Problem Relation Age of Onset   Hypertension Mother    Hypertension Father    Diabetes Father    Heart disease Father        stents   Hyperlipidemia Sister    Diabetes Sister    Stroke Brother    Diabetes Brother    Heart disease Brother        stents   Colon polyps Neg Hx    Esophageal cancer Neg Hx    Stomach cancer Neg Hx    Rectal cancer Neg Hx    Colon cancer Neg Hx    Social History   Occupational History   Not on file  Tobacco Use   Smoking status: Former    Current packs/day: 0.00    Average packs/day: 1 pack/day for 17.0 years (17.0 ttl pk-yrs)    Types: Cigarettes    Start date: 12/23/1976    Quit date: 12/23/1993    Years since quitting: 31.0   Smokeless tobacco: Never  Vaping Use   Vaping status: Never Used  Substance and Sexual Activity   Alcohol use: No    Alcohol/week: 0.0 standard drinks of alcohol   Drug use: No   Sexual activity: Yes   Tobacco Counseling Counseling given: Not Answered  SDOH Screenings   Food Insecurity: No Food Insecurity (12/28/2024)  Housing: Unknown (12/28/2024)  Transportation Needs: No Transportation Needs (12/28/2024)  Utilities: Not At Risk (12/28/2024)  Alcohol Screen: Low Risk (12/16/2023)  Depression (PHQ2-9): Low Risk (12/28/2024)  Financial Resource Strain: Low Risk (12/16/2023)  Physical Activity: Insufficiently  Active (12/28/2024)  Social Connections: Moderately Isolated (12/28/2024)  Stress: No Stress Concern Present (12/28/2024)  Tobacco Use: Medium Risk (12/28/2024)  Health Literacy: Adequate Health Literacy (12/28/2024)   See flowsheets for full screening details  Depression Screen PHQ 2 & 9 Depression Scale- Over the past 2 weeks, how often have you been bothered by any of the following problems? Little interest or pleasure in doing things: 0 Feeling down, depressed, or hopeless (PHQ  Adolescent also includes...irritable): 0 PHQ-2 Total Score: 0 Trouble falling or staying asleep, or sleeping too much: 0 Feeling tired or having little energy: 0 Poor appetite or overeating (PHQ Adolescent also includes...weight loss): 0 Feeling bad about yourself - or that you are a failure or have let yourself or your family down: 0 Trouble concentrating on things, such as reading the newspaper or watching television (PHQ Adolescent also includes...like school work): 0 Moving or speaking so slowly that other people could have noticed. Or the opposite - being so fidgety or restless that you have been moving around a lot more than usual: 0 PHQ-9 Total Score: 0 If you checked off any problems, how difficult have these problems made it for you to do your work, take care of things at home, or get along with other people?: Not difficult at all     Goals Addressed             This Visit's Progress    Patient Stated       Stay healthy and independent             Objective:    Today's Vitals   12/28/24 1137  BP: 118/74  Weight: 131 lb (59.4 kg)   Body mass index is 23.96 kg/m.  Hearing/Vision screen Hearing Screening - Comments:: No trouble hearing Vision Screening - Comments:: [place] eye Up to date Immunizations and Health Maintenance Health Maintenance  Topic Date Due   COVID-19 Vaccine (7 - 2025-26 season) 11/20/2024   DTaP/Tdap/Td (2 - Td or Tdap) 12/08/2025   Medicare Annual Wellness (AWV)   12/28/2025   Pneumococcal Vaccine: 50+ Years  Completed   Influenza Vaccine  Completed   Zoster Vaccines- Shingrix  Completed   Meningococcal B Vaccine  Aged Out   Colonoscopy  Discontinued   Hepatitis C Screening  Discontinued        Assessment/Plan:  This is a routine wellness examination for [facility].  Patient Care Team: Early, Camie BRAVO, NP as PCP - General (Nurse Practitioner) Jeffrie Oneil BROCKS, MD as PCP - Cardiology (Cardiology) Lelon Glendia ONEIDA DEVONNA as Physician Assistant (Cardiology) Geronimo Amel, MD as Consulting Physician (Pulmonary Disease) Renda Glance, MD as Consulting Physician (Urology) Mai Lynwood FALCON, MD as Consulting Physician (Rheumatology)  I have personally reviewed and noted the following in the patients chart:   Medical and social history Use of alcohol, tobacco or illicit drugs  Current medications and supplements including opioid prescriptions. Functional ability and status Nutritional status Physical activity Advanced directives List of other physicians Hospitalizations, surgeries, and ER visits in previous 12 months Vitals Screenings to include cognitive, depression, and falls Referrals and appointments  No orders of the defined types were placed in this encounter.  In addition, I have reviewed and discussed with patient certain preventive protocols, quality metrics, and best practice recommendations. A written personalized care plan for preventive services as well as general preventive health recommendations were provided to patient.   Mliss Graff, LPN   07/25/7972   Return in 1 year (on 12/28/2025).  After Visit Summary: (MyChart) Due to this being a telephonic visit, the after visit summary with patients personalized plan was offered to patient via MyChart   Nurse Notes:  "

## 2025-01-07 LAB — ALT: ALT: 16 IU/L (ref 0–44)

## 2025-01-07 LAB — LIPID PANEL
Chol/HDL Ratio: 2.4 ratio (ref 0.0–5.0)
Cholesterol, Total: 140 mg/dL (ref 100–199)
HDL: 59 mg/dL
LDL Chol Calc (NIH): 57 mg/dL (ref 0–99)
Triglycerides: 137 mg/dL (ref 0–149)
VLDL Cholesterol Cal: 24 mg/dL (ref 5–40)

## 2025-03-01 ENCOUNTER — Ambulatory Visit: Admitting: Physician Assistant

## 2025-03-09 ENCOUNTER — Ambulatory Visit: Admitting: Orthopedic Surgery
